# Patient Record
Sex: Female | Born: 1935
Health system: Southern US, Community
[De-identification: ages and names within clinical notes are randomized; demographics above are authoritative.]

## PROBLEM LIST (undated history)

## (undated) DIAGNOSIS — T148XXA Other injury of unspecified body region, initial encounter: Secondary | ICD-10-CM

## (undated) DIAGNOSIS — K219 Gastro-esophageal reflux disease without esophagitis: Secondary | ICD-10-CM

## (undated) DIAGNOSIS — S42002A Fracture of unspecified part of left clavicle, initial encounter for closed fracture: Secondary | ICD-10-CM

## (undated) DIAGNOSIS — J8410 Pulmonary fibrosis, unspecified: Secondary | ICD-10-CM

## (undated) DIAGNOSIS — I10 Essential (primary) hypertension: Secondary | ICD-10-CM

## (undated) HISTORY — PX: OTHER SURGICAL HISTORY: SHX169

## (undated) HISTORY — DX: Essential (primary) hypertension: I10

## (undated) HISTORY — PX: FEMUR FRACTURE SURGERY: SHX633

## (undated) HISTORY — PX: HIP FRACTURE SURGERY: SHX118

## (undated) HISTORY — PX: HIP SURGERY: SHX245

---

## 1998-10-09 ENCOUNTER — Other Ambulatory Visit: Admission: RE | Admit: 1998-10-09 | Discharge: 1998-10-09 | Payer: Self-pay | Admitting: Obstetrics and Gynecology

## 1998-11-01 ENCOUNTER — Encounter: Payer: Self-pay | Admitting: Obstetrics and Gynecology

## 1998-11-01 ENCOUNTER — Ambulatory Visit (HOSPITAL_COMMUNITY): Admission: RE | Admit: 1998-11-01 | Discharge: 1998-11-01 | Payer: Self-pay | Admitting: Obstetrics and Gynecology

## 1999-11-19 ENCOUNTER — Other Ambulatory Visit: Admission: RE | Admit: 1999-11-19 | Discharge: 1999-11-19 | Payer: Self-pay | Admitting: Obstetrics and Gynecology

## 2000-07-31 ENCOUNTER — Encounter: Payer: Self-pay | Admitting: Obstetrics and Gynecology

## 2000-07-31 ENCOUNTER — Ambulatory Visit (HOSPITAL_COMMUNITY): Admission: RE | Admit: 2000-07-31 | Discharge: 2000-07-31 | Payer: Self-pay | Admitting: Obstetrics and Gynecology

## 2000-08-14 ENCOUNTER — Encounter: Payer: Self-pay | Admitting: Orthopaedic Surgery

## 2000-08-14 ENCOUNTER — Encounter: Admission: RE | Admit: 2000-08-14 | Discharge: 2000-08-14 | Payer: Self-pay | Admitting: Orthopaedic Surgery

## 2001-02-05 ENCOUNTER — Other Ambulatory Visit: Admission: RE | Admit: 2001-02-05 | Discharge: 2001-02-05 | Payer: Self-pay | Admitting: Obstetrics and Gynecology

## 2002-10-20 ENCOUNTER — Other Ambulatory Visit: Admission: RE | Admit: 2002-10-20 | Discharge: 2002-10-20 | Payer: Self-pay | Admitting: *Deleted

## 2002-10-21 ENCOUNTER — Ambulatory Visit (HOSPITAL_COMMUNITY): Admission: RE | Admit: 2002-10-21 | Discharge: 2002-10-21 | Payer: Self-pay | Admitting: *Deleted

## 2003-07-11 ENCOUNTER — Encounter (INDEPENDENT_AMBULATORY_CARE_PROVIDER_SITE_OTHER): Payer: Self-pay | Admitting: Specialist

## 2003-07-11 ENCOUNTER — Ambulatory Visit (HOSPITAL_COMMUNITY): Admission: RE | Admit: 2003-07-11 | Discharge: 2003-07-11 | Payer: Self-pay | Admitting: Gastroenterology

## 2005-05-02 ENCOUNTER — Ambulatory Visit: Payer: Self-pay | Admitting: Hematology & Oncology

## 2005-07-10 ENCOUNTER — Ambulatory Visit: Payer: Self-pay | Admitting: Hematology & Oncology

## 2005-09-02 ENCOUNTER — Ambulatory Visit: Payer: Self-pay | Admitting: Hematology & Oncology

## 2005-11-19 ENCOUNTER — Ambulatory Visit (HOSPITAL_COMMUNITY): Admission: RE | Admit: 2005-11-19 | Discharge: 2005-11-19 | Payer: Self-pay | Admitting: Internal Medicine

## 2005-12-10 ENCOUNTER — Ambulatory Visit: Payer: Self-pay | Admitting: Hematology & Oncology

## 2005-12-10 ENCOUNTER — Ambulatory Visit (HOSPITAL_COMMUNITY): Admission: RE | Admit: 2005-12-10 | Discharge: 2005-12-10 | Payer: Self-pay | Admitting: Hematology & Oncology

## 2005-12-19 ENCOUNTER — Encounter: Admission: RE | Admit: 2005-12-19 | Discharge: 2005-12-19 | Payer: Self-pay | Admitting: Internal Medicine

## 2005-12-31 ENCOUNTER — Ambulatory Visit (HOSPITAL_COMMUNITY): Admission: RE | Admit: 2005-12-31 | Discharge: 2005-12-31 | Payer: Self-pay | Admitting: Internal Medicine

## 2006-01-06 ENCOUNTER — Encounter: Admission: RE | Admit: 2006-01-06 | Discharge: 2006-01-06 | Payer: Self-pay | Admitting: Internal Medicine

## 2006-07-17 ENCOUNTER — Encounter: Admission: RE | Admit: 2006-07-17 | Discharge: 2006-07-17 | Payer: Self-pay | Admitting: Internal Medicine

## 2006-09-08 ENCOUNTER — Ambulatory Visit: Payer: Self-pay | Admitting: Pulmonary Disease

## 2006-10-08 ENCOUNTER — Ambulatory Visit: Payer: Self-pay | Admitting: Pulmonary Disease

## 2006-11-19 ENCOUNTER — Ambulatory Visit: Payer: Self-pay | Admitting: Pulmonary Disease

## 2006-12-31 ENCOUNTER — Ambulatory Visit: Payer: Self-pay | Admitting: Pulmonary Disease

## 2006-12-31 ENCOUNTER — Ambulatory Visit: Payer: Self-pay | Admitting: Cardiology

## 2007-03-03 ENCOUNTER — Encounter: Admission: RE | Admit: 2007-03-03 | Discharge: 2007-03-03 | Payer: Self-pay | Admitting: Internal Medicine

## 2007-07-14 ENCOUNTER — Ambulatory Visit (HOSPITAL_COMMUNITY): Admission: RE | Admit: 2007-07-14 | Discharge: 2007-07-14 | Payer: Self-pay | Admitting: Internal Medicine

## 2007-11-12 ENCOUNTER — Encounter: Payer: Self-pay | Admitting: Pulmonary Disease

## 2007-11-12 DIAGNOSIS — E119 Type 2 diabetes mellitus without complications: Secondary | ICD-10-CM | POA: Insufficient documentation

## 2007-11-12 DIAGNOSIS — E1159 Type 2 diabetes mellitus with other circulatory complications: Secondary | ICD-10-CM | POA: Insufficient documentation

## 2007-11-12 DIAGNOSIS — I152 Hypertension secondary to endocrine disorders: Secondary | ICD-10-CM | POA: Insufficient documentation

## 2007-11-12 DIAGNOSIS — I10 Essential (primary) hypertension: Secondary | ICD-10-CM | POA: Insufficient documentation

## 2008-12-22 ENCOUNTER — Encounter: Admission: RE | Admit: 2008-12-22 | Discharge: 2008-12-22 | Payer: Self-pay | Admitting: Internal Medicine

## 2009-01-19 ENCOUNTER — Ambulatory Visit (HOSPITAL_COMMUNITY): Admission: RE | Admit: 2009-01-19 | Discharge: 2009-01-19 | Payer: Self-pay | Admitting: Internal Medicine

## 2010-02-05 ENCOUNTER — Ambulatory Visit (HOSPITAL_COMMUNITY): Admission: RE | Admit: 2010-02-05 | Discharge: 2010-02-05 | Payer: Self-pay | Admitting: Internal Medicine

## 2011-04-18 NOTE — Assessment & Plan Note (Signed)
[facility]                               PULMONARY OFFICE NOTE   SHEENAH, DIMITROFF                    MRN:          [id number]  DATE:10/08/2006                            DOB:          1927-11-25    SUBJECTIVE:  Ms. Gitto comes in today for followup of her cough and also  her mild interstitial lung disease.  The patient states that her cough is  greater than 50% improved since being on b.i.d. proton pump inhibitor.  She  does still have it at times.  I have also gone over the patient's recent  blood work where her ANA, rheumatoid factor and sed rate were all  unremarkable.   PHYSICAL EXAMINATION:  BP is 106/70.  Pulse 88.  Temperature 98.1.  Weight  is 150 pounds.  02 saturation on room air is 94%.  GENERAL:  She is a well-developed, white female, in no acute distress.   IMPRESSION:  1. Cough that is most likely due to occult laryngeal pharyngeal reflux and      some component of Cyclical mechanism.  Patient is clearly improved      greater than 50% since being on b.i.d. proton pump inhibitor.  I have      told her that I would like to continue this for another 1 to 2 months,      and if she is doing well, we will decrease her down to once a day on      the proton pump inhibitor.  2. Mild interstitial lung disease manifesting a subpleural fibrosis on CT      of the chest in January 2007.  This will need follow up in January of      2008 along with PFT's.   PLAN:  1. Stay on Protonix b.i.d. for now.  The patient will follow up with me in      6 weeks.  If she is doing well at that time with resolution of her      cough, I will decrease her q.d. dosing.  2. We will schedule at the next visit her CT scan and PFT's for January of      next year for followup of her interstitial lung disease.    ______________________________  Barbaraann Share, MD,FCCP    [facility]  DD: 10/08/2006  DT: 10/08/2006  Job #: [id number]   cc:   Gaspar Garbe, M.D.

## 2011-04-18 NOTE — Assessment & Plan Note (Signed)
[facility]                               PULMONARY OFFICE NOTE   Lauren, Avery                    MRN:          [id number]  DATE:09/08/2006                            DOB:          06-18-1927    PULMONARY CONSULTATION:   HISTORY OF PRESENT ILLNESS:  Patient is a 77 year old female whom I have  been asked to see for cough and also an abnormal chest CT.  Patient states  that she has had a dry cough over the last 9 to 12 months that occurs off  and on all day.  Patient states again that it is dry in nature with rare  scant white mucus.  She states that stimulants for her cough seems to be a  tickle in her throat area.  The patient states that it does not wake her up  during the night and it is no worse with lying down.  It may be worse with  eating but she is unsure of this.  It is clearly worse with singing, talking  and can often lead to cough paroxysms.  It is better if she keeps a cough  drop in her mouth.  She denies a lot of throat clearing.  Patient denies any  post nasal drips or difficulties with allergies.  She also denies classic  heartburn but does get epigastric burning at times.  She denies any  hoarseness.  She has had no shortness of breath with activities of daily  living and can walk 1 mile and bike 3 miles with no difficulties.  The  patient has no history of connective tissue diseases and only complains of  some mild arthritis changes in her hands.  She has had a CT scan of the  chest on December 18, 2005 that does show some very mild subpleural fibrotic  changes.  There is no ground glass densities or significant lymphadenopathy.   PAST MEDICAL HISTORY:  Is significant for hypertension.  She has been on  Verapamil in the past but now she has discontinued this on her own; also  diabetes for which she is on no medications.   CURRENT MEDICATIONS:  Consist of vitamins only at this time.   She has no known drug  allergies.   SOCIAL HISTORY:  She is married, she has never smoked.  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   FAMILY HISTORY:  Is remarkable for brother having had pancreatic cancer.   REVIEW OF SYSTEMS:  As per history of present illness.  Also patient intake  form in the back of the chart.   PHYSICAL EXAMINATION:  GENERAL:  She is a well-developed female in no acute  distress.  Blood pressure is 134/62, pulse 83.  Temperature is 97.5, weight is 151  pounds.  O2 saturation on room air is 94%.  HEENT:  Pupils equal, round and reactive to light and accommodation.  Extraocular muscles are intact.  Nares are patent without discharge.  Oropharynx is clear.  NECK:  Is supple without JVD or lymphadenopathy.  There is no palpable  thyromegaly.  CHEST:  Reveals bibasilar crackles.  CARDIAC:  Reveals regular rate and rhythm without murmurs, rubs or gallops.  ABDOMEN:  Soft, nontender with good bowel sounds.  RECTAL EXAM & BREAST EXAM:  Was not done and not indicated.  LOWER EXTREMITIES:  Are without edema.  Good pulses distally.  No calf  tenderness.  NEUROLOGICALLY:  Alert and oriented with no obvious motor deficits.   IMPRESSION:  1. Cough that sounds more upper airway in origin than lower airway.  She      definitely has an upper airway dysfunction-like description to her      symptoms.  It is unclear how much of this could be due to occult      laryngopharyngeal reflux, and it does not sound like it is related to      postnasal drip or allergic disease.  I think at this point in time I      would treat her empirically for LPR and also place her on a modified      cyclical cough regimen with voice rest, no throat clearing, and may      have to consider aggressive cough suppression with narcotics cough      syrup.  I do not think her cough is due to her fibrotic changes noted      on CT scan.  2. Subpleural fibrosis noted on CT that has the appearance of early usual       interstitial pneumonitis.  I will go ahead and check some screening      connective tissue labs.  If this is idiopathic pulmonary fibrosis,      unfortunately there is very little successful treatment.  I think this      needs to be closely monitored for worsening.   PLAN:  1. Modified cyclical cough protocol.  2. Protonix 40 mg p.o. b.i.d. as a trial.  3. Will check ANA, rheumatoid factor and sed rate.  4. Patient will need a follow up CT and also full pulmonary function tests      in January 2008.  Her last pulmonary function tests in January of last      year showed no obstruction or restriction and only a mild decrease in      DLCO.  5. The patient will follow up in approximately 4 to 6 weeks or sooner if      there is problems.    ______________________________  Barbaraann Share, MD,FCCP    [facility]  DD: 09/29/2006  DT: 09/29/2006  Job #: [id number]   cc:   Gaspar Garbe, M.D.

## 2011-04-18 NOTE — Op Note (Signed)
   NAME:  Lauren Avery, Lauren Avery                       ACCOUNT NO.:  [id number]   MEDICAL RECORD NO.:  [id number]                   PATIENT TYPE:  AMB   LOCATION:  ENDO                                 FACILITY:  [facility]   PHYSICIAN:  Graylin Shiver, M.D.                DATE OF BIRTH:  1927/09/15   DATE OF PROCEDURE:  07/11/2003  DATE OF DISCHARGE:                                 OPERATIVE REPORT   PROCEDURE:  Colonoscopy with biopsy.   INDICATIONS FOR PROCEDURE:  Rectal bleeding.   Informed consent was obtained after explanation of the risks of bleeding,  infection and perforation.   PREMEDICATION:  Fentanyl 80 mcg IV, Versed 8 mg IV   PROCEDURE:  With the patient in the left lateral decubitus position, a  rectal exam was performed and no masses were felt.  The Olympus pediatric  colonoscope was inserted into the rectum and advanced into the sigmoid  colon.  The sigmoid was quite tortuous.  The scope was able to be advanced  beyond the sigmoid and then around the colon to the cecum.  Cecal landmarks  were identified.  At the base of the cecum on a fold, there was a linear  ulcer and biopsies were obtained.  No other abnormalities were noted in the  cecum.  The ascending colon was normal.  The transverse colon was normal.  The descending colon and sigmoid revealed some scattered diverticula.  Rectum was normal.  She tolerated the procedure well without complications.   IMPRESSION:  1. Linear ulcer in the cecum.  This was biopsied.  2. Diverticulosis of the left colon.   COMMENTS:  The patient's recent rectal bleeding could have been from the  ulcer in the cecum or from diverticulosis.  There is no evidence of active  significant bleeding at this time.  The biopsies will be checked.  The  patient has not reported any further bleeding since her initial episode and  hopefully there will be no further bleeding.                                               Graylin Shiver, M.D.    Germain Osgood  D:  07/11/2003  T:  07/11/2003  Job:  [id number]   cc:   Gwen Pounds, M.D.  [address]  [place]  [place] [address]  Fax: [phone]

## 2012-01-08 ENCOUNTER — Other Ambulatory Visit (HOSPITAL_COMMUNITY): Payer: Self-pay | Admitting: *Deleted

## 2012-01-12 ENCOUNTER — Ambulatory Visit (HOSPITAL_COMMUNITY)
Admission: RE | Admit: 2012-01-12 | Discharge: 2012-01-12 | Disposition: A | Discharge status: Home / Self Care | Payer: Medicare Other | Source: Ambulatory Visit | Attending: Internal Medicine | Admitting: Internal Medicine

## 2012-01-12 ENCOUNTER — Encounter (HOSPITAL_COMMUNITY): Payer: Self-pay

## 2012-01-12 DIAGNOSIS — M810 Age-related osteoporosis without current pathological fracture: Secondary | ICD-10-CM | POA: Insufficient documentation

## 2012-01-12 HISTORY — DX: Fracture of unspecified part of left clavicle, initial encounter for closed fracture: S42.002A

## 2012-01-12 HISTORY — DX: Pulmonary fibrosis, unspecified: J84.10

## 2012-01-12 HISTORY — DX: Gastro-esophageal reflux disease without esophagitis: K21.9

## 2012-01-12 MED ORDER — SODIUM CHLORIDE 0.9 % IV SOLN
INTRAVENOUS | Status: AC
  Administered 2012-01-12: 13:00:00 via INTRAVENOUS

## 2012-01-12 MED ORDER — ZOLEDRONIC ACID 5 MG/100ML IV SOLN
5.0000 mg | Freq: Once | INTRAVENOUS | Status: AC
  Administered 2012-01-12: 5 mg via INTRAVENOUS
  Filled 2012-01-12: qty 100

## 2012-02-24 ENCOUNTER — Encounter: Payer: Self-pay | Admitting: Pulmonary Disease

## 2012-02-24 ENCOUNTER — Other Ambulatory Visit (INDEPENDENT_AMBULATORY_CARE_PROVIDER_SITE_OTHER): Payer: Medicare Other

## 2012-02-24 ENCOUNTER — Ambulatory Visit (INDEPENDENT_AMBULATORY_CARE_PROVIDER_SITE_OTHER): Payer: Medicare Other | Admitting: Pulmonary Disease

## 2012-02-24 DIAGNOSIS — R05 Cough: Secondary | ICD-10-CM

## 2012-02-24 DIAGNOSIS — J84112 Idiopathic pulmonary fibrosis: Secondary | ICD-10-CM | POA: Insufficient documentation

## 2012-02-24 DIAGNOSIS — R053 Chronic cough: Secondary | ICD-10-CM | POA: Insufficient documentation

## 2012-02-24 DIAGNOSIS — R059 Cough, unspecified: Secondary | ICD-10-CM

## 2012-02-24 LAB — SEDIMENTATION RATE: Sed Rate: 29 mm/hr — ABNORMAL HIGH (ref 0–22)

## 2012-02-24 LAB — CK: Total CK: 34 U/L (ref 7–177)

## 2012-02-24 MED ORDER — OMEPRAZOLE 40 MG PO CPDR: 40.0000 mg | DELAYED_RELEASE_CAPSULE | Freq: Every morning | ORAL | Status: DC

## 2012-02-24 MED ORDER — BENZONATATE 100 MG PO CAPS: 200.0000 mg | ORAL_CAPSULE | Freq: Four times a day (QID) | ORAL | Status: AC | PRN

## 2012-02-24 NOTE — Assessment & Plan Note (Signed)
The patient has a chronic cough of at least 5 years duration, and it is unclear from her history how much of that is due to her upper airway versus her known interstitial lung disease.  She tells me that she has definite reflux, and is unsure if she has postnasal drip.  Her history also suggest a component of cyclical coughing.  I would like to treat for laryngopharyngeal reflux, postnasal drip, and also tried behavioral therapy for cyclical coughing.  In the end, if this is primarily due to her interstitial lung disease, there is not a lot we can do other than cough suppression.

## 2012-02-24 NOTE — Assessment & Plan Note (Signed)
The patient's CAT scan appearance is most consistent with usual interstitial pneumonitis.  This can be related to an autoimmune process, or idiopathic pulmonary fibrosis.  The only history she has that may suggest an autoimmune disease is her arthritis, but more than likely her fibrosis will be idiopathic.  Will send an autoimmune panel, and also recheck pulmonary function studies to compare to 2007.  I have had a long discussion with her about interstitial lung disease, especially idiopathic pulmonary fibrosis.  I have explained to her there is no treatment currently for this disease except lung transplantation.  If there is a related autoimmune process, the disease carries a better prognosis and may be treatable.

## 2012-02-24 NOTE — Patient Instructions (Addendum)
Stop all inhalers. Start omeprazole 40mg  one each am before breakfast for possible acid reflux Take zyrtec 10mg  otc each night at bedtime Can use tessalon pearls 2 every 6hrs if needed for cough NO THROAT CLEARING, use hard candy to bathe back of throat if it tickles. Will schedule for breathing studies, and see you back same day for review (in 2-3 weeks) Will send bloodwork to look for autoimmune disease.

## 2012-02-24 NOTE — Progress Notes (Signed)
  Subjective:    Patient ID: Lauren Avery, female    DOB: [birth year], 79 y.o.   MRN: [id number]  HPI The patient is a very pleasant 78 year old female who I've been asked to see for an abnormal CT chest.  The patient has a history of cough for at least 5 years, and was also noted to have interstitial lung disease in the past.  Her cough has been getting worse, and she is been treated with various cough syrups and inhalers without improvement.  It is primarily dry in nature, and can occasionally result in cough paroxysms.  She does have postnasal drip as well as significant reflux symptoms.  She has noticed that her breathing has been getting worse, and will get winded bringing groceries in from the car or walking up one flight of stairs slowly.  She'll also get short of breath doing light housework.  The patient has had a chest x-ray which showed worsening interstitial disease from 2007, and this was followed by a CT chest which shows classic changes for usual interstitial pneumonitis.  She has subpleural fibrosis worse in the bases, honeycombing, and no groundglass opacities.  She did have very small nodules of unknown significance. The patient denies any occupational exposures, nor unusual pets or hobbies.  She has never been on Macrodantin for a prolonged period of time or amiodarone.  She has arthritic changes in her hands, but denies any known autoimmune disease.  She has no symptoms to suggest chronic aspiration.  The patient has never smoked.   Review of Systems  Constitutional: Negative for fever and unexpected weight change.  HENT: Positive for sneezing. Negative for ear pain, nosebleeds, congestion, sore throat, rhinorrhea, trouble swallowing, dental problem, postnasal drip and sinus pressure.   Eyes: Negative for redness and itching.  Respiratory: Positive for cough and shortness of breath. Negative for chest tightness and wheezing.   Cardiovascular: Negative for palpitations and leg  swelling.  Gastrointestinal: Negative for nausea and vomiting.  Genitourinary: Negative for dysuria.  Musculoskeletal: Positive for joint swelling.  Skin: Negative for rash.  Neurological: Negative for headaches.  Hematological: Does not bruise/bleed easily.  Psychiatric/Behavioral: Negative for dysphoric mood. The patient is not nervous/anxious.        Objective:   Physical Exam Constitutional:  Well developed, no acute distress  HENT:  Nares patent without discharge  Oropharynx without exudate, palate and uvula are normal  Eyes:  Perrla, eomi, no scleral icterus  Neck:  No JVD, no TMG  Cardiovascular:  Normal rate, regular rhythm, no rubs or gallops.  No murmurs        Intact distal pulses  Pulmonary :  Normal breath sounds, no stridor or respiratory distress   Crackles 1/3 up bilat, no wheezing  Abdominal:  Soft, nondistended, bowel sounds present.  No tenderness noted.   Musculoskeletal:  minimal lower extremity edema noted.  Lymph Nodes:  No cervical lymphadenopathy noted  Skin:  No cyanosis noted  Neurologic:  Alert, appropriate, moves all 4 extremities without obvious deficit.         Assessment & Plan:

## 2012-02-25 LAB — ANTI-NUCLEAR AB-TITER (ANA TITER): ANA Titer 1: NEGATIVE

## 2012-02-25 LAB — JO-1 ANTIBODY-IGG: Jo-1 Antibody, IgG: <1 AU/mL (ref <30)

## 2012-02-25 LAB — ANA: Anti Nuclear Antibody(ANA): POSITIVE — AB

## 2012-02-25 LAB — ANTI-RIBONUCLEIC ACID ANTIBODY: SM/RNP: <1 AU/mL (ref <30)

## 2012-02-25 LAB — C-REACTIVE PROTEIN: CRP: 0.37 mg/dL (ref <0.60)

## 2012-02-25 LAB — CYCLIC CITRUL PEPTIDE ANTIBODY, IGG: Cyclic Citrullin Peptide Ab: <2 U/mL (ref 0.0–5.0)

## 2012-02-25 LAB — RHEUMATOID FACTOR: Rhuematoid fact SerPl-aCnc: 19 IU/mL — ABNORMAL HIGH (ref <=14)

## 2012-02-25 LAB — ANTI-SCLERODERMA ANTIBODY: Scleroderma (Scl-70) (ENA) Antibody, IgG: 1 AU/mL (ref <30)

## 2012-03-16 ENCOUNTER — Ambulatory Visit (INDEPENDENT_AMBULATORY_CARE_PROVIDER_SITE_OTHER): Payer: Medicare Other | Admitting: Pulmonary Disease

## 2012-03-16 ENCOUNTER — Encounter: Payer: Self-pay | Admitting: Pulmonary Disease

## 2012-03-16 DIAGNOSIS — J84112 Idiopathic pulmonary fibrosis: Secondary | ICD-10-CM

## 2012-03-16 DIAGNOSIS — R059 Cough, unspecified: Secondary | ICD-10-CM

## 2012-03-16 DIAGNOSIS — R05 Cough: Secondary | ICD-10-CM

## 2012-03-16 DIAGNOSIS — R053 Chronic cough: Secondary | ICD-10-CM

## 2012-03-16 LAB — PULMONARY FUNCTION TEST

## 2012-03-16 MED ORDER — OMEPRAZOLE 20 MG PO CPDR: DELAYED_RELEASE_CAPSULE | ORAL | Status: DC

## 2012-03-16 NOTE — Assessment & Plan Note (Signed)
The patient's autoimmune workup was unrevealing, and therefore she is likely to have idiopathic pulmonary fibrosis.  Her PFTs today showed no obstruction, no restriction, and a moderate decrease in diffusion capacity.  Her lung volumes are basically unchanged from 2007, but her diffusion capacity has mild to moderately decreased since that time.  At this point, there is really nothing else to offer her treatment-wise, but I have encouraged her to work aggressively on some type of exercise program.  If she is not able to do it on her own at home, would refer to pulmonary rehabilitation at [facility].

## 2012-03-16 NOTE — Assessment & Plan Note (Signed)
The patient's cough is almost totally resolved with treatment of postnasal drip and acid reflux.  We'll try and cut back on some of her medication, but if the cough returns, she will need to go back to more aggressive treatment as outlined last visit.  I've also encouraged her not to clear her throat.

## 2012-03-16 NOTE — Patient Instructions (Signed)
Will decrease omeprazole to 20mg  each day.  Hopefully insurance will cover this. Take zyrtec for postnasal drip as needed rather than everyday. If your cough returns, would start by taking zyrtec everyday.  If it persists, may have to increase your reflux medication back to 40mg . Avoid throat clearing no matter what. Stay as active as possible, and consider exercise program.  Will refer to pulmonary rehab program [facility] if you wish to attend followup with me in 6mos, but call if your breathing is worsening.

## 2012-03-16 NOTE — Progress Notes (Signed)
  Subjective:    Patient ID: Lauren Avery, female    DOB: June 02, 1927, 78 y.o.   MRN: [id number]  HPI The patient comes in today for followup of her known interstitial lung disease.  She had an autoimmune workup at the last visit, and this was unrevealing.  She has just had pulmonary function studies, and when compared to 2007, shows stable lung volumes and mild to moderate worsening of her diffusion capacity.  I have reviewed the study with her in detail, and answered all of her questions.   Review of Systems  Constitutional: Negative.   HENT: Negative.   Eyes: Negative.   Respiratory: Positive for cough and shortness of breath.   Cardiovascular: Negative.   Gastrointestinal: Negative.   Genitourinary: Negative.   Musculoskeletal: Negative.   Skin: Negative.   Neurological: Negative.   Hematological: Negative.   Psychiatric/Behavioral: Negative.        Objective:   Physical Exam Well-developed female in no acute distress Nose without purulence or discharge noted Chest with basilar crackles, no wheezing Lower extremities with minimal edema, no cyanosis Alert and oriented, moves all 4 extremities.       Assessment & Plan:

## 2012-03-16 NOTE — Progress Notes (Signed)
PFT done today. 

## 2012-03-19 ENCOUNTER — Encounter: Payer: Self-pay | Admitting: Pulmonary Disease

## 2012-09-15 ENCOUNTER — Encounter: Payer: Self-pay | Admitting: Pulmonary Disease

## 2012-09-15 ENCOUNTER — Ambulatory Visit (INDEPENDENT_AMBULATORY_CARE_PROVIDER_SITE_OTHER): Payer: Medicare Other | Admitting: Pulmonary Disease

## 2012-09-15 DIAGNOSIS — R053 Chronic cough: Secondary | ICD-10-CM

## 2012-09-15 DIAGNOSIS — R05 Cough: Secondary | ICD-10-CM

## 2012-09-15 DIAGNOSIS — R059 Cough, unspecified: Secondary | ICD-10-CM

## 2012-09-15 DIAGNOSIS — Z23 Encounter for immunization: Secondary | ICD-10-CM

## 2012-09-15 DIAGNOSIS — J84112 Idiopathic pulmonary fibrosis: Secondary | ICD-10-CM

## 2012-09-15 NOTE — Assessment & Plan Note (Signed)
The patient appears to be stable from a pulmonary fibrosis standpoint.  She is seen no decline in her functional status since the last visit.  At this point, I have asked her to try and work on weight reduction as well as some type of exercise program.  She has not heard from the pulmonary rehabilitation program, and I will asked them to call her again.  I would like to see her back in 6 months, and do her yearly chest x-ray and PFTs.

## 2012-09-15 NOTE — Patient Instructions (Addendum)
Will give you the flu shot today Stay on medication for acid reflux, but if you continue to have symptoms, would discuss with your primary doctor. Try taking zyrtec (generic is cetirizine) one at bedtime each night to see if helps postnasal drip and am cough. Will see you back in 6mos, and do chest xray and breathing studies the same day.

## 2012-09-15 NOTE — Assessment & Plan Note (Signed)
The patient feels that her cough is better, but continues to be an issue after eating and also first thing in the morning upon arising.  She is staying on her proton pump inhibitor, but admits that she has breakthrough reflux symptoms.  I have asked her to discuss this with her primary care physician.  She also is not taking an antihistamine at bedtime, and postnasal drip is a very common cause of cough first thing in the mornings.

## 2012-09-15 NOTE — Progress Notes (Signed)
  Subjective:    Patient ID: Lauren Avery, female    DOB: 11-Jun-1927, 78 y.o.   MRN: [id number]  HPI The patient comes in today for followup of her known idiopathic pulmonary fibrosis.  She feels that her exertional tolerance is at baseline, and has not been having worsening shortness of breath.  Her cough which I believe is more upper airway than lower is better, but she is still having it primarily after eating and first thing in the morning upon arising.  She admits that she is having some breakthrough reflux symptoms despite taking low-dose proton pump inhibitor.  She also is not taking an antihistamine at bedtime as I previously recommended.   Review of Systems  Constitutional: Negative for fever and unexpected weight change.  HENT: Negative for ear pain, nosebleeds, congestion, sore throat, rhinorrhea, sneezing, trouble swallowing, dental problem, postnasal drip and sinus pressure.   Eyes: Positive for itching. Negative for redness.  Respiratory: Positive for cough and wheezing. Negative for chest tightness. Shortness of breath:  upon activity    Cardiovascular: Negative for palpitations and leg swelling.  Gastrointestinal: Negative for nausea and vomiting.  Genitourinary: Negative for dysuria.  Musculoskeletal: Negative for joint swelling.  Skin: Negative for rash.  Neurological: Negative for headaches.  Hematological: Does not bruise/bleed easily.  Psychiatric/Behavioral: Positive for dysphoric mood ( just loss husband ). The patient is nervous/anxious.        Objective:   Physical Exam Well-developed female in no acute distress Nose without purulence or discharge noted Chest with crackles one third the way up bilaterally, no wheezing Cardiac exam with regular rate and rhythm Lower extremities with no significant edema, no cyanosis Alert and oriented, moves all 4 extremities.       Assessment & Plan:

## 2012-10-31 DIAGNOSIS — T148XXA Other injury of unspecified body region, initial encounter: Secondary | ICD-10-CM

## 2012-10-31 HISTORY — DX: Other injury of unspecified body region, initial encounter: T14.8XXA

## 2012-11-18 ENCOUNTER — Other Ambulatory Visit: Payer: Self-pay | Admitting: Orthopaedic Surgery

## 2012-11-18 ENCOUNTER — Ambulatory Visit
Admission: RE | Admit: 2012-11-18 | Discharge: 2012-11-18 | Disposition: A | Discharge status: Home / Self Care | Payer: Medicare Other | Source: Ambulatory Visit | Attending: Orthopaedic Surgery | Admitting: Orthopaedic Surgery

## 2012-11-18 DIAGNOSIS — R52 Pain, unspecified: Secondary | ICD-10-CM

## 2012-11-18 DIAGNOSIS — T148XXA Other injury of unspecified body region, initial encounter: Secondary | ICD-10-CM

## 2012-12-14 ENCOUNTER — Ambulatory Visit (HOSPITAL_COMMUNITY): Payer: Medicare Other

## 2013-01-27 ENCOUNTER — Other Ambulatory Visit (HOSPITAL_COMMUNITY): Payer: Self-pay | Admitting: Internal Medicine

## 2013-01-28 ENCOUNTER — Telehealth: Payer: Self-pay | Admitting: Pulmonary Disease

## 2013-01-28 ENCOUNTER — Other Ambulatory Visit: Payer: Self-pay | Admitting: Pulmonary Disease

## 2013-01-28 MED ORDER — OMEPRAZOLE 20 MG PO CPDR: DELAYED_RELEASE_CAPSULE | ORAL | Status: DC

## 2013-01-28 NOTE — Telephone Encounter (Signed)
I spoke with pt daughter and request refill on omeprazole. rx has been sent. Nothing further was needed

## 2013-01-31 ENCOUNTER — Encounter (HOSPITAL_COMMUNITY): Payer: Self-pay

## 2013-01-31 ENCOUNTER — Ambulatory Visit (HOSPITAL_COMMUNITY)
Admission: RE | Admit: 2013-01-31 | Discharge: 2013-01-31 | Disposition: A | Discharge status: Home / Self Care | Payer: Medicare Other | Source: Ambulatory Visit | Attending: Internal Medicine | Admitting: Internal Medicine

## 2013-01-31 DIAGNOSIS — M810 Age-related osteoporosis without current pathological fracture: Secondary | ICD-10-CM | POA: Insufficient documentation

## 2013-01-31 HISTORY — DX: Other injury of unspecified body region, initial encounter: T14.8XXA

## 2013-01-31 MED ORDER — ZOLEDRONIC ACID 5 MG/100ML IV SOLN
5.0000 mg | Freq: Once | INTRAVENOUS | Status: AC
  Administered 2013-01-31: 5 mg via INTRAVENOUS

## 2013-01-31 MED ORDER — SODIUM CHLORIDE 0.9 % IV SOLN
INTRAVENOUS | Status: DC
  Administered 2013-01-31: 10:00:00 via INTRAVENOUS

## 2013-02-15 ENCOUNTER — Other Ambulatory Visit (HOSPITAL_COMMUNITY): Payer: Self-pay | Admitting: Orthopaedic Surgery

## 2013-02-15 DIAGNOSIS — M25551 Pain in right hip: Secondary | ICD-10-CM

## 2013-02-17 ENCOUNTER — Emergency Department (HOSPITAL_COMMUNITY): Payer: Medicare Other

## 2013-02-17 ENCOUNTER — Encounter (HOSPITAL_COMMUNITY): Payer: Self-pay | Admitting: *Deleted

## 2013-02-17 ENCOUNTER — Inpatient Hospital Stay (HOSPITAL_COMMUNITY)
Admission: EM | Admit: 2013-02-17 | Discharge: 2013-02-21 | DRG: 493 | Disposition: A | Discharge status: Skilled Nursing Facility | Payer: Medicare Other | Attending: Internal Medicine | Admitting: Internal Medicine

## 2013-02-17 DIAGNOSIS — N183 Chronic kidney disease, stage 3 unspecified: Secondary | ICD-10-CM | POA: Diagnosis present

## 2013-02-17 DIAGNOSIS — E1129 Type 2 diabetes mellitus with other diabetic kidney complication: Secondary | ICD-10-CM | POA: Diagnosis present

## 2013-02-17 DIAGNOSIS — S3210XA Unspecified fracture of sacrum, initial encounter for closed fracture: Secondary | ICD-10-CM

## 2013-02-17 DIAGNOSIS — I129 Hypertensive chronic kidney disease with stage 1 through stage 4 chronic kidney disease, or unspecified chronic kidney disease: Secondary | ICD-10-CM | POA: Diagnosis present

## 2013-02-17 DIAGNOSIS — K219 Gastro-esophageal reflux disease without esophagitis: Secondary | ICD-10-CM | POA: Diagnosis present

## 2013-02-17 DIAGNOSIS — S42401A Unspecified fracture of lower end of right humerus, initial encounter for closed fracture: Secondary | ICD-10-CM

## 2013-02-17 DIAGNOSIS — Z79899 Other long term (current) drug therapy: Secondary | ICD-10-CM

## 2013-02-17 DIAGNOSIS — M8448XA Pathological fracture, other site, initial encounter for fracture: Secondary | ICD-10-CM

## 2013-02-17 DIAGNOSIS — S32509A Unspecified fracture of unspecified pubis, initial encounter for closed fracture: Secondary | ICD-10-CM

## 2013-02-17 DIAGNOSIS — S42409A Unspecified fracture of lower end of unspecified humerus, initial encounter for closed fracture: Principal | ICD-10-CM | POA: Diagnosis present

## 2013-02-17 DIAGNOSIS — S32592A Other specified fracture of left pubis, initial encounter for closed fracture: Secondary | ICD-10-CM

## 2013-02-17 DIAGNOSIS — W010XXA Fall on same level from slipping, tripping and stumbling without subsequent striking against object, initial encounter: Secondary | ICD-10-CM | POA: Diagnosis present

## 2013-02-17 DIAGNOSIS — J8410 Pulmonary fibrosis, unspecified: Secondary | ICD-10-CM | POA: Diagnosis present

## 2013-02-17 DIAGNOSIS — Y998 Other external cause status: Secondary | ICD-10-CM

## 2013-02-17 DIAGNOSIS — Z96649 Presence of unspecified artificial hip joint: Secondary | ICD-10-CM

## 2013-02-17 DIAGNOSIS — M810 Age-related osteoporosis without current pathological fracture: Secondary | ICD-10-CM | POA: Diagnosis present

## 2013-02-17 DIAGNOSIS — S42461A Displaced fracture of medial condyle of right humerus, initial encounter for closed fracture: Secondary | ICD-10-CM

## 2013-02-17 LAB — CBC WITH DIFFERENTIAL/PLATELET
Basophils Absolute: 0 10*3/uL (ref 0.0–0.1)
Basophils Relative: 0 % (ref 0–1)
Eosinophils Absolute: 0 10*3/uL (ref 0.0–0.7)
Eosinophils Relative: 0 % (ref 0–5)
HCT: 35.5 % — ABNORMAL LOW (ref 36.0–46.0)
Hemoglobin: 12.7 g/dL (ref 12.0–15.0)
Lymphocytes Relative: 9 % — ABNORMAL LOW (ref 12–46)
Lymphs Abs: 1.1 10*3/uL (ref 0.7–4.0)
MCH: 30 pg (ref 26.0–34.0)
MCHC: 35.8 g/dL (ref 30.0–36.0)
MCV: 83.7 fL (ref 78.0–100.0)
Monocytes Absolute: 0.6 10*3/uL (ref 0.1–1.0)
Monocytes Relative: 5 % (ref 3–12)
Neutro Abs: 10.2 10*3/uL — ABNORMAL HIGH (ref 1.7–7.7)
Neutrophils Relative %: 86 % — ABNORMAL HIGH (ref 43–77)
Platelets: 100 10*3/uL — ABNORMAL LOW (ref 150–400)
RBC: 4.24 MIL/uL (ref 3.87–5.11)
RDW: 13.6 % (ref 11.5–15.5)
WBC: 11.9 10*3/uL — ABNORMAL HIGH (ref 4.0–10.5)

## 2013-02-17 LAB — URINALYSIS, ROUTINE W REFLEX MICROSCOPIC
Bilirubin Urine: NEGATIVE
Glucose, UA: NEGATIVE mg/dL
Hgb urine dipstick: NEGATIVE
Ketones, ur: 40 mg/dL — AB
Leukocytes, UA: NEGATIVE
Nitrite: NEGATIVE
Protein, ur: NEGATIVE mg/dL
Specific Gravity, Urine: 1.02 (ref 1.005–1.030)
Urobilinogen, UA: 0.2 mg/dL (ref 0.0–1.0)
pH: 8 (ref 5.0–8.0)

## 2013-02-17 LAB — BASIC METABOLIC PANEL
BUN: 12 mg/dL (ref 6–23)
CO2: 22 mEq/L (ref 19–32)
Calcium: 9.8 mg/dL (ref 8.4–10.5)
Chloride: 103 mEq/L (ref 96–112)
Creatinine, Ser: 0.77 mg/dL (ref 0.50–1.10)
GFR calc Af Amer: 87 mL/min — ABNORMAL LOW (ref >90)
GFR calc non Af Amer: 75 mL/min — ABNORMAL LOW (ref >90)
Glucose, Bld: 176 mg/dL — ABNORMAL HIGH (ref 70–99)
Potassium: 4.2 mEq/L (ref 3.5–5.1)
Sodium: 139 mEq/L (ref 135–145)

## 2013-02-17 LAB — GLUCOSE, CAPILLARY: Glucose-Capillary: 176 mg/dL — ABNORMAL HIGH (ref 70–99)

## 2013-02-17 MED ORDER — LORATADINE 10 MG PO TABS
10.0000 mg | ORAL_TABLET | Freq: Every day | ORAL | Status: DC
  Administered 2013-02-18 – 2013-02-21 (×4): 10 mg via ORAL
  Filled 2013-02-17 (×4): qty 1

## 2013-02-17 MED ORDER — BISACODYL 10 MG RE SUPP: 10.0000 mg | Freq: Every day | RECTAL | Status: DC | PRN

## 2013-02-17 MED ORDER — OXYCODONE HCL 5 MG PO TABS
5.0000 mg | ORAL_TABLET | ORAL | Status: DC | PRN
  Administered 2013-02-17 – 2013-02-18 (×2): 5 mg via ORAL
  Filled 2013-02-17 (×2): qty 1

## 2013-02-17 MED ORDER — ACETAMINOPHEN 325 MG PO TABS
650.0000 mg | ORAL_TABLET | Freq: Four times a day (QID) | ORAL | Status: DC | PRN
  Administered 2013-02-17 – 2013-02-20 (×5): 650 mg via ORAL
  Filled 2013-02-17 (×5): qty 2

## 2013-02-17 MED ORDER — CALCIUM CARBONATE-VITAMIN D 500-200 MG-UNIT PO TABS
1.0000 | ORAL_TABLET | Freq: Two times a day (BID) | ORAL | Status: DC
  Administered 2013-02-18 – 2013-02-21 (×7): 1 via ORAL
  Filled 2013-02-17 (×8): qty 1

## 2013-02-17 MED ORDER — FLEET ENEMA 7-19 GM/118ML RE ENEM: 1.0000 | ENEMA | Freq: Once | RECTAL | Status: AC | PRN

## 2013-02-17 MED ORDER — PANTOPRAZOLE SODIUM 40 MG PO TBEC
40.0000 mg | DELAYED_RELEASE_TABLET | Freq: Every day | ORAL | Status: DC
  Administered 2013-02-18 – 2013-02-21 (×4): 40 mg via ORAL
  Filled 2013-02-17 (×4): qty 1

## 2013-02-17 MED ORDER — MORPHINE SULFATE 2 MG/ML IJ SOLN
2.0000 mg | INTRAMUSCULAR | Status: DC | PRN
  Administered 2013-02-18 – 2013-02-19 (×5): 2 mg via INTRAVENOUS
  Filled 2013-02-17 (×6): qty 1

## 2013-02-17 MED ORDER — SENNA 8.6 MG PO TABS
1.0000 | ORAL_TABLET | Freq: Two times a day (BID) | ORAL | Status: DC
  Administered 2013-02-18 – 2013-02-21 (×6): 8.6 mg via ORAL
  Filled 2013-02-17 (×8): qty 1

## 2013-02-17 MED ORDER — CALCIUM CITRATE-VITAMIN D 315-200 MG-UNIT PO TABS: 1.0000 | ORAL_TABLET | Freq: Two times a day (BID) | ORAL | Status: DC

## 2013-02-17 MED ORDER — FENTANYL CITRATE 0.05 MG/ML IJ SOLN
12.5000 ug | INTRAMUSCULAR | Status: AC | PRN
  Administered 2013-02-17 (×2): 12.5 ug via NASAL
  Filled 2013-02-17: qty 2

## 2013-02-17 MED ORDER — ACETAMINOPHEN 650 MG RE SUPP: 650.0000 mg | Freq: Four times a day (QID) | RECTAL | Status: DC | PRN

## 2013-02-17 MED ORDER — LOSARTAN POTASSIUM 50 MG PO TABS
50.0000 mg | ORAL_TABLET | Freq: Every day | ORAL | Status: DC
  Administered 2013-02-18 – 2013-02-21 (×3): 50 mg via ORAL
  Filled 2013-02-17 (×4): qty 1

## 2013-02-17 MED ORDER — ENOXAPARIN SODIUM 40 MG/0.4ML ~~LOC~~ SOLN
40.0000 mg | SUBCUTANEOUS | Status: DC
  Administered 2013-02-19 – 2013-02-21 (×3): 40 mg via SUBCUTANEOUS
  Filled 2013-02-17 (×4): qty 0.4

## 2013-02-17 MED ORDER — INSULIN ASPART 100 UNIT/ML ~~LOC~~ SOLN
0.0000 [IU] | Freq: Every day | SUBCUTANEOUS | Status: DC
  Administered 2013-02-17: 3 [IU] via SUBCUTANEOUS
  Administered 2013-02-18: 2 [IU] via SUBCUTANEOUS

## 2013-02-17 MED ORDER — AMLODIPINE BESYLATE 5 MG PO TABS
5.0000 mg | ORAL_TABLET | Freq: Every day | ORAL | Status: DC
  Administered 2013-02-20 – 2013-02-21 (×2): 5 mg via ORAL
  Filled 2013-02-17 (×4): qty 1

## 2013-02-17 MED ORDER — FENTANYL CITRATE 0.05 MG/ML IJ SOLN
12.5000 ug | Freq: Once | INTRAMUSCULAR | Status: AC
  Administered 2013-02-17: 12.5 ug via INTRAMUSCULAR
  Filled 2013-02-17: qty 2

## 2013-02-17 MED ORDER — INSULIN ASPART 100 UNIT/ML ~~LOC~~ SOLN
0.0000 [IU] | Freq: Three times a day (TID) | SUBCUTANEOUS | Status: DC
  Administered 2013-02-18: 2 [IU] via SUBCUTANEOUS
  Administered 2013-02-19: 100 [IU] via SUBCUTANEOUS
  Administered 2013-02-19: 3 [IU] via SUBCUTANEOUS
  Administered 2013-02-19 – 2013-02-20 (×3): 2 [IU] via SUBCUTANEOUS
  Administered 2013-02-21: 3 [IU] via SUBCUTANEOUS
  Administered 2013-02-21: 2 [IU] via SUBCUTANEOUS

## 2013-02-17 MED ORDER — FENTANYL CITRATE 0.05 MG/ML IJ SOLN
12.5000 ug | INTRAMUSCULAR | Status: DC | PRN
  Administered 2013-02-17: 12.5 ug via INTRAVENOUS

## 2013-02-17 MED ORDER — FENTANYL CITRATE 0.05 MG/ML IJ SOLN: 25.0000 ug | INTRAMUSCULAR | Status: DC | PRN

## 2013-02-17 NOTE — ED Notes (Signed)
Admitting MD at bedside.

## 2013-02-17 NOTE — ED Notes (Signed)
Pt returned from X ray. MD Clarene Duke at bedside.

## 2013-02-17 NOTE — Progress Notes (Signed)
Orthopedic Tech Progress Note Patient Details:  Lauren Avery [birth year] [id number]  Ortho Devices Type of Ortho Device: Arm sling;Post (long arm) splint;Ace wrap Ortho Device/Splint Location: (R) UE Ortho Device/Splint Interventions: Application;Ordered   Jennye Moccasin 02/17/2013, 6:23 PM

## 2013-02-17 NOTE — ED Provider Notes (Signed)
History     CSN: [id number]  Arrival date & time 02/17/13  1401   First MD Initiated Contact with Patient 02/17/13 1506      Chief Complaint  Patient presents with  . Arm Pain    right elbow  . Shoulder Pain    left     HPI Pt was seen at 1515.   Per pt, c/o sudden onset and resolution of one episode of slip/trip and fall that occurred today at home PTA.  Pt c/o left shoulder, right elbow, bilat hips bilat.  Pt states she "hit my head on the shelf" as she fell.  States she could not get up because her hips and arms "hurt" and she could not support her weight on her arms to pull herself up to standing.  Denies neck or back pain, no LOC/syncope, no AMS since fall, no abd pain, no N/V/D, no CP/SOB, no focal motor weakness, no tingling/numbness in extremities.    Ortho:  Dr. Cleophas Dunker Past Medical History  Diagnosis Date  . Osteoporosis   . Pulmonary fibrosis   . GERD (gastroesophageal reflux disease)   . Fracture of left clavicle     12/12 wore a sling  . Hypertension   . Diabetes mellitus   . Fracture 10/2012    left wrist    Past Surgical History  Procedure Laterality Date  . Hip fracture surgery      1984 pin and plate placed right  . Hip surgery      pin and plate removed 05/8294  . Femur fracture surgery       6/08 partial hip replacement right  . Ganglion cyst removal      left wrist 1960    Family History  Problem Relation Age of Onset  . Pancreatic cancer Brother   . Stroke Mother   . Diabetes Father     History  Substance Use Topics  . Smoking status: Never Smoker   . Smokeless tobacco: Not on file  . Alcohol Use: No    Review of Systems ROS: Statement: All systems negative except as marked or noted in the HPI; Constitutional: Negative for fever and chills. ; ; Eyes: Negative for eye pain, redness and discharge. ; ; ENMT: Negative for ear pain, hoarseness, nasal congestion, sinus pressure and sore throat. ; ; Cardiovascular: Negative for chest pain,  palpitations, diaphoresis, dyspnea and peripheral edema. ; ; Respiratory: Negative for cough, wheezing and stridor. ; ; Gastrointestinal: Negative for nausea, vomiting, diarrhea, abdominal pain, blood in stool, hematemesis, jaundice and rectal bleeding. . ; ; Genitourinary: Negative for dysuria, flank pain and hematuria. ; ; Musculoskeletal: +head injury, left shoulder pain, right elbow pain, bilat hips pain. Negative for back pain and neck pain. Negative for swelling and deformity. ; Skin: Negative for pruritus, rash, abrasions, blisters, bruising and skin lesion.; ; Neuro: Negative for headache, lightheadedness and neck stiffness. Negative for weakness, altered level of consciousness , altered mental status, extremity weakness, paresthesias, involuntary movement, seizure and syncope.     Allergies  Hydrochlorothiazide  Home Medications   Current Outpatient Rx  Name  Route  Sig  Dispense  Refill  . amLODipine (NORVASC) 5 MG tablet   Oral   Take 5 mg by mouth daily.          . Calcium Citrate (CITRACAL PO)   Oral   Take by mouth daily.         . calcium citrate-vitamin D (CITRACAL+D) 315-200 MG-UNIT per tablet  Oral   Take 1 tablet by mouth 2 (two) times daily.         . cetirizine (ZYRTEC) 10 MG tablet   Oral   Take 10 mg by mouth daily.         Marland Kitchen glimepiride (AMARYL) 2 MG tablet   Oral   Take 2 mg by mouth daily before breakfast.         . losartan (COZAAR) 50 MG tablet   Oral   Take 50 mg by mouth daily at 12 noon. With lunch         . Multiple Vitamins-Minerals (MULTIVITAMIN WITH MINERALS) tablet   Oral   Take 1 tablet by mouth daily.         Marland Kitchen omeprazole (PRILOSEC) 20 MG capsule      Take 1 capsule every morning before breakfast   30 capsule   5   . Zoledronic Acid (RECLAST IV)   Intravenous   Inject into the vein. yearly           BP 156/58  Pulse 107  Temp(Src) 97.5 F (36.4 C) (Oral)  Resp 18  SpO2 94%  Physical Exam 1520: Physical  examination: Vital signs and O2 SAT: Reviewed; Constitutional: Thin, In no acute distress; Head and Face: Normocephalic, Atraumatic; Eyes: EOMI, PERRL, No scleral icterus; ENMT: Mouth and pharynx normal, Left TM normal, Right TM normal, Mucous membranes dry; Neck: Supple, trachea midline; Spine: No midline CS, TS, LS tenderness.; Cardiovascular: Regular rate and rhythm, No gallop; Respiratory: Breath sounds clear & equal bilaterally, No wheezes, Normal respiratory effort/excursion; Chest: Nontender, No deformity, Movement normal, No crepitus, No abrasions or ecchymosis.; Abdomen: Soft, Nontender, Nondistended, Normal bowel sounds, No abrasions or ecchymosis.; Genitourinary: No CVA tenderness;; Extremities: No deformity, +right distal humeral and epicondyle areas tender to palp without edema, erythema, open wounds or deformity; NMS intact right hand with strong radial pulse. NT right shoulder/wrist/hand.  NT bilat clavicles. +generalized TTP left shoulder without specific area of point tenderness, +ROM left shoulder without edema, erythema, ecchymosis or deformity. NMS intact left hand. NT left elbow/wrist/hand. Neurovascularly intact, Pulses normal, No edema, Pelvis stable, NT bilat hips/knees/ankles/feet without edema, erythema, ecchymosis, or deformity.; Neuro: AA&Ox3, Major CN grossly intact. No facial droop. Speech clear. Moves all extremities, with exception of right elbow, on stretcher and to command without apparent gross focal motor or sensory deficits.; Skin: Color normal, Warm, Dry.    ED Course  Procedures    MDM  MDM Reviewed: previous chart, nursing note and vitals Reviewed previous: labs and CT scan Interpretation: x-ray, CT scan and labs   Results for orders placed during the hospital encounter of 02/17/13  GLUCOSE, CAPILLARY      Result Value Range   Glucose-Capillary 176 (*) 70 - 99 mg/dL  URINALYSIS, ROUTINE W REFLEX MICROSCOPIC      Result Value Range   Color, Urine YELLOW   YELLOW   APPearance CLEAR  CLEAR   Specific Gravity, Urine 1.020  1.005 - 1.030   pH 8.0  5.0 - 8.0   Glucose, UA NEGATIVE  NEGATIVE mg/dL   Hgb urine dipstick NEGATIVE  NEGATIVE   Bilirubin Urine NEGATIVE  NEGATIVE   Ketones, ur 40 (*) NEGATIVE mg/dL   Protein, ur NEGATIVE  NEGATIVE mg/dL   Urobilinogen, UA 0.2  0.0 - 1.0 mg/dL   Nitrite NEGATIVE  NEGATIVE   Leukocytes, UA NEGATIVE  NEGATIVE  BASIC METABOLIC PANEL      Result Value Range   Sodium  139  135 - 145 mEq/L   Potassium 4.2  3.5 - 5.1 mEq/L   Chloride 103  96 - 112 mEq/L   CO2 22  19 - 32 mEq/L   Glucose, Bld 176 (*) 70 - 99 mg/dL   BUN 12  6 - 23 mg/dL   Creatinine, Ser 4.54  0.50 - 1.10 mg/dL   Calcium 9.8  8.4 - 09.8 mg/dL   GFR calc non Af Amer 75 (*) >90 mL/min   GFR calc Af Amer 87 (*) >90 mL/min  CBC WITH DIFFERENTIAL      Result Value Range   WBC 11.9 (*) 4.0 - 10.5 K/uL   RBC 4.24  3.87 - 5.11 MIL/uL   Hemoglobin 12.7  12.0 - 15.0 g/dL   HCT 11.9 (*) 14.7 - 82.9 %   MCV 83.7  78.0 - 100.0 fL   MCH 30.0  26.0 - 34.0 pg   MCHC 35.8  30.0 - 36.0 g/dL   RDW 56.2  13.0 - 86.5 %   Platelets 100 (*) 150 - 400 K/uL   Neutrophils Relative 86 (*) 43 - 77 %   Neutro Abs 10.2 (*) 1.7 - 7.7 K/uL   Lymphocytes Relative 9 (*) 12 - 46 %   Lymphs Abs 1.1  0.7 - 4.0 K/uL   Monocytes Relative 5  3 - 12 %   Monocytes Absolute 0.6  0.1 - 1.0 K/uL   Eosinophils Relative 0  0 - 5 %   Eosinophils Absolute 0.0  0.0 - 0.7 K/uL   Basophils Relative 0  0 - 1 %   Basophils Absolute 0.0  0.0 - 0.1 K/uL   Dg Lumbar Spine Complete 02/17/2013  *RADIOLOGY REPORT*  Clinical Data: Fall with lower back pain  LUMBAR SPINE - COMPLETE 4+ VIEW  Comparison: Lumbar spine radiographs 11/10/2012  Findings: The bones are diffusely osteopenic.  There are five lumbar-type vertebral bodies. Minimal retrolisthesis of L2 on L3 is stable.  There is stable height loss of the inferior endplates of the T12 and L1 vertebral bodies.  There is mild superior  endplate height loss of the L2 vertebral body, stable.  No acute lumbar spine compression deformity is identified.  Postoperative changes right hip arthroplasty.  IMPRESSION: 1.  Mild chronic compression deformities of T12, L1, and L2.  These appear unchanged compared to lumbar spine radiographs 11/10/2012. 2.  No definite acute bony abnormality is identified.   Original Report Authenticated By: Britta Mccreedy, M.D.    Dg Pelvis 1-2 Views 02/17/2013  *RADIOLOGY REPORT*  Clinical Data: Larey Seat.  Pelvic pain.  PELVIS - 1-2 VIEW  Comparison: CT scan 11/18/2012  Findings: There is a bipolar hip prosthesis on the right. Moderate lucency along the bone cement interface medially appears relatively stable.  No acute fracture is seen.  The left hip is normally located.  No acute left hip fracture.  The pubic symphysis and SI joints are grossly normal.  No definite pelvic fractures.  IMPRESSION:  1.  Moderate lucency at the bone cement interface of the right hip prosthesis.  No acute fracture. 2.  No left hip or pelvic fractures.   Original Report Authenticated By: Rudie Meyer, M.D.    Dg Shoulder Right 02/17/2013  *RADIOLOGY REPORT*  Clinical Data: Larey Seat.  Right shoulder pain.  RIGHT SHOULDER - 2+ VIEW  Comparison: None  Findings: The joint spaces are maintained.  No acute fracture. Chronic-appearing interstitial disease noted the right lung.  IMPRESSION: No acute fracture.  Original Report Authenticated By: Rudie Meyer, M.D.    Dg Elbow 2 Views Right 02/17/2013  *RADIOLOGY REPORT*  Clinical Data: Larey Seat.  Right elbow pain.  RIGHT ELBOW - 2 VIEW  Comparison: None  Findings: There is a complex comminuted mainly longitudinal fracture splitting the distal humerus at the elbow joint.  The radius and ulna are intact.  IMPRESSION: Displaced intra-articular fracture of the distal humerus.   Original Report Authenticated By: Rudie Meyer, M.D.    Ct Head Wo Contrast 02/17/2013  *RADIOLOGY REPORT*  Clinical Data: Larey Seat.  Hit head.   CT HEAD WITHOUT CONTRAST  Technique:  Contiguous axial images were obtained from the base of the skull through the vertex without contrast.  Comparison: None  Findings: The ventricles are normal.  No extra-axial fluid collections are seen.  The brainstem and cerebellum are unremarkable.  No acute intracranial findings such as infarction or hemorrhage.  No mass lesions.  The bony calvarium is intact.  The visualized paranasal sinuses and mastoid air cells are clear.  IMPRESSION: No acute intracranial findings or skull fracture.   Original Report Authenticated By: Rudie Meyer, M.D.    Ct Pelvis Wo Contrast 02/17/2013  *RADIOLOGY REPORT*  Clinical Data: Complains of left hip pain and recent fall.  CT PELVIS WITHOUT CONTRAST  Technique:  Multidetector CT imaging of the pelvis was performed following the standard protocol without intravenous contrast.  Comparison: CT 11/18/2012  Findings: There are subtle bilateral sacral ala fractures. Minimally displaced fractures of the left superior and inferior pubic rami.  The right hip replacement is located.  The left hip is located.  No evidence for an acetabular fracture.  No gross abnormality to the uterus or adnexal structures.  No evidence for free fluid. There is asymmetry of the pelvic musculature, left side greater than right, and difficult to exclude any soft tissue swelling on the left side.  IMPRESSION: Mildly displaced fractures of the left pubic rami.  Bilateral sacral fractures.   Original Report Authenticated By: Richarda Overlie, M.D.    Dg Shoulder Left 02/17/2013  *RADIOLOGY REPORT*  Clinical Data: Left shoulder pain.  LEFT SHOULDER - 2+ VIEW  Comparison: Chest x-ray 02/09/2012.  Findings: There is an abnormality involving the distal clavicle but I think this is unchanged when compared the prior chest x-ray in this likely a remote distal clavicle fracture.  The [facility] joint is intact and the glenohumeral joint is intact.  There are glenohumeral joint degenerative  changes with inferior spurring from the glenoid but no acute fracture. Remote healed rib fractures are noted on the left.  IMPRESSION: Evidence of remote trauma involving the distal clavicle and left base.  No acute fracture.   Original Report Authenticated By: Rudie Meyer, M.D.     1730:  T/C to Ortho Dr. Lajoyce Corners (on call for Dr. Cleophas Dunker), case discussed, including:  HPI, pertinent PM/SHx, VS/PE, dx testing, ED course and treatment:  requests to place RUE in posterior splint/sling, d/c pt to f/u in office tomorrow.  1745:  Dx and testing d/w pt and family.  Questions answered.  Verb understanding.  States she "just can't go home like this," refuses to stand or attempt to walk. States her bilat hips "hurt more" when she flexes her hips. Pt had CT pelvis in 10/2012 with occult sacral fx and loosening of her right hip prosthesis; is due for bone scan this month to f/u these findings.  Will check XR lumbar spine and CT pelvis.  Will re-medicate for pain.  1810:  T/C from Ortho Dr. Lajoyce Corners:  Family apparently has called him, demanding pt be admitted; he states there is no orthopedic reason to admit her, as she can walk with a walker with possible sacral or pelvic fx and loosening of prosthesis is an old finding, she will not go to the OR tonight for humeral fracture repair, she can be placed in splint/sling; he states he has made family aware of all this and that she will need to go to a nursing home, please call internal medicine to admit, he can consult tomorrow.    1815:  T/C to Dr. Felipa Eth (on call for Dr. Wylene Simmer), case discussed, including:  HPI, pertinent PM/SHx, VS/PE, dx testing, ED course and treatment, as well as d/w Ortho MD:  Agreeable to come to ED for eval.  Dx and testing, as well as D/W Ortho MD and IM MD, d/w pt and family.  Questions answered.  Verb understanding, agreeable to be eval by Dr. Felipa Eth in the ED.              Laray Anger, DO 02/20/13 [phone]

## 2013-02-17 NOTE — H&P (Signed)
PCP:   Gaspar Garbe, MD   Chief Complaint:  Fall with pelvic pain as well as arm pain  HPI: This is an 79 year old Caucasian female who now lives alone, status post the death of her husband last fall, long history of osteoporosis, type 2 diabetes, interstitial lung disease that is idiopathic without oxygen dependence, hypertension, osteoarthritis, and stage III chronic kidney disease. She had a fall without loss of consciousness against a chair/desk this morning, resulting in significant pain to the hip as well as arm area, unable to get up, transported to the emergency roomwhere she was initially found to have a humeral fracture, placed in a sling, Dr. Lajoyce Corners  consult it,but given continued pain in the pelvic area, underwent CT scanning which revealed sacral fracture as well as mildly displaced pubic rami fracture . Patient unable to stand without significant pain and unable to go home with just a sling on the arm, now being admitted for pain management and further evaluation by orthopedics as well as disposition.  Review of Systems:  Negative for fevers, chills, headaches, visual changes, swallowing difficulties, shortness of breath above her baseline with her idiopathic pulmonary fibrosis, negative for chest pain, palpitations, orthostasis, change in bowel habits, blood in stool or urine, negative for edema or focal neurologic deficits. Negative for recent episodes of hypoglycemia.  Past Medical History: Past Medical History  Diagnosis Date  . Osteoporosis   . Pulmonary fibrosis   . GERD (gastroesophageal reflux disease)   . Fracture of left clavicle     12/12 wore a sling  . Hypertension   . Diabetes mellitus   . Fracture 10/2012    left wrist   diabetes related proteinuria Chronic kidney disease stage III Oster arthritis Right hip fracture 6/09, T8 compression fracture 2010 Collarbone fracture 2012 followed by Dr. Cleophas Dunker Left wrist and sacrum fractures 12/13 History of  palpitations    Past Surgical History  Procedure Laterality Date  . Hip fracture surgery      1984 pin and plate placed right  . Hip surgery      pin and plate removed 12/6107  . Femur fracture surgery       6/08 partial hip replacement right  . Ganglion cyst removal      left wrist 1960   history of rectocele repair  Medications: Prior to Admission medications   Medication Sig Start Date End Date Taking? Authorizing Provider  amLODipine (NORVASC) 5 MG tablet Take 5 mg by mouth daily.    Yes Historical Provider, MD  Calcium Citrate (CITRACAL PO) Take by mouth daily.   Yes Historical Provider, MD  calcium citrate-vitamin D (CITRACAL+D) 315-200 MG-UNIT per tablet Take 1 tablet by mouth 2 (two) times daily.   Yes Historical Provider, MD  cetirizine (ZYRTEC) 10 MG tablet Take 10 mg by mouth daily.   Yes Historical Provider, MD  glimepiride (AMARYL) 2 MG tablet Take 2 mg by mouth daily before breakfast.   Yes Historical Provider, MD  losartan (COZAAR) 50 MG tablet Take 50 mg by mouth daily at 12 noon. With lunch   Yes Historical Provider, MD  Multiple Vitamins-Minerals (MULTIVITAMIN WITH MINERALS) tablet Take 1 tablet by mouth daily.   Yes Historical Provider, MD  omeprazole (PRILOSEC) 20 MG capsule Take 1 capsule every morning before breakfast 01/28/13 01/28/14 Yes Barbaraann Share, MD  Zoledronic Acid (RECLAST IV) Inject into the vein. yearly    Historical Provider, MD    Allergies:   Allergies  Allergen Reactions  .  Hydrochlorothiazide Other (See Comments)    Taken from faxed notes of dr Wylene Simmer (to short stay)  THROMBOCYTOPENIA    Social History:  reports that she has never smoked. She does not have any smokeless tobacco history on file. She reports that she does not drink alcohol or use illicit drugs. widowed 8/13, 3 children, retired [employer] pilot with [employer]  Family History: Family History  Problem Relation Age of Onset  . Pancreatic cancer Brother   . Stroke Mother   .  Diabetes Father    family history significant for diabetes colon cancer coronary artery disease, Alzheimer's dementia, prostate cancer  Physical Exam: Filed Vitals:   02/17/13 1400 02/17/13 1658 02/17/13 1800  BP: 146/68 152/47 156/58  Pulse: 98 98 107  Temp: 97.5 F (36.4 C)    TempSrc: Oral    Resp: 20 18   SpO2: 94% 92% 94%   General appearance-pleasant, no apparent distress lying flat in bed, answering all questions appropriately, status post administration of pain medications Head exam normocephalic atraumatic, next line no oropharyngeal lesions tongue midline Neck supple, no cervical lymphadenopathy, no thyromegaly Lungs clear to auscultation bilaterally when auscultated anteriorly Right upper extremity and sliding Cardiovascular reveals regular rate and rhythm Abdomen reveals a soft nontender nondistended abdomen bowel sounds are present Extremity exam reveals no edema pedal pulses are intact, patient can move all distal extremities without hindrance, no sensory deprivation to light touch No cyanosis Examination of the extremities does reveal the right upper gym to be in a sling, patient is able to move the left upper extremity no tenderness to palpation, range of motion intact Evaluation of the extremities reveals pain with manipulation of the hip, and especially with raising the thigh, but no discomfort with movement of the ankles or distal feet, calf soft, without cords or Homans sign.    Labs on Admission:   Recent Labs  02/17/13 1855  NA 139  K 4.2  CL 103  CO2 22  GLUCOSE 176*  BUN 12  CREATININE 0.77  CALCIUM 9.8   No results found for this basename: AST, ALT, ALKPHOS, BILITOT, PROT, ALBUMIN,  in the last 72 hours No results found for this basename: LIPASE, AMYLASE,  in the last 72 hours  Recent Labs  02/17/13 1855  WBC 11.9*  NEUTROABS 10.2*  HGB 12.7  HCT 35.5*  MCV 83.7  PLT 100*   No results found for this basename: CKTOTAL, CKMB, CKMBINDEX,  TROPONINI,  in the last 72 hours No results found for this basename: TSH, T4TOTAL, FREET3, T3FREE, THYROIDAB,  in the last 72 hours No results found for this basename: VITAMINB12, FOLATE, FERRITIN, TIBC, IRON, RETICCTPCT,  in the last 72 hours  Radiological Exams on Admission: Dg Lumbar Spine Complete  02/17/2013  *RADIOLOGY REPORT*  Clinical Data: Fall with lower back pain  LUMBAR SPINE - COMPLETE 4+ VIEW  Comparison: Lumbar spine radiographs 11/10/2012  Findings: The bones are diffusely osteopenic.  There are five lumbar-type vertebral bodies. Minimal retrolisthesis of L2 on L3 is stable.  There is stable height loss of the inferior endplates of the T12 and L1 vertebral bodies.  There is mild superior endplate height loss of the L2 vertebral body, stable.  No acute lumbar spine compression deformity is identified.  Postoperative changes right hip arthroplasty.  IMPRESSION: 1.  Mild chronic compression deformities of T12, L1, and L2.  These appear unchanged compared to lumbar spine radiographs 11/10/2012. 2.  No definite acute bony abnormality is identified.   Original  Report Authenticated By: Britta Mccreedy, M.D.    Dg Pelvis 1-2 Views  02/17/2013  *RADIOLOGY REPORT*  Clinical Data: Larey Seat.  Pelvic pain.  PELVIS - 1-2 VIEW  Comparison: CT scan 11/18/2012  Findings: There is a bipolar hip prosthesis on the right. Moderate lucency along the bone cement interface medially appears relatively stable.  No acute fracture is seen.  The left hip is normally located.  No acute left hip fracture.  The pubic symphysis and SI joints are grossly normal.  No definite pelvic fractures.  IMPRESSION:  1.  Moderate lucency at the bone cement interface of the right hip prosthesis.  No acute fracture. 2.  No left hip or pelvic fractures.   Original Report Authenticated By: Rudie Meyer, M.D.    Dg Shoulder Right  02/17/2013  *RADIOLOGY REPORT*  Clinical Data: Larey Seat.  Right shoulder pain.  RIGHT SHOULDER - 2+ VIEW  Comparison:  None  Findings: The joint spaces are maintained.  No acute fracture. Chronic-appearing interstitial disease noted the right lung.  IMPRESSION: No acute fracture.   Original Report Authenticated By: Rudie Meyer, M.D.    Dg Elbow 2 Views Right  02/17/2013  *RADIOLOGY REPORT*  Clinical Data: Larey Seat.  Right elbow pain.  RIGHT ELBOW - 2 VIEW  Comparison: None  Findings: There is a complex comminuted mainly longitudinal fracture splitting the distal humerus at the elbow joint.  The radius and ulna are intact.  IMPRESSION: Displaced intra-articular fracture of the distal humerus.   Original Report Authenticated By: Rudie Meyer, M.D.    Ct Head Wo Contrast  02/17/2013  *RADIOLOGY REPORT*  Clinical Data: Larey Seat.  Hit head.  CT HEAD WITHOUT CONTRAST  Technique:  Contiguous axial images were obtained from the base of the skull through the vertex without contrast.  Comparison: None  Findings: The ventricles are normal.  No extra-axial fluid collections are seen.  The brainstem and cerebellum are unremarkable.  No acute intracranial findings such as infarction or hemorrhage.  No mass lesions.  The bony calvarium is intact.  The visualized paranasal sinuses and mastoid air cells are clear.  IMPRESSION: No acute intracranial findings or skull fracture.   Original Report Authenticated By: Rudie Meyer, M.D.    Ct Pelvis Wo Contrast  02/17/2013  *RADIOLOGY REPORT*  Clinical Data: Complains of left hip pain and recent fall.  CT PELVIS WITHOUT CONTRAST  Technique:  Multidetector CT imaging of the pelvis was performed following the standard protocol without intravenous contrast.  Comparison: CT 11/18/2012  Findings: There are subtle bilateral sacral ala fractures. Minimally displaced fractures of the left superior and inferior pubic rami.  The right hip replacement is located.  The left hip is located.  No evidence for an acetabular fracture.  No gross abnormality to the uterus or adnexal structures.  No evidence for free fluid.  There is asymmetry of the pelvic musculature, left side greater than right, and difficult to exclude any soft tissue swelling on the left side.  IMPRESSION: Mildly displaced fractures of the left pubic rami.  Bilateral sacral fractures.   Original Report Authenticated By: Richarda Overlie, M.D.    Dg Shoulder Left  02/17/2013  *RADIOLOGY REPORT*  Clinical Data: Left shoulder pain.  LEFT SHOULDER - 2+ VIEW  Comparison: Chest x-ray 02/09/2012.  Findings: There is an abnormality involving the distal clavicle but I think this is unchanged when compared the prior chest x-ray in this likely a remote distal clavicle fracture.  The [facility] joint is intact and the glenohumeral joint  is intact.  There are glenohumeral joint degenerative changes with inferior spurring from the glenoid but no acute fracture. Remote healed rib fractures are noted on the left.  IMPRESSION: Evidence of remote trauma involving the distal clavicle and left base.  No acute fracture.   Original Report Authenticated By: Rudie Meyer, M.D.    No orders found for this or any previous visit.  Assessment/Plan Right upper extremity humeral fracture placed in a splint by orthopedic tech in emergency room-will await orthopedic consultation for further evaluation and management Left pubic rami fracture and bilateral sacral fracture compared with 12/13-discussed with family, this is new apparently compared with December, doubt that patient will qualify for any type of surgical manipulation or repair, bedrest until mobility improves, discussed at length with the patient and family member, discussed the possibility of needing placement given lack of support and the fact that the patient lives alone Osteoporosis continues Reclast infusions, most recent vitamin D level normal at 58.3. Type 2 diabetes with renal complications-last hemoglobin A1c 7.7% no changes made, on sulfonylurea, at home-will discontinue sulfa and urea and continue sliding scale insulin Chronic  kidney disease stage III-less GFR 47.4 with BUN 16, creatinine 1.1-appears relatively stable  Hypertension-on calcium channel blocker and ARB therapy-we'll continue home regimen watching for hypotension Oster arthritis GERD, on PPI at home-we'll continue Pulmonary fibrosis-followed by Dr. Shelle Iron, was in pulmonary rehabilitation until wrist fracture-no respiratory distress with normal oxygenation Disposition-will check PT OT and social work consult, along with orthopedic consult;Dr. Whitfield's office through Dr. Lajoyce Corners should have been alerted this evening -suspect patient will need some sort of temporary placement based on mobility issues and the fact that she lives alone. Nikala Walsworth R 02/17/2013, 9:01 PM

## 2013-02-17 NOTE — ED Notes (Signed)
Patient states she tripped over chair in home today, patient with c/o right elbow pain, left shoulder pain, and left hip pain, Patient in +PMS in all extremities, pain with movement in right arm

## 2013-02-18 ENCOUNTER — Inpatient Hospital Stay (HOSPITAL_COMMUNITY): Payer: Medicare Other | Admitting: Anesthesiology

## 2013-02-18 ENCOUNTER — Encounter (HOSPITAL_COMMUNITY): Payer: Self-pay | Admitting: Certified Registered"

## 2013-02-18 ENCOUNTER — Encounter (HOSPITAL_COMMUNITY): Payer: Self-pay | Admitting: Anesthesiology

## 2013-02-18 ENCOUNTER — Encounter (HOSPITAL_COMMUNITY)
Admission: EM | Disposition: A | Discharge status: Skilled Nursing Facility | Payer: Self-pay | Source: Home / Self Care | Attending: Internal Medicine

## 2013-02-18 HISTORY — PX: ORIF ELBOW FRACTURE: SHX5031

## 2013-02-18 LAB — COMPREHENSIVE METABOLIC PANEL
ALT: 14 U/L (ref 0–35)
AST: 22 U/L (ref 0–37)
Albumin: 3.4 g/dL — ABNORMAL LOW (ref 3.5–5.2)
Alkaline Phosphatase: 63 U/L (ref 39–117)
BUN: 13 mg/dL (ref 6–23)
CO2: 24 mEq/L (ref 19–32)
Calcium: 9.1 mg/dL (ref 8.4–10.5)
Chloride: 102 mEq/L (ref 96–112)
Creatinine, Ser: 0.8 mg/dL (ref 0.50–1.10)
GFR calc Af Amer: 76 mL/min — ABNORMAL LOW (ref >90)
GFR calc non Af Amer: 66 mL/min — ABNORMAL LOW (ref >90)
Glucose, Bld: 111 mg/dL — ABNORMAL HIGH (ref 70–99)
Potassium: 3.7 mEq/L (ref 3.5–5.1)
Sodium: 136 mEq/L (ref 135–145)
Total Bilirubin: 1.7 mg/dL — ABNORMAL HIGH (ref 0.3–1.2)
Total Protein: 6.6 g/dL (ref 6.0–8.3)

## 2013-02-18 LAB — GLUCOSE, CAPILLARY
Glucose-Capillary: 293 mg/dL — ABNORMAL HIGH (ref 70–99)
Glucose-Capillary: 110 mg/dL — ABNORMAL HIGH (ref 70–99)
Glucose-Capillary: 117 mg/dL — ABNORMAL HIGH (ref 70–99)
Glucose-Capillary: 162 mg/dL — ABNORMAL HIGH (ref 70–99)
Glucose-Capillary: 147 mg/dL — ABNORMAL HIGH (ref 70–99)
Glucose-Capillary: 144 mg/dL — ABNORMAL HIGH (ref 70–99)
Glucose-Capillary: 204 mg/dL — ABNORMAL HIGH (ref 70–99)

## 2013-02-18 LAB — CBC
HCT: 34.8 % — ABNORMAL LOW (ref 36.0–46.0)
Hemoglobin: 12.1 g/dL (ref 12.0–15.0)
MCH: 29.4 pg (ref 26.0–34.0)
MCHC: 34.8 g/dL (ref 30.0–36.0)
MCV: 84.5 fL (ref 78.0–100.0)
Platelets: 90 10*3/uL — ABNORMAL LOW (ref 150–400)
RBC: 4.12 MIL/uL (ref 3.87–5.11)
RDW: 13.8 % (ref 11.5–15.5)
WBC: 9.9 10*3/uL (ref 4.0–10.5)

## 2013-02-18 LAB — URINE CULTURE
Colony Count: NO GROWTH
Culture: NO GROWTH

## 2013-02-18 LAB — MRSA PCR SCREENING: MRSA by PCR: NEGATIVE

## 2013-02-18 SURGERY — OPEN REDUCTION INTERNAL FIXATION (ORIF) ELBOW/OLECRANON FRACTURE
Anesthesia: General | Site: Elbow | Laterality: Right | Wound class: Clean

## 2013-02-18 MED ORDER — ONDANSETRON HCL 4 MG/2ML IJ SOLN
INTRAMUSCULAR | Status: DC | PRN
  Administered 2013-02-18 (×2): 4 mg via INTRAVENOUS

## 2013-02-18 MED ORDER — CEFAZOLIN SODIUM-DEXTROSE 2-3 GM-% IV SOLR
2.0000 g | INTRAVENOUS | Status: AC
  Administered 2013-02-18: 2 g via INTRAVENOUS
  Filled 2013-02-18: qty 50

## 2013-02-18 MED ORDER — SODIUM CHLORIDE 0.9 % IV SOLN
INTRAVENOUS | Status: DC
  Administered 2013-02-18: 09:00:00 via INTRAVENOUS

## 2013-02-18 MED ORDER — MEPERIDINE HCL 25 MG/ML IJ SOLN: 6.2500 mg | INTRAMUSCULAR | Status: DC | PRN

## 2013-02-18 MED ORDER — FENTANYL CITRATE 0.05 MG/ML IJ SOLN
25.0000 ug | INTRAMUSCULAR | Status: DC | PRN
  Administered 2013-02-18 (×2): 25 ug via INTRAVENOUS
  Administered 2013-02-18: 50 ug via INTRAVENOUS

## 2013-02-18 MED ORDER — FENTANYL CITRATE 0.05 MG/ML IJ SOLN
INTRAMUSCULAR | Status: DC | PRN
  Administered 2013-02-18: 100 ug via INTRAVENOUS
  Administered 2013-02-18: 50 ug via INTRAVENOUS

## 2013-02-18 MED ORDER — SODIUM CHLORIDE 0.9 % IV SOLN
INTRAVENOUS | Status: DC
  Administered 2013-02-18: 20 mL/h via INTRAVENOUS

## 2013-02-18 MED ORDER — OXYCODONE HCL 5 MG/5ML PO SOLN: 5.0000 mg | Freq: Once | ORAL | Status: AC | PRN

## 2013-02-18 MED ORDER — LACTATED RINGERS IV SOLN: INTRAVENOUS | Status: DC

## 2013-02-18 MED ORDER — PROMETHAZINE HCL 25 MG/ML IJ SOLN
INTRAMUSCULAR | Status: AC
  Filled 2013-02-18: qty 1

## 2013-02-18 MED ORDER — WARFARIN VIDEO
Freq: Once | Status: AC
  Administered 2013-02-18: 20:00:00

## 2013-02-18 MED ORDER — METOCLOPRAMIDE HCL 5 MG/ML IJ SOLN: 5.0000 mg | Freq: Three times a day (TID) | INTRAMUSCULAR | Status: DC | PRN

## 2013-02-18 MED ORDER — DEXAMETHASONE SODIUM PHOSPHATE 10 MG/ML IJ SOLN
INTRAMUSCULAR | Status: DC | PRN
  Administered 2013-02-18: 4 mg via INTRAVENOUS

## 2013-02-18 MED ORDER — LIDOCAINE HCL (CARDIAC) 20 MG/ML IV SOLN
INTRAVENOUS | Status: DC | PRN
  Administered 2013-02-18: 20 mg via INTRAVENOUS

## 2013-02-18 MED ORDER — WARFARIN - PHARMACIST DOSING INPATIENT: Freq: Every day | Status: DC

## 2013-02-18 MED ORDER — PROPOFOL 10 MG/ML IV BOLUS
INTRAVENOUS | Status: DC | PRN
  Administered 2013-02-18: 70 mg via INTRAVENOUS

## 2013-02-18 MED ORDER — ONDANSETRON HCL 4 MG/2ML IJ SOLN: 4.0000 mg | Freq: Four times a day (QID) | INTRAMUSCULAR | Status: DC | PRN

## 2013-02-18 MED ORDER — MIDAZOLAM HCL 2 MG/2ML IJ SOLN: 0.5000 mg | Freq: Once | INTRAMUSCULAR | Status: AC | PRN

## 2013-02-18 MED ORDER — ONDANSETRON HCL 4 MG PO TABS: 4.0000 mg | ORAL_TABLET | Freq: Four times a day (QID) | ORAL | Status: DC | PRN

## 2013-02-18 MED ORDER — LACTATED RINGERS IV SOLN
INTRAVENOUS | Status: DC | PRN
  Administered 2013-02-18: 14:00:00 via INTRAVENOUS

## 2013-02-18 MED ORDER — COUMADIN BOOK
Freq: Once | Status: AC
  Administered 2013-02-18: 20:00:00
  Filled 2013-02-18: qty 1

## 2013-02-18 MED ORDER — ROCURONIUM BROMIDE 100 MG/10ML IV SOLN
INTRAVENOUS | Status: DC | PRN
  Administered 2013-02-18: 30 mg via INTRAVENOUS

## 2013-02-18 MED ORDER — PROMETHAZINE HCL 25 MG/ML IJ SOLN
6.2500 mg | INTRAMUSCULAR | Status: DC | PRN
  Administered 2013-02-18: 6.25 mg via INTRAVENOUS

## 2013-02-18 MED ORDER — CEFAZOLIN SODIUM 1-5 GM-% IV SOLN
1.0000 g | Freq: Four times a day (QID) | INTRAVENOUS | Status: AC
  Administered 2013-02-18 – 2013-02-19 (×3): 1 g via INTRAVENOUS
  Filled 2013-02-18 (×3): qty 50

## 2013-02-18 MED ORDER — WARFARIN SODIUM 2.5 MG PO TABS
2.5000 mg | ORAL_TABLET | Freq: Once | ORAL | Status: AC
  Administered 2013-02-18: 2.5 mg via ORAL
  Filled 2013-02-18: qty 1

## 2013-02-18 MED ORDER — OXYCODONE HCL 5 MG PO TABS: 5.0000 mg | ORAL_TABLET | Freq: Once | ORAL | Status: AC | PRN

## 2013-02-18 MED ORDER — METOCLOPRAMIDE HCL 10 MG PO TABS: 5.0000 mg | ORAL_TABLET | Freq: Three times a day (TID) | ORAL | Status: DC | PRN

## 2013-02-18 MED ORDER — HYDROCODONE-ACETAMINOPHEN 5-325 MG PO TABS
1.0000 | ORAL_TABLET | ORAL | Status: DC | PRN
  Administered 2013-02-19 – 2013-02-20 (×3): 1 via ORAL
  Filled 2013-02-18: qty 1
  Filled 2013-02-18: qty 2
  Filled 2013-02-18: qty 1
  Filled 2013-02-18: qty 2
  Filled 2013-02-18: qty 1

## 2013-02-18 SURGICAL SUPPLY — 62 items
BANDAGE ELASTIC 4 VELCRO ST LF (GAUZE/BANDAGES/DRESSINGS) IMPLANT
BANDAGE GAUZE ELAST BULKY 4 IN (GAUZE/BANDAGES/DRESSINGS) ×2 IMPLANT
BIT DRILL 2.5X2.75 QC CALB (BIT) ×2 IMPLANT
BNDG COHESIVE 6X5 TAN STRL LF (GAUZE/BANDAGES/DRESSINGS) ×2 IMPLANT
BNDG ESMARK 4X9 LF (GAUZE/BANDAGES/DRESSINGS) ×2 IMPLANT
CLOTH BEACON ORANGE TIMEOUT ST (SAFETY) ×2 IMPLANT
COVER SURGICAL LIGHT HANDLE (MISCELLANEOUS) ×2 IMPLANT
CUFF TOURNIQUET SINGLE 18IN (TOURNIQUET CUFF) IMPLANT
CUFF TOURNIQUET SINGLE 24IN (TOURNIQUET CUFF) IMPLANT
DRAPE INCISE IOBAN 66X45 STRL (DRAPES) IMPLANT
DRAPE OEC MINIVIEW 54X84 (DRAPES) ×2 IMPLANT
DRAPE U-SHAPE 47X51 STRL (DRAPES) ×2 IMPLANT
DRSG ADAPTIC 3X8 NADH LF (GAUZE/BANDAGES/DRESSINGS) ×2 IMPLANT
DRSG EMULSION OIL 3X3 NADH (GAUZE/BANDAGES/DRESSINGS) IMPLANT
DRSG PAD ABDOMINAL 8X10 ST (GAUZE/BANDAGES/DRESSINGS) IMPLANT
DURAPREP 26ML APPLICATOR (WOUND CARE) ×2 IMPLANT
ELECT REM PT RETURN 9FT ADLT (ELECTROSURGICAL) ×2
ELECTRODE REM PT RTRN 9FT ADLT (ELECTROSURGICAL) ×1 IMPLANT
GAUZE SPONGE 4X4 16PLY XRAY LF (GAUZE/BANDAGES/DRESSINGS) ×2 IMPLANT
GLOVE BIOGEL PI IND STRL 9 (GLOVE) ×1 IMPLANT
GLOVE BIOGEL PI INDICATOR 9 (GLOVE) ×1
GLOVE SURG ORTHO 9.0 STRL STRW (GLOVE) ×2 IMPLANT
GOWN SRG XL XLNG 56XLVL 4 (GOWN DISPOSABLE) ×2 IMPLANT
GOWN STRL NON-REIN XL XLG LVL4 (GOWN DISPOSABLE) ×2
K-WIRE ACE 1.6X6 (WIRE) ×2
KIT BASIN OR (CUSTOM PROCEDURE TRAY) ×2 IMPLANT
KIT ROOM TURNOVER OR (KITS) ×2 IMPLANT
KWIRE ACE 1.6X6 (WIRE) ×1 IMPLANT
MANIFOLD NEPTUNE II (INSTRUMENTS) ×2 IMPLANT
NS IRRIG 1000ML POUR BTL (IV SOLUTION) ×2 IMPLANT
PACK ORTHO EXTREMITY (CUSTOM PROCEDURE TRAY) ×2 IMPLANT
PAD ARMBOARD 7.5X6 YLW CONV (MISCELLANEOUS) ×4 IMPLANT
PAD CAST 4YDX4 CTTN HI CHSV (CAST SUPPLIES) IMPLANT
PADDING CAST COTTON 4X4 STRL (CAST SUPPLIES)
PLATE LOCK RT SM (Plate) ×1 IMPLANT
PLATE LOCK RT SM 88X10.9X2.5X9 (Plate) ×1 IMPLANT
SCREW CORT 3.5X26 (Screw) ×1 IMPLANT
SCREW CORT T15 24X3.5XST LCK (Screw) ×1 IMPLANT
SCREW CORT T15 26X3.5XST LCK (Screw) ×1 IMPLANT
SCREW CORTICAL 3.5X24MM (Screw) ×1 IMPLANT
SCREW LOCK CORT STAR 3.5X14 (Screw) ×2 IMPLANT
SCREW LOCK CORT STAR 3.5X20 (Screw) ×2 IMPLANT
SCREW LOCK CORT STAR 3.5X22 (Screw) ×4 IMPLANT
SCREW LOW PROFILE 22MMX3.5MM (Screw) ×2 IMPLANT
SCREW LP 3.5 (Screw) ×4 IMPLANT
SPONGE GAUZE 4X4 12PLY (GAUZE/BANDAGES/DRESSINGS) ×2 IMPLANT
SPONGE LAP 18X18 X RAY DECT (DISPOSABLE) ×4 IMPLANT
STAPLER VISISTAT 35W (STAPLE) ×2 IMPLANT
STRIP CLOSURE SKIN 1/2X4 (GAUZE/BANDAGES/DRESSINGS) ×2 IMPLANT
SUCTION FRAZIER TIP 10 FR DISP (SUCTIONS) ×2 IMPLANT
SUT PROLENE 3 0 PS 1 (SUTURE) ×2 IMPLANT
SUT STEEL 5 (SUTURE) IMPLANT
SUT VIC AB 2-0 CTB1 (SUTURE) IMPLANT
SUT VIC AB 3-0 X1 27 (SUTURE) ×2 IMPLANT
SUT VICRYL 4-0 PS2 18IN ABS (SUTURE) IMPLANT
SYR BULB 3OZ (MISCELLANEOUS) ×2 IMPLANT
TOWEL OR 17X24 6PK STRL BLUE (TOWEL DISPOSABLE) ×2 IMPLANT
TOWEL OR 17X26 10 PK STRL BLUE (TOWEL DISPOSABLE) ×2 IMPLANT
TUBE CONNECTING 12X1/4 (SUCTIONS) ×2 IMPLANT
WASHER 3.5MM (Orthopedic Implant) ×4 IMPLANT
WATER STERILE IRR 1000ML POUR (IV SOLUTION) ×2 IMPLANT
YANKAUER SUCT BULB TIP NO VENT (SUCTIONS) ×2 IMPLANT

## 2013-02-18 NOTE — Preoperative (Signed)
Beta Blockers   Reason not to administer Beta Blockers:Not Applicable 

## 2013-02-18 NOTE — Progress Notes (Signed)
Utilization review completed. Kimbella Heisler, RN, BSN. 

## 2013-02-18 NOTE — Clinical Social Work Placement (Addendum)
Clinical Social Work Department  CLINICAL SOCIAL WORK PLACEMENT NOTE  02/18/2013  Patient: Lauren Avery Account Number: [id number] Admit date: 02/17/2013  Clinical Social Worker: Sabino Niemann MSW Date/time: 02/18/2013 2:30 PM  Clinical Social Work is seeking post-discharge placement for this patient at the following level of care: SKILLED NURSING (*CSW will update this form in Epic as items are completed)  02/18/2013 Patient/family provided with [facility] Department of Clinical Social Work's list of facilities offering this level of care within the geographic area requested by the patient (or if unable, by the patient's family).  02/18/2013 Patient/family informed of their freedom to choose among providers that offer the needed level of care, that participate in Medicare, Medicaid or managed care program needed by the patient, have an available bed and are willing to accept the patient.  02/18/2013 Patient/family informed of [facility]' ownership interest in [facility], as well as of the fact that they are under no obligation to receive care at this facility.  PASARR submitted to EDS on 02/18/2013  PASARR number received from [employer] on 02/18/2013  FL2 transmitted to all facilities in geographic area requested by pt/family on 02/18/2013  FL2 transmitted to all facilities within larger geographic area on  Patient informed that his/her managed care company has contracts with or will negotiate with certain facilities, including the following:  Patient/family informed of bed offers received: 02/19/2013 Patient chooses bed at  [facility] Physician recommends and patient chooses bed at  Patient to be transferred to on 02/21/2013 Patient to be transferred to facility by  [facility] The following physician request were entered in Epic:  Additional Comments:  Sabino Niemann, MSW,  [phone]  Signing off

## 2013-02-18 NOTE — Clinical Social Work Psychosocial (Signed)
Clinical Social Work Department  BRIEF PSYCHOSOCIAL ASSESSMENT  Patient: Lauren Avery Account Number: [id number] Admit date: 02/17/13 Clinical Social Worker Sabino Niemann, MSW Date/Time: 02/18/2013 3:00 PM  Referred by: Physician Date Referred: 02/18/2013  Referred for   SNF Placement   Other Referral:  Interview type: Patient and patient's daughter present Other interview type:  PSYCHOSOCIAL DATA  Living Status: Alone  Admitted from facility:  Level of care:  Primary support name:  Beasley,Teresa Primary support relationship to patient:  daughter Degree of support available:  Strong and vested   CURRENT CONCERNS  Current Concerns   Post-Acute Placement   Other Concerns:  SOCIAL WORK ASSESSMENT / PLAN  CSW met with pt re: PT recommendation for SNF.   Pt lives alone  CSW explained placement process and answered questions.   Pt reports [facility] as her preference   CSW completed FL2 and initiated SNF search.     Assessment/plan status: Information/Referral to [facility]  Other assessment/ plan:  Information/referral to community resources:  SNF     PATIENT'S/FAMILY'S RESPONSE TO PLAN OF CARE:  Pt reports she is agreeable to ST SNF in order to increase strength and independence with mobility prior to returning home Pt verbalized understanding of placement process and appreciation for CSW assist.   Sabino Niemann, MSW  [phone]

## 2013-02-18 NOTE — Transfer of Care (Signed)
Immediate Anesthesia Transfer of Care Note  Patient: Lauren Avery  Procedure(s) Performed: Procedure(s): OPEN REDUCTION INTERNAL FIXATION (ORIF) ELBOW/OLECRANON FRACTURE (Right)  Patient Location: PACU  Anesthesia Type:General  Level of Consciousness: awake, alert  and oriented  Airway & Oxygen Therapy: Patient Spontanous Breathing and Patient connected to nasal cannula oxygen  Post-op Assessment: Report given to PACU RN, Post -op Vital signs reviewed and stable and Patient moving all extremities  Post vital signs: Reviewed and stable  Complications: No apparent anesthesia complications

## 2013-02-18 NOTE — Op Note (Signed)
OPERATIVE REPORT  DATE OF SURGERY: 02/18/2013  PATIENT:  Lauren Avery,  79 y.o. female  PRE-OPERATIVE DIAGNOSIS:  Elbow fracture  POST-OPERATIVE DIAGNOSIS:  Elbow fracture  PROCEDURE:  Procedure(s): OPEN REDUCTION INTERNAL FIXATION (ORIF) ELBOW/OLECRANON FRACTURE  SURGEON:  Surgeon(s): Nadara Mustard, MD  ANESTHESIA:   general  EBL:  min ML  SPECIMEN:  No Specimen  TOURNIQUET:  * No tourniquets in log *  PROCEDURE DETAILS: Patient is an 79 year old woman who fell sustaining a supracondylar intercondylar right humerus fracture at the elbow. Patient had an unstable elbow fracture and presents at this time for open reduction internal fixation. Risks and benefits were discussed including infection neurovascular injury arthritis failure of fixation need for additional surgery. Patient and her family state they understand and wish to proceed at this time. Description of procedure patient brought to the operating room and underwent a general anesthetic. After adequate levels of anesthesia were obtained patient's right upper extremity was prepped using DuraPrep draped into a sterile field. A longitudinal medial incision was made over the medial epicondyle. Care was taken to protect the ulnar nerve. This was carried down to the fracture site the fracture was freshened and the fracture was held stabilized with a K wire. Using a contoured medial epicondyle plate the plate was secured with locking screws distally and compression screws proximally. C-arm fluoroscopy verified congruence of the joint. Patient had full extension flexion supination and pronation. The wound was irrigated normal saline subcutaneous is closed using 2-0 Vicryl skin was closed using staples. Wound is covered with Adaptic orthopedic sponges AB dressing Kerlix and Coban. Patient was extubated placed in a sling taken to the PACU in stable condition.  PLAN OF CARE: Admit to inpatient   PATIENT DISPOSITION:  PACU -  hemodynamically stable.   Nadara Mustard, MD 02/18/2013 4:15 PM

## 2013-02-18 NOTE — Progress Notes (Signed)
Subjective: Seen by Ortho this AM.  Plan for surgery on elbow.  Will need SNF for pelvic fracture.  Feels ok this AM  Objective: Vital signs in last 24 hours: Temp:  [97.5 F (36.4 C)-97.8 F (36.6 C)] 97.8 F (36.6 C) (03/21 0631) Pulse Rate:  [89-112] 89 (03/21 0631) Resp:  [17-20] 17 (03/21 0631) BP: (132-163)/(47-68) 132/53 mmHg (03/21 0631) SpO2:  [92 %-95 %] 95 % (03/21 0631) Weight change:  Last BM Date: 02/17/13  Intake/Output from previous day:   Intake/Output this shift:   General appearance-pleasant, no apparent distress lying flat in bed, Neck supple, no cervical lymphadenopathy, no thyromegaly  Lungs clear to auscultation bilaterally when auscultated anteriorly  Right upper extremity and sliding  Cardiovascular reveals regular rate and rhythm  Abdomen reveals a soft nontender nondistended abdomen bowel sounds are present Extremities Right upper arm in a sling, pain with manipulation of the hip, and especially with raising the thigh,   Lab Results:  Recent Labs  02/17/13 1855 02/18/13 0505  WBC 11.9* 9.9  HGB 12.7 12.1  HCT 35.5* 34.8*  PLT 100* 90*   BMET  Recent Labs  02/17/13 1855 02/18/13 0505  NA 139 136  K 4.2 3.7  CL 103 102  CO2 22 24  GLUCOSE 176* 111*  BUN 12 13  CREATININE 0.77 0.80  CALCIUM 9.8 9.1    Studies/Results: Dg Lumbar Spine Complete  02/17/2013  *RADIOLOGY REPORT*  Clinical Data: Fall with lower back pain  LUMBAR SPINE - COMPLETE 4+ VIEW  Comparison: Lumbar spine radiographs 11/10/2012  Findings: The bones are diffusely osteopenic.  There are five lumbar-type vertebral bodies. Minimal retrolisthesis of L2 on L3 is stable.  There is stable height loss of the inferior endplates of the T12 and L1 vertebral bodies.  There is mild superior endplate height loss of the L2 vertebral body, stable.  No acute lumbar spine compression deformity is identified.  Postoperative changes right hip arthroplasty.  IMPRESSION: 1.  Mild chronic  compression deformities of T12, L1, and L2.  These appear unchanged compared to lumbar spine radiographs 11/10/2012. 2.  No definite acute bony abnormality is identified.   Original Report Authenticated By: Britta Mccreedy, M.D.    Dg Pelvis 1-2 Views  02/17/2013  *RADIOLOGY REPORT*  Clinical Data: Larey Seat.  Pelvic pain.  PELVIS - 1-2 VIEW  Comparison: CT scan 11/18/2012  Findings: There is a bipolar hip prosthesis on the right. Moderate lucency along the bone cement interface medially appears relatively stable.  No acute fracture is seen.  The left hip is normally located.  No acute left hip fracture.  The pubic symphysis and SI joints are grossly normal.  No definite pelvic fractures.  IMPRESSION:  1.  Moderate lucency at the bone cement interface of the right hip prosthesis.  No acute fracture. 2.  No left hip or pelvic fractures.   Original Report Authenticated By: Rudie Meyer, M.D.    Dg Shoulder Right  02/17/2013  *RADIOLOGY REPORT*  Clinical Data: Larey Seat.  Right shoulder pain.  RIGHT SHOULDER - 2+ VIEW  Comparison: None  Findings: The joint spaces are maintained.  No acute fracture. Chronic-appearing interstitial disease noted the right lung.  IMPRESSION: No acute fracture.   Original Report Authenticated By: Rudie Meyer, M.D.    Dg Elbow 2 Views Right  02/17/2013  *RADIOLOGY REPORT*  Clinical Data: Larey Seat.  Right elbow pain.  RIGHT ELBOW - 2 VIEW  Comparison: None  Findings: There is a complex comminuted mainly longitudinal  fracture splitting the distal humerus at the elbow joint.  The radius and ulna are intact.  IMPRESSION: Displaced intra-articular fracture of the distal humerus.   Original Report Authenticated By: Rudie Meyer, M.D.    Ct Head Wo Contrast  02/17/2013  *RADIOLOGY REPORT*  Clinical Data: Larey Seat.  Hit head.  CT HEAD WITHOUT CONTRAST  Technique:  Contiguous axial images were obtained from the base of the skull through the vertex without contrast.  Comparison: None  Findings: The ventricles  are normal.  No extra-axial fluid collections are seen.  The brainstem and cerebellum are unremarkable.  No acute intracranial findings such as infarction or hemorrhage.  No mass lesions.  The bony calvarium is intact.  The visualized paranasal sinuses and mastoid air cells are clear.  IMPRESSION: No acute intracranial findings or skull fracture.   Original Report Authenticated By: Rudie Meyer, M.D.    Ct Pelvis Wo Contrast  02/17/2013  *RADIOLOGY REPORT*  Clinical Data: Complains of left hip pain and recent fall.  CT PELVIS WITHOUT CONTRAST  Technique:  Multidetector CT imaging of the pelvis was performed following the standard protocol without intravenous contrast.  Comparison: CT 11/18/2012  Findings: There are subtle bilateral sacral ala fractures. Minimally displaced fractures of the left superior and inferior pubic rami.  The right hip replacement is located.  The left hip is located.  No evidence for an acetabular fracture.  No gross abnormality to the uterus or adnexal structures.  No evidence for free fluid. There is asymmetry of the pelvic musculature, left side greater than right, and difficult to exclude any soft tissue swelling on the left side.  IMPRESSION: Mildly displaced fractures of the left pubic rami.  Bilateral sacral fractures.   Original Report Authenticated By: Richarda Overlie, M.D.    Dg Shoulder Left  02/17/2013  *RADIOLOGY REPORT*  Clinical Data: Left shoulder pain.  LEFT SHOULDER - 2+ VIEW  Comparison: Chest x-ray 02/09/2012.  Findings: There is an abnormality involving the distal clavicle but I think this is unchanged when compared the prior chest x-ray in this likely a remote distal clavicle fracture.  The [facility] joint is intact and the glenohumeral joint is intact.  There are glenohumeral joint degenerative changes with inferior spurring from the glenoid but no acute fracture. Remote healed rib fractures are noted on the left.  IMPRESSION: Evidence of remote trauma involving the distal  clavicle and left base.  No acute fracture.   Original Report Authenticated By: Rudie Meyer, M.D.     Medications:  I have reviewed the patient's current medications. Scheduled: . amLODipine  5 mg Oral Daily  . calcium-vitamin D  1 tablet Oral BID  . enoxaparin (LOVENOX) injection  40 mg Subcutaneous Q24H  . insulin aspart  0-5 Units Subcutaneous QHS  . insulin aspart  0-9 Units Subcutaneous TID WC  . loratadine  10 mg Oral Daily  . losartan  50 mg Oral Q1200  . pantoprazole  40 mg Oral Daily  . senna  1 tablet Oral BID   Continuous: . sodium chloride     ZOX:[id number], acetaminophen, bisacodyl, morphine injection, oxyCODONE  Assessment/Plan: Right upper extremity humeral fracture placed in a splint by orthopedic tech in emergency room.  Surgery planned per [facility] for later today.  NPO. Left pubic rami fracture and bilateral sacral fracture compared with 12/13- this is new  bedrest until mobility improves, needing placement given lack of support and the fact that the patient lives alone   Has friends at [place], so  will make plans for transfer there early next week as disposition. Osteoporosis continues Reclast infusions, most recent vitamin D level normal at 58.3.  Type 2 diabetes with renal complications-last hemoglobin A1c 7.7% no changes made, on sulfonylurea, at home-will discontinue and continue sliding scale insulin, since NPO pre surgery today. Chronic kidney disease stage III-less GFR 47.4 with BUN 16, creatinine 1.1-appears relatively stable  Hypertension-on calcium channel blocker and ARB therapy Osteoarthritis, pain management as above GERD, on PPI at home-we'll continue  Pulmonary fibrosis-followed by Dr. Shelle Iron, was in pulmonary rehabilitation until wrist fracture-no respiratory distress with normal oxygenation.  Will likely miss her appointment next month. Disposition-Plan for [facility] next week, PT/OT, weight bearing decisions per Ortho.   LOS: 1 day    Quilla Freeze W 02/18/2013, 7:44 AM

## 2013-02-18 NOTE — Progress Notes (Signed)
ANTICOAGULATION CONSULT NOTE - Initial Consult  Pharmacy Consult for Coumadin Indication: VTE prophylaxis  Allergies  Allergen Reactions  . Hydrochlorothiazide Other (See Comments)    Taken from faxed notes of dr Wylene Simmer (to short stay)  THROMBOCYTOPENIA    Patient Measurements: Height: 5' 1.81" (157 cm) Weight: 125 lb (56.7 kg) IBW/kg (Calculated) : 49.67  Vital Signs: Temp: 98.1 F (36.7 C) (03/21 1830) Temp src: Oral (03/21 1300) BP: 141/73 mmHg (03/21 1830) Pulse Rate: 77 (03/21 1830)  Labs:  Recent Labs  02/17/13 1855 02/18/13 0505  HGB 12.7 12.1  HCT 35.5* 34.8*  PLT 100* 90*  CREATININE 0.77 0.80    Estimated Creatinine Clearance: 41.1 ml/min (by C-G formula based on Cr of 0.8).   Medical History: Past Medical History  Diagnosis Date  . Osteoporosis   . Pulmonary fibrosis   . GERD (gastroesophageal reflux disease)   . Fracture of left clavicle     12/12 wore a sling  . Hypertension   . Diabetes mellitus   . Fracture 10/2012    left wrist    Medications:  Prescriptions prior to admission  Medication Sig Dispense Refill  . amLODipine (NORVASC) 5 MG tablet Take 5 mg by mouth daily.       . Calcium Citrate (CITRACAL PO) Take by mouth daily.      . calcium citrate-vitamin D (CITRACAL+D) 315-200 MG-UNIT per tablet Take 1 tablet by mouth 2 (two) times daily.      . cetirizine (ZYRTEC) 10 MG tablet Take 10 mg by mouth daily.      Marland Kitchen glimepiride (AMARYL) 2 MG tablet Take 2 mg by mouth daily before breakfast.      . losartan (COZAAR) 50 MG tablet Take 50 mg by mouth daily at 12 noon. With lunch      . Multiple Vitamins-Minerals (MULTIVITAMIN WITH MINERALS) tablet Take 1 tablet by mouth daily.      Marland Kitchen omeprazole (PRILOSEC) 20 MG capsule Take 1 capsule every morning before breakfast  30 capsule  5  . Zoledronic Acid (RECLAST IV) Inject into the vein. yearly        Assessment: 79 yo F who fell the morning of 02/17/13 resulting in humerus, sacral, and pubic  rami fractures.  Pt is s/p surgical repair of R elbow.  No further plans for orthopedic repair of other fractures.   Goal of Therapy:  INR 2-3 Monitor platelets by anticoagulation protocol: Yes   Plan:  Coumadin 2.5 mg PO x 1 tonight. Daily INR. Coumadin book/video.  Lauren Avery, Judie Bonus 02/18/2013,7:51 PM

## 2013-02-18 NOTE — Anesthesia Preprocedure Evaluation (Addendum)
Anesthesia Evaluation  Patient identified by MRN, date of birth, ID band Patient awake    Reviewed: Allergy & Precautions, H&P , NPO status , Patient's Chart, lab work & pertinent test results  Airway Mallampati: II TM Distance: >3 FB Neck ROM: Full    Dental  (+) Teeth Intact and Dental Advisory Given   Pulmonary pneumonia -, resolved,  Pulmonary fibrosis breath sounds clear to auscultation  Pulmonary exam normal       Cardiovascular hypertension, Pt. on medications Rhythm:Regular Rate:Normal     Neuro/Psych negative neurological ROS  negative psych ROS   GI/Hepatic Neg liver ROS, GERD-  Medicated and Controlled,  Endo/Other  diabetes (glu 147), Well Controlled, Type 2, Oral Hypoglycemic Agents  Renal/GU negative Renal ROS     Musculoskeletal   Abdominal   Peds  Hematology   Anesthesia Other Findings   Reproductive/Obstetrics                          Anesthesia Physical Anesthesia Plan  ASA: III  Anesthesia Plan: General   Post-op Pain Management:    Induction: Intravenous  Airway Management Planned: Oral ETT  Additional Equipment:   Intra-op Plan:   Post-operative Plan: Extubation in OR  Informed Consent: I have reviewed the patients History and Physical, chart, labs and discussed the procedure including the risks, benefits and alternatives for the proposed anesthesia with the patient or authorized representative who has indicated his/her understanding and acceptance.   Dental advisory given  Plan Discussed with: CRNA and Surgeon  Anesthesia Plan Comments: (Plan routine monitors, GETA )        Anesthesia Quick Evaluation

## 2013-02-18 NOTE — Consult Note (Signed)
Reason for Consult: Right elbow fracture Referring Physician: Dr. Joya Gaskins is an 79 y.o. female.  HPI: Patient is a 79 year old woman with severe osteoporosis she is status post a right hip hemiarthroplasty she has had recent falls complains of pelvic pain with weightbearing and has an acute fall with acute elbow injury.  Past Medical History  Diagnosis Date  . Osteoporosis   . Pulmonary fibrosis   . GERD (gastroesophageal reflux disease)   . Fracture of left clavicle     12/12 wore a sling  . Hypertension   . Diabetes mellitus   . Fracture 10/2012    left wrist    Past Surgical History  Procedure Laterality Date  . Hip fracture surgery      1984 pin and plate placed right  . Hip surgery      pin and plate removed 03/4009  . Femur fracture surgery       6/08 partial hip replacement right  . Ganglion cyst removal      left wrist 1960    Family History  Problem Relation Age of Onset  . Pancreatic cancer Brother   . Stroke Mother   . Diabetes Father     Social History:  reports that she has never smoked. She does not have any smokeless tobacco history on file. She reports that she does not drink alcohol or use illicit drugs.  Allergies:  Allergies  Allergen Reactions  . Hydrochlorothiazide Other (See Comments)    Taken from faxed notes of dr Wylene Simmer (to short stay)  THROMBOCYTOPENIA    Medications: I have reviewed the patient's current medications.  Results for orders placed during the hospital encounter of 02/17/13 (from the past 48 hour(s))  GLUCOSE, CAPILLARY     Status: Abnormal   Collection Time    02/17/13  3:09 PM      Result Value Range   Glucose-Capillary 176 (*) 70 - 99 mg/dL  BASIC METABOLIC PANEL     Status: Abnormal   Collection Time    02/17/13  6:55 PM      Result Value Range   Sodium 139  135 - 145 mEq/L   Potassium 4.2  3.5 - 5.1 mEq/L   Chloride 103  96 - 112 mEq/L   CO2 22  19 - 32 mEq/L   Glucose, Bld 176 (*) 70 - 99  mg/dL   BUN 12  6 - 23 mg/dL   Creatinine, Ser 2.72  0.50 - 1.10 mg/dL   Calcium 9.8  8.4 - 53.6 mg/dL   GFR calc non Af Amer 75 (*) >90 mL/min   GFR calc Af Amer 87 (*) >90 mL/min   Comment:            The eGFR has been calculated     using the CKD EPI equation.     This calculation has not been     validated in all clinical     situations.     eGFR's persistently     <90 mL/min signify     possible Chronic Kidney Disease.  CBC WITH DIFFERENTIAL     Status: Abnormal   Collection Time    02/17/13  6:55 PM      Result Value Range   WBC 11.9 (*) 4.0 - 10.5 K/uL   RBC 4.24  3.87 - 5.11 MIL/uL   Hemoglobin 12.7  12.0 - 15.0 g/dL   HCT 64.4 (*) 03.4 - 74.2 %   MCV 83.7  78.0 - 100.0 fL   MCH 30.0  26.0 - 34.0 pg   MCHC 35.8  30.0 - 36.0 g/dL   RDW 18.8  41.6 - 60.6 %   Platelets 100 (*) 150 - 400 K/uL   Comment: REPEATED TO VERIFY     SPECIMEN CHECKED FOR CLOTS     PLATELET COUNT CONFIRMED BY SMEAR   Neutrophils Relative 86 (*) 43 - 77 %   Neutro Abs 10.2 (*) 1.7 - 7.7 K/uL   Lymphocytes Relative 9 (*) 12 - 46 %   Lymphs Abs 1.1  0.7 - 4.0 K/uL   Monocytes Relative 5  3 - 12 %   Monocytes Absolute 0.6  0.1 - 1.0 K/uL   Eosinophils Relative 0  0 - 5 %   Eosinophils Absolute 0.0  0.0 - 0.7 K/uL   Basophils Relative 0  0 - 1 %   Basophils Absolute 0.0  0.0 - 0.1 K/uL  URINALYSIS, ROUTINE W REFLEX MICROSCOPIC     Status: Abnormal   Collection Time    02/17/13  7:33 PM      Result Value Range   Color, Urine YELLOW  YELLOW   APPearance CLEAR  CLEAR   Specific Gravity, Urine 1.020  1.005 - 1.030   pH 8.0  5.0 - 8.0   Glucose, UA NEGATIVE  NEGATIVE mg/dL   Hgb urine dipstick NEGATIVE  NEGATIVE   Bilirubin Urine NEGATIVE  NEGATIVE   Ketones, ur 40 (*) NEGATIVE mg/dL   Protein, ur NEGATIVE  NEGATIVE mg/dL   Urobilinogen, UA 0.2  0.0 - 1.0 mg/dL   Nitrite NEGATIVE  NEGATIVE   Leukocytes, UA NEGATIVE  NEGATIVE   Comment: MICROSCOPIC NOT DONE ON URINES WITH NEGATIVE PROTEIN,  BLOOD, LEUKOCYTES, NITRITE, OR GLUCOSE <1000 mg/dL.  GLUCOSE, CAPILLARY     Status: Abnormal   Collection Time    02/18/13  3:30 AM      Result Value Range   Glucose-Capillary 110 (*) 70 - 99 mg/dL    Dg Lumbar Spine Complete  02/17/2013  *RADIOLOGY REPORT*  Clinical Data: Fall with lower back pain  LUMBAR SPINE - COMPLETE 4+ VIEW  Comparison: Lumbar spine radiographs 11/10/2012  Findings: The bones are diffusely osteopenic.  There are five lumbar-type vertebral bodies. Minimal retrolisthesis of L2 on L3 is stable.  There is stable height loss of the inferior endplates of the T12 and L1 vertebral bodies.  There is mild superior endplate height loss of the L2 vertebral body, stable.  No acute lumbar spine compression deformity is identified.  Postoperative changes right hip arthroplasty.  IMPRESSION: 1.  Mild chronic compression deformities of T12, L1, and L2.  These appear unchanged compared to lumbar spine radiographs 11/10/2012. 2.  No definite acute bony abnormality is identified.   Original Report Authenticated By: Britta Mccreedy, M.D.    Dg Pelvis 1-2 Views  02/17/2013  *RADIOLOGY REPORT*  Clinical Data: Larey Seat.  Pelvic pain.  PELVIS - 1-2 VIEW  Comparison: CT scan 11/18/2012  Findings: There is a bipolar hip prosthesis on the right. Moderate lucency along the bone cement interface medially appears relatively stable.  No acute fracture is seen.  The left hip is normally located.  No acute left hip fracture.  The pubic symphysis and SI joints are grossly normal.  No definite pelvic fractures.  IMPRESSION:  1.  Moderate lucency at the bone cement interface of the right hip prosthesis.  No acute fracture. 2.  No left hip or pelvic fractures.  Original Report Authenticated By: Rudie Meyer, M.D.    Dg Shoulder Right  02/17/2013  *RADIOLOGY REPORT*  Clinical Data: Larey Seat.  Right shoulder pain.  RIGHT SHOULDER - 2+ VIEW  Comparison: None  Findings: The joint spaces are maintained.  No acute fracture.  Chronic-appearing interstitial disease noted the right lung.  IMPRESSION: No acute fracture.   Original Report Authenticated By: Rudie Meyer, M.D.    Dg Elbow 2 Views Right  02/17/2013  *RADIOLOGY REPORT*  Clinical Data: Larey Seat.  Right elbow pain.  RIGHT ELBOW - 2 VIEW  Comparison: None  Findings: There is a complex comminuted mainly longitudinal fracture splitting the distal humerus at the elbow joint.  The radius and ulna are intact.  IMPRESSION: Displaced intra-articular fracture of the distal humerus.   Original Report Authenticated By: Rudie Meyer, M.D.    Ct Head Wo Contrast  02/17/2013  *RADIOLOGY REPORT*  Clinical Data: Larey Seat.  Hit head.  CT HEAD WITHOUT CONTRAST  Technique:  Contiguous axial images were obtained from the base of the skull through the vertex without contrast.  Comparison: None  Findings: The ventricles are normal.  No extra-axial fluid collections are seen.  The brainstem and cerebellum are unremarkable.  No acute intracranial findings such as infarction or hemorrhage.  No mass lesions.  The bony calvarium is intact.  The visualized paranasal sinuses and mastoid air cells are clear.  IMPRESSION: No acute intracranial findings or skull fracture.   Original Report Authenticated By: Rudie Meyer, M.D.    Ct Pelvis Wo Contrast  02/17/2013  *RADIOLOGY REPORT*  Clinical Data: Complains of left hip pain and recent fall.  CT PELVIS WITHOUT CONTRAST  Technique:  Multidetector CT imaging of the pelvis was performed following the standard protocol without intravenous contrast.  Comparison: CT 11/18/2012  Findings: There are subtle bilateral sacral ala fractures. Minimally displaced fractures of the left superior and inferior pubic rami.  The right hip replacement is located.  The left hip is located.  No evidence for an acetabular fracture.  No gross abnormality to the uterus or adnexal structures.  No evidence for free fluid. There is asymmetry of the pelvic musculature, left side greater than  right, and difficult to exclude any soft tissue swelling on the left side.  IMPRESSION: Mildly displaced fractures of the left pubic rami.  Bilateral sacral fractures.   Original Report Authenticated By: Richarda Overlie, M.D.    Dg Shoulder Left  02/17/2013  *RADIOLOGY REPORT*  Clinical Data: Left shoulder pain.  LEFT SHOULDER - 2+ VIEW  Comparison: Chest x-ray 02/09/2012.  Findings: There is an abnormality involving the distal clavicle but I think this is unchanged when compared the prior chest x-ray in this likely a remote distal clavicle fracture.  The [facility] joint is intact and the glenohumeral joint is intact.  There are glenohumeral joint degenerative changes with inferior spurring from the glenoid but no acute fracture. Remote healed rib fractures are noted on the left.  IMPRESSION: Evidence of remote trauma involving the distal clavicle and left base.  No acute fracture.   Original Report Authenticated By: Rudie Meyer, M.D.     Review of Systems  All other systems reviewed and are negative.   Blood pressure 136/65, pulse 112, temperature 97.8 F (36.6 C), temperature source Oral, resp. rate 18, SpO2 95.00%. Physical Exam On examination patient's right upper extremity is neurovascularly intact. Radiographs shows a medial condyle fracture intra-articular of the right elbow. Radiographs also show bilateral sacral fractures left pubic rami fracture. Her  right hip hemiarthroplasty is stable. Assessment/Plan: Assessment: Right elbow medial condyle fracture intra-articular.  Plan: Will plan for open reduction internal fixation of the medial condyle fracture. Due to patient's multiple osteoporotic fractures she will require discharge to skilled nursing. Risks and benefits of surgery were discussed including infection neurovascular injury persistent pain need for additional surgery. Patient states she understands and wished to proceed at this time.  DUDA,MARCUS V 02/18/2013, 6:08 AM

## 2013-02-18 NOTE — Progress Notes (Signed)
PT Cancellation Note  Patient Details Name: HARMONY SANDELL MRN: [id number] DOB: 23-Jun-1927   Cancelled Treatment:    Reason Eval/Treat Not Completed: Patient not medically ready;Other (comment) (Pt to have surgery today. ) Will f/u with PT when pt is medically stable.    Donnamarie Poag [place], [place] [phone] 02/18/2013, 2:52 PM

## 2013-02-18 NOTE — Anesthesia Postprocedure Evaluation (Signed)
  Anesthesia Post-op Note  Patient: Lauren Avery  Procedure(s) Performed: Procedure(s): OPEN REDUCTION INTERNAL FIXATION (ORIF) ELBOW/OLECRANON FRACTURE (Right)  Patient Location: PACU  Anesthesia Type:General  Level of Consciousness: awake, alert  and oriented  Airway and Oxygen Therapy: Patient Spontanous Breathing and Patient connected to nasal cannula oxygen  Post-op Pain: mild  Post-op Assessment: Post-op Vital signs reviewed, Patient's Cardiovascular Status Stable, Respiratory Function Stable and Patent Airway  Post-op Vital Signs: stable  Complications: No apparent anesthesia complications

## 2013-02-18 NOTE — Progress Notes (Signed)
Pt complaint of Iv burning given 10cc NS with 6.25 pheneragan very slowly over 10 minutes. Iv flushed and checked had good blood return pt stated nausea relieved

## 2013-02-18 NOTE — Progress Notes (Signed)
Nonweightbearing through the right upper extremity. Patient is okay at this time for discharge to skilled nursing.

## 2013-02-19 ENCOUNTER — Inpatient Hospital Stay (HOSPITAL_COMMUNITY): Payer: Medicare Other

## 2013-02-19 DIAGNOSIS — M8448XA Pathological fracture, other site, initial encounter for fracture: Secondary | ICD-10-CM

## 2013-02-19 DIAGNOSIS — S32509A Unspecified fracture of unspecified pubis, initial encounter for closed fracture: Secondary | ICD-10-CM

## 2013-02-19 DIAGNOSIS — S42461A Displaced fracture of medial condyle of right humerus, initial encounter for closed fracture: Secondary | ICD-10-CM

## 2013-02-19 LAB — GLUCOSE, CAPILLARY
Glucose-Capillary: 190 mg/dL — ABNORMAL HIGH (ref 70–99)
Glucose-Capillary: 180 mg/dL — ABNORMAL HIGH (ref 70–99)
Glucose-Capillary: 217 mg/dL — ABNORMAL HIGH (ref 70–99)

## 2013-02-19 LAB — PROTIME-INR
INR: 1.23 (ref 0.00–1.49)
Prothrombin Time: 15.3 seconds — ABNORMAL HIGH (ref 11.6–15.2)

## 2013-02-19 MED ORDER — WARFARIN SODIUM 2.5 MG PO TABS
2.5000 mg | ORAL_TABLET | Freq: Once | ORAL | Status: AC
  Administered 2013-02-19: 2.5 mg via ORAL
  Filled 2013-02-19: qty 1

## 2013-02-19 NOTE — Progress Notes (Signed)
Lauren Avery, [place] [phone] 02/19/2013

## 2013-02-19 NOTE — Progress Notes (Signed)
Subjective: Doing well overall.. Left mid humeral area is bruised and sore with a "popping" with movement. Pain on right is controlled. Sore overall. No CP or SOB. No nausea   Objective: Vital signs in last 24 hours: Temp:  [97.7 F (36.5 C)-98.6 F (37 C)] 98.6 F (37 C) (03/22 0543) Pulse Rate:  [77-106] 83 (03/22 0543) Resp:  [18-27] 18 (03/22 0543) BP: (105-152)/(47-80) 107/49 mmHg (03/22 0543) SpO2:  [95 %-100 %] 96 % (03/22 0543) Weight:  [56.7 kg (125 lb)] 56.7 kg (125 lb) (03/21 1937)  Intake/Output from previous day: 03/21 0701 - 03/22 0700 In: 860 [P.O.:360; I.V.:500] Out: 100 [Urine:75; Blood:25] Intake/Output this shift:    General: alertm sitting up in no distress. Face symmetric, neck supple. Lungs clear. Ht regular with murmur. abd soft NT, extrems: right arm bandaged. Swelling left post mid humeral area. Distal pulses intact. Awake alert mentating well   Lab Results   Recent Labs  02/17/13 1855 02/18/13 0505  WBC 11.9* 9.9  RBC 4.24 4.12  HGB 12.7 12.1  HCT 35.5* 34.8*  MCV 83.7 84.5  MCH 30.0 29.4  RDW 13.6 13.8  PLT 100* 90*    Recent Labs  02/17/13 1855 02/18/13 0505  NA 139 136  K 4.2 3.7  CL 103 102  CO2 22 24  GLUCOSE 176* 111*  BUN 12 13  CREATININE 0.77 0.80  CALCIUM 9.8 9.1    Studies/Results: Dg Lumbar Spine Complete  02/17/2013  *RADIOLOGY REPORT*  Clinical Data: Fall with lower back pain  LUMBAR SPINE - COMPLETE 4+ VIEW  Comparison: Lumbar spine radiographs 11/10/2012  Findings: The bones are diffusely osteopenic.  There are five lumbar-type vertebral bodies. Minimal retrolisthesis of L2 on L3 is stable.  There is stable height loss of the inferior endplates of the T12 and L1 vertebral bodies.  There is mild superior endplate height loss of the L2 vertebral body, stable.  No acute lumbar spine compression deformity is identified.  Postoperative changes right hip arthroplasty.  IMPRESSION: 1.  Mild chronic compression deformities of  T12, L1, and L2.  These appear unchanged compared to lumbar spine radiographs 11/10/2012. 2.  No definite acute bony abnormality is identified.   Original Report Authenticated By: Britta Mccreedy, M.D.    Dg Pelvis 1-2 Views  02/17/2013  *RADIOLOGY REPORT*  Clinical Data: Larey Seat.  Pelvic pain.  PELVIS - 1-2 VIEW  Comparison: CT scan 11/18/2012  Findings: There is a bipolar hip prosthesis on the right. Moderate lucency along the bone cement interface medially appears relatively stable.  No acute fracture is seen.  The left hip is normally located.  No acute left hip fracture.  The pubic symphysis and SI joints are grossly normal.  No definite pelvic fractures.  IMPRESSION:  1.  Moderate lucency at the bone cement interface of the right hip prosthesis.  No acute fracture. 2.  No left hip or pelvic fractures.   Original Report Authenticated By: Rudie Meyer, M.D.    Dg Shoulder Right  02/17/2013  *RADIOLOGY REPORT*  Clinical Data: Larey Seat.  Right shoulder pain.  RIGHT SHOULDER - 2+ VIEW  Comparison: None  Findings: The joint spaces are maintained.  No acute fracture. Chronic-appearing interstitial disease noted the right lung.  IMPRESSION: No acute fracture.   Original Report Authenticated By: Rudie Meyer, M.D.    Dg Elbow 2 Views Right  02/17/2013  *RADIOLOGY REPORT*  Clinical Data: Larey Seat.  Right elbow pain.  RIGHT ELBOW - 2 VIEW  Comparison: None  Findings: There is a complex comminuted mainly longitudinal fracture splitting the distal humerus at the elbow joint.  The radius and ulna are intact.  IMPRESSION: Displaced intra-articular fracture of the distal humerus.   Original Report Authenticated By: Rudie Meyer, M.D.    Ct Head Wo Contrast  02/17/2013  *RADIOLOGY REPORT*  Clinical Data: Larey Seat.  Hit head.  CT HEAD WITHOUT CONTRAST  Technique:  Contiguous axial images were obtained from the base of the skull through the vertex without contrast.  Comparison: None  Findings: The ventricles are normal.  No  extra-axial fluid collections are seen.  The brainstem and cerebellum are unremarkable.  No acute intracranial findings such as infarction or hemorrhage.  No mass lesions.  The bony calvarium is intact.  The visualized paranasal sinuses and mastoid air cells are clear.  IMPRESSION: No acute intracranial findings or skull fracture.   Original Report Authenticated By: Rudie Meyer, M.D.    Ct Pelvis Wo Contrast  02/17/2013  *RADIOLOGY REPORT*  Clinical Data: Complains of left hip pain and recent fall.  CT PELVIS WITHOUT CONTRAST  Technique:  Multidetector CT imaging of the pelvis was performed following the standard protocol without intravenous contrast.  Comparison: CT 11/18/2012  Findings: There are subtle bilateral sacral ala fractures. Minimally displaced fractures of the left superior and inferior pubic rami.  The right hip replacement is located.  The left hip is located.  No evidence for an acetabular fracture.  No gross abnormality to the uterus or adnexal structures.  No evidence for free fluid. There is asymmetry of the pelvic musculature, left side greater than right, and difficult to exclude any soft tissue swelling on the left side.  IMPRESSION: Mildly displaced fractures of the left pubic rami.  Bilateral sacral fractures.   Original Report Authenticated By: Richarda Overlie, M.D.    Dg Shoulder Left  02/17/2013  *RADIOLOGY REPORT*  Clinical Data: Left shoulder pain.  LEFT SHOULDER - 2+ VIEW  Comparison: Chest x-ray 02/09/2012.  Findings: There is an abnormality involving the distal clavicle but I think this is unchanged when compared the prior chest x-ray in this likely a remote distal clavicle fracture.  The [facility] joint is intact and the glenohumeral joint is intact.  There are glenohumeral joint degenerative changes with inferior spurring from the glenoid but no acute fracture. Remote healed rib fractures are noted on the left.  IMPRESSION: Evidence of remote trauma involving the distal clavicle and left  base.  No acute fracture.   Original Report Authenticated By: Rudie Meyer, M.D.     Scheduled Meds: . amLODipine  5 mg Oral Daily  . calcium-vitamin D  1 tablet Oral BID  . enoxaparin (LOVENOX) injection  40 mg Subcutaneous Q24H  . insulin aspart  0-5 Units Subcutaneous QHS  . insulin aspart  0-9 Units Subcutaneous TID WC  . loratadine  10 mg Oral Daily  . losartan  50 mg Oral Q1200  . pantoprazole  40 mg Oral Daily  . senna  1 tablet Oral BID  . Warfarin - Pharmacist Dosing Inpatient   Does not apply q1800   Continuous Infusions: . sodium chloride 50 mL/hr at 02/18/13 0841  . sodium chloride 20 mL/hr (02/18/13 2202)  . lactated ringers     PRN Meds:acetaminophen, acetaminophen, bisacodyl, fentaNYL, HYDROcodone-acetaminophen, meperidine (DEMEROL) injection, metoCLOPramide (REGLAN) injection, metoCLOPramide, morphine injection, ondansetron (ZOFRAN) IV, ondansetron, oxyCODONE, promethazine  Assessment/Plan: Patient Active Problem List  Diagnosis  . DM type 2: on ss only, doing OK  . HYPERTENSION:  BP is good  . Idiopathic pulmonary fibrosis: O2 sats OK  . Chronic cough: stable  . Fracture of elbow, medial condyle, right, closed: now repaired  . Closed fracture of single pubic ramus of pelvis: pain is controlled  . Bilateral sacral insufficiency fracture: dong OK Osteoporosis: has gotten reclast      LOS: 2 days   Latondra Gebhart ALAN 02/19/2013, 10:15 AM

## 2013-02-19 NOTE — Progress Notes (Signed)
ANTICOAGULATION CONSULT NOTE - Follow Up Consult  Pharmacy Consult:  Coumadin Indication:  VTE prophylaxis  Allergies  Allergen Reactions  . Hydrochlorothiazide Other (See Comments)    Taken from faxed notes of dr Wylene Simmer (to short stay)  THROMBOCYTOPENIA    Patient Measurements: Height: 5' 1.81" (157 cm) Weight: 125 lb (56.7 kg) IBW/kg (Calculated) : 49.67  Vital Signs: Temp: 98.6 F (37 C) (03/22 0543) BP: 104/48 mmHg (03/22 1028) Pulse Rate: 83 (03/22 0543)  Labs:  Recent Labs  02/17/13 1855 02/18/13 0505 02/19/13 0547  HGB 12.7 12.1  --   HCT 35.5* 34.8*  --   PLT 100* 90*  --   LABPROT  --   --  15.3*  INR  --   --  1.23  CREATININE 0.77 0.80  --     Estimated Creatinine Clearance: 41.1 ml/min (by C-G formula based on Cr of 0.8).       Assessment: 7 YOF who fell the morning of 02/17/13 resulting in humerus, sacral, and pubic rami fractures.  Pt is s/p surgical repair of right elbow.  No further plans for orthopedic repair of other fractures.  She continues on Lovenox and Coumadin for VTE prophylaxis.  INR sub-therapeutic; no bleeding reported.  Noted patient with thrombocytopenia.  Her renal function is appropriate for current Lovenox dosing.     Goal of Therapy:  INR 2-3 Monitor platelets by anticoagulation protocol: Yes    Plan:  - Repeat Coumadin 2.5mg  PO today - Continue Lovenox until INR >/= 2 - Daily PT / INR - Consider resuming home med glimepiride for better glycemic control     Valina Maes D. Laney Potash, PharmD, BCPS Pager:  [phone] 02/19/2013, 11:19 AM

## 2013-02-19 NOTE — Progress Notes (Signed)
Patient ID: Lauren Avery, female   DOB: [birth year], 79 y.o.   MRN: [id number] Subjective: 1 Day Post-Op Procedure(s) (LRB): OPEN REDUCTION INTERNAL FIXATION (ORIF) ELBOW/OLECRANON FRACTURE (Right) Awake alert and Ox4 Patient reports pain as mild.    Objective:   VITALS:  Temp:  [97.7 F (36.5 C)-98.6 F (37 C)] 98.6 F (37 C) (03/22 0543) Pulse Rate:  [77-106] 83 (03/22 0543) Resp:  [18-27] 18 (03/22 0543) BP: (105-152)/(46-80) 107/49 mmHg (03/22 0543) SpO2:  [95 %-100 %] 96 % (03/22 0543) Weight:  [56.7 kg (125 lb)] 56.7 kg (125 lb) (03/21 1937)  Neurologically intact ABD soft Neurovascular intact Sensation intact distally Intact pulses distally Dorsiflexion/Plantar flexion intact Incision: dressing C/D/I   LABS  Recent Labs  02/17/13 1855 02/18/13 0505  HGB 12.7 12.1  WBC 11.9* 9.9  PLT 100* 90*    Recent Labs  02/17/13 1855 02/18/13 0505  NA 139 136  K 4.2 3.7  CL 103 102  CO2 22 24  BUN 12 13  CREATININE 0.77 0.80  GLUCOSE 176* 111*    Recent Labs  02/19/13 0547  INR 1.23     Assessment/Plan: 1 Day Post-Op Procedure(s) (LRB): OPEN REDUCTION INTERNAL FIXATION (ORIF) ELBOW/OLECRANON FRACTURE (Right)  Advance diet Up with therapy Discharge to SNF  Bud Kaeser E 02/19/2013, 8:53 AM

## 2013-02-19 NOTE — Progress Notes (Signed)
PT/OT Cancellation Note  Patient Details Name: Lauren Avery MRN: [id number] DOB: 1927/09/24   Cancelled Treatment:    Reason Eval/Treat Not Completed: Patient not medically ready (Awaiting new x-rays of L UE to r/o additional injuries.  )  Will f/u another time.     Sunny Schlein, [place] [phone] 02/19/2013, 11:47 AM

## 2013-02-20 LAB — CBC
HCT: 29.9 % — ABNORMAL LOW (ref 36.0–46.0)
Hemoglobin: 10.1 g/dL — ABNORMAL LOW (ref 12.0–15.0)
MCH: 29.4 pg (ref 26.0–34.0)
MCHC: 33.8 g/dL (ref 30.0–36.0)
MCV: 87.2 fL (ref 78.0–100.0)
Platelets: 79 10*3/uL — ABNORMAL LOW (ref 150–400)
RBC: 3.43 MIL/uL — ABNORMAL LOW (ref 3.87–5.11)
RDW: 13.6 % (ref 11.5–15.5)
WBC: 12.2 10*3/uL — ABNORMAL HIGH (ref 4.0–10.5)

## 2013-02-20 LAB — GLUCOSE, CAPILLARY
Glucose-Capillary: 180 mg/dL — ABNORMAL HIGH (ref 70–99)
Glucose-Capillary: 123 mg/dL — ABNORMAL HIGH (ref 70–99)
Glucose-Capillary: 198 mg/dL — ABNORMAL HIGH (ref 70–99)
Glucose-Capillary: 181 mg/dL — ABNORMAL HIGH (ref 70–99)
Glucose-Capillary: 205 mg/dL — ABNORMAL HIGH (ref 70–99)

## 2013-02-20 LAB — PROTIME-INR
INR: 1.2 (ref 0.00–1.49)
Prothrombin Time: 15 seconds (ref 11.6–15.2)

## 2013-02-20 LAB — BASIC METABOLIC PANEL
BUN: 21 mg/dL (ref 6–23)
CO2: 28 mEq/L (ref 19–32)
Calcium: 9 mg/dL (ref 8.4–10.5)
Chloride: 104 mEq/L (ref 96–112)
Creatinine, Ser: 0.85 mg/dL (ref 0.50–1.10)
GFR calc Af Amer: 71 mL/min — ABNORMAL LOW (ref >90)
GFR calc non Af Amer: 61 mL/min — ABNORMAL LOW (ref >90)
Glucose, Bld: 147 mg/dL — ABNORMAL HIGH (ref 70–99)
Potassium: 4.1 mEq/L (ref 3.5–5.1)
Sodium: 138 mEq/L (ref 135–145)

## 2013-02-20 MED ORDER — WARFARIN SODIUM 2.5 MG PO TABS
2.5000 mg | ORAL_TABLET | Freq: Once | ORAL | Status: AC
  Administered 2013-02-20: 2.5 mg via ORAL
  Filled 2013-02-20: qty 1

## 2013-02-20 MED ORDER — WHITE PETROLATUM GEL
Status: AC
  Filled 2013-02-20: qty 5

## 2013-02-20 NOTE — Progress Notes (Signed)
Subjective: Doing well. No fx on left. No BM. Eating OK. Slept fair. No new pains   Objective: Vital signs in last 24 hours: Temp:  [98 F (36.7 C)-98.5 F (36.9 C)] 98 F (36.7 C) (03/23 0634) Pulse Rate:  [77-91] 77 (03/23 0634) Resp:  [18] 18 (03/23 0634) BP: (104-142)/(44-48) 130/46 mmHg (03/23 0634) SpO2:  [93 %-94 %] 93 % (03/23 0634)  Intake/Output from previous day: 03/22 0701 - 03/23 0700 In: 600 [P.O.:600] Out: -  Intake/Output this shift:    General: alert sitting up in no distress. Face symmetric, neck supple lungs clear. Ht regular. No edema alert awake mentating well. Right arm in sling  Lab Results   Recent Labs  02/18/13 0505 02/20/13 0555  WBC 9.9 12.2*  RBC 4.12 3.43*  HGB 12.1 10.1*  HCT 34.8* 29.9*  MCV 84.5 87.2  MCH 29.4 29.4  RDW 13.8 13.6  PLT 90* 79*    Recent Labs  02/18/13 0505 02/20/13 0555  NA 136 138  K 3.7 4.1  CL 102 104  CO2 24 28  GLUCOSE 111* 147*  BUN 13 21  CREATININE 0.80 0.85  CALCIUM 9.1 9.0    Studies/Results: Dg Humerus Left  02/19/2013  *RADIOLOGY REPORT*  Clinical Data: Fall.  Pain  LEFT HUMERUS - 2+ VIEW  Comparison: Left shoulder 02/17/2013  Findings: Negative for fracture of the humerus.  Mild degenerative change of the proximal humerus due to rotator cuff disease.  Chronic left rib fracture.  IMPRESSION: Negative for acute fracture.   Original Report Authenticated By: Janeece Riggers, M.D.     Scheduled Meds: . amLODipine  5 mg Oral Daily  . calcium-vitamin D  1 tablet Oral BID  . enoxaparin (LOVENOX) injection  40 mg Subcutaneous Q24H  . insulin aspart  0-5 Units Subcutaneous QHS  . insulin aspart  0-9 Units Subcutaneous TID WC  . loratadine  10 mg Oral Daily  . losartan  50 mg Oral Q1200  . pantoprazole  40 mg Oral Daily  . senna  1 tablet Oral BID  . Warfarin - Pharmacist Dosing Inpatient   Does not apply q1800   Continuous Infusions: . sodium chloride 50 mL/hr at 02/18/13 0841  . sodium chloride 20  mL/hr (02/18/13 2202)  . lactated ringers     PRN Meds:acetaminophen, acetaminophen, bisacodyl, fentaNYL, HYDROcodone-acetaminophen, meperidine (DEMEROL) injection, metoCLOPramide (REGLAN) injection, metoCLOPramide, morphine injection, ondansetron (ZOFRAN) IV, ondansetron, oxyCODONE, promethazine  Assessment/Plan:  DM type 2: on ss only, doing OK   HYPERTENSION: BP is good , no elevations. Wide pulse pressure noted  Idiopathic pulmonary fibrosis: O2 sats OK   Chronic cough: stable   Fracture of elbow, medial condyle, right, closed: now repaired   Closed fracture of single pubic ramus of pelvis: pain is controlled  . Bilateral sacral insufficiency fracture: doing OK  Osteoporosis: has gotten reclast  GERD: on Rx Anemia: mild at 10.1    LOS: 3 days   Lauren Avery ALAN 02/20/2013, 9:53 AM

## 2013-02-20 NOTE — Progress Notes (Signed)
ANTICOAGULATION CONSULT NOTE - Follow Up Consult  Pharmacy Consult for coumadin Indication: VTE prophylaxis  Allergies  Allergen Reactions  . Hydrochlorothiazide Other (See Comments)    Taken from faxed notes of dr Wylene Simmer (to short stay)  THROMBOCYTOPENIA    Patient Measurements: Height: 5' 1.81" (157 cm) Weight: 125 lb (56.7 kg) IBW/kg (Calculated) : 49.67   Vital Signs: Temp: 98 F (36.7 C) (03/23 0634) BP: 130/46 mmHg (03/23 0634) Pulse Rate: 77 (03/23 0634)  Labs:  Recent Labs  02/17/13 1855 02/18/13 0505 02/19/13 0547 02/20/13 0555  HGB 12.7 12.1  --  10.1*  HCT 35.5* 34.8*  --  29.9*  PLT 100* 90*  --  79*  LABPROT  --   --  15.3* 15.0  INR  --   --  1.23 1.20  CREATININE 0.77 0.80  --  0.85    Estimated Creatinine Clearance: 38.7 ml/min (by C-G formula based on Cr of 0.85).   Assessment: Patient is an 79 y.o F on anticoagulation for VTE prophylaxis.  INR is subtherapeutic at 1.20.  Hgb and plt continue to drop to 10.1 and 79, respectively.  This may be secondary to ABLA, but will watch since patient is also on lovenox.  Goal of Therapy:  INR 2-3    Plan:  1) repeat coumadin 2.5mg  PO x1 today 2) monitor cbc and s/s of bleeding  Vaughn Beaumier P 02/20/2013,11:46 AM

## 2013-02-20 NOTE — Evaluation (Signed)
Physical Therapy Evaluation Patient Details Name: Lauren Avery MRN: [id number] DOB: 30-Dec-1926 Today's Date: 02/20/2013 Time: 1191-4782 PT Time Calculation (min): 33 min  PT Assessment / Plan / Recommendation Clinical Impression  pt rpesents with R elbow fx and multiple pelvic fxs and per radiology report has L mildly displaced pubic rami fxs.  Pelvic fxs are not mentioned in the Ortho notes and no WBing status ordered for LEs.  Please clarify activity allowed and any WBing restrictions for LEs.      PT Assessment  Patient needs continued PT services    Follow Up Recommendations  SNF    Does the patient have the potential to tolerate intense rehabilitation      Barriers to Discharge None      Equipment Recommendations  None recommended by PT    Recommendations for Other Services     Frequency Min 3X/week    Precautions / Restrictions Precautions Precautions: Fall Restrictions Weight Bearing Restrictions: Yes RUE Weight Bearing: Non weight bearing Other Position/Activity Restrictions: No orders written or notes written addressing mobility and WBing related to Pelvic fxs.     Pertinent Vitals/Pain Indicates pain with mobility.  RN medicated while PT in room.        Mobility  Bed Mobility Bed Mobility: Supine to Sit;Sitting - Scoot to Edge of Bed;Sit to Supine Supine to Sit: 1: +2 Total assist;With rails Supine to Sit: Patient Percentage: 30% Sitting - Scoot to Edge of Bed: 1: +2 Total assist Sitting - Scoot to Edge of Bed: Patient Percentage: 0% Sit to Supine: 1: +2 Total assist Sit to Supine: Patient Percentage: 10% Details for Bed Mobility Assistance: cues for sequencing, use of L UE and sequencing.   Transfers Transfers: Not assessed Ambulation/Gait Ambulation/Gait Assistance: Not tested (comment) Stairs: No Wheelchair Mobility Wheelchair Mobility: No    Exercises     PT Diagnosis: Generalized weakness;Acute pain  PT Problem List: Decreased  strength;Decreased range of motion;Decreased activity tolerance;Decreased balance;Decreased mobility;Decreased knowledge of use of DME;Decreased knowledge of precautions;Pain PT Treatment Interventions: DME instruction;Gait training;Stair training;Functional mobility training;Therapeutic activities;Therapeutic exercise;Balance training;Patient/family education   PT Goals Acute Rehab PT Goals PT Goal Formulation: With patient Time For Goal Achievement: 03/06/13 Potential to Achieve Goals: Fair Pt will go Supine/Side to Sit: with min assist PT Goal: Supine/Side to Sit - Progress: Goal set today Pt will go Sit to Supine/Side: with min assist PT Goal: Sit to Supine/Side - Progress: Goal set today Pt will go Sit to Stand: with min assist PT Goal: Sit to Stand - Progress: Goal set today Pt will go Stand to Sit: with min assist PT Goal: Stand to Sit - Progress: Goal set today Pt will Transfer Bed to Chair/Chair to Bed: with min assist PT Transfer Goal: Bed to Chair/Chair to Bed - Progress: Goal set today  Visit Information  Last PT Received On: 02/20/13 Assistance Needed: +2    Subjective Data  Subjective: My left wrist isn't good either.   Patient Stated Goal: Heal.     Prior Functioning  Home Living Lives With: Family Available Help at Discharge: Skilled Nursing Facility Home Adaptive Equipment: Walker - rolling Prior Function Level of Independence: Needs assistance Needs Assistance: Meal Prep;Light Housekeeping Meal Prep: Maximal Light Housekeeping: Maximal Communication Communication: No difficulties    Cognition  Cognition Overall Cognitive Status: Appears within functional limits for tasks assessed/performed Arousal/Alertness: Awake/alert Orientation Level: Appears intact for tasks assessed Behavior During Session: [facility] for tasks performed    Extremity/Trunk Assessment Right Lower Extremity  Assessment RLE ROM/Strength/Tone: Unable to fully assess;Due to pain RLE  Sensation: WFL - Light Touch Left Lower Extremity Assessment LLE ROM/Strength/Tone: Unable to fully assess;Due to pain LLE Sensation: WFL - Light Touch Trunk Assessment Trunk Assessment: Normal   Balance Balance Balance Assessed: Yes Static Sitting Balance Static Sitting - Balance Support: Left upper extremity supported;Feet supported Static Sitting - Level of Assistance: 5: Stand by assistance  End of Session PT - End of Session Activity Tolerance: Patient tolerated treatment well Patient left: in bed;with call bell/phone within reach Nurse Communication: Mobility status  GP     Sunny Schlein, [place] [phone] 02/20/2013, 12:29 PM

## 2013-02-21 ENCOUNTER — Encounter (HOSPITAL_COMMUNITY): Payer: Self-pay | Admitting: Orthopedic Surgery

## 2013-02-21 LAB — GLUCOSE, CAPILLARY
Glucose-Capillary: 159 mg/dL — ABNORMAL HIGH (ref 70–99)
Glucose-Capillary: 203 mg/dL — ABNORMAL HIGH (ref 70–99)

## 2013-02-21 LAB — PROTIME-INR
INR: 1.18 (ref 0.00–1.49)
Prothrombin Time: 14.8 seconds (ref 11.6–15.2)

## 2013-02-21 MED ORDER — INSULIN ASPART 100 UNIT/ML ~~LOC~~ SOLN: 0.0000 [IU] | Freq: Every day | SUBCUTANEOUS | Status: DC

## 2013-02-21 MED ORDER — ACETAMINOPHEN 650 MG RE SUPP: 650.0000 mg | Freq: Four times a day (QID) | RECTAL | Status: DC | PRN

## 2013-02-21 MED ORDER — ACETAMINOPHEN 325 MG PO TABS: 650.0000 mg | ORAL_TABLET | Freq: Four times a day (QID) | ORAL | Status: DC | PRN

## 2013-02-21 MED ORDER — BISACODYL 10 MG RE SUPP: 10.0000 mg | Freq: Every day | RECTAL | Status: DC | PRN

## 2013-02-21 MED ORDER — SENNA 8.6 MG PO TABS: 1.0000 | ORAL_TABLET | Freq: Two times a day (BID) | ORAL | Status: DC

## 2013-02-21 MED ORDER — WARFARIN SODIUM 5 MG PO TABS
5.0000 mg | ORAL_TABLET | Freq: Once | ORAL | Status: DC
  Filled 2013-02-21: qty 1

## 2013-02-21 MED ORDER — ENOXAPARIN SODIUM 40 MG/0.4ML ~~LOC~~ SOLN: 40.0000 mg | SUBCUTANEOUS | Status: DC

## 2013-02-21 MED ORDER — INSULIN ASPART 100 UNIT/ML ~~LOC~~ SOLN: 0.0000 [IU] | Freq: Three times a day (TID) | SUBCUTANEOUS | Status: DC

## 2013-02-21 MED ORDER — POLYETHYLENE GLYCOL 3350 17 G PO PACK: 17.0000 g | PACK | Freq: Every day | ORAL | Status: DC

## 2013-02-21 MED ORDER — HYDROCODONE-ACETAMINOPHEN 5-325 MG PO TABS: 1.0000 | ORAL_TABLET | ORAL | Status: DC | PRN

## 2013-02-21 MED ORDER — WARFARIN - PHARMACIST DOSING INPATIENT: 5.0000 | Freq: Every day | Status: DC

## 2013-02-21 MED ORDER — WARFARIN SODIUM 5 MG PO TABS: 5.0000 mg | ORAL_TABLET | Freq: Once | ORAL | Status: DC

## 2013-02-21 MED ORDER — ONDANSETRON HCL 4 MG PO TABS: 4.0000 mg | ORAL_TABLET | Freq: Four times a day (QID) | ORAL | Status: DC | PRN

## 2013-02-21 MED ORDER — POLYETHYLENE GLYCOL 3350 17 G PO PACK
17.0000 g | PACK | Freq: Every day | ORAL | Status: DC
Start: 2013-02-21 — End: 2013-02-21
  Administered 2013-02-21: 17 g via ORAL
  Filled 2013-02-21 (×2): qty 1

## 2013-02-21 NOTE — Discharge Summary (Signed)
DISCHARGE SUMMARY  Lauren Avery  MR#: [id number]  DOB:1927-01-25  Date of Admission: 02/17/2013 Date of Discharge: 02/21/2013  Attending Physician:Annelyse Rey W  Patient's [name],[name], MD  Consults:  [facility] Discharge Diagnoses: Principal Problem:   Fracture of elbow, medial condyle, right, closed Active Problems:   Closed fracture of single pubic ramus of pelvis   Bilateral sacral insufficiency fracture Pulmonary fibrosis, idiopathic Osteoporosis, with recent Reclast 2014,  CKD Stage 3 HTN Osteoarthritis GERD  Discharge Medications:   Medication List    TAKE these medications       acetaminophen 325 MG tablet  Commonly known as:  TYLENOL  Take 2 tablets (650 mg total) by mouth every 6 (six) hours as needed.     acetaminophen 650 MG suppository  Commonly known as:  TYLENOL  Place 1 suppository (650 mg total) rectally every 6 (six) hours as needed.     amLODipine 5 MG tablet  Commonly known as:  NORVASC  Take 5 mg by mouth daily.     bisacodyl 10 MG suppository  Commonly known as:  DULCOLAX  Place 1 suppository (10 mg total) rectally daily as needed.     calcium citrate-vitamin D 315-200 MG-UNIT per tablet  Commonly known as:  CITRACAL+D  Take 1 tablet by mouth 2 (two) times daily.     cetirizine 10 MG tablet  Commonly known as:  ZYRTEC  Take 10 mg by mouth daily.     CITRACAL PO  Take by mouth daily.     enoxaparin 40 MG/0.4ML injection  Commonly known as:  LOVENOX  Inject 0.4 mLs (40 mg total) into the skin daily.     glimepiride 2 MG tablet  Commonly known as:  AMARYL  Take 2 mg by mouth daily before breakfast.     HYDROcodone-acetaminophen 5-325 MG per tablet  Commonly known as:  NORCO/VICODIN  Take 1-2 tablets by mouth every 4 (four) hours as needed.     insulin aspart 100 UNIT/ML injection  Commonly known as:  novoLOG  Inject 0-5 Units into the skin at bedtime.     insulin aspart 100 UNIT/ML injection  Commonly  known as:  novoLOG  Inject 0-9 Units into the skin 3 (three) times daily with meals.     losartan 50 MG tablet  Commonly known as:  COZAAR  Take 50 mg by mouth daily at 12 noon. With lunch     multivitamin with minerals tablet  Take 1 tablet by mouth daily.     omeprazole 20 MG capsule  Commonly known as:  PRILOSEC  Take 1 capsule every morning before breakfast     ondansetron 4 MG tablet  Commonly known as:  ZOFRAN  Take 1 tablet (4 mg total) by mouth every 6 (six) hours as needed for nausea.     polyethylene glycol packet  Commonly known as:  MIRALAX / GLYCOLAX  Take 17 g by mouth daily.     RECLAST IV  Inject into the vein. yearly     senna 8.6 MG Tabs  Commonly known as:  SENOKOT  Take 1 tablet (8.6 mg total) by mouth 2 (two) times daily.     Warfarin - Pharmacist Dosing Inpatient Misc  5 each by Does not apply route daily at 6 PM.       ANTICOAGULATION CONSULT NOTE - Follow Up Consult  Pharmacy Consult for coumadin  Indication: VTE prophylaxis  Allergies   Allergen  Reactions   .  Hydrochlorothiazide  Other (See Comments)  Taken from faxed notes of dr Wylene Simmer (to short stay) THROMBOCYTOPENIA    Patient Measurements:  Height: 5' 1.81" (157 cm)  Weight: 125 lb (56.7 kg)  IBW/kg (Calculated) : 49.67  Vital Signs:  Temp: 98 F (36.7 C) (03/23 0634)  BP: 130/46 mmHg (03/23 0634)  Pulse Rate: 77 (03/23 0634)  Labs:   Recent Labs   02/17/13 1855  02/18/13 0505  02/19/13 0547  02/20/13 0555   HGB  12.7  12.1  --  10.1*   HCT  35.5*  34.8*  --  29.9*   PLT  100*  90*  --  79*   LABPROT  --  --  15.3*  15.0   INR  --  --  1.23  1.20   CREATININE  0.77  0.80  --  0.85    Estimated Creatinine Clearance: 38.7 ml/min (by C-G formula based on Cr of 0.85).  Assessment:  Patient is an 79 y.o F on anticoagulation for VTE prophylaxis. INR is subtherapeutic at 1.20. Hgb and plt continue to drop to 10.1 and 79, respectively. This may be secondary to ABLA, but  will watch since patient is also on lovenox.  Goal of Therapy:  INR 2-3  Plan:  1) repeat coumadin 2.5mg  PO x1 today  2) monitor cbc and s/s of bleeding  Lauren Avery  02/20/2013,11:46 AM   OPERATIVE REPORT  DATE OF SURGERY: 02/18/2013  PATIENT: Lauren Avery, 79 y.o. female  PRE-OPERATIVE DIAGNOSIS: Elbow fracture  POST-OPERATIVE DIAGNOSIS: Elbow fracture  PROCEDURE: Procedure(s):  OPEN REDUCTION INTERNAL FIXATION (ORIF) ELBOW/OLECRANON FRACTURE  SURGEON: Surgeon(s):  Nadara Mustard, MD  ANESTHESIA: general  EBL: min ML  SPECIMEN: No Specimen  TOURNIQUET: * No tourniquets in log *  PROCEDURE DETAILS:  Patient is an 79 year old woman who fell sustaining a supracondylar intercondylar right humerus fracture at the elbow. Patient had an unstable elbow fracture and presents at this time for open reduction internal fixation. Risks and benefits were discussed including infection neurovascular injury arthritis failure of fixation need for additional surgery. Patient and her family state they understand and wish to proceed at this time. Description of procedure patient brought to the operating room and underwent a general anesthetic. After adequate levels of anesthesia were obtained patient's right upper extremity was prepped using DuraPrep draped into a sterile field. A longitudinal medial incision was made over the medial epicondyle. Care was taken to protect the ulnar nerve. This was carried down to the fracture site the fracture was freshened and the fracture was held stabilized with a K wire. Using a contoured medial epicondyle plate the plate was secured with locking screws distally and compression screws proximally. C-arm fluoroscopy verified congruence of the joint. Patient had full extension flexion supination and pronation. The wound was irrigated normal saline subcutaneous is closed using 2-0 Vicryl skin was closed using staples. Wound is covered with Adaptic orthopedic sponges AB dressing  Kerlix and Coban. Patient was extubated placed in a sling taken to the PACU in stable condition.  PLAN OF CARE: Admit to inpatient  PATIENT DISPOSITION: PACU - hemodynamically stable.  Nadara Mustard, MD  02/18/2013  4:15 PM  Hospital Procedures: Dg Lumbar Spine Complete  02/17/2013  *RADIOLOGY REPORT*  Clinical Data: Fall with lower back pain  LUMBAR SPINE - COMPLETE 4+ VIEW  Comparison: Lumbar spine radiographs 11/10/2012  Findings: The bones are diffusely osteopenic.  There are five lumbar-type vertebral bodies. Minimal retrolisthesis of L2 on L3 is stable.  There is  stable height loss of the inferior endplates of the T12 and L1 vertebral bodies.  There is mild superior endplate height loss of the L2 vertebral body, stable.  No acute lumbar spine compression deformity is identified.  Postoperative changes right hip arthroplasty.  IMPRESSION: 1.  Mild chronic compression deformities of T12, L1, and L2.  These appear unchanged compared to lumbar spine radiographs 11/10/2012. 2.  No definite acute bony abnormality is identified.   Original Report Authenticated By: Britta Mccreedy, M.D.    Dg Pelvis 1-2 Views  02/17/2013  *RADIOLOGY REPORT*  Clinical Data: Larey Seat.  Pelvic pain.  PELVIS - 1-2 VIEW  Comparison: CT scan 11/18/2012  Findings: There is a bipolar hip prosthesis on the right. Moderate lucency along the bone cement interface medially appears relatively stable.  No acute fracture is seen.  The left hip is normally located.  No acute left hip fracture.  The pubic symphysis and SI joints are grossly normal.  No definite pelvic fractures.  IMPRESSION:  1.  Moderate lucency at the bone cement interface of the right hip prosthesis.  No acute fracture. 2.  No left hip or pelvic fractures.   Original Report Authenticated By: Rudie Meyer, M.D.    Dg Shoulder Right  02/17/2013  *RADIOLOGY REPORT*  Clinical Data: Larey Seat.  Right shoulder pain.  RIGHT SHOULDER - 2+ VIEW  Comparison: None  Findings: The joint  spaces are maintained.  No acute fracture. Chronic-appearing interstitial disease noted the right lung.  IMPRESSION: No acute fracture.   Original Report Authenticated By: Rudie Meyer, M.D.    Dg Elbow 2 Views Right  02/17/2013  *RADIOLOGY REPORT*  Clinical Data: Larey Seat.  Right elbow pain.  RIGHT ELBOW - 2 VIEW  Comparison: None  Findings: There is a complex comminuted mainly longitudinal fracture splitting the distal humerus at the elbow joint.  The radius and ulna are intact.  IMPRESSION: Displaced intra-articular fracture of the distal humerus.   Original Report Authenticated By: Rudie Meyer, M.D.    Ct Head Wo Contrast  02/17/2013  *RADIOLOGY REPORT*  Clinical Data: Larey Seat.  Hit head.  CT HEAD WITHOUT CONTRAST  Technique:  Contiguous axial images were obtained from the base of the skull through the vertex without contrast.  Comparison: None  Findings: The ventricles are normal.  No extra-axial fluid collections are seen.  The brainstem and cerebellum are unremarkable.  No acute intracranial findings such as infarction or hemorrhage.  No mass lesions.  The bony calvarium is intact.  The visualized paranasal sinuses and mastoid air cells are clear.  IMPRESSION: No acute intracranial findings or skull fracture.   Original Report Authenticated By: Rudie Meyer, M.D.    Ct Pelvis Wo Contrast  02/17/2013  *RADIOLOGY REPORT*  Clinical Data: Complains of left hip pain and recent fall.  CT PELVIS WITHOUT CONTRAST  Technique:  Multidetector CT imaging of the pelvis was performed following the standard protocol without intravenous contrast.  Comparison: CT 11/18/2012  Findings: There are subtle bilateral sacral ala fractures. Minimally displaced fractures of the left superior and inferior pubic rami.  The right hip replacement is located.  The left hip is located.  No evidence for an acetabular fracture.  No gross abnormality to the uterus or adnexal structures.  No evidence for free fluid. There is asymmetry of the  pelvic musculature, left side greater than right, and difficult to exclude any soft tissue swelling on the left side.  IMPRESSION: Mildly displaced fractures of the left pubic rami.  Bilateral sacral  fractures.   Original Report Authenticated By: Richarda Overlie, M.D.    Dg Shoulder Left  02/17/2013  *RADIOLOGY REPORT*  Clinical Data: Left shoulder pain.  LEFT SHOULDER - 2+ VIEW  Comparison: Chest x-ray 02/09/2012.  Findings: There is an abnormality involving the distal clavicle but I think this is unchanged when compared the prior chest x-ray in this likely a remote distal clavicle fracture.  The [facility] joint is intact and the glenohumeral joint is intact.  There are glenohumeral joint degenerative changes with inferior spurring from the glenoid but no acute fracture. Remote healed rib fractures are noted on the left.  IMPRESSION: Evidence of remote trauma involving the distal clavicle and left base.  No acute fracture.   Original Report Authenticated By: Rudie Meyer, M.D.    Dg Humerus Left  02/19/2013  *RADIOLOGY REPORT*  Clinical Data: Fall.  Pain  LEFT HUMERUS - 2+ VIEW  Comparison: Left shoulder 02/17/2013  Findings: Negative for fracture of the humerus.  Mild degenerative change of the proximal humerus due to rotator cuff disease.  Chronic left rib fracture.  IMPRESSION: Negative for acute fracture.   Original Report Authenticated By: Janeece Riggers, M.D.     History of Present Illness: Patient is an 79 year old female with known osteoporosis and falls at home who fell late last week with resulting R elbow and sacral fractures  Hospital Course: Lauren Avery was admitted by my medicine service for continued management.  She is widowed but has a daughter who comes to visit her.  She was found to have a R elbow fracture, seen by Dr. Lajoyce Corners and underwent surgery on Friday.  She was also to be non weight bearing due to the pelvic fractures and Coumadin was initiated postop, with DVT prophylaxis per Lovenox.  Her R arm  remains in a sling with some hand swelling.  She also had an IV infiltrate in her L arm.  She decided late on Friday that she preferred to go to [facility] and social work is currently working on getting her to that facility today, with her pain meds and FL-2 signed.  She is otherwise medically stable and was switched to a sliding scale insulin for dosing convenience.  She only complains of some constipation today.  Day of Discharge Exam BP 129/53  Pulse 88  Temp(Src) 98.4 F (36.9 C) (Oral)  Resp 15  Ht 5' 1.81" (1.57 m)  Wt 56.7 kg (125 lb)  BMI 23 kg/m2  SpO2 93%  Physical Exam: General appearance: alert, cooperative and appears stated age Eyes: no scleral icterus Throat: oropharynx moist without erythema Resp: clear to auscultation bilaterally Cardio: regular rate and rhythm, S1, S2 normal, no murmur, click, rub or gallop GI: soft, non-tender; bowel sounds normal; no masses,  no organomegaly Extremities: no clubbing, cyanosis or edema  Discharge Labs:  Recent Labs  02/20/13 0555  NA 138  K 4.1  CL 104  CO2 28  GLUCOSE 147*  BUN 21  CREATININE 0.85  CALCIUM 9.0    Recent Labs  02/20/13 0555  WBC 12.2*  HGB 10.1*  HCT 29.9*  MCV 87.2  PLT 79*   Lab Results  Component Value Date   INR 1.20 02/20/2013   INR 1.23 02/19/2013   Discharge instructions:      Future Appointments Provider Department Dept Phone   02/25/2013 8:00 AM [facility] [phone]   02/25/2013 11:00 AM [facility] [phone]   03/16/2013  10:00 AM [facility] [phone]   03/16/2013 11:00 AM Barbaraann Share, MD [facility] [phone]      Disposition: To [facility] when Bed Available  Follow-up Appts: Follow-up with Dr. Wylene Simmer at [facility] 1 week post discharge from [facility] to arrange postoperative followup for  patient and handle decision making regarding weight bearing and PT/OT needs. Condition on Discharge: Stable  Tests Needing Follow-up: Coumadin per pharmacy consult at facility with INR goal 2-3, Should be rechecked and adjusted on Wed 02/23/2013.  Bridge with Lovenox until therapeutic.  Signed: Gaspar Garbe 02/21/2013, 8:01 AM

## 2013-02-21 NOTE — Progress Notes (Signed)
OT Cancellation Note  Patient Details Name: [name] MRN: [id number] DOB: 05/25/1927   Cancelled Treatment:    Reason Eval/Treat Not Completed: Other (comment) Pt to be D/C ed to SNF . Defer OT to SNF. Please reorder if needed. [facility] Zabella Wease, OTR/L  [phone] 02/21/2013 02/21/2013, 2:13 PM

## 2013-02-21 NOTE — Progress Notes (Signed)
Subjective: 3 Days Post-Op Procedure(s) (LRB): OPEN REDUCTION INTERNAL FIXATION (ORIF) ELBOW/OLECRANON FRACTURE (Right) Patient reports pain as mild.   Asked by nursing staff to see pt for questions in regard to weight bearing status and elbow dressing.   Spoke at length with pt and daughter today.  They anticipate discharge to NH today  Objective: Vital signs in last 24 hours: Temp:  [98.2 F (36.8 C)-98.5 F (36.9 C)] 98.4 F (36.9 C) (03/24 0623) Pulse Rate:  [86-96] 88 (03/24 0623) Resp:  [15-18] 15 (03/24 0623) BP: (117-149)/(52-72) 129/53 mmHg (03/24 0623) SpO2:  [93 %-95 %] 93 % (03/24 0623)  Intake/Output from previous day: 03/23 0701 - 03/24 0700 In: 480 [P.O.:480] Out: 975 [Urine:975] Intake/Output this shift: Total I/O In: 240 [P.O.:240] Out: -    Recent Labs  02/20/13 0555  HGB 10.1*    Recent Labs  02/20/13 0555  WBC 12.2*  RBC 3.43*  HCT 29.9*  PLT 79*    Recent Labs  02/20/13 0555  NA 138  K 4.1  CL 104  CO2 28  BUN 21  CREATININE 0.85  GLUCOSE 147*  CALCIUM 9.0    Recent Labs  02/20/13 0555 02/21/13 0725  INR 1.20 1.18    Neurovascular intact swelling of right hand.  dependent edema from lack of elevation.  discussed appropriiate elevation with pt.  Cap refill and sensation of fingers intact.  Moving fingers well.  Assessment/Plan: 3 Days Post-Op Procedure(s) (LRB): OPEN REDUCTION INTERNAL FIXATION (ORIF) ELBOW/OLECRANON FRACTURE (Right) Discharge to SNF Pt may be WBAT for transfers bed to chair only.  No ambulation as she will not be able to weight bear through elbow.  Also has hemiarthroplasty of right hip which possibly has a stress fracture and Bone Scan is pending. Dressing to right elbow should remain intact and she will elevate hand above heart and move fingers frequently for edema control. OV 10-14 days with Dr Lajoyce Corners.  F/U with Dr Cleophas Dunker for right hip as scheduled and daughter will call his office to let him know of  the situation and NHP. Coumadin for VTE prophylaxis Nuh Lipton M 02/21/2013, 12:31 PM

## 2013-02-21 NOTE — Progress Notes (Signed)
ANTICOAGULATION CONSULT NOTE - Follow Up Consult  Pharmacy Consult for Coumadin Indication: VTE prophylaxis  Allergies  Allergen Reactions  . Hydrochlorothiazide Other (See Comments)    Taken from faxed notes of dr Wylene Simmer (to short stay)  THROMBOCYTOPENIA    Patient Measurements: Height: 5' 1.81" (157 cm) Weight: 125 lb (56.7 kg) IBW/kg (Calculated) : 49.67 Heparin Dosing Weight:   Vital Signs: Temp: 98.4 F (36.9 C) (03/24 0623) Temp src: Oral (03/24 0623) BP: 129/53 mmHg (03/24 0623) Pulse Rate: 88 (03/24 0623)  Labs:  Recent Labs  02/19/13 0547 02/20/13 0555 02/21/13 0725  HGB  --  10.1*  --   HCT  --  29.9*  --   PLT  --  79*  --   LABPROT 15.3* 15.0 14.8  INR 1.23 1.20 1.18  CREATININE  --  0.85  --     Estimated Creatinine Clearance: 38.7 ml/min (by C-G formula based on Cr of 0.85).  Assessment: 86yof on Coumadin for VTE prophylaxis s/p ortho procedure. INR (1.18) is subtherapeutic and is not responding to Coumadin 2.5mg  doses. Noted plans for possible discharge with anticoagulation management per facility. Will order Coumadin 5mg  tonight in case discharge is delayed. Patient continues on Lovenox 40mg  daily until INR is therapeutic.  - No CBC this AM  - No significant bleeding reported  Goal of Therapy:  INR 2-3    Plan:  1. Coumadin 5mg  po x 1 tonight if patient is still here 2. Follow-up AM INR  Cleon Dew [phone] 02/21/2013,12:12 PM

## 2013-02-22 ENCOUNTER — Non-Acute Institutional Stay (SKILLED_NURSING_FACILITY): Payer: Medicare Other | Admitting: Nurse Practitioner

## 2013-02-22 ENCOUNTER — Other Ambulatory Visit: Payer: Self-pay | Admitting: Geriatric Medicine

## 2013-02-22 DIAGNOSIS — I999 Unspecified disorder of circulatory system: Secondary | ICD-10-CM

## 2013-02-22 DIAGNOSIS — Z7901 Long term (current) use of anticoagulants: Secondary | ICD-10-CM

## 2013-02-22 MED ORDER — HYDROCODONE-ACETAMINOPHEN 5-325 MG PO TABS: ORAL_TABLET | ORAL | Status: DC

## 2013-02-23 ENCOUNTER — Encounter: Payer: Self-pay | Admitting: Nurse Practitioner

## 2013-02-23 MED ORDER — WARFARIN SODIUM 3 MG PO TABS: 3.0000 mg | ORAL_TABLET | Freq: Once | ORAL | Status: DC

## 2013-02-23 NOTE — Progress Notes (Signed)
Patient ID: Lauren Avery, female   DOB: August 17, 1927, 79 y.o.   MRN: [id number] Subjective:    On coumadin and Lovenox. Indication: DVT prophylaxis, s/p multiple fractures (elbow, sacral, pelvis) Bleeding signs/symptoms: None Thromboembolic signs/symptoms: None  Missed Coumadin doses: None Medication changes: no Dietary changes: no Bacterial/viral infection: no Other concerns: no  The following portions of the patient's history were reviewed and updated as appropriate: allergies, current medications, past family history, past medical history, past social history, past surgical history and problem list.  Review of Systems A comprehensive review of systems was negative.   Objective:    INR Today: 1.2 Current dose: 2.5mg   Assessment:    Subtherapeutic INR for goal of 2-3  On Lovenox for sub-therapeutic INR.   Plan:    1. New dose: increase to 3mg  and continue Lovenox 40mg  sq qd.   2. Next INR: in am

## 2013-02-23 NOTE — Progress Notes (Signed)
This encounter was created in error - please disregard.  This encounter was created in error - please disregard.

## 2013-02-24 ENCOUNTER — Non-Acute Institutional Stay (SKILLED_NURSING_FACILITY): Payer: Medicare Other | Admitting: Internal Medicine

## 2013-02-24 ENCOUNTER — Encounter: Payer: Self-pay | Admitting: Internal Medicine

## 2013-02-24 ENCOUNTER — Encounter: Payer: Self-pay | Admitting: Nurse Practitioner

## 2013-02-24 DIAGNOSIS — K5900 Constipation, unspecified: Secondary | ICD-10-CM

## 2013-02-24 DIAGNOSIS — K219 Gastro-esophageal reflux disease without esophagitis: Secondary | ICD-10-CM

## 2013-02-24 DIAGNOSIS — S42461A Displaced fracture of medial condyle of right humerus, initial encounter for closed fracture: Secondary | ICD-10-CM

## 2013-02-24 DIAGNOSIS — I10 Essential (primary) hypertension: Secondary | ICD-10-CM

## 2013-02-24 DIAGNOSIS — S32509A Unspecified fracture of unspecified pubis, initial encounter for closed fracture: Secondary | ICD-10-CM

## 2013-02-24 DIAGNOSIS — S42463A Displaced fracture of medial condyle of unspecified humerus, initial encounter for closed fracture: Secondary | ICD-10-CM

## 2013-02-24 DIAGNOSIS — K5909 Other constipation: Secondary | ICD-10-CM | POA: Insufficient documentation

## 2013-02-24 DIAGNOSIS — E119 Type 2 diabetes mellitus without complications: Secondary | ICD-10-CM

## 2013-02-24 NOTE — Assessment & Plan Note (Addendum)
S/p medical management and is WBAT. On lovenox and coumadin for dvt prophylaxis. Coumadin 3 mg daily at present and pending inr check for today. Goal inr 2-3.On hydrocodone-apap 5-325 1-2 tab q4h prn to help with pain and undergoing therapy sessions with PT/OT. Fall precautions

## 2013-02-24 NOTE — Assessment & Plan Note (Signed)
Continue prilosec and monitor symptoms

## 2013-02-24 NOTE — Assessment & Plan Note (Signed)
S/p orif and has a sling. To follow with orthopedic. On cetracel with MVI

## 2013-02-24 NOTE — Assessment & Plan Note (Signed)
Continue amaryl and monitor cbg

## 2013-02-24 NOTE — Progress Notes (Signed)
Patient ID: Lauren Avery, female   DOB: 03/08/1927, 79 y.o.   MRN: [id number]    PCP: Gaspar Garbe, MD  Code Status: full code  Allergies  Allergen Reactions  . Hydrochlorothiazide Other (See Comments)    Taken from faxed notes of dr Wylene Simmer (to short stay)  THROMBOCYTOPENIA    Chief Complaint: new admit post hospitalization 02/17/13- 02/21/13  HPI:  79 y/o female patient was admitted to hospital post mechanical fall on above date with right humerus fracture at the elbow. She underwent ORIF for this and had a sling placed. She also had sacral fracture. She was followed by orthopedic inpatient and was sent to SNF for STR. Goal is for pt to return home She was seen in her room today with her daughter present. She is in no distress. Her pain is under control. She has nuclear scan of her old hip fracture tomorrow and has ortho follow up coming up. She denies any complaints. See ros  Review of Systems: Review of Systems  Constitutional: Negative for fever, chills and diaphoresis.  Eyes: Negative for blurred vision.  Respiratory: Negative for cough and shortness of breath.   Cardiovascular: Negative for chest pain, palpitations and leg swelling.  Gastrointestinal: Negative for heartburn and abdominal pain.  Genitourinary: Negative for dysuria.  Musculoskeletal: Positive for falls.       Weakness and mobility limitation present  Skin: Negative for itching and rash.  Neurological: Negative for dizziness, seizures, weakness and headaches.  Psychiatric/Behavioral: Negative for depression.     Past Medical History  Diagnosis Date  . Osteoporosis   . Pulmonary fibrosis   . GERD (gastroesophageal reflux disease)   . Fracture of left clavicle     12/12 wore a sling  . Hypertension   . Diabetes mellitus   . Fracture 10/2012    left wrist   Past Surgical History  Procedure Laterality Date  . Hip fracture surgery      1984 pin and plate placed right  . Hip surgery      pin  and plate removed 03/980  . Femur fracture surgery       6/08 partial hip replacement right  . Ganglion cyst removal      left wrist 1960  . Orif elbow fracture Right 02/18/2013    Procedure: OPEN REDUCTION INTERNAL FIXATION (ORIF) ELBOW/OLECRANON FRACTURE;  Surgeon: Nadara Mustard, MD;  Location: [facility] OR;  Service: Orthopedics;  Laterality: Right;   Social History:   reports that she has never smoked. She does not have any smokeless tobacco history on file. She reports that she does not drink alcohol or use illicit drugs.  Family History  Problem Relation Age of Onset  . Pancreatic cancer Brother   . Stroke Mother   . Diabetes Father     Medications: Patient's Medications  New Prescriptions   No medications on file  Previous Medications   ACETAMINOPHEN (TYLENOL) 325 MG TABLET    Take 2 tablets (650 mg total) by mouth every 6 (six) hours as needed.   ACETAMINOPHEN (TYLENOL) 650 MG SUPPOSITORY    Place 1 suppository (650 mg total) rectally every 6 (six) hours as needed.   AMLODIPINE (NORVASC) 5 MG TABLET    Take 5 mg by mouth daily.    BISACODYL (DULCOLAX) 10 MG SUPPOSITORY    Place 1 suppository (10 mg total) rectally daily as needed.   CALCIUM CITRATE (CITRACAL PO)    Take by mouth daily.   CALCIUM CITRATE-VITAMIN D (CITRACAL+D)  315-200 MG-UNIT PER TABLET    Take 1 tablet by mouth 2 (two) times daily.   CETIRIZINE (ZYRTEC) 10 MG TABLET    Take 10 mg by mouth daily.   ENOXAPARIN (LOVENOX) 40 MG/0.4ML INJECTION    Inject 0.4 mLs (40 mg total) into the skin daily.   GLIMEPIRIDE (AMARYL) 2 MG TABLET    Take 2 mg by mouth daily before breakfast.   HYDROCODONE-ACETAMINOPHEN (NORCO/VICODIN) 5-325 MG PER TABLET    Take one tablet by mouth every 4 hours as needed for mild pain. Take 2 tablets by mouth every 4 hours as needed for moderate to severe pain.   INSULIN ASPART (NOVOLOG) 100 UNIT/ML INJECTION    Inject 0-5 Units into the skin at bedtime.   INSULIN ASPART (NOVOLOG) 100 UNIT/ML INJECTION     Inject 0-9 Units into the skin 3 (three) times daily with meals.   LOSARTAN (COZAAR) 50 MG TABLET    Take 50 mg by mouth daily at 12 noon. With lunch   MULTIPLE VITAMINS-MINERALS (MULTIVITAMIN WITH MINERALS) TABLET    Take 1 tablet by mouth daily.   OMEPRAZOLE (PRILOSEC) 20 MG CAPSULE    Take 1 capsule every morning before breakfast   ONDANSETRON (ZOFRAN) 4 MG TABLET    Take 1 tablet (4 mg total) by mouth every 6 (six) hours as needed for nausea.   POLYETHYLENE GLYCOL (MIRALAX / GLYCOLAX) PACKET    Take 17 g by mouth daily.   SENNA (SENOKOT) 8.6 MG TABS    Take 1 tablet (8.6 mg total) by mouth 2 (two) times daily.   WARFARIN (COUMADIN) 3 MG TABLET    Take 1 tablet (3 mg total) by mouth one time only at 6 PM.   ZOLEDRONIC ACID (RECLAST IV)    Inject into the vein. yearly  Modified Medications   No medications on file  Discontinued Medications   No medications on file     Physical Exam: Filed Vitals:   02/24/13 1643  BP: 131/52  Pulse: 82  Temp: 98.1 F (36.7 C)  Resp: 16  Height: 5\' 2"  (1.575 m)  Weight: 125 lb (56.7 kg)  SpO2: 98%   Physical Exam  Constitutional: She is oriented to person, place, and time. She appears well-developed. No distress.  HENT:  Head: Normocephalic.  Mouth/Throat: Oropharynx is clear and moist.  Eyes: Conjunctivae are normal. Pupils are equal, round, and reactive to light.  Neck: Normal range of motion. Neck supple.  Cardiovascular: Normal rate and regular rhythm.   Pulmonary/Chest: Effort normal and breath sounds normal.  Abdominal: Soft. Bowel sounds are normal.  Musculoskeletal: She exhibits tenderness. She exhibits no edema.  Right arm in sling, able to move her fingers fine, tenderness in left hip joint and pelvis area, on air mattress  Neurological: She is alert and oriented to person, place, and time.  Skin: Skin is warm and dry. She is not diaphoretic. No pallor.  Psychiatric: She has a normal mood and affect.     Labs  reviewed: Basic Metabolic Panel:  Recent Labs  47/82/95 1855 02/18/13 0505 02/20/13 0555  NA 139 136 138  K 4.2 3.7 4.1  CL 103 102 104  CO2 22 24 28   GLUCOSE 176* 111* 147*  BUN 12 13 21   CREATININE 0.77 0.80 0.85  CALCIUM 9.8 9.1 9.0   Liver Function Tests:  Recent Labs  02/18/13 0505  AST 22  ALT 14  ALKPHOS 63  BILITOT 1.7*  PROT 6.6  ALBUMIN 3.4*  No results found for this basename: LIPASE, AMYLASE,  in the last 8760 hours No results found for this basename: AMMONIA,  in the last 8760 hours CBC:  Recent Labs  02/17/13 1855 02/18/13 0505 02/20/13 0555  WBC 11.9* 9.9 12.2*  NEUTROABS 10.2*  --   --   HGB 12.7 12.1 10.1*  HCT 35.5* 34.8* 29.9*  MCV 83.7 84.5 87.2  PLT 100* 90* 79*   CBG:  Recent Labs  02/20/13 2249 02/21/13 0620 02/21/13 1121  GLUCAP 205* 159* 203*    Radiological Exams: reviewed  Assessment/Plan HYPERTENSION Continue norvasc 5 mg daily. Cozaar 50 mg daily and monitor bp  Fracture of elbow, medial condyle, right, closed S/p orif and has a sling. To follow with orthopedic. On cetracel with MVI  Closed fracture of single pubic ramus of pelvis S/p medical management and is WBAT. On lovenox and coumadin for dvt prophylaxis. Coumadin 3 mg daily at present and pending inr check for today. Goal inr 2-3.On hydrocodone-apap 5-325 1-2 tab q4h prn to help with pain and undergoing therapy sessions with PT/OT. Fall precautions  DM Continue amaryl and monitor cbg  GERD (gastroesophageal reflux disease) Continue prilosec and monitor symptoms  Unspecified constipation Has regular bowel regimen at present with senokot and miralax- monitor    Goals of care: to be able to return to her home environment   Labs/tests ordered Cbc, bmp, ortho follow up, pt/ot

## 2013-02-24 NOTE — Progress Notes (Signed)
This encounter was created in error - please disregard. This encounter was created in error - please disregard. This encounter was created in error - please disregard. 

## 2013-02-24 NOTE — Assessment & Plan Note (Signed)
Has regular bowel regimen at present with senokot and miralax- monitor

## 2013-02-24 NOTE — Assessment & Plan Note (Addendum)
Continue norvasc 5 mg daily. Cozaar 50 mg daily and monitor bp

## 2013-02-24 NOTE — Progress Notes (Signed)
Patient ID: Lauren Avery, female   DOB: 04/09/1927, 79 y.o.   MRN: [id number]  inr today 1.3 will increase coumadin to 4 mg daily for now with lovenox and check inr in 3 days and adjust coumadin dose accordingly

## 2013-02-25 ENCOUNTER — Encounter (HOSPITAL_COMMUNITY)
Admission: RE | Admit: 2013-02-25 | Discharge: 2013-02-25 | Disposition: A | Discharge status: Home / Self Care | Payer: Medicare Other | Source: Ambulatory Visit | Attending: Orthopaedic Surgery | Admitting: Orthopaedic Surgery

## 2013-02-25 DIAGNOSIS — Z96649 Presence of unspecified artificial hip joint: Secondary | ICD-10-CM | POA: Insufficient documentation

## 2013-02-25 DIAGNOSIS — M25551 Pain in right hip: Secondary | ICD-10-CM

## 2013-02-25 DIAGNOSIS — M25559 Pain in unspecified hip: Secondary | ICD-10-CM | POA: Insufficient documentation

## 2013-02-25 MED ORDER — TECHNETIUM TC 99M MEDRONATE IV KIT
25.0000 | PACK | Freq: Once | INTRAVENOUS | Status: AC | PRN
  Administered 2013-02-25: 25 via INTRAVENOUS

## 2013-03-01 ENCOUNTER — Non-Acute Institutional Stay (SKILLED_NURSING_FACILITY): Payer: Medicare Other | Admitting: Nurse Practitioner

## 2013-03-01 DIAGNOSIS — Z7901 Long term (current) use of anticoagulants: Secondary | ICD-10-CM

## 2013-03-01 DIAGNOSIS — I999 Unspecified disorder of circulatory system: Secondary | ICD-10-CM

## 2013-03-02 ENCOUNTER — Non-Acute Institutional Stay (SKILLED_NURSING_FACILITY): Payer: Medicare Other | Admitting: Nurse Practitioner

## 2013-03-02 ENCOUNTER — Encounter: Payer: Self-pay | Admitting: Nurse Practitioner

## 2013-03-02 DIAGNOSIS — Z7901 Long term (current) use of anticoagulants: Secondary | ICD-10-CM

## 2013-03-02 DIAGNOSIS — I999 Unspecified disorder of circulatory system: Secondary | ICD-10-CM

## 2013-03-02 MED ORDER — WARFARIN SODIUM 4 MG PO TABS: 4.0000 mg | ORAL_TABLET | Freq: Once | ORAL | Status: DC

## 2013-03-02 NOTE — Progress Notes (Signed)
Patient ID: Lauren Avery, female   DOB: [birth year], 79 y.o.   MRN: [id number] Subjective:     Indication: dvt prophylaxis Bleeding signs/symptoms: None Thromboembolic signs/symptoms: None  Missed Coumadin doses: None Medication changes: no Dietary changes: no Bacterial/viral infection: no Other concerns: no  The following portions of the patient's history were reviewed and updated as appropriate: allergies, current medications, past family history, past medical history, past social history, past surgical history and problem list.  Review of Systems A comprehensive review of systems was negative.   Objective:    INR Today: 2.3 Current dose: 4mg   Assessment:    Therapeutic INR for goal of 2-3   Plan:    1. New dose: no change, continue 4 mg po qd. 2. Next INR: 03/07/13

## 2013-03-02 NOTE — Progress Notes (Signed)
Patient ID: Lauren Avery, female   DOB: 1927-09-23, 79 y.o.   MRN: [id number] Subjective:     Indication: DVT prophylaxis, on Lovenox 40 mg sq qd and coumadin.  Bleeding signs/symptoms: None Thromboembolic signs/symptoms: None  Missed Coumadin doses: None Medication changes: no Dietary changes: no Bacterial/viral infection: no Other concerns: no  The following portions of the patient's history were reviewed and updated as appropriate: allergies, current medications, past family history, past medical history, past social history, past surgical history and problem list.  Review of Systems A comprehensive review of systems was negative.   Objective:    INR Today: 2.0 Current dose: 4mg   Assessment:    Therapeutic INR for goal of 2-3   Plan:    1. New dose: continue 4mg  po qd. D/C Lovenox for inr of 2.0.  2. Next INR: 03/02/13

## 2013-03-07 ENCOUNTER — Non-Acute Institutional Stay (SKILLED_NURSING_FACILITY): Payer: Medicare Other | Admitting: Nurse Practitioner

## 2013-03-07 DIAGNOSIS — Z7901 Long term (current) use of anticoagulants: Secondary | ICD-10-CM

## 2013-03-07 DIAGNOSIS — I999 Unspecified disorder of circulatory system: Secondary | ICD-10-CM

## 2013-03-07 NOTE — Progress Notes (Signed)
Patient ID: Lauren Avery, female   DOB: 04-Mar-1927, 79 y.o.   MRN: [id number] Subjective:      Indication: dvt prophylaxis, s/p fracture Bleeding signs/symptoms: None Thromboembolic signs/symptoms: None  Missed Coumadin doses: None Medication changes: no Dietary changes: no Bacterial/viral infection: no Other concerns: no  The following portions of the patient's history were reviewed and updated as appropriate: allergies, current medications, past family history, past medical history, past social history, past surgical history and problem list.  Review of Systems A comprehensive review of systems was negative.   Objective:    INR Today: 2.3 Current dose: 4mg   Assessment:    Therapeutic INR for goal of 2-3   Plan:    1. New dose: no change, continue 4 mg po qd. 2. Next INR: 03/14/13

## 2013-03-14 ENCOUNTER — Telehealth: Payer: Self-pay | Admitting: Pulmonary Disease

## 2013-03-14 ENCOUNTER — Non-Acute Institutional Stay (SKILLED_NURSING_FACILITY): Payer: Medicare Other | Admitting: Nurse Practitioner

## 2013-03-14 DIAGNOSIS — I999 Unspecified disorder of circulatory system: Secondary | ICD-10-CM

## 2013-03-14 DIAGNOSIS — Z7901 Long term (current) use of anticoagulants: Secondary | ICD-10-CM

## 2013-03-14 NOTE — Telephone Encounter (Signed)
I spoke with daughter. FYI for KC. Pt fell  On 02/17/13. Pt broke right elbow and pelvis. Not sure how long she will be in [place]. Daughter cancelled appt with [facility] on wed. She stated once pt gets out of [place] they will r/s appt. Pt is also not able to complete pulm rehab at this time d/t this as well. NO call back needed. Will forward to kc.

## 2013-03-16 ENCOUNTER — Ambulatory Visit: Payer: Medicare Other | Admitting: Pulmonary Disease

## 2013-03-21 ENCOUNTER — Non-Acute Institutional Stay (SKILLED_NURSING_FACILITY): Payer: Medicare Other | Admitting: Nurse Practitioner

## 2013-03-21 ENCOUNTER — Encounter: Payer: Self-pay | Admitting: Nurse Practitioner

## 2013-03-21 DIAGNOSIS — R05 Cough: Secondary | ICD-10-CM

## 2013-03-21 DIAGNOSIS — R059 Cough, unspecified: Secondary | ICD-10-CM

## 2013-03-21 DIAGNOSIS — Z7901 Long term (current) use of anticoagulants: Secondary | ICD-10-CM

## 2013-03-21 NOTE — Progress Notes (Signed)
  Subjective:    Patient ID: Lauren Avery, female    DOB: 03-21-1927, 79 y.o.   MRN: [id number]  HPI Comments: I was asked to see pt today for cough. Pt says that she was coughing and not feeling well since last Wednesday. Pt says she had a temp of 101.4 on Saturday. She was started on Mucinex for congestion, I am unsure if the on-call provider was made aware of pt's temp.  Cough The current episode started in the past 7 days. The problem has been gradually worsening. The problem occurs every few minutes. The cough is productive of sputum. Associated symptoms include a fever and wheezing. Treatments tried: Started on Mucinex and Guaifenesin. The treatment provided no relief. pmhx of pulmonary fibrosis      Review of Systems  Constitutional: Positive for fever.  HENT: Negative.   Eyes: Negative.   Respiratory: Positive for cough and wheezing.   Cardiovascular: Negative.   Gastrointestinal: Negative.   Endocrine: Negative.   Genitourinary: Negative.   Musculoskeletal: Negative.   Skin: Negative.   Allergic/Immunologic: Negative.   Neurological: Negative.   Hematological: Negative.   Psychiatric/Behavioral: Negative.        Objective:   Physical Exam  Constitutional: She is oriented to person, place, and time. She appears well-developed and well-nourished. No distress.  Eyes: Conjunctivae are normal. Pupils are equal, round, and reactive to light.  Cardiovascular: Normal rate and regular rhythm.   Pulmonary/Chest: Effort normal. She has wheezes. She has rales.  Neurological: She is alert and oriented to person, place, and time.  Skin: Skin is warm and dry. She is not diaphoretic.  Psychiatric: She has a normal mood and affect.          Assessment & Plan:  Pt with hx of pulmonary fibrosis non-productive cough x 1 week. Had fever this past Saturday, was started on Mucinex.   1) cxray secondary to cough and wheeze.  2) Duoneb one neb tx tid x 5 days - wheezing.  3)  Avelox 400mg  po qd x 7 days - URI  4) Anticoagulation - Surgery Date was 02/18/13. Have nurses call Dr. Lajoyce Corners to find out length of coumadin therapy, Has been on coumadin x one month today. Long term (current) use of anticoagulants Pt is taking coumadin for dvt prophylaxis.  Subjective:     Indication: dvt prophylaxis, s/p fracture. Bleeding signs/symptoms: None Thromboembolic signs/symptoms: None  Missed Coumadin doses: None Medication changes: no Dietary changes: no Bacterial/viral infection: URI, will start Avelox today. Other concerns: no  The following portions of the patient's history were reviewed and updated as appropriate: allergies, current medications, past family history, past medical history, past social history, past surgical history and problem list.  Review of Systems Pertinent items are noted in HPI.   Objective:    INR Today: 2.8 Current dose: 4mg   Assessment:    Therapeutic INR for goal of 2-3   Plan:    1. New dose: no change   2. Next INR: 1 week

## 2013-03-21 NOTE — Assessment & Plan Note (Signed)
Pt is taking coumadin for dvt prophylaxis.  Subjective:     Indication: dvt prophylaxis, s/p fracture. Bleeding signs/symptoms: None Thromboembolic signs/symptoms: None  Missed Coumadin doses: None Medication changes: no Dietary changes: no Bacterial/viral infection: URI, will start Avelox today. Other concerns: no  The following portions of the patient's history were reviewed and updated as appropriate: allergies, current medications, past family history, past medical history, past social history, past surgical history and problem list.  Review of Systems Pertinent items are noted in HPI.   Objective:    INR Today: 2.8 Current dose: 4mg   Assessment:    Therapeutic INR for goal of 2-3   Plan:    1. New dose: no change   2. Next INR: 1 week

## 2013-03-23 NOTE — Progress Notes (Signed)
Patient ID: Lauren Avery, female   DOB: Dec 04, 1926, 79 y.o.   MRN: [id number] Subjective:     Indication: dvt prophylaxis Bleeding signs/symptoms: None Thromboembolic signs/symptoms: None  Missed Coumadin doses: None Medication changes: no Dietary changes: no Bacterial/viral infection: no Other concerns: no  The following portions of the patient's history were reviewed and updated as appropriate: allergies, current medications, past family history, past medical history, past social history, past surgical history and problem list.  Review of Systems A comprehensive review of systems was negative.   Objective:    INR Today: 2.7 Current dose: 4mg   Assessment:    Therapeutic INR for goal of 2-3   Plan:    1. New dose: no change   2. Next INR: 1 week

## 2013-04-20 ENCOUNTER — Non-Acute Institutional Stay (SKILLED_NURSING_FACILITY): Payer: Medicare Other | Admitting: Nurse Practitioner

## 2013-04-20 DIAGNOSIS — M810 Age-related osteoporosis without current pathological fracture: Secondary | ICD-10-CM

## 2013-04-20 DIAGNOSIS — R5381 Other malaise: Secondary | ICD-10-CM

## 2013-04-20 NOTE — Progress Notes (Signed)
  Subjective:    Patient ID: Lauren Avery, female    DOB: 29-Aug-1927, 79 y.o.   MRN: [id number]  HPI Comments: Pt was seen today for discharge from STR for sacral insufficiency fracture. She has been stable. Today she is w/o complaint.     Review of Systems  All other systems reviewed and are negative.  The following portions of the patient's history were reviewed and updated as appropriate: allergies, current medications, past family history, past medical history, past social history, past surgical history and problem list.     Objective:   Physical Exam  Constitutional: She is oriented to person, place, and time. She appears well-developed and well-nourished. No distress.  Cardiovascular: Normal rate and regular rhythm.   Pulmonary/Chest: Effort normal and breath sounds normal.  Neurological: She is alert and oriented to person, place, and time.  Skin: Skin is warm and dry. She is not diaphoretic.  Psychiatric: She has a normal mood and affect.          Assessment & Plan:  1) rx's [facility] to [facility] on [address]. 2) Narcotic rx's in front of chart. 3) follow up with pcp in 1-2 weeks. 4) home health pt/ot

## 2013-07-25 MED ORDER — MULTI-VITAMIN/MINERALS PO TABS: 1.0000 | ORAL_TABLET | Freq: Every day | ORAL | Status: DC

## 2013-07-25 MED ORDER — POLYETHYLENE GLYCOL 3350 17 G PO PACK: 17.0000 g | PACK | Freq: Every day | ORAL | Status: DC | PRN

## 2013-07-25 MED ORDER — GLIMEPIRIDE 2 MG PO TABS: 2.0000 mg | ORAL_TABLET | Freq: Every day | ORAL | Status: DC

## 2013-07-25 MED ORDER — LOSARTAN POTASSIUM 50 MG PO TABS: 50.0000 mg | ORAL_TABLET | Freq: Every day | ORAL | Status: DC

## 2013-07-25 MED ORDER — SENNA 8.6 MG PO TABS: 1.0000 | ORAL_TABLET | Freq: Two times a day (BID) | ORAL | Status: DC | PRN

## 2013-07-25 MED ORDER — HYDROCODONE-ACETAMINOPHEN 5-325 MG PO TABS: 1.0000 | ORAL_TABLET | Freq: Three times a day (TID) | ORAL | Status: DC | PRN

## 2013-07-25 MED ORDER — ACETAMINOPHEN 325 MG PO TABS: 650.0000 mg | ORAL_TABLET | Freq: Four times a day (QID) | ORAL | Status: DC | PRN

## 2013-07-25 MED ORDER — CETIRIZINE HCL 10 MG PO TABS: 10.0000 mg | ORAL_TABLET | Freq: Every day | ORAL | Status: DC

## 2013-07-25 MED ORDER — AMLODIPINE BESYLATE 5 MG PO TABS: 5.0000 mg | ORAL_TABLET | Freq: Every day | ORAL | Status: DC

## 2013-07-25 MED ORDER — CALCIUM CITRATE-VITAMIN D 315-200 MG-UNIT PO TABS: 1.0000 | ORAL_TABLET | Freq: Two times a day (BID) | ORAL | Status: DC

## 2013-09-08 ENCOUNTER — Other Ambulatory Visit: Payer: Self-pay | Admitting: Nurse Practitioner

## 2014-01-27 ENCOUNTER — Encounter (HOSPITAL_COMMUNITY): Payer: Medicare Other

## 2014-02-01 ENCOUNTER — Encounter (HOSPITAL_COMMUNITY): Payer: Self-pay

## 2014-02-01 ENCOUNTER — Other Ambulatory Visit (HOSPITAL_COMMUNITY): Payer: Self-pay | Admitting: Internal Medicine

## 2014-02-01 ENCOUNTER — Ambulatory Visit (HOSPITAL_COMMUNITY)
Admission: RE | Admit: 2014-02-01 | Discharge: 2014-02-01 | Disposition: A | Discharge status: Home / Self Care | Payer: Medicare Other | Source: Ambulatory Visit | Attending: Internal Medicine | Admitting: Internal Medicine

## 2014-02-01 ENCOUNTER — Ambulatory Visit (HOSPITAL_COMMUNITY): Admission: RE | Admit: 2014-02-01 | Payer: Medicare Other | Source: Ambulatory Visit

## 2014-02-01 DIAGNOSIS — M810 Age-related osteoporosis without current pathological fracture: Secondary | ICD-10-CM | POA: Insufficient documentation

## 2014-02-01 MED ORDER — SODIUM CHLORIDE 0.9 % IV SOLN
Freq: Once | INTRAVENOUS | Status: AC
  Administered 2014-02-01: 12:00:00 via INTRAVENOUS

## 2014-02-01 MED ORDER — ZOLEDRONIC ACID 5 MG/100ML IV SOLN
5.0000 mg | Freq: Once | INTRAVENOUS | Status: AC
  Administered 2014-02-01: 5 mg via INTRAVENOUS
  Filled 2014-02-01: qty 100

## 2014-02-01 NOTE — Discharge Instructions (Signed)

## 2014-03-16 ENCOUNTER — Encounter: Payer: Self-pay | Admitting: Pulmonary Disease

## 2014-03-16 ENCOUNTER — Ambulatory Visit (INDEPENDENT_AMBULATORY_CARE_PROVIDER_SITE_OTHER): Payer: Medicare Other | Admitting: Pulmonary Disease

## 2014-03-16 ENCOUNTER — Other Ambulatory Visit (HOSPITAL_COMMUNITY): Payer: Self-pay | Admitting: Respiratory Therapy

## 2014-03-16 ENCOUNTER — Ambulatory Visit (INDEPENDENT_AMBULATORY_CARE_PROVIDER_SITE_OTHER)
Admission: RE | Admit: 2014-03-16 | Discharge: 2014-03-16 | Disposition: A | Discharge status: Home / Self Care | Payer: Medicare Other | Source: Ambulatory Visit | Attending: Pulmonary Disease | Admitting: Pulmonary Disease

## 2014-03-16 DIAGNOSIS — J84112 Idiopathic pulmonary fibrosis: Secondary | ICD-10-CM

## 2014-03-16 DIAGNOSIS — R05 Cough: Secondary | ICD-10-CM

## 2014-03-16 DIAGNOSIS — R059 Cough, unspecified: Secondary | ICD-10-CM

## 2014-03-16 DIAGNOSIS — R0602 Shortness of breath: Secondary | ICD-10-CM

## 2014-03-16 DIAGNOSIS — R053 Chronic cough: Secondary | ICD-10-CM

## 2014-03-16 MED ORDER — HYDROCODONE-HOMATROPINE 5-1.5 MG/5ML PO SYRP: 5.0000 mL | ORAL_SOLUTION | Freq: Every evening | ORAL | Status: DC | PRN

## 2014-03-16 MED ORDER — TRAMADOL HCL 50 MG PO TABS: 50.0000 mg | ORAL_TABLET | Freq: Four times a day (QID) | ORAL | Status: DC | PRN

## 2014-03-16 MED ORDER — OMEPRAZOLE 40 MG PO CPDR: 40.0000 mg | DELAYED_RELEASE_CAPSULE | Freq: Every day | ORAL | Status: DC

## 2014-03-16 NOTE — Assessment & Plan Note (Signed)
The pt continues to have dry cough that I suspect is both upper and lower airway in origin.  She is having ongoing reflux and in is no longer taking her PPI, and also is having rhinorrhea.  Chronic dry cough is also a hallmark of IPF.  I have also stressed to her the importance of reflux treatment in patients with interstitial lung disease, and how it can actually cause progressive disease .  It has become standard of care to treat all patients with IPF with a proton pump inhibitor empirically.  Will also try cough suppression both during the day and night.

## 2014-03-16 NOTE — Addendum Note (Signed)
Addended by: Maisie FusGREEN, Anetta Olvera M on: 03/16/2014 09:41 AM   Modules accepted: Orders

## 2014-03-16 NOTE — Assessment & Plan Note (Signed)
The patient has idiopathic pulmonary fibrosis, and she has not had recent chest x-ray or PFTs because of other medical issues that needed to be taken care of. She does feel that her breathing is a little worse now than in the past, and I will schedule her for a followup chest x-ray and pulmonary function studies. I do not think she is a great candidate for anti-fibrotic therapy.

## 2014-03-16 NOTE — Progress Notes (Signed)
   Subjective:    Patient ID: Lauren Avery, female    DOB: [birth year], 80 y.o.   MRN: [id number]  HPI The patient comes in today for followup of her known idiopathic pulmonary fibrosis and chronic cough that is felt secondary to upper and lower airway issues. The patient was last seen 2 years ago, and she never followed up with her chest x-ray and pulmonary function studies because of other medical issues that required hospitalizations. She comes in today where her cough continues to be a significant problem, and she feels that her exertional tolerance has declined as well. She is no longer taking her proton pump inhibitor, and is having ongoing reflux symptoms. She is also having a lot of rhinorrhea, and is not taking her antihistamine on a regular basis.   Review of Systems  Constitutional: Negative for fever and unexpected weight change.  HENT: Negative for congestion, dental problem, ear pain, nosebleeds, postnasal drip, rhinorrhea, sinus pressure, sneezing, sore throat and trouble swallowing.   Eyes: Negative for redness and itching.  Respiratory: Positive for cough and shortness of breath. Negative for chest tightness and wheezing.   Cardiovascular: Negative for palpitations and leg swelling.  Gastrointestinal: Negative for nausea and vomiting.  Genitourinary: Negative for dysuria.  Musculoskeletal: Negative for joint swelling.  Skin: Negative for rash.  Neurological: Negative for headaches.  Hematological: Does not bruise/bleed easily.  Psychiatric/Behavioral: Negative for dysphoric mood. The patient is not nervous/anxious.        Objective:   Physical Exam Frail-appearing female in no acute distress Nose without purulence or discharge noted Neck without lymphadenopathy or thyromegaly Chest with crackles one half the way up bilaterally, no wheezing Cardiac exam with regular rate and rhythm Lower extremities with mild ankle edema, no cyanosis Alert and oriented, moves all  4 extremities.       Assessment & Plan:

## 2014-03-16 NOTE — Patient Instructions (Signed)
Try taking your zyrtec 10mg  everynight at bedtime until allergy season is over Will restart omeprazole 40mg , one each am before breakfast Try tramadol 50mg  one every 6hrs as needed for cough during the day. Hycodan cough syrup one teaspoon at bedtime if having a lot of coughing.  Try to avoid during the day and take tramadol instead unless having a very hard time. Will check chest xray today and call you with results. Will schedule for breathing tests, and will call you with results and see how cough is doing. Can schedule followup with me after that.

## 2014-03-20 ENCOUNTER — Ambulatory Visit (HOSPITAL_COMMUNITY)
Admission: RE | Admit: 2014-03-20 | Discharge: 2014-03-20 | Disposition: A | Discharge status: Home / Self Care | Payer: Medicare Other | Source: Ambulatory Visit | Attending: Pulmonary Disease | Admitting: Pulmonary Disease

## 2014-03-20 DIAGNOSIS — R062 Wheezing: Secondary | ICD-10-CM | POA: Insufficient documentation

## 2014-03-20 DIAGNOSIS — R05 Cough: Secondary | ICD-10-CM | POA: Insufficient documentation

## 2014-03-20 DIAGNOSIS — R0609 Other forms of dyspnea: Secondary | ICD-10-CM | POA: Insufficient documentation

## 2014-03-20 DIAGNOSIS — R0602 Shortness of breath: Secondary | ICD-10-CM | POA: Insufficient documentation

## 2014-03-20 DIAGNOSIS — R059 Cough, unspecified: Secondary | ICD-10-CM | POA: Insufficient documentation

## 2014-03-20 DIAGNOSIS — R0989 Other specified symptoms and signs involving the circulatory and respiratory systems: Secondary | ICD-10-CM | POA: Insufficient documentation

## 2014-03-20 MED ORDER — ALBUTEROL SULFATE (2.5 MG/3ML) 0.083% IN NEBU
2.5000 mg | INHALATION_SOLUTION | Freq: Once | RESPIRATORY_TRACT | Status: AC
  Administered 2014-03-20: 2.5 mg via RESPIRATORY_TRACT

## 2014-03-23 LAB — PULMONARY FUNCTION TEST
DL/VA % pred: 71 %
DL/VA: 3.25 ml/min/mmHg/L
DLCO unc % pred: 43 %
DLCO unc: 9.36 ml/min/mmHg
FEF 25-75 Post: 1.98 L/sec
FEF 25-75 Pre: 1.89 L/sec
FEF2575-%Change-Post: 4 %
FEF2575-%Pred-Post: 192 %
FEF2575-%Pred-Pre: 183 %
FEV1-%Change-Post: 4 %
FEV1-%Pred-Post: 106 %
FEV1-%Pred-Pre: 101 %
FEV1-Post: 1.67 L
FEV1-Pre: 1.59 L
FEV1FVC-%Change-Post: 2 %
FEV1FVC-%Pred-Pre: 115 %
FEV6-%Change-Post: 2 %
FEV6-%Pred-Post: 96 %
FEV6-%Pred-Pre: 94 %
FEV6-Post: 1.92 L
FEV6-Pre: 1.88 L
FEV6FVC-%Pred-Post: 107 %
FEV6FVC-%Pred-Pre: 107 %
FVC-%Change-Post: 2 %
FVC-%Pred-Post: 89 %
FVC-%Pred-Pre: 88 %
FVC-Post: 1.92 L
FVC-Pre: 1.88 L
Post FEV1/FVC ratio: 87 %
Post FEV6/FVC ratio: 100 %
Pre FEV1/FVC ratio: 85 %
Pre FEV6/FVC Ratio: 100 %
RV % pred: 61 %
RV: 1.47 L
TLC % pred: 69 %
TLC: 3.31 L

## 2014-03-27 ENCOUNTER — Ambulatory Visit (INDEPENDENT_AMBULATORY_CARE_PROVIDER_SITE_OTHER): Payer: Medicare Other | Admitting: Pulmonary Disease

## 2014-03-27 ENCOUNTER — Telehealth: Payer: Self-pay | Admitting: Pulmonary Disease

## 2014-03-27 ENCOUNTER — Encounter: Payer: Self-pay | Admitting: Pulmonary Disease

## 2014-03-27 DIAGNOSIS — J84112 Idiopathic pulmonary fibrosis: Secondary | ICD-10-CM

## 2014-03-27 MED ORDER — PANTOPRAZOLE SODIUM 40 MG PO TBEC: 40.0000 mg | DELAYED_RELEASE_TABLET | Freq: Two times a day (BID) | ORAL | Status: DC

## 2014-03-27 NOTE — Telephone Encounter (Signed)
Pt aware of recs.  Nothing further needed. 

## 2014-03-27 NOTE — Assessment & Plan Note (Addendum)
The patient's most recent chest x-ray showed some disease progression, and although her PFTs do show a drop in her .FVC and TLC, her DLCO is actually slightly improved. Again, I do not think she is a very good candidate for the anti-fibrotic agents, especially since they are associated with severe diarrhea. I worry about her frailty going forward. At this point, I think we will continue to watch her, and work on symptom control. I think it is very important that she stays on a proton pump inhibitor, since reflux is extremely common in pulmonary fibrosis patients.  Not only does it worsen cough, but it also can cause progressive disease. She did not do well on Prilosec, so we'll try her on Protonix. I have also asked her to continue on something for postnasal drip As well. She tells me that tramadol did help her cough, and I have asked her to continue on this as needed.  Of note, her ambulatory oximetry today on room air did not show desaturation of significance.

## 2014-03-27 NOTE — Telephone Encounter (Signed)
Spoke with the pt  She states that her insurance will not cover the protonix bid  They will cover # 30 and she went ahead and got this filled  I advised that Lauren Avery wanted her on this med twice daily  She wants to know if once per day will be okay since this is what ins allows  Please advise thanks!

## 2014-03-27 NOTE — Progress Notes (Signed)
   Subjective:    Patient ID: Lauren Avery, female    DOB: May 11, 1927, 80 y.o.   MRN: [id number]  HPI The patient comes in today for followup after her recent chest x-ray and PFTs. She has known idiopathic pulmonary fibrosis, and her chest x-ray showed progressive disease. Her PFTs compared to 2013 showed a decline in her FVC and T. LC, but her diffusion capacity was actually a little better. The patient continues to have her dry cough, but has recently started to have more of a wet cough that is productive of only white mucus. She does not feel chest congestion. She was having loose stools from her proton pump inhibitor, and this was discontinued. She now has breakthrough reflux symptoms for which she is taking times. She does feel that tramadol helps her dry cough.   Review of Systems  Constitutional: Negative for fever and unexpected weight change.  HENT: Positive for congestion. Negative for dental problem, ear pain, nosebleeds, postnasal drip, rhinorrhea, sinus pressure, sneezing, sore throat and trouble swallowing.   Eyes: Negative for redness and itching.  Respiratory: Positive for cough and shortness of breath. Negative for chest tightness and wheezing.   Cardiovascular: Negative for palpitations and leg swelling.  Gastrointestinal: Negative for nausea and vomiting.  Genitourinary: Negative for dysuria.  Musculoskeletal: Negative for joint swelling.  Skin: Negative for rash.  Neurological: Negative for headaches.  Hematological: Does not bruise/bleed easily.  Psychiatric/Behavioral: Negative for dysphoric mood. The patient is not nervous/anxious.        Objective:   Physical Exam Frail-appearing female in no acute distress Nose without purulence or discharge noted Neck without lymphadenopathy or thyromegaly Chest with crackles two thirds of the way up bilaterally, no wheezing Cardiac exam with regular rate and rhythm Lower extremities with minimal edema, no  cyanosis Alert and oriented, moves all 4 extremities.       Assessment & Plan:

## 2014-03-27 NOTE — Patient Instructions (Addendum)
Will need to get you back on medication for your reflux symptoms.  This can also significantly affect your lung disease.  Would take protonix 40mg  one in am and pm. Try mucinex dm one in am and pm with large glass of water for next few weeks.  If cough no better, let me know. Try and stay as active as possible.   Can take tramadol as needed for dry cough.  followup with me again in 4mos.

## 2014-03-27 NOTE — Telephone Encounter (Signed)
Have her get pepcid 40mg  otc and take one at bedtime as well.

## 2014-03-30 ENCOUNTER — Telehealth: Payer: Self-pay | Admitting: Pulmonary Disease

## 2014-03-30 NOTE — Telephone Encounter (Signed)
If all they will pay for is once a day, then do not have a lot of options.  See if there is something else they would consider bid (?dexilant, zegerid?)

## 2014-03-30 NOTE — Telephone Encounter (Signed)
LMTCB

## 2014-03-30 NOTE — Telephone Encounter (Signed)
Per OV 03/26/14: Will need to get you back on medication for your reflux symptoms.  This can also significantly affect your lung disease.  Would take protonix 40mg  one in am and pm. Try mucinex dm one in am and pm with large glass of water for next few weeks.  If cough no better, let me know. Try and stay as active as possible.    Can take tramadol as needed for dry cough.   followup with me again in 4mos.  ----  Spoke with pt. She reports her insurance will only pay for the protonix to take QD and not BID. [facility]. Please advise KC thanks

## 2014-03-31 NOTE — Telephone Encounter (Signed)
Called, spoke with pt.  Pt was unsure of the below. Called [facility] was advised the protonix will need a limitation override and could call her insurance at [phone].  ID # is [id number]. [employer]ed insurance co, spoke with Dashia.  Was advised pt's plan only covers 30/30 for all PPIs this includes dexilant. Zegerid isn't on formulary.  Was advised quantity limitation would need to be completed if pt needs more than 30/30.  This has been initiated with Dx of GERD and IPF.  Pt has tried omeprazole 40 mg qd.  Per Dashia, we will receive notification of approval/denial within 72 hrs.  Pt aware.  Will route msg to Ashtyn's box to await approval/denial form.

## 2014-04-05 MED ORDER — PANTOPRAZOLE SODIUM 40 MG PO TBEC: 40.0000 mg | DELAYED_RELEASE_TABLET | Freq: Two times a day (BID) | ORAL | Status: DC

## 2014-04-05 NOTE — Telephone Encounter (Signed)
Per insurance company, Protonix has been approved 03/31/14-04/01/15 for the qty limitation override. Protonix 40mg  1 po BID #[id number] sent to [facility]. Pt aware this approved and will be able to pick up today.

## 2014-07-26 ENCOUNTER — Ambulatory Visit: Payer: Medicare Other | Admitting: Pulmonary Disease

## 2015-02-15 ENCOUNTER — Ambulatory Visit (HOSPITAL_COMMUNITY): Payer: Medicare Other

## 2015-04-12 ENCOUNTER — Other Ambulatory Visit: Payer: Self-pay | Admitting: Orthopaedic Surgery

## 2015-04-12 DIAGNOSIS — M25522 Pain in left elbow: Secondary | ICD-10-CM

## 2015-04-25 ENCOUNTER — Ambulatory Visit
Admission: RE | Admit: 2015-04-25 | Discharge: 2015-04-25 | Disposition: A | Discharge status: Home / Self Care | Payer: Medicare Other | Source: Ambulatory Visit | Attending: Orthopaedic Surgery | Admitting: Orthopaedic Surgery

## 2015-04-25 DIAGNOSIS — M25522 Pain in left elbow: Secondary | ICD-10-CM

## 2015-05-04 ENCOUNTER — Other Ambulatory Visit (HOSPITAL_COMMUNITY): Payer: Self-pay | Admitting: Orthopaedic Surgery

## 2015-05-08 MED ORDER — CEFAZOLIN SODIUM-DEXTROSE 2-3 GM-% IV SOLR
2.0000 g | INTRAVENOUS | Status: AC
  Administered 2015-05-09: 2 g via INTRAVENOUS

## 2015-05-08 NOTE — H&P (Signed)
PREOPERATIVE H&P  Chief Complaint: left medial humeral condyle fracture  HPI: Lauren Avery is a 81 y.o. female who presents for surgical treatment of left medial humeral condyle fracture.  She denies any changes in medical history.  Past Medical History  Diagnosis Date  . Osteoporosis   . Pulmonary fibrosis   . GERD (gastroesophageal reflux disease)   . Fracture of left clavicle     12/12 wore a sling  . Hypertension   . Diabetes mellitus   . Fracture 10/2012    left wrist   Past Surgical History  Procedure Laterality Date  . Hip fracture surgery      1984 pin and plate placed right  . Hip surgery      pin and plate removed 12/6107  . Femur fracture surgery       6/08 partial hip replacement right  . Ganglion cyst removal      left wrist 1960  . Orif elbow fracture Right 02/18/2013    Procedure: OPEN REDUCTION INTERNAL FIXATION (ORIF) ELBOW/OLECRANON FRACTURE;  Surgeon: Nadara Mustard, MD;  Location: [facility] OR;  Service: Orthopedics;  Laterality: Right;   History   Social History  . Marital Status: Widowed    Spouse Name: N/A  . Number of Children: Y  . Years of Education: N/A   Occupational History  . retired Airline pilot    Social History Main Topics  . Smoking status: Never Smoker   . Smokeless tobacco: Not on file  . Alcohol Use: No  . Drug Use: No  . Sexual Activity: Not on file   Other Topics Concern  . Not on file   Social History Narrative  . No narrative on file   Family History  Problem Relation Age of Onset  . Pancreatic cancer Brother   . Stroke Mother   . Diabetes Father    Allergies  Allergen Reactions  . Hydrochlorothiazide Other (See Comments)    Taken from faxed notes of dr Wylene Simmer (to short stay)  THROMBOCYTOPENIA  . Prilosec [Omeprazole] Diarrhea   Prior to Admission medications   Medication Sig Start Date End Date Taking? Authorizing Provider  acetaminophen (TYLENOL) 325 MG tablet Take 2 tablets (650 mg total) by mouth  every 6 (six) hours as needed. Patient taking differently: Take 650 mg by mouth every 6 (six) hours as needed for mild pain, fever or headache.  04/20/13  Yes Lucrezia Starch, NP  amLODipine (NORVASC) 5 MG tablet Take 5 mg by mouth every other day.  04/20/13  Yes Lucrezia Starch, NP  calcium carbonate (TUMS - DOSED IN MG ELEMENTAL CALCIUM) 500 MG chewable tablet Chew 1 tablet by mouth 2 (two) times daily with a meal.   Yes Historical Provider, MD  calcium citrate-vitamin D (CITRACAL+D) 315-200 MG-UNIT per tablet Take 1 tablet by mouth 2 (two) times daily. Patient taking differently: Take 1 tablet by mouth daily.  04/20/13  Yes Lucrezia Starch, NP  glimepiride (AMARYL) 2 MG tablet Take 1 tablet (2 mg total) by mouth daily before breakfast. 04/20/13  Yes Lucrezia Starch, NP  HYDROcodone-homatropine (HYCODAN) 5-1.5 MG/5ML syrup Take 5 mLs by mouth at bedtime as needed for cough. 03/16/14  Yes Barbaraann Share, MD  ibuprofen (ADVIL,MOTRIN) 200 MG tablet Take 200 mg by mouth every 6 (six) hours as needed for fever, headache or mild pain.   Yes Historical Provider, MD  losartan (COZAAR) 50 MG tablet Take 50 mg by mouth daily.   Yes Historical Provider, MD  Zoledronic Acid (RECLAST IV) Inject into the vein. yearly   Yes Historical Provider, MD  cetirizine (ZYRTEC) 10 MG tablet Take 1 tablet (10 mg total) by mouth daily. Patient not taking: Reported on 05/08/2015 04/20/13   Lucrezia StarchEdwina Berry, NP  Multiple Vitamins-Minerals (MULTIVITAMIN WITH MINERALS) tablet Take 1 tablet by mouth daily. Patient not taking: Reported on 05/08/2015 04/20/13   Lucrezia StarchEdwina Berry, NP  pantoprazole (PROTONIX) 40 MG tablet Take 1 tablet (40 mg total) by mouth 2 (two) times daily. Patient not taking: Reported on 05/08/2015 04/05/14   Barbaraann ShareKeith M Clance, MD  traMADol (ULTRAM) 50 MG tablet Take 1 tablet (50 mg total) by mouth every 6 (six) hours as needed (cough). Patient not taking: Reported on 05/08/2015 03/16/14   Barbaraann ShareKeith M Clance, MD     Positive ROS: All other systems  have been reviewed and were otherwise negative with the exception of those mentioned in the HPI and as above.  Physical Exam: General: Alert, no acute distress Cardiovascular: No pedal edema Respiratory: No cyanosis, no use of accessory musculature GI: abdomen soft Skin: No lesions in the area of chief complaint Neurologic: Sensation intact distally Psychiatric: Patient is competent for consent with normal mood and affect Lymphatic: no lymphedema  MUSCULOSKELETAL: exam stable  Assessment: left medial humeral condyle fracture  Plan: Plan for Procedure(s): OPEN REDUCTION INTERNAL FIXATION (ORIF) LEFT MEDIAL HUMERAL CONDYLE FRACTURE  The risks benefits and alternatives were discussed with the patient including but not limited to the risks of nonoperative treatment, versus surgical intervention including infection, bleeding, nerve injury,  blood clots, cardiopulmonary complications, morbidity, mortality, among others, and they were willing to proceed.   Cheral AlmasXu, Ciria Bernardini Michael, MD   05/08/2015 8:24 PM

## 2015-05-09 ENCOUNTER — Encounter (HOSPITAL_COMMUNITY)
Admission: AD | Disposition: A | Discharge status: Skilled Nursing Facility | Payer: Self-pay | Source: Ambulatory Visit | Attending: Orthopaedic Surgery

## 2015-05-09 ENCOUNTER — Encounter (HOSPITAL_COMMUNITY): Payer: Self-pay | Admitting: *Deleted

## 2015-05-09 ENCOUNTER — Ambulatory Visit (HOSPITAL_COMMUNITY): Payer: Medicare Other | Admitting: Certified Registered Nurse Anesthetist

## 2015-05-09 ENCOUNTER — Other Ambulatory Visit: Payer: Self-pay

## 2015-05-09 ENCOUNTER — Ambulatory Visit (HOSPITAL_COMMUNITY): Payer: Medicare Other

## 2015-05-09 ENCOUNTER — Inpatient Hospital Stay (HOSPITAL_COMMUNITY)
Admission: AD | Admit: 2015-05-09 | Discharge: 2015-05-11 | DRG: 493 | Disposition: A | Discharge status: Skilled Nursing Facility | Payer: Medicare Other | Source: Ambulatory Visit | Attending: Orthopaedic Surgery | Admitting: Orthopaedic Surgery

## 2015-05-09 DIAGNOSIS — M80022A Age-related osteoporosis with current pathological fracture, left humerus, initial encounter for fracture: Principal | ICD-10-CM | POA: Diagnosis present

## 2015-05-09 DIAGNOSIS — Z419 Encounter for procedure for purposes other than remedying health state, unspecified: Secondary | ICD-10-CM

## 2015-05-09 DIAGNOSIS — S42462A Displaced fracture of medial condyle of left humerus, initial encounter for closed fracture: Secondary | ICD-10-CM

## 2015-05-09 DIAGNOSIS — J8410 Pulmonary fibrosis, unspecified: Secondary | ICD-10-CM | POA: Diagnosis present

## 2015-05-09 DIAGNOSIS — E119 Type 2 diabetes mellitus without complications: Secondary | ICD-10-CM | POA: Diagnosis present

## 2015-05-09 DIAGNOSIS — Z823 Family history of stroke: Secondary | ICD-10-CM

## 2015-05-09 DIAGNOSIS — Z833 Family history of diabetes mellitus: Secondary | ICD-10-CM | POA: Diagnosis not present

## 2015-05-09 DIAGNOSIS — K219 Gastro-esophageal reflux disease without esophagitis: Secondary | ICD-10-CM | POA: Diagnosis present

## 2015-05-09 DIAGNOSIS — Z7982 Long term (current) use of aspirin: Secondary | ICD-10-CM

## 2015-05-09 DIAGNOSIS — S42463A Displaced fracture of medial condyle of unspecified humerus, initial encounter for closed fracture: Secondary | ICD-10-CM | POA: Diagnosis present

## 2015-05-09 DIAGNOSIS — T8489XA Other specified complication of internal orthopedic prosthetic devices, implants and grafts, initial encounter: Secondary | ICD-10-CM | POA: Diagnosis present

## 2015-05-09 DIAGNOSIS — Z79899 Other long term (current) drug therapy: Secondary | ICD-10-CM

## 2015-05-09 DIAGNOSIS — D62 Acute posthemorrhagic anemia: Secondary | ICD-10-CM | POA: Diagnosis not present

## 2015-05-09 DIAGNOSIS — Z96641 Presence of right artificial hip joint: Secondary | ICD-10-CM | POA: Diagnosis present

## 2015-05-09 DIAGNOSIS — I10 Essential (primary) hypertension: Secondary | ICD-10-CM | POA: Diagnosis present

## 2015-05-09 DIAGNOSIS — M25522 Pain in left elbow: Secondary | ICD-10-CM | POA: Diagnosis present

## 2015-05-09 HISTORY — PX: ORIF ELBOW FRACTURE: SHX5031

## 2015-05-09 LAB — BASIC METABOLIC PANEL
Anion gap: 13 (ref 5–15)
BUN: 21 mg/dL — ABNORMAL HIGH (ref 6–20)
CO2: 19 mmol/L — ABNORMAL LOW (ref 22–32)
Calcium: 9.8 mg/dL (ref 8.9–10.3)
Chloride: 106 mmol/L (ref 101–111)
Creatinine, Ser: 1.12 mg/dL — ABNORMAL HIGH (ref 0.44–1.00)
GFR calc Af Amer: 50 mL/min — ABNORMAL LOW (ref >60)
GFR calc non Af Amer: 43 mL/min — ABNORMAL LOW (ref >60)
Glucose, Bld: 143 mg/dL — ABNORMAL HIGH (ref 65–99)
Potassium: 4.9 mmol/L (ref 3.5–5.1)
Sodium: 138 mmol/L (ref 135–145)

## 2015-05-09 LAB — CBC
HCT: 36.7 % (ref 36.0–46.0)
Hemoglobin: 12.4 g/dL (ref 12.0–15.0)
MCH: 29.6 pg (ref 26.0–34.0)
MCHC: 33.8 g/dL (ref 30.0–36.0)
MCV: 87.6 fL (ref 78.0–100.0)
Platelets: 114 10*3/uL — ABNORMAL LOW (ref 150–400)
RBC: 4.19 MIL/uL (ref 3.87–5.11)
RDW: 13.8 % (ref 11.5–15.5)
WBC: 8.3 10*3/uL (ref 4.0–10.5)

## 2015-05-09 LAB — GLUCOSE, CAPILLARY
Glucose-Capillary: 150 mg/dL — ABNORMAL HIGH (ref 65–99)
Glucose-Capillary: 121 mg/dL — ABNORMAL HIGH (ref 65–99)
Glucose-Capillary: 194 mg/dL — ABNORMAL HIGH (ref 65–99)

## 2015-05-09 SURGERY — OPEN REDUCTION INTERNAL FIXATION (ORIF) ELBOW/OLECRANON FRACTURE
Anesthesia: Regional | Site: Elbow | Laterality: Left

## 2015-05-09 MED ORDER — TRAMADOL HCL 50 MG PO TABS: 50.0000 mg | ORAL_TABLET | Freq: Four times a day (QID) | ORAL | Status: DC | PRN

## 2015-05-09 MED ORDER — PANTOPRAZOLE SODIUM 40 MG PO TBEC
40.0000 mg | DELAYED_RELEASE_TABLET | Freq: Two times a day (BID) | ORAL | Status: DC
  Administered 2015-05-09 – 2015-05-11 (×4): 40 mg via ORAL
  Filled 2015-05-09 (×4): qty 1

## 2015-05-09 MED ORDER — ONDANSETRON HCL 4 MG/2ML IJ SOLN: 4.0000 mg | Freq: Four times a day (QID) | INTRAMUSCULAR | Status: DC | PRN

## 2015-05-09 MED ORDER — PROPOFOL 10 MG/ML IV BOLUS
INTRAVENOUS | Status: AC
  Filled 2015-05-09: qty 20

## 2015-05-09 MED ORDER — PHENYLEPHRINE HCL 10 MG/ML IJ SOLN
INTRAMUSCULAR | Status: AC
  Filled 2015-05-09: qty 1

## 2015-05-09 MED ORDER — PHENYLEPHRINE HCL 10 MG/ML IJ SOLN
INTRAMUSCULAR | Status: DC | PRN
  Administered 2015-05-09 (×5): 80 ug via INTRAVENOUS

## 2015-05-09 MED ORDER — DIPHENHYDRAMINE HCL 12.5 MG/5ML PO ELIX: 25.0000 mg | ORAL_SOLUTION | ORAL | Status: DC | PRN

## 2015-05-09 MED ORDER — FENTANYL CITRATE (PF) 100 MCG/2ML IJ SOLN
100.0000 ug | Freq: Once | INTRAMUSCULAR | Status: AC
  Administered 2015-05-09: 50 ug via INTRAVENOUS
  Filled 2015-05-09: qty 2

## 2015-05-09 MED ORDER — LOSARTAN POTASSIUM 50 MG PO TABS
50.0000 mg | ORAL_TABLET | Freq: Every day | ORAL | Status: DC
  Administered 2015-05-09 – 2015-05-11 (×3): 50 mg via ORAL
  Filled 2015-05-09 (×3): qty 1

## 2015-05-09 MED ORDER — MORPHINE SULFATE 2 MG/ML IJ SOLN: 1.0000 mg | INTRAMUSCULAR | Status: DC | PRN

## 2015-05-09 MED ORDER — ONDANSETRON HCL 4 MG/2ML IJ SOLN
INTRAMUSCULAR | Status: DC | PRN
  Administered 2015-05-09: 4 mg via INTRAVENOUS

## 2015-05-09 MED ORDER — SENNOSIDES-DOCUSATE SODIUM 8.6-50 MG PO TABS: 1.0000 | ORAL_TABLET | Freq: Every evening | ORAL | Status: DC | PRN

## 2015-05-09 MED ORDER — HYDROCODONE-ACETAMINOPHEN 7.5-325 MG PO TABS: 1.0000 | ORAL_TABLET | Freq: Four times a day (QID) | ORAL | Status: DC | PRN

## 2015-05-09 MED ORDER — AMLODIPINE BESYLATE 5 MG PO TABS
5.0000 mg | ORAL_TABLET | ORAL | Status: DC
  Administered 2015-05-11: 5 mg via ORAL
  Filled 2015-05-09 (×2): qty 1

## 2015-05-09 MED ORDER — ASPIRIN EC 325 MG PO TBEC: 325.0000 mg | DELAYED_RELEASE_TABLET | Freq: Every day | ORAL | Status: DC

## 2015-05-09 MED ORDER — LORATADINE 10 MG PO TABS
10.0000 mg | ORAL_TABLET | Freq: Every day | ORAL | Status: DC
  Administered 2015-05-09 – 2015-05-11 (×2): 10 mg via ORAL
  Filled 2015-05-09 (×3): qty 1

## 2015-05-09 MED ORDER — GLIMEPIRIDE 4 MG PO TABS
2.0000 mg | ORAL_TABLET | Freq: Every day | ORAL | Status: DC
  Administered 2015-05-10 – 2015-05-11 (×2): 2 mg via ORAL
  Filled 2015-05-09 (×2): qty 1

## 2015-05-09 MED ORDER — SODIUM CHLORIDE 0.9 % IV SOLN
10000.0000 ug | INTRAVENOUS | Status: DC | PRN
  Administered 2015-05-09 (×2): 80 ug/min via INTRAVENOUS

## 2015-05-09 MED ORDER — PHENYLEPHRINE 40 MCG/ML (10ML) SYRINGE FOR IV PUSH (FOR BLOOD PRESSURE SUPPORT)
PREFILLED_SYRINGE | INTRAVENOUS | Status: AC
  Filled 2015-05-09: qty 10

## 2015-05-09 MED ORDER — FENTANYL CITRATE (PF) 250 MCG/5ML IJ SOLN
INTRAMUSCULAR | Status: AC
  Filled 2015-05-09: qty 5

## 2015-05-09 MED ORDER — PROPOFOL 10 MG/ML IV BOLUS
INTRAVENOUS | Status: DC | PRN
  Administered 2015-05-09: 120 mg via INTRAVENOUS

## 2015-05-09 MED ORDER — METOCLOPRAMIDE HCL 5 MG/ML IJ SOLN: 5.0000 mg | Freq: Three times a day (TID) | INTRAMUSCULAR | Status: DC | PRN

## 2015-05-09 MED ORDER — ASPIRIN EC 325 MG PO TBEC
325.0000 mg | DELAYED_RELEASE_TABLET | Freq: Every day | ORAL | Status: DC
  Administered 2015-05-09 – 2015-05-11 (×3): 325 mg via ORAL
  Filled 2015-05-09 (×3): qty 1

## 2015-05-09 MED ORDER — MIDAZOLAM HCL 2 MG/2ML IJ SOLN
INTRAMUSCULAR | Status: AC
  Filled 2015-05-09: qty 2

## 2015-05-09 MED ORDER — HYDROCODONE-HOMATROPINE 5-1.5 MG/5ML PO SYRP
5.0000 mL | ORAL_SOLUTION | Freq: Every evening | ORAL | Status: DC | PRN
  Administered 2015-05-10: 5 mL via ORAL
  Filled 2015-05-09: qty 5

## 2015-05-09 MED ORDER — CALCIUM CARBONATE ANTACID 500 MG PO CHEW
1.0000 | CHEWABLE_TABLET | Freq: Two times a day (BID) | ORAL | Status: DC
  Administered 2015-05-10 – 2015-05-11 (×3): 200 mg via ORAL
  Filled 2015-05-09 (×3): qty 1

## 2015-05-09 MED ORDER — ACETAMINOPHEN 650 MG RE SUPP: 650.0000 mg | Freq: Four times a day (QID) | RECTAL | Status: DC | PRN

## 2015-05-09 MED ORDER — ACETAMINOPHEN 325 MG PO TABS: 650.0000 mg | ORAL_TABLET | Freq: Four times a day (QID) | ORAL | Status: DC | PRN

## 2015-05-09 MED ORDER — ONDANSETRON HCL 4 MG PO TABS
4.0000 mg | ORAL_TABLET | Freq: Four times a day (QID) | ORAL | Status: DC | PRN
  Administered 2015-05-10: 4 mg via ORAL
  Filled 2015-05-09: qty 1

## 2015-05-09 MED ORDER — OXYCODONE HCL 5 MG/5ML PO SOLN: 5.0000 mg | Freq: Once | ORAL | Status: DC | PRN

## 2015-05-09 MED ORDER — CEFAZOLIN SODIUM-DEXTROSE 2-3 GM-% IV SOLR
2.0000 g | Freq: Four times a day (QID) | INTRAVENOUS | Status: AC
  Administered 2015-05-09 – 2015-05-10 (×3): 2 g via INTRAVENOUS
  Filled 2015-05-09 (×3): qty 50

## 2015-05-09 MED ORDER — FENTANYL CITRATE (PF) 100 MCG/2ML IJ SOLN: 25.0000 ug | INTRAMUSCULAR | Status: DC | PRN

## 2015-05-09 MED ORDER — SODIUM CHLORIDE 0.9 % IV SOLN
INTRAVENOUS | Status: DC
  Administered 2015-05-09: 20:00:00 via INTRAVENOUS

## 2015-05-09 MED ORDER — LACTATED RINGERS IV SOLN
INTRAVENOUS | Status: DC
  Administered 2015-05-09 (×2): via INTRAVENOUS

## 2015-05-09 MED ORDER — OXYCODONE HCL 5 MG PO TABS: 5.0000 mg | ORAL_TABLET | Freq: Once | ORAL | Status: DC | PRN

## 2015-05-09 MED ORDER — HYDROCODONE-ACETAMINOPHEN 7.5-325 MG PO TABS
1.0000 | ORAL_TABLET | ORAL | Status: DC | PRN
  Administered 2015-05-10 (×4): 1 via ORAL
  Filled 2015-05-09 (×4): qty 1

## 2015-05-09 MED ORDER — LIDOCAINE HCL (CARDIAC) 20 MG/ML IV SOLN
INTRAVENOUS | Status: DC | PRN
  Administered 2015-05-09: 60 mg via INTRAVENOUS

## 2015-05-09 MED ORDER — ONDANSETRON HCL 4 MG/2ML IJ SOLN
INTRAMUSCULAR | Status: AC
  Filled 2015-05-09: qty 2

## 2015-05-09 MED ORDER — 0.9 % SODIUM CHLORIDE (POUR BTL) OPTIME
TOPICAL | Status: DC | PRN
  Administered 2015-05-09: 1000 mL

## 2015-05-09 MED ORDER — CEFAZOLIN SODIUM-DEXTROSE 2-3 GM-% IV SOLR
INTRAVENOUS | Status: AC
  Filled 2015-05-09: qty 50

## 2015-05-09 MED ORDER — IBUPROFEN 200 MG PO TABS
200.0000 mg | ORAL_TABLET | Freq: Four times a day (QID) | ORAL | Status: DC | PRN
  Administered 2015-05-11: 200 mg via ORAL
  Filled 2015-05-09: qty 1

## 2015-05-09 MED ORDER — METOCLOPRAMIDE HCL 5 MG PO TABS: 5.0000 mg | ORAL_TABLET | Freq: Three times a day (TID) | ORAL | Status: DC | PRN

## 2015-05-09 SURGICAL SUPPLY — 64 items
BANDAGE ELASTIC 6 VELCRO ST LF (GAUZE/BANDAGES/DRESSINGS) ×2 IMPLANT
BIT DRILL 2.0 (BIT) ×2 IMPLANT
BIT DRILL 2.6 (BIT) ×2 IMPLANT
BNDG ESMARK 4X9 LF (GAUZE/BANDAGES/DRESSINGS) ×2 IMPLANT
CANISTER SUCTION 1500CC (MISCELLANEOUS) IMPLANT
CLSR STERI-STRIP ANTIMIC 1/2X4 (GAUZE/BANDAGES/DRESSINGS) ×2 IMPLANT
CORDS BIPOLAR (ELECTRODE) ×2 IMPLANT
COVER SURGICAL LIGHT HANDLE (MISCELLANEOUS) ×2 IMPLANT
CUFF TOURNIQUET SINGLE 18IN (TOURNIQUET CUFF) ×2 IMPLANT
CUFF TOURNIQUET SINGLE 24IN (TOURNIQUET CUFF) IMPLANT
DERMABOND ADVANCED (GAUZE/BANDAGES/DRESSINGS) ×1
DERMABOND ADVANCED .7 DNX12 (GAUZE/BANDAGES/DRESSINGS) ×1 IMPLANT
DRAPE C-ARM 42X72 X-RAY (DRAPES) ×2 IMPLANT
DRAPE IMP U-DRAPE 54X76 (DRAPES) ×2 IMPLANT
DRAPE INCISE IOBAN 66X45 STRL (DRAPES) ×2 IMPLANT
DRAPE U-SHAPE 47X51 STRL (DRAPES) ×2 IMPLANT
DRSG MEPILEX BORDER 4X8 (GAUZE/BANDAGES/DRESSINGS) ×2 IMPLANT
DRSG TEGADERM 4X4.75 (GAUZE/BANDAGES/DRESSINGS) IMPLANT
ELECT CAUTERY BLADE 6.4 (BLADE) ×2 IMPLANT
ELECT REM PT RETURN 9FT ADLT (ELECTROSURGICAL) ×2
ELECTRODE REM PT RTRN 9FT ADLT (ELECTROSURGICAL) ×1 IMPLANT
FACESHIELD WRAPAROUND (MASK) ×2 IMPLANT
GAUZE SPONGE 4X4 12PLY STRL (GAUZE/BANDAGES/DRESSINGS) IMPLANT
GAUZE XEROFORM 1X8 LF (GAUZE/BANDAGES/DRESSINGS) IMPLANT
GAUZE XEROFORM 5X9 LF (GAUZE/BANDAGES/DRESSINGS) IMPLANT
GLOVE NEODERM STRL 7.5 LF PF (GLOVE) ×3 IMPLANT
GLOVE SURG NEODERM 7.5  LF PF (GLOVE) ×3
GOWN STRL REIN XL XLG (GOWN DISPOSABLE) ×4 IMPLANT
KIT BASIN OR (CUSTOM PROCEDURE TRAY) ×2 IMPLANT
KIT ROOM TURNOVER OR (KITS) ×2 IMPLANT
LOOP VESSEL MAXI BLUE (MISCELLANEOUS) ×2 IMPLANT
MANIFOLD NEPTUNE II (INSTRUMENTS) ×2 IMPLANT
NS IRRIG 1000ML POUR BTL (IV SOLUTION) ×2 IMPLANT
PACK SHOULDER (CUSTOM PROCEDURE TRAY) ×2 IMPLANT
PACK UNIVERSAL I (CUSTOM PROCEDURE TRAY) ×2 IMPLANT
PAD ARMBOARD 7.5X6 YLW CONV (MISCELLANEOUS) ×2 IMPLANT
PAD CAST 4YDX4 CTTN HI CHSV (CAST SUPPLIES) ×4 IMPLANT
PADDING CAST COTTON 4X4 STRL (CAST SUPPLIES) ×4
PLATE MEDIAL 3 HOLE (Plate) ×2 IMPLANT
SCREW BONE 3.5X30MM (Screw) IMPLANT
SCREW BONE 3.5X32MM (Screw) ×2 IMPLANT
SCREW LOCK T10 FT 18X2.7 (Screw) ×1 IMPLANT
SCREW LOCKING 2.7MMX18MM (Screw) ×1 IMPLANT
SCREW LOCKING 2.7X28MM (Screw) ×2 IMPLANT
SCREW LOCKING 2.7X30MM (Screw) ×2 IMPLANT
SCREW NON LOCKING 22MM (Screw) ×4 IMPLANT
SLING ARM FOAM STRAP LRG (SOFTGOODS) ×2 IMPLANT
SLING ARM IMMOBILIZER LRG (SOFTGOODS) ×2 IMPLANT
SPLINT FIBERGLASS 4X30 (CAST SUPPLIES) ×2 IMPLANT
SPONGE LAP 18X18 X RAY DECT (DISPOSABLE) ×4 IMPLANT
STAPLER VISISTAT 35W (STAPLE) IMPLANT
STRIP CLOSURE SKIN 1/2X4 (GAUZE/BANDAGES/DRESSINGS) IMPLANT
SUCTION FRAZIER TIP 10 FR DISP (SUCTIONS) IMPLANT
SUT ETHILON 3 0 PS 1 (SUTURE) ×4 IMPLANT
SUT MNCRL AB 4-0 PS2 18 (SUTURE) ×2 IMPLANT
SUT VIC AB 0 CT1 27 (SUTURE) ×2
SUT VIC AB 0 CT1 27XBRD ANBCTR (SUTURE) ×2 IMPLANT
SUT VIC AB 2-0 CT1 27 (SUTURE) ×2
SUT VIC AB 2-0 CT1 TAPERPNT 27 (SUTURE) ×2 IMPLANT
SYR CONTROL 10ML LL (SYRINGE) IMPLANT
TOWEL OR 17X24 6PK STRL BLUE (TOWEL DISPOSABLE) IMPLANT
TOWEL OR 17X26 10 PK STRL BLUE (TOWEL DISPOSABLE) ×2 IMPLANT
UNDERPAD 30X30 INCONTINENT (UNDERPADS AND DIAPERS) IMPLANT
WATER STERILE IRR 1000ML POUR (IV SOLUTION) IMPLANT

## 2015-05-09 NOTE — Discharge Instructions (Signed)
Postoperative instructions: ° °Weightbearing: Non weight bearing ° °Dressing instructions: Keep your dressing and/or splint clean and dry at all times.  It will be removed at your first post-operative appointment.  Your stitches and/or staples will be removed at this visit. ° °Incision instructions:  Do not soak your incision for 3 weeks after surgery.  If the incision gets wet, pat dry and do not scrub the incision. ° °Pain control:  You have been given a prescription to be taken as directed for post-operative pain control.  In addition, elevate the operative extremity above the heart at all times to prevent swelling and throbbing pain. ° °Take over-the-counter Colace, 100mg by mouth twice a day while taking narcotic pain medications to help prevent constipation. ° °Follow up appointments: °1) 10-14 days for suture removal and wound check. °2) Dr. Demontae Antunes as scheduled. ° ° ------------------------------------------------------------------------------------------------------------- ° °After Surgery Pain Control: ° °After your surgery, post-surgical discomfort or pain is likely. This discomfort can last several days to a few weeks. At certain times of the day your discomfort may be more intense.  °Did you receive a nerve block?  °A nerve block can provide pain relief for one hour to two days after your surgery. As long as the nerve block is working, you will experience little or no sensation in the area the surgeon operated on.  °As the nerve block wears off, you will begin to experience pain or discomfort. It is very important that you begin taking your prescribed pain medication before the nerve block fully wears off. Treating your pain at the first sign of the block wearing off will ensure your pain is better controlled and more tolerable when full-sensation returns. Do not wait until the pain is intolerable, as the medicine will be less effective. It is better to treat pain in advance than to try and catch up.    °General Anesthesia:  °If you did not receive a nerve block during your surgery, you will need to start taking your pain medication shortly after your surgery and should continue to do so as prescribed by your surgeon.  °Pain Medication:  °Most commonly we prescribe Vicodin and Percocet for post-operative pain. Both of these medications contain a combination of acetaminophen (Tylenol®) and a narcotic to help control pain.  °· It takes between 30 and 45 minutes before pain medication starts to work. It is important to take your medication before your pain level gets too intense.  °· Nausea is a common side effect of many pain medications. You will want to eat something before taking your pain medicine to help prevent nausea.  °· If you are taking a prescription pain medication that contains acetaminophen, we recommend that you do not take additional over the counter acetaminophen (Tylenol®).  °Other pain relieving options:  °· Using a cold pack to ice the affected area a few times a day (15 to 20 minutes at a time) can help to relieve pain, reduce swelling and bruising.  °· Elevation of the affected area can also help to reduce pain and swelling. ° ° ° °

## 2015-05-09 NOTE — Transfer of Care (Signed)
Immediate Anesthesia Transfer of Care Note  Patient: Lauren CaroliMargaret L Dack  Procedure(s) Performed: Procedure(s): OPEN REDUCTION INTERNAL FIXATION (ORIF) LEFT MEDIAL HUMERAL CONDYLE FRACTURE (Left)  Patient Location: PACU  Anesthesia Type:General  Level of Consciousness: awake, alert , oriented and patient cooperative  Airway & Oxygen Therapy: Patient Spontanous Breathing and Patient connected to nasal cannula oxygen  Post-op Assessment: Report given to RN, Post -op Vital signs reviewed and stable and Patient moving all extremities X 4  Post vital signs: Reviewed and stable  Last Vitals:  Filed Vitals:   05/09/15 1345  BP: 134/62  Pulse: 87  Temp: 36.4 C  Resp: 24    Complications: No apparent anesthesia complications

## 2015-05-09 NOTE — Progress Notes (Signed)
Pt to OR.

## 2015-05-09 NOTE — Anesthesia Postprocedure Evaluation (Signed)
Anesthesia Post Note  Patient: Lauren Avery  Procedure(s) Performed: Procedure(s) (LRB): OPEN REDUCTION INTERNAL FIXATION (ORIF) LEFT MEDIAL HUMERAL CONDYLE FRACTURE (Left)  Anesthesia type: General  Patient location: PACU  Post pain: Pain level controlled and Adequate analgesia  Post assessment: Post-op Vital signs reviewed, Patient's Cardiovascular Status Stable, Respiratory Function Stable, Patent Airway and Pain level controlled  Last Vitals:  Filed Vitals:   05/09/15 1500  BP: 102/48  Pulse: 74  Temp:   Resp: 19    Post vital signs: Reviewed and stable  Level of consciousness: awake, alert  and oriented  Complications: No apparent anesthesia complications

## 2015-05-09 NOTE — Anesthesia Preprocedure Evaluation (Signed)
Anesthesia Evaluation  Patient identified by MRN, date of birth, ID band Patient awake    Reviewed: Allergy & Precautions, NPO status , Patient's Chart, lab work & pertinent test results  Airway Mallampati: II   Neck ROM: full    Dental   Pulmonary neg pulmonary ROS,  breath sounds clear to auscultation        Cardiovascular hypertension, Rhythm:regular Rate:Normal     Neuro/Psych    GI/Hepatic GERD-  ,  Endo/Other  diabetes, Type 2  Renal/GU      Musculoskeletal   Abdominal   Peds  Hematology   Anesthesia Other Findings   Reproductive/Obstetrics                             Anesthesia Physical Anesthesia Plan  ASA: II  Anesthesia Plan: General and Regional   Post-op Pain Management: MAC Combined w/ Regional for Post-op pain   Induction: Intravenous  Airway Management Planned: LMA  Additional Equipment:   Intra-op Plan:   Post-operative Plan:   Informed Consent: I have reviewed the patients History and Physical, chart, labs and discussed the procedure including the risks, benefits and alternatives for the proposed anesthesia with the patient or authorized representative who has indicated his/her understanding and acceptance.     Plan Discussed with: CRNA, Anesthesiologist and Surgeon  Anesthesia Plan Comments:         Anesthesia Quick Evaluation

## 2015-05-09 NOTE — Anesthesia Procedure Notes (Signed)
Procedure Name: Intubation Date/Time: 05/09/2015 11:53 AM Performed by: Patrcia DollyMOSES, Lauren Pickford Pre-anesthesia Checklist: Patient identified, Patient being monitored, Timeout performed, Emergency Drugs available and Suction available Patient Re-evaluated:Patient Re-evaluated prior to inductionOxygen Delivery Method: Circle System Utilized Preoxygenation: Pre-oxygenation with 100% oxygen Intubation Type: IV induction Ventilation: Mask ventilation without difficulty LMA Size: 4.0 Number of attempts: 1 Placement Confirmation: positive ETCO2 and breath sounds checked- equal and bilateral Secured at: 21 cm Tube secured with: Tape Dental Injury: Teeth and Oropharynx as per pre-operative assessment

## 2015-05-09 NOTE — Op Note (Signed)
   Date of surgery: 05/09/2015  Preoperative diagnosis: Left distal humeral medial condyle fracture  Postoperative diagnosis: Same  Procedure:  1. Open treatment with internal fixation of left humeral medial condyle fracture 2. Subcutaneous transposition of the ulnar nerve  Implants: [employer] Variax 3 hole medial distal humeral plate  Surgeon: Glee ArvinMichael Durwood Dittus, M.D.  Anesthesia: General and regional  Estimated blood loss: Minimal  Tourniquet time: 59 minutes  Complications: None  Condition to PACU: Stable  Indications for procedure: The patient is a 81 year old female who presents today for operative fixation of her displaced medial condyle fracture. She was aware of the risks benefits alternatives to surgery and she wished to proceed. Consent was signed. Next  Description of procedure: The patient was identified in the preoperative holding area. The operative site was marked by the surgeon confirmed with the patient. He is brought back to the operating room. She was placed supine on the table. General anesthesia was induced. She was then placed in the right lateral decubitus position with the left arm suspended with a sure foot.  The lower extremities were well-padded and SCDs were placed.  Left upper extremity was prepped and draped in standard sterile fashion. Timeout was performed. Preoperative antibiotics were given.  A posterior incision over the distal humerus and elbow was used. Full-thickness flaps were raised both medially and laterally. The ulnar nerve was identified approximately 8 cm proximal to the medial epicondyle between the medial head of the triceps and the intermuscular septum. The ulnar nerve was then handled with care and mobilized both proximally and distally. A vessel loop was placed around the nerve and protected during the entire procedure. A subcutaneous pocket was created for the transposition of the ulnar nerve. We then exposed the fracture by elevating the triceps  off of the posterior aspect of the distal humerus. The fracture was identified. There was gross motion and minimal callus formation. We removed any immature callus an organized hematoma using a rongeur. A reduction was obtained and this was held in place with a 1.6 mm K wire. The reduction was confirmed under fluoroscopy. We then placed the 3 hole medial distal humeral plate on the medial aspect of the distal humerus. With the fracture reduced we placed an apex screw in the shaft. The fracture remained reduced. We then used a large tenaculum clamp to compress the fracture. We removed the K wire. We then placed three 2.7 mm locking screws in the medial condyle through the plate.  We then placed 2 more 3.5 mm nonlocking screws in the shaft. Final x-rays were taken to confirm fracture reduction and adequate placement of the plate. The wound was then thoroughly irrigated and closed in layer fashion.  A subcutaneous transposition of the ulnar nerve was performed. The subcutaneous tissue was closed with 2.0 vicryl and the skin was closed with a running 4-0 Monocryl.  Dermabond was placed and then Steri-Strips were placed on top of that. Sterile dressings were applied. The arm was immobilized in a posterior splint at 90. The patient tolerated the procedure well was extubated and transferred to the PACU in stable condition. All sponge counts were correct.  Postoperative plan: Patient will be nonweightbearing to the left upper extremity. She will be evaluated by therapy in the morning. She will likely need placement in a skilled nursing facility given the fact that she is minimally ambulatory.  Mayra ReelN. Michael Jaylee Freeze, MD [facility] [phone] 1:59 PM

## 2015-05-10 ENCOUNTER — Encounter (HOSPITAL_COMMUNITY): Payer: Self-pay | Admitting: Orthopaedic Surgery

## 2015-05-10 LAB — CBC WITH DIFFERENTIAL/PLATELET
Basophils Absolute: 0 10*3/uL (ref 0.0–0.1)
Basophils Relative: 0 % (ref 0–1)
Eosinophils Absolute: 0.1 10*3/uL (ref 0.0–0.7)
Eosinophils Relative: 1 % (ref 0–5)
HCT: 33 % — ABNORMAL LOW (ref 36.0–46.0)
Hemoglobin: 10.7 g/dL — ABNORMAL LOW (ref 12.0–15.0)
Lymphocytes Relative: 29 % (ref 12–46)
Lymphs Abs: 2.3 10*3/uL (ref 0.7–4.0)
MCH: 28.9 pg (ref 26.0–34.0)
MCHC: 32.4 g/dL (ref 30.0–36.0)
MCV: 89.2 fL (ref 78.0–100.0)
Monocytes Absolute: 0.7 10*3/uL (ref 0.1–1.0)
Monocytes Relative: 8 % (ref 3–12)
Neutro Abs: 4.9 10*3/uL (ref 1.7–7.7)
Neutrophils Relative %: 62 % (ref 43–77)
Platelets: 92 10*3/uL — ABNORMAL LOW (ref 150–400)
RBC: 3.7 MIL/uL — ABNORMAL LOW (ref 3.87–5.11)
RDW: 13.9 % (ref 11.5–15.5)
WBC: 7.9 10*3/uL (ref 4.0–10.5)

## 2015-05-10 LAB — GLUCOSE, CAPILLARY
Glucose-Capillary: 132 mg/dL — ABNORMAL HIGH (ref 65–99)
Glucose-Capillary: 146 mg/dL — ABNORMAL HIGH (ref 65–99)
Glucose-Capillary: 220 mg/dL — ABNORMAL HIGH (ref 65–99)
Glucose-Capillary: 157 mg/dL — ABNORMAL HIGH (ref 65–99)

## 2015-05-10 LAB — BASIC METABOLIC PANEL
Anion gap: 8 (ref 5–15)
BUN: 15 mg/dL (ref 6–20)
CO2: 26 mmol/L (ref 22–32)
Calcium: 8.5 mg/dL — ABNORMAL LOW (ref 8.9–10.3)
Chloride: 103 mmol/L (ref 101–111)
Creatinine, Ser: 1.02 mg/dL — ABNORMAL HIGH (ref 0.44–1.00)
GFR calc Af Amer: 56 mL/min — ABNORMAL LOW (ref >60)
GFR calc non Af Amer: 48 mL/min — ABNORMAL LOW (ref >60)
Glucose, Bld: 143 mg/dL — ABNORMAL HIGH (ref 65–99)
Potassium: 4.4 mmol/L (ref 3.5–5.1)
Sodium: 137 mmol/L (ref 135–145)

## 2015-05-10 LAB — HEMOGLOBIN A1C
Hgb A1c MFr Bld: 7.6 % — ABNORMAL HIGH (ref 4.8–5.6)
Mean Plasma Glucose: 171 mg/dL

## 2015-05-10 NOTE — Progress Notes (Signed)
Occupational Therapy Evaluation Patient Details Name: Lauren Avery MRN: [id number] DOB: [birth year] Today's Date: 05/10/2015    History of Present Illness Patient is a 81 y/o female s/p ORIF left humeral medial condyle fracture. NWB LUE. PMH includes HTN, osteoporosis, pulmonary fibrosis and DM.    Clinical Impression   Patient presenting with decreased independence with ADLs and functional mobility/transfers secondary to immobilization > LUE and h/o failed RLE hip replacement. Patient mod I using rollator PTA. Patient currently requires up to mod assist for functional transfers and total assist for ADLs. Patient will benefit from acute OT to increase overall independence in the areas of ADLs, functional mobility, and overall safety in order to safely discharge to venue listed below.     Follow Up Recommendations  SNF;Supervision/Assistance - 24 hour    Equipment Recommendations  Other (comment) (TBD next venue of care)    Recommendations for Other Services  None at this time  Precautions / Restrictions Precautions Precautions: Fall Required Braces or Orthoses: Sling Restrictions Weight Bearing Restrictions: Yes LLE Weight Bearing: Non weight bearing     Mobility Bed Mobility Overal bed mobility: Needs Assistance Bed Mobility: Supine to Sit     Supine to sit: Mod assist;HOB elevated     General bed mobility comments: Mod A to elevate trunk. Cues for technique and use of rail for support.   Transfers Overall transfer level: Needs assistance Equipment used: Rolling walker (2 wheeled);None Transfers: Sit to/from [employer] to Stand: Min assist Stand pivot transfers: Min assist       General transfer comment: Min A SPT bed to [facility] with cues for safety/technique. Unsteady on feet. + dizziness upon standing. Resolved.     Balance Overall balance assessment: Needs assistance;History of Falls Sitting-balance support: Feet supported;No upper  extremity supported Sitting balance-Leahy Scale: Fair     Standing balance support: During functional activity Standing balance-Leahy Scale: Poor Standing balance comment: Relient on UE support for balance/safety.     ADL Overall ADL's : Needs assistance/impaired General ADL Comments: Pt overall total assist due to immobilization of LUE and right failed hip replacement. Patient mod assist for sit<>stands and functional transfers (assistance needed for lift/lower).      Vision Additional Comments: no change from baseline          Pertinent Vitals/Pain Pain Assessment: Faces Faces Pain Scale: Hurts little more Pain Location: LUE Pain Descriptors / Indicators: Grimacing Pain Intervention(s): Monitored during session;Repositioned;Limited activity within patient's tolerance     Hand Dominance Left   Extremity/Trunk Assessment Upper Extremity Assessment Upper Extremity Assessment: LUE deficits/detail LUE Deficits / Details: Limited testing secondary to LUE in sling and recent surgery. Encouraged digit AROM. Squeeze ball administered.  LUE: Unable to fully assess due to pain;Unable to fully assess due to immobilization   Lower Extremity Assessment Lower Extremity Assessment: Defer to PT evaluation   Cervical / Trunk Assessment Cervical / Trunk Assessment: Normal   Communication Communication Communication: No difficulties   Cognition Arousal/Alertness: Awake/alert Behavior During Therapy: WFL for tasks assessed/performed Overall Cognitive Status: Within Functional Limits for tasks assessed              Home Living Family/patient expects to be discharged to:: Skilled nursing facility Additional Comments: Pt lives alone, children can check on pt once d/c'd home. No life alert.       Prior Functioning/Environment Level of Independence: Needs assistance  Gait / Transfers Assistance Needed: Pt minimally ambulatory at home due to failed hip replacement. Household  ambulation with rollator and uses w/c for longer distances. Assist to negotiate steps.  ADL's / Homemaking Assistance Needed: Family assists with IADLs and driving.         OT Diagnosis: Generalized weakness;Acute pain   OT Problem List: Decreased strength;Decreased activity tolerance;Impaired balance (sitting and/or standing);Decreased safety awareness;Decreased knowledge of use of DME or AE;Decreased knowledge of precautions;Pain   OT Treatment/Interventions: Self-care/ADL training;Therapeutic exercise;Energy conservation;DME and/or AE instruction;Therapeutic activities;Patient/family education;Balance training    OT Goals(Current goals can be found in the care plan section) Acute Rehab OT Goals Patient Stated Goal: to go rehab OT Goal Formulation: With patient/family Time For Goal Achievement: 05/17/15 Potential to Achieve Goals: Good ADL Goals Pt Will Perform Grooming: sitting;with set-up (unsupported) Pt Will Perform Upper Body Bathing: with set-up;sitting (unsupported) Pt Will Perform Lower Body Bathing: sit to/from stand;with mod assist Pt Will Perform Upper Body Dressing: with set-up;sitting (unsupported) Pt Will Perform Lower Body Dressing: with mod assist;sit to/from stand Pt Will Transfer to Toilet: with supervision;bedside commode;stand pivot transfer Additional ADL Goal #1: Pt will be independent with directing care for elevation and HEP using squeeze ball  OT Frequency: Min 2X/week   Barriers to D/C: Decreased caregiver support   End of Session Equipment Utilized During Treatment: Other (comment) (sling > LUE)  Activity Tolerance: Patient tolerated treatment well Patient left: in chair;with call bell/phone within reach;with family/visitor present   Time: 5409-8119 OT Time Calculation (min): 32 min Charges:  OT General Charges $OT Visit: 1 Procedure OT Evaluation $Initial OT Evaluation Tier I: 1 Procedure OT Treatments $Self Care/Home Management : 8-22  mins  Keeya Dyckman , MS, OTR/L, CLT Pager: [phone]  05/10/2015, 2:10 PM

## 2015-05-10 NOTE — Progress Notes (Signed)
Orthopedic Tech Progress Note Patient Details:  Lauren Avery [birth year] [id number]  Patient ID: Lauren Avery, female   DOB: [birth year], 81 y.o.   MRN: [id number] Pt unable to use trapeze bar patient helper  Nikki Dom 05/10/2015, 11:29 PM

## 2015-05-10 NOTE — Progress Notes (Signed)
Orthopedic Tech Progress Note Patient Details:  Lauren Avery [birth year] [id number] Pt. unable to use OHF with trapeze due to upper extremity fracture. Patient ID: Lauren Avery, female   DOB: February 06, 1927, 81 y.o.   MRN: [id number]   Lesle Chris 05/10/2015, 1:44 PM

## 2015-05-10 NOTE — Care Management Utilization Note (Signed)
Utilization review completed by Estalee Mccandlish N. Jolaine Fryberger, RN BSN 

## 2015-05-10 NOTE — Evaluation (Signed)
Physical Therapy Evaluation Patient Details Name: Lauren Avery MRN: [id number] DOB: [birth year] Today's Date: 05/10/2015   History of Present Illness  Patient is a 81 y/o female s/p ORIF left humeral medial condyle fracture. NWB LUE. PMH includes HTN, osteoporosis, pulmonary fibrosis and DM.   Clinical Impression  Patient presents with pain, post surgical deficits LUE s/p above surgery and weakness impacting safe mobility. Pt household ambulator at home due to hx of hip issues. Pt not safe to return home alone due to balance deficits and new WB status LUE making mobility and participating in ADLs difficult. Pt high falls risk. Would benefit from skilled PT and ST SNF to maximize independence and safe mobility prior to return home.    Follow Up Recommendations SNF;Supervision/Assistance - 24 hour    Equipment Recommendations  None recommended by PT    Recommendations for Other Services       Precautions / Restrictions Precautions Precautions: Fall Required Braces or Orthoses: Sling Restrictions Weight Bearing Restrictions: Yes LLE Weight Bearing: Non weight bearing      Mobility  Bed Mobility Overal bed mobility: Needs Assistance Bed Mobility: Supine to Sit     Supine to sit: Mod assist;HOB elevated     General bed mobility comments: Mod A to elevate trunk. Cues for technique and use of rail for support.   Transfers Overall transfer level: Needs assistance Equipment used: Rolling walker (2 wheeled);None Transfers: Sit to/from [employer] to Stand: Min assist Stand pivot transfers: Min assist       General transfer comment: Min A SPT bed to [facility] with cues for safety/technique. Unsteady on feet. + dizziness upon standing. Resolved.   Ambulation/Gait Ambulation/Gait assistance: Min assist Ambulation Distance (Feet): 5 Feet Assistive device: 1 person hand held assist Gait Pattern/deviations: Step-to pattern;Decreased stride length;Decreased  stance time - right;Decreased step length - left;Antalgic   Gait velocity interpretation: Below normal speed for age/gender General Gait Details: Slow, unsteady with decreased stance time RLE secondary to pain from failed hip replacement. Min A for balance.   Stairs            Wheelchair Mobility    Modified Rankin (Stroke Patients Only)       Balance Overall balance assessment: Needs assistance;History of Falls Sitting-balance support: Feet supported;No upper extremity supported Sitting balance-Leahy Scale: Fair     Standing balance support: During functional activity Standing balance-Leahy Scale: Poor Standing balance comment: Relient on UE support for balance/safety.                              Pertinent Vitals/Pain Pain Assessment: Faces Faces Pain Scale: Hurts little more Pain Location: LUE Pain Descriptors / Indicators: Sore;Aching Pain Intervention(s): Monitored during session;Premedicated before session;Repositioned;Ice applied    Home Living Family/patient expects to be discharged to:: Skilled nursing facility                      Prior Function Level of Independence: Needs assistance   Gait / Transfers Assistance Needed: Pt minimally ambulatory at home due to failed hip replacement. Household ambulation with rollator and uses w/c for longer distances. Assist to negotiate steps.   ADL's / Homemaking Assistance Needed: Family assists with IADLs and driving.         Hand Dominance   Dominant Hand: Left    Extremity/Trunk Assessment   Upper Extremity Assessment: Defer to OT evaluation;LUE deficits/detail  LUE Deficits / Details: Limited testing secondary to LUE in sling and recent surgery. Encouraged digit AROM.   Lower Extremity Assessment: Generalized weakness (Grossly ~3+/5  throughout hip, knee.)         Communication   Communication: No difficulties  Cognition Arousal/Alertness: Awake/alert Behavior During  Therapy: WFL for tasks assessed/performed Overall Cognitive Status: Within Functional Limits for tasks assessed                      General Comments General comments (skin integrity, edema, etc.): Pt's daughter present inr oom during session.     Exercises        Assessment/Plan    PT Assessment Patient needs continued PT services  PT Diagnosis Acute pain;Difficulty walking   PT Problem List Decreased strength;Pain;Decreased range of motion;Decreased activity tolerance;Decreased balance;Decreased mobility  PT Treatment Interventions Balance training;Gait training;Functional mobility training;Therapeutic activities;Therapeutic exercise;Patient/family education;DME instruction   PT Goals (Current goals can be found in the Care Plan section) Acute Rehab PT Goals Patient Stated Goal: to go rehab PT Goal Formulation: With patient/family Time For Goal Achievement: 05/24/15 Potential to Achieve Goals: Good    Frequency Min 3X/week   Barriers to discharge Decreased caregiver support;Inaccessible home environment Pt lives alone and has steps to enter- having difficulty at baseline.    Co-evaluation               End of Session Equipment Utilized During Treatment: Gait belt;Other (comment) (sling LUE) Activity Tolerance: Patient tolerated treatment well Patient left: in chair;with call bell/phone within reach;with family/visitor present Nurse Communication: Mobility status         Time: 0923-3007 PT Time Calculation (min) (ACUTE ONLY): 28 min   Charges:   PT Evaluation $Initial PT Evaluation Tier I: 1 Procedure PT Treatments $Therapeutic Activity: 8-22 mins   PT G Codes:        Lauren Avery A Lauren Avery 05/10/2015, 11:35 AM Lauren Avery, PT, DPT [phone]

## 2015-05-10 NOTE — Progress Notes (Signed)
   Subjective:  Patient reports block is starting to wear off.  Objective:   VITALS:   Filed Vitals:   05/09/15 1959 05/10/15 0022 05/10/15 0501 05/10/15 0710  BP: 114/58 126/59 103/45 116/47  Pulse: 91 100 78   Temp: 97.8 F (36.6 C) 98.7 F (37.1 C) 98.6 F (37 C) 98.3 F (36.8 C)  TempSrc: Oral  Oral   Resp: 19 18 18    Height:      Weight:      SpO2: 98% 98% 98%     Neurologically intact Neurovascular intact Sensation intact distally Intact pulses distally Incision: dressing C/D/I and no drainage No cellulitis present Compartment soft  Ulnar nerve function intact   Lab Results  Component Value Date   WBC 7.9 05/10/2015   HGB 10.7* 05/10/2015   HCT 33.0* 05/10/2015   MCV 89.2 05/10/2015   PLT 92* 05/10/2015     Assessment/Plan:  1 Day Post-Op   - Expected postop acute blood loss anemia - will monitor for symptoms - Up with PT/OT - DVT ppx - SCDs, ambulation, daily asa - NWB left upper extremity - patient is minimally ambulatory at baseline due to failed total hip replacement, she uses her UEs to help transfer.  Mainly wheelchair bound.  Patient would benefit from SNF placement  Cheral Almas 05/10/2015, 7:47 AM [phone]

## 2015-05-11 ENCOUNTER — Other Ambulatory Visit: Payer: Self-pay

## 2015-05-11 LAB — GLUCOSE, CAPILLARY
Glucose-Capillary: 166 mg/dL — ABNORMAL HIGH (ref 65–99)
Glucose-Capillary: 149 mg/dL — ABNORMAL HIGH (ref 65–99)

## 2015-05-11 MED ORDER — TRAMADOL HCL 50 MG PO TABS: 50.0000 mg | ORAL_TABLET | Freq: Four times a day (QID) | ORAL | Status: DC | PRN

## 2015-05-11 NOTE — Clinical Social Work Placement (Signed)
   CLINICAL SOCIAL WORK PLACEMENT  NOTE  Date:  05/11/2015  Patient Details  Name: Lauren Avery MRN: [id number] Date of Birth: 12-Nov-1927  Clinical Social Work is seeking post-discharge placement for this patient at the Skilled  Nursing Facility level of care (*CSW will initial, date and re-position this form in  chart as items are completed):  Yes   Patient/family provided with [place] Clinical Social Work Department's list of facilities offering this level of care within the geographic area requested by the patient (or if unable, by the patient's family).  Yes   Patient/family informed of their freedom to choose among providers that offer the needed level of care, that participate in Medicare, Medicaid or managed care program needed by the patient, have an available bed and are willing to accept the patient.  Yes   Patient/family informed of [facility]'s ownership interest in [facility] and [facility], as well as of the fact that they are under no obligation to receive care at these facilities.  PASRR submitted to EDS on  (n/a)     PASRR number received on  (n/a)     Existing PASRR number confirmed on 05/11/15     FL2 transmitted to all facilities in geographic area requested by pt/family on 05/11/15     FL2 transmitted to all facilities within larger geographic area on  (n/a)     Patient informed that his/her managed care company has contracts with or will negotiate with certain facilities, including the following:   (yes, [facility])     Yes   Patient/family informed of bed offers received.  Patient chooses bed at [facility]     Physician recommends and patient chooses bed at  (n/a)    Patient to be transferred to [facility] on 05/11/15.  Patient to be transferred to facility by [facility]     Patient family notified on 05/11/15 of transfer.  Name of family member notified:  Patient and patient's daughter updated at bedside.     PHYSICIAN      Additional Comment:    _______________________________________________ Rod Mae, LCSW 05/11/2015, 11:48 AM [phone]

## 2015-05-11 NOTE — Clinical Social Work Note (Signed)
[facility] Medicare authorization: [id number] Next review date: 05/14/15 RUG level: RVB  Marcelline Deist, [place] - [phone] Clinical Social Work Department Orthopedics [phone]) and Surgical [phone])

## 2015-05-11 NOTE — Progress Notes (Signed)
Report called to [facility] 

## 2015-05-11 NOTE — Progress Notes (Signed)
Inpatient Diabetes Program Recommendations  AACE/ADA: New Consensus Statement on Inpatient Glycemic Control (2013)  Target Ranges:  Prepandial:   less than 140 mg/dL      Peak postprandial:   less than 180 mg/dL (1-2 hours)      Critically ill patients:  140 - 180 mg/dL   Inpatient Diabetes Program Recommendations Correction (SSI): consider adding Novolog sensitive correction if CBGs remain elevated Thank you  Piedad Climes BSN, RN,CDE Inpatient Diabetes Coordinator [phone] (team pager)

## 2015-05-11 NOTE — Progress Notes (Signed)
Physical Therapy Treatment Patient Details Name: Lauren Avery MRN: [id number] DOB: June 22, 1927 Today's Date: 05/11/2015    History of Present Illness Patient is a 81 y/o female s/p ORIF left humeral medial condyle fracture. NWB LUE. PMH includes HTN, osteoporosis, pulmonary fibrosis and DM.     PT Comments    Patient very sweet and motivated to progress with ambulation. Moving well with increase pain in R hip. Continue to recommend SNF for ongoing Physical Therapy.     Follow Up Recommendations  SNF;Supervision/Assistance - 24 hour     Equipment Recommendations  None recommended by PT    Recommendations for Other Services       Precautions / Restrictions Precautions Precautions: Fall Required Braces or Orthoses: Sling Restrictions LLE Weight Bearing: Non weight bearing    Mobility  Bed Mobility Overal bed mobility: Needs Assistance Bed Mobility: Sit to Supine       Sit to supine: Min assist   General bed mobility comments: Cues for technique and use of rail for support. Min A to support trunk  Transfers Overall transfer level: Needs assistance Equipment used: 1 person hand held assist   Sit to Stand: Min assist         General transfer comment: Min A to power up with cues for safety/technique.   Ambulation/Gait Ambulation/Gait assistance: Min assist Ambulation Distance (Feet): 30 Feet Assistive device: 1 person hand held assist Gait Pattern/deviations: Step-to pattern;Decreased stance time - right;Decreased step length - left   Gait velocity interpretation: Below normal speed for age/gender General Gait Details: Slow, unsteady with decreased stance time RLE secondary to pain from failed hip replacement. Min A for balance.    Stairs            Wheelchair Mobility    Modified Rankin (Stroke Patients Only)       Balance                                    Cognition Arousal/Alertness: Awake/alert Behavior During Therapy:  WFL for tasks assessed/performed Overall Cognitive Status: Within Functional Limits for tasks assessed                      Exercises      General Comments        Pertinent Vitals/Pain Faces Pain Scale: Hurts a little bit Pain Location: LUE Pain Descriptors / Indicators: Grimacing Pain Intervention(s): Repositioned    Home Living                      Prior Function            PT Goals (current goals can now be found in the care plan section) Progress towards PT goals: Progressing toward goals    Frequency  Min 3X/week    PT Plan Current plan remains appropriate    Co-evaluation             End of Session Equipment Utilized During Treatment: Gait belt Activity Tolerance: Patient tolerated treatment well Patient left: in bed;with call bell/phone within reach     Time: 0947-1007 PT Time Calculation (min) (ACUTE ONLY): 20 min  Charges:  $Gait Training: 8-22 mins                    G Codes:      Fredrich Birks 05/11/2015, 1:42 PM 05/11/2015 Robinette, Adline Potter PTA  [phone] pager [phone] office

## 2015-05-11 NOTE — Discharge Summary (Signed)
Physician Discharge Summary      Patient ID: Lauren Avery MRN: [id number] DOB/AGE: [birth year] 81 y.o.  Admit date: 05/09/2015 Discharge date: 05/11/2015  Admission Diagnoses:  <principal problem not specified>  Discharge Diagnoses:  Active Problems:   Fracture of elbow, medial condyle, left, closed   Fracture of medial condyle of elbow   Past Medical History  Diagnosis Date  . Osteoporosis   . Pulmonary fibrosis   . GERD (gastroesophageal reflux disease)   . Fracture of left clavicle     12/12 wore a sling  . Hypertension   . Diabetes mellitus   . Fracture 10/2012    left wrist    Surgeries: Procedure(s): OPEN REDUCTION INTERNAL FIXATION (ORIF) LEFT MEDIAL HUMERAL CONDYLE FRACTURE on 05/09/2015   Consultants (if any):    Discharged Condition: Improved  Hospital Course: ANNALYNNE IBANEZ is an 81 y.o. female who was admitted 05/09/2015 with a diagnosis of <principal problem not specified> and went to the operating room on 05/09/2015 and underwent the above named procedures.    She was given perioperative antibiotics:  Anti-infectives    Start     Dose/Rate Route Frequency Ordered Stop   05/09/15 1930  ceFAZolin (ANCEF) IVPB 2 g/50 mL premix     2 g 100 mL/hr over 30 Minutes Intravenous Every 6 hours 05/09/15 1814 05/10/15 1112   05/09/15 1130  ceFAZolin (ANCEF) IVPB 2 g/50 mL premix     2 g 100 mL/hr over 30 Minutes Intravenous To Surgery 05/08/15 1322 05/09/15 1155   05/09/15 1038  ceFAZolin (ANCEF) 2-3 GM-% IVPB SOLR    Comments:  Alferd Apa   : cabinet override      05/09/15 1038 05/09/15 2244    .  She was given sequential compression devices, early ambulation, and aspirin for DVT prophylaxis.  She benefited maximally from the hospital stay and there were no complications.    Recent vital signs:  Filed Vitals:   05/11/15 0459  BP: 125/49  Pulse: 93  Temp: 98.4 F (36.9 C)  Resp: 18    Recent laboratory studies:  Lab Results  Component  Value Date   HGB 10.7* 05/10/2015   HGB 12.4 05/09/2015   HGB 10.1* 02/20/2013   Lab Results  Component Value Date   WBC 7.9 05/10/2015   PLT 92* 05/10/2015   Lab Results  Component Value Date   INR 1.18 02/21/2013   Lab Results  Component Value Date   NA 137 05/10/2015   K 4.4 05/10/2015   CL 103 05/10/2015   CO2 26 05/10/2015   BUN 15 05/10/2015   CREATININE 1.02* 05/10/2015   GLUCOSE 143* 05/10/2015    Discharge Medications:     Medication List    TAKE these medications        acetaminophen 325 MG tablet  Commonly known as:  TYLENOL  Take 2 tablets (650 mg total) by mouth every 6 (six) hours as needed.     amLODipine 5 MG tablet  Commonly known as:  NORVASC  Take 5 mg by mouth every other day.     aspirin EC 325 MG tablet  Take 1 tablet (325 mg total) by mouth daily.     calcium carbonate 500 MG chewable tablet  Commonly known as:  TUMS - dosed in mg elemental calcium  Chew 1 tablet by mouth 2 (two) times daily with a meal.     calcium citrate-vitamin D 315-200 MG-UNIT per tablet  Commonly known as:  CITRACAL+D  Take 1 tablet by mouth 2 (two) times daily.     cetirizine 10 MG tablet  Commonly known as:  ZYRTEC  Take 1 tablet (10 mg total) by mouth daily.     glimepiride 2 MG tablet  Commonly known as:  AMARYL  Take 1 tablet (2 mg total) by mouth daily before breakfast.     HYDROcodone-acetaminophen 7.5-325 MG per tablet  Commonly known as:  NORCO  Take 1-2 tablets by mouth every 6 (six) hours as needed for moderate pain.     HYDROcodone-homatropine 5-1.5 MG/5ML syrup  Commonly known as:  HYCODAN  Take 5 mLs by mouth at bedtime as needed for cough.     ibuprofen 200 MG tablet  Commonly known as:  ADVIL,MOTRIN  Take 200 mg by mouth every 6 (six) hours as needed for fever, headache or mild pain.     losartan 50 MG tablet  Commonly known as:  COZAAR  Take 50 mg by mouth daily.     multivitamin with minerals tablet  Take 1 tablet by mouth  daily.     pantoprazole 40 MG tablet  Commonly known as:  PROTONIX  Take 1 tablet (40 mg total) by mouth 2 (two) times daily.     RECLAST IV  Inject into the vein. yearly     senna-docusate 8.6-50 MG per tablet  Commonly known as:  SENOKOT S  Take 1 tablet by mouth at bedtime as needed.     traMADol 50 MG tablet  Commonly known as:  ULTRAM  Take 1 tablet (50 mg total) by mouth every 6 (six) hours as needed (cough).        Diagnostic Studies: Dg Elbow 2 Views Left  05/09/2015   CLINICAL DATA:  ORIF left elbow  EXAM: DG C-ARM 61-120 MIN; LEFT ELBOW - 2 VIEW  TECHNIQUE: Fluoroscopic spot views  FLUOROSCOPY TIME:  If the device does not provide the exposure index:  Fluoroscopy Time (in minutes and seconds):  0 minutes 23 seconds  Number of Acquired Images:  2  COMPARISON:  04/25/2015  FINDINGS: There is a compression plate over the medial cortex of distal humeral shaft and condyle.Anatomic position and alignment of fracture fragments.  IMPRESSION: Acceptable postoperative appearance.   Electronically Signed   By: Esperanza Heir M.D.   On: 05/09/2015 14:00   Mr Elbow Left Wo Contrast  04/26/2015   CLINICAL DATA:  Left elbow pain and swelling. Difficulty extending the joint for 2-3 months. Hemarthrosis. Prednisone administration.  EXAM: MRI OF THE LEFT ELBOW WITHOUT CONTRAST  TECHNIQUE: Multiplanar, multisequence MR imaging of the elbow was performed. No intravenous contrast was administered.  COMPARISON:  02/19/2013  FINDINGS: TENDONS  Common forearm flexor origin: Mild tendinopathy with adjacent edema tracking in the adjacent palmaris longus, flexor digitorum superficialis, flexor carpi radialis, and pronator teres muscles.  Common forearm extensor origin: Tendon intact but with low-level edema in the adjacent extensor carpi radialis longus and extensor digitorum muscles.  Biceps: Low-level tenosynovitis for example image 14 series 6. The distal biceps tendon is slightly lax with a serpentine  appearance on image 19 series 6, but this is thought to be due to the low positioning (slightly flexed) rather than a tear. Adjacent edema in the distal brachialis muscle.  Triceps: Unremarkable  LIGAMENTS  Medial stabilizers: Unremarkable  Lateral stabilizers: Indistinct and probably torn lateral ulnar collateral ligament.  Cartilage: There is some small chondral irregularities along the capitellum for example on image 7 of series 8.  Joint:  Large joint effusion.  Cubital tunnel: Low-level edema in the cubital tunnel meters the generalized edema in the region. No impingement or expansion of the ulnar nerve. No visible entrapment of the ulnar nerve in the fracture.  Bones: There is an oblique fracture extending from the medial humeral epicondyle to the trochlear groove, as shown on image 9 of series 7, with extensive surrounding marrow edema in the distal humerus. The age of the fracture is uncertain; there some elements which raise the possibility of cortication along the fracture margins although on the axial T1 weighted images the fracture plane margins are indistinct.  IMPRESSION: 1. Distal humeral fracture extends from the medial epicondyle/metaphysis obliquely to the trochlear groove distal articular surface, with abnormal osseous edema, surrounding soft tissue edema, and a large elbow joint effusion. The regional edema tracks in various muscle groups as detailed above. Age of the fractures difficult to ascertain, as some images suggest some mild cortication along the fracture margins but other images such as the T1 axial images do not confirm. Conventional radiographic correlation and correlation with patient history may be helpful in this regard. 2. Indistinct and potentially torn lateral ulnar collateral ligament along its proximal attachment. 3. Small chondral defects along the capitellum.   Electronically Signed   By: Gaylyn Rong M.D.   On: 04/26/2015 08:20   Dg C-arm 1-60 Min  05/09/2015    CLINICAL DATA:  ORIF left elbow  EXAM: DG C-ARM 61-120 MIN; LEFT ELBOW - 2 VIEW  TECHNIQUE: Fluoroscopic spot views  FLUOROSCOPY TIME:  If the device does not provide the exposure index:  Fluoroscopy Time (in minutes and seconds):  0 minutes 23 seconds  Number of Acquired Images:  2  COMPARISON:  04/25/2015  FINDINGS: There is a compression plate over the medial cortex of distal humeral shaft and condyle.Anatomic position and alignment of fracture fragments.  IMPRESSION: Acceptable postoperative appearance.   Electronically Signed   By: Esperanza Heir M.D.   On: 05/09/2015 14:00    Disposition: 03-Skilled Nursing Facility      Discharge Instructions    Call MD / Call 911    Complete by:  As directed   If you experience chest pain or shortness of breath, CALL 911 and be transported to the hospital emergency room.  If you develope a fever above 101.5 F, pus (white drainage) or increased drainage or redness at the wound, or calf pain, call your surgeon's office.     Constipation Prevention    Complete by:  As directed   Drink plenty of fluids.  Prune juice may be helpful.  You may use a stool softener, such as Colace (over the counter) 100 mg twice a day.  Use MiraLax (over the counter) for constipation as needed.     Diet - low sodium heart healthy    Complete by:  As directed      Diet general    Complete by:  As directed      Driving restrictions    Complete by:  As directed   No driving while taking narcotic pain meds.     Increase activity slowly as tolerated    Complete by:  As directed            Follow-up Information    Follow up with Cheral Almas, MD In 2 weeks.   Specialty:  Orthopedic Surgery   Why:  For suture removal, For wound re-check   Contact information:   [address] [place]  [place] [address] [phone]        Signed: Cheral Almas 05/11/2015, 7:06 AM

## 2015-05-11 NOTE — Clinical Social Work Note (Signed)
Clinical Social Work Assessment  Patient Details  Name: Lauren Avery MRN: [id number] Date of Birth: 05-30-1927  Date of referral:  05/11/15               Reason for consult:  Discharge Planning, Facility Placement                Permission sought to share information with:  Family Supports, Chartered certified accountant granted to share information::  Yes, Verbal Permission Granted  Name::     [name]  Agency::  [facility]  Relationship::  Daughters  Contact Information:  n/a  Housing/Transportation Living arrangements for the past 2 months:  [place] of Information:  Patient, Adult Children Patient Interpreter Needed:  None Criminal Activity/Legal Involvement Pertinent to Current Situation/Hospitalization:  No - Comment as needed Significant Relationships:  Adult Children Lives with:  Self Do you feel safe going back to the place where you live?  No (High fall risk) Need for family participation in patient care:  Yes (Comment) (Patient's two daughters active in care.)  Care giving concerns:  Patient nor patient's daughter expressed any concerns at this time.   Social Worker assessment / plan:  CSW received referral for possible SNF placement. CSW met with patient and patient's daughter at bedside to discuss discharge disposition. Patient expressed preference for [facility], but CSW informed patient that [facility] currently [facility] no bed availability. Patient and patient's daughter understanding and agreeable to [facility]. CSW to continue to follow and coordinate discharge once medically stable.  Employment status:  Retired Nurse, adult PT Recommendations:  [place] / Referral to community resources:  [place]  Patient/Family's Response to care:  Patient understanding and agreeable to CSW plan of care.  Patient/Family's Understanding of and  Emotional Response to Diagnosis, Current Treatment, and Prognosis:  Patient understanding and agreeable to CSW plan of care.  Emotional Assessment Appearance:  Appears stated age Attitude/Demeanor/Rapport:  Other (Pleasant) Affect (typically observed):  Accepting, Appropriate, Pleasant, Happy Orientation:  Oriented to Self, Oriented to Place, Oriented to  Time, Oriented to Situation Alcohol / Substance use:  Not Applicable Psych involvement (Current and /or in the community):  No (Comment) (Not appropriate on this admission.)  Discharge Needs  Concerns to be addressed:  No discharge needs identified Readmission within the last 30 days:  No Current discharge risk:  None Barriers to Discharge:  No Barriers Identified   [name], LCSW 05/11/2015, 11:45 AM [phone]

## 2015-05-11 NOTE — Progress Notes (Signed)
   Subjective:  Patient reports pain is mild.  Objective:   VITALS:   Filed Vitals:   05/10/15 0710 05/10/15 1518 05/10/15 1955 05/11/15 0459  BP: 116/47 122/67 113/52 125/49  Pulse:  73 98 93  Temp: 98.3 F (36.8 C) 97.7 F (36.5 C) 98.3 F (36.8 C) 98.4 F (36.9 C)  TempSrc:   Oral Oral  Resp:  18 17 18   Height:      Weight:      SpO2:  95% 92% 90%    Neurologically intact Neurovascular intact Sensation intact distally Intact pulses distally Incision: dressing C/D/I and no drainage No cellulitis present Compartment soft  Ulnar nerve function intact   Lab Results  Component Value Date   WBC 7.9 05/10/2015   HGB 10.7* 05/10/2015   HCT 33.0* 05/10/2015   MCV 89.2 05/10/2015   PLT 92* 05/10/2015     Assessment/Plan:  2 Days Post-Op   - patient prefers [facility] - stable from ortho for dc today - up with PT/OT again today - Rx in chart  Cheral Almas 05/11/2015, 7:04 AM [phone]

## 2015-05-11 NOTE — Telephone Encounter (Signed)
[facility] 

## 2015-05-11 NOTE — Discharge Planning (Signed)
Patient to be discharged to [facility]. Patient and patient's daughter updated at bedside.  Facility: [facility] RN report number: [phone] Transportation: EMS ([address]  Marcelline Deist, [place] - [phone] Clinical Social Work Department Orthopedics [phone]) and Surgical [phone])

## 2015-05-14 ENCOUNTER — Non-Acute Institutional Stay (SKILLED_NURSING_FACILITY): Payer: Medicare Other | Admitting: Internal Medicine

## 2015-05-14 ENCOUNTER — Encounter: Payer: Self-pay | Admitting: Internal Medicine

## 2015-05-14 DIAGNOSIS — D62 Acute posthemorrhagic anemia: Secondary | ICD-10-CM

## 2015-05-14 DIAGNOSIS — K5900 Constipation, unspecified: Secondary | ICD-10-CM | POA: Diagnosis not present

## 2015-05-14 DIAGNOSIS — N289 Disorder of kidney and ureter, unspecified: Secondary | ICD-10-CM

## 2015-05-14 DIAGNOSIS — S42462S Displaced fracture of medial condyle of left humerus, sequela: Secondary | ICD-10-CM | POA: Diagnosis not present

## 2015-05-14 DIAGNOSIS — I10 Essential (primary) hypertension: Secondary | ICD-10-CM

## 2015-05-14 DIAGNOSIS — E119 Type 2 diabetes mellitus without complications: Secondary | ICD-10-CM | POA: Diagnosis not present

## 2015-05-14 DIAGNOSIS — K219 Gastro-esophageal reflux disease without esophagitis: Secondary | ICD-10-CM

## 2015-05-14 DIAGNOSIS — M810 Age-related osteoporosis without current pathological fracture: Secondary | ICD-10-CM

## 2015-05-14 NOTE — Progress Notes (Signed)
Patient ID: Lauren Avery, female   DOB: [birth year], 81 y.o.   MRN: [id number]     [facility]  PCP: Gaspar Garbe, MD  Code Status: Full Code   Allergies  Allergen Reactions  . Hydrochlorothiazide Other (See Comments)    Taken from faxed notes of dr Wylene Simmer (to short stay)  THROMBOCYTOPENIA  . Prilosec [Omeprazole] Diarrhea    Chief Complaint  Patient presents with  . New Admit To SNF    New Admisssion      HPI:  81 year old patient is here for short term rehabilitation post hospital admission from 05/09/15-05/11/15 with fracture of left humerus medial condyle. She underwent open reduction and internal fixation of medial condyle fracture of the humerus on 05/09/15. She is seen in her room today. She denies any concerns. Her pain is currently under control. She has PMH of osteoporosis, GERD, HTN, DM.  Review of Systems:  Constitutional: Negative for fever, chills, malaise/fatigue and diaphoresis.  HENT: Negative for headache, congestion, nasal discharge Eyes: Negative for eye pain, blurred vision, double vision and discharge.  Respiratory: Negative for cough, shortness of breath and wheezing.   Cardiovascular: Negative for chest pain, palpitations, leg swelling.  Gastrointestinal: Negative for heartburn, nausea, vomiting, abdominal pain. Bowel movement this am Genitourinary: Negative for dysuria, urgency, frequency Musculoskeletal: Negative for back pain, falls Skin: Negative for itching, rash.  Neurological: Negative for dizziness, tingling, focal weakness Psychiatric/Behavioral: Negative for depression.    Past Medical History  Diagnosis Date  . Osteoporosis   . Pulmonary fibrosis   . GERD (gastroesophageal reflux disease)   . Fracture of left clavicle     12/12 wore a sling  . Hypertension   . Diabetes mellitus   . Fracture 10/2012    left wrist   Past Surgical History  Procedure Laterality Date  . Hip fracture surgery      1984 pin and  plate placed right  . Hip surgery      pin and plate removed 03/980  . Femur fracture surgery       6/08 partial hip replacement right  . Ganglion cyst removal      left wrist 1960  . Orif elbow fracture Right 02/18/2013    Procedure: OPEN REDUCTION INTERNAL FIXATION (ORIF) ELBOW/OLECRANON FRACTURE;  Surgeon: Nadara Mustard, MD;  Location: [facility] OR;  Service: Orthopedics;  Laterality: Right;  . Orif elbow fracture Left 05/09/2015    Procedure: OPEN REDUCTION INTERNAL FIXATION (ORIF) LEFT MEDIAL HUMERAL CONDYLE FRACTURE;  Surgeon: Tarry Kos, MD;  Location: [facility] OR;  Service: Orthopedics;  Laterality: Left;   Social History:   reports that she has never smoked. She has never used smokeless tobacco. She reports that she does not drink alcohol or use illicit drugs.  Family History  Problem Relation Age of Onset  . Pancreatic cancer Brother   . Stroke Mother   . Diabetes Father     Medications: Patient's Medications  New Prescriptions   No medications on file  Previous Medications   ACETAMINOPHEN (TYLENOL) 325 MG TABLET    Take 2 tablets (650 mg total) by mouth every 6 (six) hours as needed.   AMLODIPINE (NORVASC) 5 MG TABLET    Take 5 mg by mouth every other day.    ASPIRIN EC 325 MG TABLET    Take 1 tablet (325 mg total) by mouth daily.   CALCIUM CARBONATE (TUMS - DOSED IN MG ELEMENTAL CALCIUM) 500 MG CHEWABLE TABLET  Chew 1 tablet by mouth 2 (two) times daily with a meal.   CETIRIZINE (ZYRTEC) 10 MG TABLET    Take 1 tablet (10 mg total) by mouth daily.   HYDROCODONE-ACETAMINOPHEN (NORCO) 7.5-325 MG PER TABLET    Take 1-2 tablets by mouth every 6 (six) hours as needed for moderate pain.   HYDROCODONE-HOMATROPINE (HYCODAN) 5-1.5 MG/5ML SYRUP    Take 5 mLs by mouth at bedtime as needed for cough.   LOSARTAN (COZAAR) 50 MG TABLET    Take 50 mg by mouth daily.   MULTIPLE VITAMINS-MINERALS (MULTIVITAMIN WITH MINERALS) TABLET    Take 1 tablet by mouth daily.   PANTOPRAZOLE (PROTONIX) 40 MG  TABLET    Take 1 tablet (40 mg total) by mouth 2 (two) times daily.   SENNA-DOCUSATE (SENOKOT S) 8.6-50 MG PER TABLET    Take 1 tablet by mouth at bedtime as needed.   TRAMADOL (ULTRAM) 50 MG TABLET    Take 1 tablet (50 mg total) by mouth every 6 (six) hours as needed (cough).   ZOLEDRONIC ACID (RECLAST IV)    Inject into the vein. yearly  Modified Medications   Modified Medication Previous Medication   CALCIUM CITRATE-VITAMIN D (CITRACAL+D) 315-200 MG-UNIT PER TABLET calcium citrate-vitamin D (CITRACAL+D) 315-200 MG-UNIT per tablet      Take 1 tablet by mouth 2 (two) times daily.    Take 1 tablet by mouth 2 (two) times daily.   GLIMEPIRIDE (AMARYL) 2 MG TABLET glimepiride (AMARYL) 2 MG tablet      Take 1 tablet (2 mg total) by mouth daily before breakfast.    Take 1 tablet (2 mg total) by mouth daily before breakfast.  Discontinued Medications   IBUPROFEN (ADVIL,MOTRIN) 200 MG TABLET    Take 200 mg by mouth every 6 (six) hours as needed for fever, headache or mild pain.     Physical Exam: Filed Vitals:   05/14/15 1030  BP: 102/57  Pulse: 87  Temp: 97.4 F (36.3 C)  TempSrc: Oral  Height:  (1.575 m)  Weight: 118 lb (53.524 kg)  SpO2: 93%    General- elderly female, in no acute distress Head- normocephalic, atraumatic Throat- moist mucus membrane Eyes- PERRLA, EOMI, no pallor, no icterus, no discharge, normal conjunctiva, normal sclera Neck- no cervical lymphadenopathy Cardiovascular- normal s1,s2, no murmurs, palpable dorsalis pedis and radial pulses, no leg edema Respiratory- bilateral clear to auscultation, no wheeze, no rhonchi, no crackles, no use of accessory muscles Abdomen- bowel sounds present, soft, non tender Musculoskeletal- able to move all 4 extremities, left arm in sling and a cast present in left arm, can move her digits, good capillary refill  Neurological- no focal deficit, normal reflexes, normal muscle strength, normal sensation to fine touch and  vibration Skin- warm and dry Psychiatry- alert and oriented to person, place and time, normal mood and affect    Labs reviewed: Basic Metabolic Panel:  Recent Labs  16/10/96 1105 05/10/15 0435  NA 138 137  K 4.9 4.4  CL 106 103  CO2 19* 26  GLUCOSE 143* 143*  BUN 21* 15  CREATININE 1.12* 1.02*  CALCIUM 9.8 8.5*   Liver Function Tests: No results for input(s): AST, ALT, ALKPHOS, BILITOT, PROT, ALBUMIN in the last 8760 hours. No results for input(s): LIPASE, AMYLASE in the last 8760 hours. No results for input(s): AMMONIA in the last 8760 hours. CBC:  Recent Labs  05/09/15 1105 05/10/15 0435  WBC 8.3 7.9  NEUTROABS  --  4.9  HGB 12.4 10.7*  HCT 36.7 33.0*  MCV 87.6 89.2  PLT 114* 92*   Cardiac Enzymes: No results for input(s): CKTOTAL, CKMB, CKMBINDEX, TROPONINI in the last 8760 hours. BNP: Invalid input(s): POCBNP CBG:  Recent Labs  05/10/15 2255 05/11/15 0638 05/11/15 1301  GLUCAP 157* 166* 149*    Radiological Exams:  04/26/15 Mri elbow IMPRESSION: 1. Distal humeral fracture extends from the medial epicondyle/metaphysis obliquely to the trochlear groove distal articular surface, with abnormal osseous edema, surrounding soft tissue edema, and a large elbow joint effusion. The regional edema tracks in various muscle groups as detailed above. Age of the fractures difficult to ascertain, as some images suggest some mild cortication along the fracture margins but other images such as the T1 axial images do not confirm. Conventional radiographic correlation and correlation with patient history may be helpful in this regard. 2. Indistinct and potentially torn lateral ulnar collateral ligament along its proximal attachment. 3. Small chondral defects along the capitellum.     05/09/15 Elbow xray 2 view FINDINGS: There is a compression plate over the medial cortex of distal humeral shaft and condyle.Anatomic position and alignment of fracture  fragments.   IMPRESSION: Acceptable postoperative appearance.      Assessment/Plan  Left medial condyle fracture S/p ORIF. Has follow up with orthopedics. Left arm to be in sling with cast in place. Will have patient work with PT/OT as tolerated to regain strength and restore function.  Fall precautions are in place. Continue ca-vit d supplement. Continue tramadol 50 mg q6h prn for mild to moderate pain and norco 7.5-325 mg 1-2 tab q6h for severe pain.   Osteoporosis On reclast once a year, continue calcium-vitamin d supplement. Fall precautions  Impaired renal function Last cr 1.02. Monitor renal function  Acute blood loss anemia likely post op. Also on aspirin. Last hb 10.7. Check h&h  Dm type 2 Monitor cbg, continue amaryl 2 mg daily and aspirin and cozaar  HTN Reviewed bp readings, on lower side of normal. Currently on cozaar 50 mg daily and norvasc 5 mg daily. Hold off on norvasc, check vitals q shift and reintroduce it if needed  gerd Stable, continue protonix 40 mg bid  Constipation Stable, continue prune juice and senokot s qhs prn   Goals of care: short term rehabilitation    Labs/tests ordered: cbc, bmp  Family/ staff Communication: reviewed care plan with patient and nursing supervisor    Oneal Grout, MD  [facility] [phone] (Monday-Friday 8 am - 5 pm) [phone] (afterhours)

## 2015-05-16 LAB — BASIC METABOLIC PANEL
BUN: 28 mg/dL — AB (ref 4–21)
Creatinine: 1.2 mg/dL — AB (ref 0.5–1.1)
Glucose: 136 mg/dL
Potassium: 4.3 mmol/L (ref 3.4–5.3)
Sodium: 138 mmol/L (ref 137–147)

## 2015-05-16 LAB — CBC AND DIFFERENTIAL
HCT: 36 % (ref 36–46)
Hemoglobin: 12.3 g/dL (ref 12.0–16.0)
Platelets: 180 10*3/uL (ref 150–399)
WBC: 8.7 10^3/mL

## 2015-05-18 ENCOUNTER — Other Ambulatory Visit: Payer: Self-pay | Admitting: *Deleted

## 2015-05-18 MED ORDER — HYDROCODONE-HOMATROPINE 5-1.5 MG/5ML PO SYRP: 5.0000 mL | ORAL_SOLUTION | Freq: Every evening | ORAL | Status: DC | PRN

## 2015-05-18 NOTE — Telephone Encounter (Signed)
[facility] 

## 2015-05-21 ENCOUNTER — Encounter (HOSPITAL_COMMUNITY): Payer: Self-pay | Admitting: Orthopaedic Surgery

## 2015-06-06 ENCOUNTER — Encounter: Payer: Self-pay | Admitting: Nurse Practitioner

## 2015-06-06 ENCOUNTER — Non-Acute Institutional Stay (SKILLED_NURSING_FACILITY): Payer: Medicare Other | Admitting: Nurse Practitioner

## 2015-06-06 DIAGNOSIS — S42462S Displaced fracture of medial condyle of left humerus, sequela: Secondary | ICD-10-CM | POA: Diagnosis not present

## 2015-06-06 DIAGNOSIS — D62 Acute posthemorrhagic anemia: Secondary | ICD-10-CM

## 2015-06-06 DIAGNOSIS — E119 Type 2 diabetes mellitus without complications: Secondary | ICD-10-CM | POA: Diagnosis not present

## 2015-06-06 DIAGNOSIS — M810 Age-related osteoporosis without current pathological fracture: Secondary | ICD-10-CM | POA: Diagnosis not present

## 2015-06-06 DIAGNOSIS — N289 Disorder of kidney and ureter, unspecified: Secondary | ICD-10-CM

## 2015-06-06 DIAGNOSIS — K5900 Constipation, unspecified: Secondary | ICD-10-CM

## 2015-06-06 DIAGNOSIS — R5381 Other malaise: Secondary | ICD-10-CM | POA: Diagnosis not present

## 2015-06-06 DIAGNOSIS — I10 Essential (primary) hypertension: Secondary | ICD-10-CM | POA: Diagnosis not present

## 2015-06-06 NOTE — Progress Notes (Signed)
Patient ID: Lauren Avery, female   DOB: Apr 06, 1927, 81 y.o.   MRN: [id number]    Nursing Home Location:  [facility]   Place of Service: SNF (31)  PCP: Gaspar Garbe, MD  Allergies  Allergen Reactions  . Hydrochlorothiazide Other (See Comments)    Taken from faxed notes of dr Wylene Simmer (to short stay)  THROMBOCYTOPENIA  . Prilosec [Omeprazole] Diarrhea    Chief Complaint  Patient presents with  . Discharge Note    Discharge from SNF    HPI:  Patient is a 81 y.o. female seen today at [facility] for discharge to [facility]. Pt with a PMH of osteoporosis, GERD, HTN, DM. here for short term rehabilitation post hospital admission from 05/09/15-05/11/15 with fracture of left humerus medial condyle. She underwent open reduction and internal fixation of medial condyle fracture of the humerus on 05/09/15. Pain is well managed and currently doing well with therapy, now stable to discharge with home health.  Review of Systems:  Review of Systems  Constitutional: Negative for activity change, appetite change, fatigue and unexpected weight change.  HENT: Negative for congestion and hearing loss.   Eyes: Negative.   Respiratory: Negative for cough and shortness of breath.   Cardiovascular: Negative for chest pain, palpitations and leg swelling.  Gastrointestinal: Negative for abdominal pain, diarrhea and constipation.  Genitourinary: Negative for dysuria and difficulty urinating.  Musculoskeletal: Negative for myalgias and arthralgias.       Decreased ROM to left elbow, pain managed with current medication  Skin: Negative for color change and wound.  Neurological: Negative for dizziness and weakness.  Psychiatric/Behavioral: Negative for behavioral problems, confusion and agitation.    Past Medical History  Diagnosis Date  . Osteoporosis   . Pulmonary fibrosis   . GERD (gastroesophageal reflux disease)   . Fracture of left clavicle     12/12 wore a  sling  . Hypertension   . Diabetes mellitus   . Fracture 10/2012    left wrist   Past Surgical History  Procedure Laterality Date  . Hip fracture surgery      1984 pin and plate placed right  . Hip surgery      pin and plate removed 03/980  . Femur fracture surgery       6/08 partial hip replacement right  . Ganglion cyst removal      left wrist 1960  . Orif elbow fracture Right 02/18/2013    Procedure: OPEN REDUCTION INTERNAL FIXATION (ORIF) ELBOW/OLECRANON FRACTURE;  Surgeon: Nadara Mustard, MD;  Location: [facility] OR;  Service: Orthopedics;  Laterality: Right;  . Orif elbow fracture Left 05/09/2015    Procedure: OPEN REDUCTION INTERNAL FIXATION (ORIF) LEFT MEDIAL HUMERAL CONDYLE FRACTURE;  Surgeon: Tarry Kos, MD;  Location: [facility] OR;  Service: Orthopedics;  Laterality: Left;   Social History:   reports that she has never smoked. She has never used smokeless tobacco. She reports that she does not drink alcohol or use illicit drugs.  Family History  Problem Relation Age of Onset  . Pancreatic cancer Brother   . Stroke Mother   . Diabetes Father     Medications: Patient's Medications  New Prescriptions   No medications on file  Previous Medications   ACETAMINOPHEN (TYLENOL) 325 MG TABLET    Take 2 tablets (650 mg total) by mouth every 6 (six) hours as needed.   ASPIRIN EC 325 MG TABLET    Take 1 tablet (325 mg total)  by mouth daily.   CALCIUM CARBONATE (TUMS - DOSED IN MG ELEMENTAL CALCIUM) 500 MG CHEWABLE TABLET    Chew 1 tablet by mouth 2 (two) times daily with a meal.   CETIRIZINE (ZYRTEC) 10 MG TABLET    Take 1 tablet (10 mg total) by mouth daily.   GLIMEPIRIDE (AMARYL) 2 MG TABLET    Take 1 tablet (2 mg total) by mouth daily before breakfast.   HYDROCODONE-ACETAMINOPHEN (NORCO) 7.5-325 MG PER TABLET    Take 1-2 tablets by mouth every 6 (six) hours as needed for moderate pain.   HYDROCODONE-HOMATROPINE (HYCODAN) 5-1.5 MG/5ML SYRUP    Take 5 mLs by mouth at bedtime as needed for  cough.   LOSARTAN (COZAAR) 50 MG TABLET    Take 50 mg by mouth daily.   MULTIPLE VITAMINS-MINERALS (MULTIVITAMIN WITH MINERALS) TABLET    Take 1 tablet by mouth daily.   PANTOPRAZOLE (PROTONIX) 40 MG TABLET    Take 1 tablet (40 mg total) by mouth 2 (two) times daily.   SENNA-DOCUSATE (SENOKOT S) 8.6-50 MG PER TABLET    Take 1 tablet by mouth at bedtime as needed.   TRAMADOL (ULTRAM) 50 MG TABLET    Take 1 tablet (50 mg total) by mouth every 6 (six) hours as needed (cough).   ZOLEDRONIC ACID (RECLAST IV)    Inject into the vein. yearly  Modified Medications   No medications on file  Discontinued Medications   AMLODIPINE (NORVASC) 5 MG TABLET    Take 5 mg by mouth every other day.    CALCIUM CITRATE-VITAMIN D (CITRACAL+D) 315-200 MG-UNIT PER TABLET    Take 1 tablet by mouth 2 (two) times daily.     Physical Exam: Filed Vitals:   06/06/15 0932  BP: 118/61  Pulse: 83  Temp: 97.4 F (36.3 C)  TempSrc: Oral  Resp: 16  Height: 5\' 2"  (1.575 m)  Weight: 123 lb (55.792 kg)    Physical Exam  Constitutional: She is oriented to person, place, and time. She appears well-developed and well-nourished. No distress.  HENT:  Head: Normocephalic and atraumatic.  Mouth/Throat: Oropharynx is clear and moist. No oropharyngeal exudate.  Eyes: Conjunctivae are normal. Pupils are equal, round, and reactive to light.  Neck: Normal range of motion. Neck supple.  Cardiovascular: Normal rate, regular rhythm and normal heart sounds.   Pulmonary/Chest: Effort normal and breath sounds normal.  Abdominal: Soft. Bowel sounds are normal.  Musculoskeletal: She exhibits no edema or tenderness.  Limited ROM to left elbow, incision healing well  Neurological: She is alert and oriented to person, place, and time.  Skin: Skin is warm and dry. She is not diaphoretic.  Psychiatric: She has a normal mood and affect.    Labs reviewed: Basic Metabolic Panel:  Recent Labs  16/08/9605/08/16 1105 05/10/15 0435 05/16/15    NA 138 137 138  K 4.9 4.4 4.3  CL 106 103  --   CO2 19* 26  --   GLUCOSE 143* 143*  --   BUN 21* 15 28*  CREATININE 1.12* 1.02* 1.2*  CALCIUM 9.8 8.5*  --    Liver Function Tests: No results for input(s): AST, ALT, ALKPHOS, BILITOT, PROT, ALBUMIN in the last 8760 hours. No results for input(s): LIPASE, AMYLASE in the last 8760 hours. No results for input(s): AMMONIA in the last 8760 hours. CBC:  Recent Labs  05/09/15 1105 05/10/15 0435 05/16/15  WBC 8.3 7.9 8.7  NEUTROABS  --  4.9  --   HGB 12.4  10.7* 12.3  HCT 36.7 33.0* 36  MCV 87.6 89.2  --   PLT 114* 92* 180   TSH: No results for input(s): TSH in the last 8760 hours. A1C: Lab Results  Component Value Date   HGBA1C 7.6* 05/09/2015   Lipid Panel: No results for input(s): CHOL, HDL, LDLCALC, TRIG, CHOLHDL, LDLDIRECT in the last 8760 hours.   Assessment/Plan 1. Fracture of elbow, medial condyle, left, closed, sequela S/p ORIF.  Ongoing follow up with orthopedics.  conts with Left arm to be in sling.  Continue ca-vit d supplement.  Continue tramadol 50 mg q6h prn for mild to moderate pain and norco 7.5-325 mg 1-2 tab q6h for severe pain.   2. Essential hypertension -was taken off of norvasc due to blood pressure being on the low side, blood pressure remains stable at this time, conts on cozaar daily  3. Constipation, unspecified constipation type Well controlled at this time  4. Type 2 diabetes mellitus without complication -conts on Amaryl 2 mg daily   5. Osteoporosis conts on reclast once a year, continue calcium-vitamin d supplement.  6. Anemia -hgb stable, will need ongoing followup as outpatient   7. Impaired renal function Last Cr slightly elevated at 1.2, encouraged hydration, will need to be monitored as outpatient   8. Physical deconditioning Has improved. pt is stable for discharge to AL-will need PT/OT per home health. DME needed includes standard wheelchair to maintain function. Rx written.   will need to follow up with PCP within 2 weeks.    Janene Harvey. Biagio Borg  [facility] [phone] 8 am - 5 pm) [phone] (after hours)

## 2015-07-06 ENCOUNTER — Ambulatory Visit (INDEPENDENT_AMBULATORY_CARE_PROVIDER_SITE_OTHER): Payer: Medicare Other

## 2015-07-06 ENCOUNTER — Ambulatory Visit (INDEPENDENT_AMBULATORY_CARE_PROVIDER_SITE_OTHER): Payer: Medicare Other | Admitting: Podiatry

## 2015-07-06 ENCOUNTER — Encounter: Payer: Self-pay | Admitting: Podiatry

## 2015-07-06 VITALS — BP 121/65 | HR 92 | Resp 16

## 2015-07-06 DIAGNOSIS — E119 Type 2 diabetes mellitus without complications: Secondary | ICD-10-CM

## 2015-07-06 DIAGNOSIS — E1142 Type 2 diabetes mellitus with diabetic polyneuropathy: Secondary | ICD-10-CM

## 2015-07-06 DIAGNOSIS — Z0189 Encounter for other specified special examinations: Secondary | ICD-10-CM

## 2015-07-06 DIAGNOSIS — M79673 Pain in unspecified foot: Secondary | ICD-10-CM | POA: Diagnosis not present

## 2015-07-06 DIAGNOSIS — B351 Tinea unguium: Secondary | ICD-10-CM | POA: Diagnosis not present

## 2015-07-06 NOTE — Patient Instructions (Signed)
Diabetes and Foot Care Diabetes may cause you to have problems because of poor blood supply (circulation) to your feet and legs. This may cause the skin on your feet to become thinner, break easier, and heal more slowly. Your skin may become dry, and the skin may peel and crack. You may also have nerve damage in your legs and feet causing decreased feeling in them. You may not notice minor injuries to your feet that could lead to infections or more serious problems. Taking care of your feet is one of the most important things you can do for yourself.  HOME CARE INSTRUCTIONS  Wear shoes at all times, even in the house. Do not go barefoot. Bare feet are easily injured.  Check your feet daily for blisters, cuts, and redness. If you cannot see the bottom of your feet, use a mirror or ask someone for help.  Wash your feet with warm water (do not use hot water) and mild soap. Then pat your feet and the areas between your toes until they are completely dry. Do not soak your feet as this can dry your skin.  Apply a moisturizing lotion or petroleum jelly (that does not contain alcohol and is unscented) to the skin on your feet and to dry, brittle toenails. Do not apply lotion between your toes.  Trim your toenails straight across. Do not dig under them or around the cuticle. File the edges of your nails with an emery board or nail file.  Do not cut corns or calluses or try to remove them with medicine.  Wear clean socks or stockings every day. Make sure they are not too tight. Do not wear knee-high stockings since they may decrease blood flow to your legs.  Wear shoes that fit properly and have enough cushioning. To break in new shoes, wear them for just a few hours a day. This prevents you from injuring your feet. Always look in your shoes before you put them on to be sure there are no objects inside.  Do not cross your legs. This may decrease the blood flow to your feet.  If you find a minor scrape,  cut, or break in the skin on your feet, keep it and the skin around it clean and dry. These areas may be cleansed with mild soap and water. Do not cleanse the area with peroxide, alcohol, or iodine.  When you remove an adhesive bandage, be sure not to damage the skin around it.  If you have a wound, look at it several times a day to make sure it is healing.  Do not use heating pads or hot water bottles. They may burn your skin. If you have lost feeling in your feet or legs, you may not know it is happening until it is too late.  Make sure your health care provider performs a complete foot exam at least annually or more often if you have foot problems. Report any cuts, sores, or bruises to your health care provider immediately. SEEK MEDICAL CARE IF:   You have an injury that is not healing.  You have cuts or breaks in the skin.  You have an ingrown nail.  You notice redness on your legs or feet.  You feel burning or tingling in your legs or feet.  You have pain or cramps in your legs and feet.  Your legs or feet are numb.  Your feet always feel cold. SEEK IMMEDIATE MEDICAL CARE IF:   There is increasing redness,   swelling, or pain in or around a wound.  There is a red line that goes up your leg.  Pus is coming from a wound.  You develop a fever or as directed by your health care provider.  You notice a bad smell coming from an ulcer or wound. Document Released: 11/14/2000 Document Revised: 07/20/2013 Document Reviewed: 04/26/2013 Exit[employer] Patient Information 2015 [employer]. This information is not intended to replace advice given to you by your health care provider. Make sure you discuss any questions you have with your health care provider.  

## 2015-07-06 NOTE — Progress Notes (Signed)
   Subjective:    Patient ID: Lauren Avery, female    DOB: [birth year], 81 y.o.   MRN: [id number]  HPI Comments: "I have a fungus"  Patient c/o thick, discolored toenails bilateral feet, especially the 1st toenail left, for several years. She injured this nail in the past and now it keeps falling off. She has tried antifungal creams/liquids and soaking-no help.  Diabetic "many years" and last Alc was 7.0     Review of Systems  Musculoskeletal: Positive for arthralgias.  All other systems reviewed and are negative.      Objective:   Physical Exam : I have reviewed her past medical history medications allergies surgery social history review of systems and chief complaint. Pulses are palpable bilateral. Slightly decreased sensorium per Semmes-Weinstein monofilament toes to mid foot bilateral. Tendon reflexes brisk and equal bilateral. Muscle strength normal bilateral. Presents in a wheelchair but can ambulate. Orthopedic evaluation demonstrates all joints distal to the ankle, full range of motion mild flexible hammertoe deformities are noted bilateral. Toenails are thick yellow dystrophic with mycotic painful palpation as well as debridement.        Assessment & Plan:  Assessment: Diabetic peripheral neuropathy with pain in limb secondary to onychomycosis.  Plan: Debridement of nails 1 through 5 bilateral.

## 2015-07-30 ENCOUNTER — Ambulatory Visit: Payer: Medicare Other | Admitting: Podiatry

## 2015-09-20 ENCOUNTER — Ambulatory Visit (INDEPENDENT_AMBULATORY_CARE_PROVIDER_SITE_OTHER): Payer: Medicare Other | Admitting: Pulmonary Disease

## 2015-09-20 ENCOUNTER — Encounter: Payer: Self-pay | Admitting: Pulmonary Disease

## 2015-09-20 ENCOUNTER — Ambulatory Visit (INDEPENDENT_AMBULATORY_CARE_PROVIDER_SITE_OTHER)
Admission: RE | Admit: 2015-09-20 | Discharge: 2015-09-20 | Disposition: A | Discharge status: Home / Self Care | Payer: Medicare Other | Source: Ambulatory Visit | Attending: Pulmonary Disease | Admitting: Pulmonary Disease

## 2015-09-20 VITALS — BP 116/74 | HR 95 | Temp 97.9°F | Wt 124.2 lb

## 2015-09-20 DIAGNOSIS — K2100 Gastro-esophageal reflux disease with esophagitis, without bleeding: Secondary | ICD-10-CM

## 2015-09-20 DIAGNOSIS — R05 Cough: Secondary | ICD-10-CM | POA: Diagnosis not present

## 2015-09-20 DIAGNOSIS — J84112 Idiopathic pulmonary fibrosis: Secondary | ICD-10-CM

## 2015-09-20 DIAGNOSIS — R053 Chronic cough: Secondary | ICD-10-CM

## 2015-09-20 DIAGNOSIS — J387 Other diseases of larynx: Secondary | ICD-10-CM

## 2015-09-20 DIAGNOSIS — K219 Gastro-esophageal reflux disease without esophagitis: Secondary | ICD-10-CM | POA: Insufficient documentation

## 2015-09-20 MED ORDER — PANTOPRAZOLE SODIUM 40 MG PO TBEC: 40.0000 mg | DELAYED_RELEASE_TABLET | Freq: Two times a day (BID) | ORAL | Status: DC

## 2015-09-20 MED ORDER — HYDROCODONE-HOMATROPINE 5-1.5 MG/5ML PO SYRP: 5.0000 mL | ORAL_SOLUTION | ORAL | Status: DC | PRN

## 2015-09-20 MED ORDER — TRAMADOL HCL 50 MG PO TABS: ORAL_TABLET | ORAL | Status: DC

## 2015-09-20 NOTE — Progress Notes (Signed)
Subjective:     Patient ID: Lauren Avery, female   DOB: [birth year], 81 y.o.   MRN: [id number]  HPI ~  March 16, 2014:  31yr ROV w/ KC>        The patient comes in today for followup of her known idiopathic pulmonary fibrosis and chronic cough that is felt secondary to upper and lower airway issues. The patient was last seen 2 years ago, and she never followed up with her chest x-ray and pulmonary function studies because of other medical issues that required hospitalizations. She comes in today where her cough continues to be a significant problem, and she feels that her exertional tolerance has declined as well. She is no longer taking her proton pump inhibitor, and is having ongoing reflux symptoms. She is also having a lot of rhinorrhea, and is not taking her antihistamine on a regular basis.      REC>  1) Chronic cough:  The pt continues to have dry cough that I suspect is both upper and lower airway in origin. She is having ongoing reflux and in is no longer taking her PPI, and also is having rhinorrhea. Chronic dry cough is also a hallmark of IPF. I have also stressed to her the importance of reflux treatment in patients with interstitial lung disease, and how it can actually cause progressive disease . It has become standard of care to treat all patients with IPF with a proton pump inhibitor empirically. Will also try cough suppression both during the day and night.  2) IPF:  The patient has idiopathic pulmonary fibrosis, and she has not had recent chest x-ray or PFTs because of other medical issues that needed to be taken care of. She does feel that her breathing is a little worse now than in the past, and I will schedule her for a followup chest x-ray and pulmonary function studies. I do not think she is a great candidate for anti-fibrotic therapy.  ~  March 27, 2014:  2wk ROV w/ KC>        The patient comes in today for followup after her recent chest x-ray and PFTs. She has known  idiopathic pulmonary fibrosis, and her chest x-ray showed progressive disease. Her PFTs compared to 2013 showed a decline in her FVC and T. LC, but her diffusion capacity was actually a little better. The patient continues to have her dry cough, but has recently started to have more of a wet cough that is productive of only white mucus. She does not feel chest congestion. She was having loose stools from her proton pump inhibitor, and this was discontinued. She now has breakthrough reflux symptoms for which she is taking times. She does feel that tramadol helps her dry cough.      REC>  The patient's most recent chest x-ray showed some disease progression, and although her PFTs do show a drop in her .FVC and TLC, her DLCO is actually slightly improved. Again, I do not think she is a very good candidate for the anti-fibrotic agents, especially since they are associated with severe diarrhea. I worry about her frailty going forward. At this point, I think we will continue to watch her, and work on symptom control. I think it is very important that she stays on a proton pump inhibitor, since reflux is extremely common in pulmonary fibrosis patients. Not only does it worsen cough, but it also can cause progressive disease. She did not do well on Prilosec, so we'll try  her on Protonix. I have also asked her to continue on something for postnasal drip As well. She tells me that tramadol did help her cough, and I have asked her to continue on this as needed. Of note, her ambulatory oximetry today on room air did not show desaturation of significance.  ~  September 20, 2015:  65mo ROV w/ SN>  Her PCP is DrTisovec...      81 y/o WF w/ pulm fibrosis, not on meds and treated only w/ Hycodan prn;  She recently noted incr cough, mostly dry but noted more after eating & strenuous activity; cough syrup helps and she notes that Tramadol helped in past; she denies f/c/s, no CP/ palpit/ edema; she does wake some at night coughing;  she denies SOB w/ ADLs etc; DrTisovec recently added ProairHFA prn use...       EXAM shows Afeb, VSS, O2sat=93% on RA;  HEENT- neg, mallampati1;  Chest- velcro rales ~1/2 way up posteriorly & ant as well;  Heart- RR gr1/6 SEM w/o r/g;  Abd- soft, neg;  Ext- neg w/o c/c/e...   CT Chest 02/16/12 at [facility] showed diffuse bilat interstitial fibrosis w/ subpleural honeycombing- progressively worse compared to 2007; 5mm right apical nodule, 4mm medial RLL nodule, small mediastinal nodes, norm heart size & mild calcif atherosclerotic changes, mult healed left rib fxs, mod mid-thoracic compression fx, mult gallstones noted...  PFT 03/16/12 showed FVC=2.02 (88%), FEV1=1.72 (114%), %1sec=85, mid-flows wnl at 108% predicted;  TLC=3.50 (81%), RV=1.48 (79%), RV/TLC=42;  DLCO=58% predicted => c/w mild restriction and mod decr in diffusion...  PFT 03/20/14 showed FVC=1.88 (88%), FEV1=1.59 (101%), %1sec=85, mid-flows wnl at 183% predicted; TLC=3.31 (69%), RV=1.47 (61%), RV/TLC=44;  DLCO=44% predicted => c/w mild to mod restrictive dis w/ severe decr in diffusion capacity...   CXR 03/2014 showed norm heart size, progressive ILD, old compression T8...  CXR 09/20/15 showed norm heart size, diffuse bilat fibrotic changes, thoracic compression T8 & T12...  Ambulatory oxygen saturation test 09/20/15 showed O2sat=99% on RA at rest w/ pulse=86/min; she ambulatedonly 1 lap & stopped w/ fatigue & SOB; lowest O2sat=94% w/ pulse=119/min...   ?she had recent ONO by DrTisovec => we will try to get copy sent to us to review... PLAN>>  Lauren Avery has severe pulm fibrosis which creates quite a dilemma in a fragile 81 y/o lady; I agree that she is not a good cand for antifibrotic therapy & also not a good cand for Pred rx given her osteoporosis & mult fxs; Rec to try ADVAIR500- 1 inhalation Bid + her ProairHFA as needed; she has Hycodan and Tramadol to use for the cough; deconditioning is a serious issue for her as she has been way  too sedentary over the yrs- rec to consider pulm rehb, silver sneakers, yoga, etc but she must be careful (no strenuous activ & no falling allowed!)...     Past Medical History  Diagnosis  Date  . Osteoporosis >> on RECLAST per DrTisovec   . Pulmonary fibrosis (HCC) >> on Hycodan, Tramadol, ProairHFA prn...   . GERD (gastroesophageal reflux disease) >> on Protonix40 + antireflux regimen   . Hypertension >> on Amlod5 & Cozaar50   . Diabetes mellitus >> on Glimep2mg    . Fracture >> on Norco7,5 & Tramadol50     Left clavicle 12/12 rx w/ sling     left wrist 12/13    Left elbow w/ surg 05/2015 by Domingo DimesrXu    Past Surgical History  Procedure Laterality Date  . Hip fracture  surgery      1984 pin and plate placed right  . Hip surgery      pin and plate removed 12/1912  . Femur fracture surgery       6/08 partial hip replacement right  . Ganglion cyst removal      left wrist 1960  . Orif elbow fracture Right 02/18/2013    Procedure: OPEN REDUCTION INTERNAL FIXATION (ORIF) ELBOW/OLECRANON FRACTURE;  Surgeon: Nadara Mustard, MD;  Location: [facility] OR;  Service: Orthopedics;  Laterality: Right;  . Orif elbow fracture Left 05/09/2015    Procedure: OPEN REDUCTION INTERNAL FIXATION (ORIF) LEFT MEDIAL HUMERAL CONDYLE FRACTURE;  Surgeon: Tarry Kos, MD;  Location: [facility] OR;  Service: Orthopedics;  Laterality: Left;    Outpatient Encounter Prescriptions as of 09/20/2015  Medication Sig  . acetaminophen (TYLENOL) 325 MG tablet Take 2 tablets (650 mg total) by mouth every 6 (six) hours as needed. (Patient taking differently: Take 650 mg by mouth every 6 (six) hours as needed for mild pain, fever or headache. )  . amLODipine (NORVASC) 5 MG tablet Take 5 mg by mouth daily.  . calcium carbonate (TUMS - DOSED IN MG ELEMENTAL CALCIUM) 500 MG chewable tablet Chew 1 tablet by mouth 2 (two) times daily with a meal.  . glimepiride (AMARYL) 2 MG tablet Take 1 tablet (2 mg total) by mouth daily before breakfast.  .  HYDROcodone-acetaminophen (NORCO) 7.5-325 MG per tablet Take 1-2 tablets by mouth every 6 (six) hours as needed for moderate pain.  Marland Kitchen HYDROcodone-homatropine (HYCODAN) 5-1.5 MG/5ML syrup Take 5 mLs by mouth every 4 (four) hours as needed for cough.  . losartan (COZAAR) 50 MG tablet Take 50 mg by mouth daily.  . Multiple Vitamins-Minerals (MULTIVITAMIN WITH MINERALS) tablet Take 1 tablet by mouth daily.  . pantoprazole (PROTONIX) 40 MG tablet Take 1 tablet (40 mg total) by mouth 2 (two) times daily.  Marland Kitchen senna-docusate (SENOKOT S) 8.6-50 MG per tablet Take 1 tablet by mouth at bedtime as needed.  . traMADol (ULTRAM) 50 MG tablet Take 1/2-1 tablet three times daily as directed for cough  . Zoledronic Acid (RECLAST IV) Inject into the vein. yearly  . aspirin EC 325 MG tablet Take 1 tablet (325 mg total) by mouth daily. (Patient not taking: Reported on 09/20/2015)  . cetirizine (ZYRTEC) 10 MG tablet Take 1 tablet (10 mg total) by mouth daily. (Patient not taking: Reported on 09/20/2015)   No facility-administered encounter medications on file as of 09/20/2015.    Allergies  Allergen Reactions  . Hydrochlorothiazide Other (See Comments)    Taken from faxed notes of dr Wylene Simmer (to short stay)  THROMBOCYTOPENIA  . Prilosec [Omeprazole] Diarrhea    Immunization History  Administered Date(s) Administered  . Influenza Split 09/01/2011, 09/15/2012  . Influenza,inj,Quad PF,36+ Mos 08/31/2013  . Influenza-Unspecified 09/06/2015  . PPD Test 05/11/2015, 05/14/2015    Current Medications, Allergies, Past Medical History, Past Surgical History, Family History, and Social History were reviewed in [employer] record.   Review of Systems             All symptoms NEG except where BOLDED >>  Constitutional:  F/C/S, fatigue, anorexia, unexpected weight change. HEENT:  HA, visual changes, hearing loss, earache, nasal symptoms, sore throat, mouth sores, hoarseness. Resp:  cough,  sputum, hemoptysis; SOB, tightness, wheezing. Cardio:  CP, palpit, DOE, orthopnea, edema. GI:  N/V/D/C, blood in stool; reflux, abd pain, distention, gas. GU:  dysuria, freq, urgency, hematuria, flank pain,  voiding difficulty. MS:  joint pain, swelling, tenderness, decr ROM; neck pain, back pain, etc. Neuro:  HA, tremors, seizures, dizziness, syncope, weakness, numbness, gait abn. Skin:  suspicious lesions or skin rash. Heme:  adenopathy, bruising, bleeding. Psyche:  confusion, agitation, sleep disturbance, hallucinations, anxiety, depression suicidal.   Objective:   Physical Exam       Vital Signs:  Reviewed...  General:  WD, WN,    y/o WM in NAD; alert & oriented; pleasant & cooperative... HEENT:  [place]/AT; Conjunctiva- pink, Sclera- nonicteric, EOM-wnl, PERRLA, Fundi-benign; EACs-clear, TMs-wnl; NOSE-clear; THROAT-clear & wnl. Neck:  Supple w/ full ROM; no JVD; normal carotid impulses w/o bruits; no thyromegaly or nodules palpated; no lymphadenopathy. Chest:  Clear to P & A; without wheezes, rales, or rhonchi heard. Heart:  Regular Rhythm; norm S1 & S2 without murmurs, rubs, or gallops detected. Abdomen:  Soft & nontender- no guarding or rebound; normal bowel sounds; no organomegaly or masses palpated.  Rectal:  Neg - prostate 2+ & nontender w/o nodules; stool hematest neg. Ext:  Normal ROM; without deformities or arthritic changes; no varicose veins, venous insuffic, or edema;  Pulses intact w/o bruits. Neuro:  CNs II-XII intact; motor testing normal; sensory testing normal; gait normal & balance OK. Derm:  No lesions noted; no rash etc. Lymph:  No cervical, supraclavicular, axillary, or inguinal adenopathy palpated.   Assessment:      IMP >>     Diffuse pulmonary fibrosis in an 81 y/o lady> Given her age & co-morbidities I agree that she is not a cand for antifibrotic therapy; neither is she a good cand for Pred trial given her severe osteoporosis & mult fxs; therefore we will  start ADVAIR500- 1 inhalation Bid and continue the ProairHFA rescue inhaler prn along w/ the Hycodan and Tramadol prn cough...    Deconditioned and poor exercise tolerance> We discussed this issue & the need for a gradual progressive exercise program- eg PulmRehab vs silver sneakers, [employer], Yoga, etc...    GERD and prob LPR> She needs a vigorous antireflux regimen w/ Protonix40 taken before dinner, NPO after dinner, elev HOB 6" blocks, etc...    HBP> on Amlod & Losar per DrTisovec...    DM> on Glimep per DrTisovec..    Osteoporosis & thoracic compression fxs, mult other fxs (hip, femur, clavicle, wrist, elbow)> on RECLAST + calcium/ MVI/ VitD, Norco, Tramadol...  PLAN >>     Lauren Avery has severe pulm fibrosis which creates quite a dilemma in a fragile 81 y/o lady; I agree that she is not a good cand for antifibrotic therapy & also not a good cand for Pred rx given her osteoporosis & mult fxs; Rec to try ADVAIR500- 1 inhalation Bid + her ProairHFA as needed; she has Hycodan and Tramadol to use for the cough; deconditioning is a serious issue for her as she has been way too sedentary over the yrs- rec to consider pulm rehb, silver sneakers, yoga, etc but she must be careful (no strenuous activ & no falling allowed!)     Plan:     Patient's Medications  New Prescriptions                                        ProairHFA - one inhalation every 4H as needed.Marland KitchenMarland Kitchen  ADVAIR 500/50 - one inhalation twice daily...  Previous Medications   ACETAMINOPHEN (TYLENOL) 325 MG TABLET    Take 2 tablets (650 mg total) by mouth every 6 (six) hours as needed.   AMLODIPINE (NORVASC) 5 MG TABLET    Take 5 mg by mouth daily.   ASPIRIN EC 325 MG TABLET    Take 1 tablet (325 mg total) by mouth daily.   CALCIUM CARBONATE (TUMS - DOSED IN MG ELEMENTAL CALCIUM) 500 MG CHEWABLE TABLET    Chew 1 tablet by mouth 2 (two) times daily with a meal.   CETIRIZINE (ZYRTEC) 10 MG TABLET    Take 1  tablet (10 mg total) by mouth daily.   GLIMEPIRIDE (AMARYL) 2 MG TABLET    Take 1 tablet (2 mg total) by mouth daily before breakfast.   HYDROCODONE-ACETAMINOPHEN (NORCO) 7.5-325 MG PER TABLET    Take 1-2 tablets by mouth every 6 (six) hours as needed for moderate pain.   LOSARTAN (COZAAR) 50 MG TABLET    Take 50 mg by mouth daily.   MULTIPLE VITAMINS-MINERALS (MULTIVITAMIN WITH MINERALS) TABLET    Take 1 tablet by mouth daily.   SENNA-DOCUSATE (SENOKOT S) 8.6-50 MG PER TABLET    Take 1 tablet by mouth at bedtime as needed.   ZOLEDRONIC ACID (RECLAST IV)    Inject into the vein. yearly  Modified Medications   Modified Medication Previous Medication   HYDROCODONE-HOMATROPINE (HYCODAN) 5-1.5 MG/5ML SYRUP HYDROcodone-homatropine (HYCODAN) 5-1.5 MG/5ML syrup      Take 5 mLs by mouth every 4 (four) hours as needed for cough.    Take 5 mLs by mouth at bedtime as needed for cough.   PANTOPRAZOLE (PROTONIX) 40 MG TABLET pantoprazole (PROTONIX) 40 MG tablet      Take 1 tablet (40 mg total) by mouth 2 (two) times daily.    Take 1 tablet (40 mg total) by mouth 2 (two) times daily.   TRAMADOL (ULTRAM) 50 MG TABLET traMADol (ULTRAM) 50 MG tablet      Take 1/2-1 tablet three times daily as directed for cough    Take 1 tablet (50 mg total) by mouth every 6 (six) hours as needed (cough).  Discontinued Medications   No medications on file

## 2015-09-20 NOTE — Patient Instructions (Signed)
Lauren Avery-- it was nice meeting you today...  We reviewed your pulmonary fibrosis condition and assessed your oxygenation and repeated your CXR today...    We will contact you w/ the results when available...   Continue your cough suppression w/     HYCODAN cough syrup-- one tsp every 4H as needed...    TRAMADOL 50mg  tabs-- one tab up to 3 times daily as we discussed...  Maintain the ANTIREFLUX regimen as follows>    Take the PROTONIX (Pantoprazole) 40mg  one tab taken about 30 min before the evening meal...    Do not eat or drink much after dinner in the eve (this allows your stomach to totally empty beofre bedtime)...    Elevate the head of your bed on 6" blocks as we discussed...  Call for any questions...  Let's plan a follow up visit in 29mo, sooner if needed for problems.Marland Kitchen..Marland Kitchen

## 2015-09-28 ENCOUNTER — Other Ambulatory Visit: Payer: Self-pay | Admitting: Emergency Medicine

## 2015-09-28 MED ORDER — FLUTICASONE-SALMETEROL 500-50 MCG/DOSE IN AEPB: 1.0000 | INHALATION_SPRAY | Freq: Two times a day (BID) | RESPIRATORY_TRACT | Status: DC

## 2015-09-28 MED ORDER — ALBUTEROL SULFATE HFA 108 (90 BASE) MCG/ACT IN AERS: 2.0000 | INHALATION_SPRAY | Freq: Four times a day (QID) | RESPIRATORY_TRACT | Status: DC | PRN

## 2015-10-09 ENCOUNTER — Telehealth: Payer: Self-pay | Admitting: *Deleted

## 2015-10-09 NOTE — Telephone Encounter (Signed)
Received PA for Pantoprazole for pt to take 40mg  BID. In his OV note says:   Maintain the ANTIREFLUX regimen as follows>  Take the PROTONIX (Pantoprazole) 40mg  one tab taken about 30 min before the evening meal...  Do not eat or drink much after dinner in the eve (this allows your stomach to totally empty beofre bedtime)...  Elevate the head of your bed on 6" blocks as we discussed...  Please advise if pt needs to take daily or BID. PA is being required because it was sent in as BID. Thanks

## 2015-10-10 NOTE — Telephone Encounter (Signed)
Lauren Avery, please advise. Thanks.

## 2015-10-11 ENCOUNTER — Ambulatory Visit: Payer: Medicare Other | Admitting: Podiatry

## 2015-10-11 ENCOUNTER — Ambulatory Visit (INDEPENDENT_AMBULATORY_CARE_PROVIDER_SITE_OTHER): Payer: Medicare Other | Admitting: Podiatry

## 2015-10-11 ENCOUNTER — Encounter: Payer: Self-pay | Admitting: Podiatry

## 2015-10-11 DIAGNOSIS — M79673 Pain in unspecified foot: Secondary | ICD-10-CM | POA: Diagnosis not present

## 2015-10-11 DIAGNOSIS — B351 Tinea unguium: Secondary | ICD-10-CM

## 2015-10-11 DIAGNOSIS — E1142 Type 2 diabetes mellitus with diabetic polyneuropathy: Secondary | ICD-10-CM | POA: Diagnosis not present

## 2015-10-11 NOTE — Progress Notes (Signed)
She presents today to follow-up for diabetic footcare. She states her toenails are long and thick and painful.  Objective: Vital signs are stable she is alert and oriented 3. She denies numbness and tingling in the toes. Pulses are palpable bilateral. She is thick yellow dystrophic with mycotic nails.  Assessment: Limb secondary to onychomycosis 1 through 5 bilateral. With diabetes mellitus.  Plan: Debridement of nails 1 through 5 bilateral covered service secondary to pain.

## 2015-10-16 NOTE — Telephone Encounter (Signed)
Please advise Fleet ContrasRachel thanks

## 2015-10-16 NOTE — Telephone Encounter (Signed)
Per SN>>Medication is suppose to be taken BID.

## 2015-10-17 NOTE — Telephone Encounter (Signed)
[facility],[id number], called and states this has been approved and a letter will be sent.

## 2015-10-17 NOTE — Telephone Encounter (Signed)
Initiated PA for Pantoprazole 40mg  BID. [facility] by [facility] and faxed back. Will await response.

## 2015-10-17 NOTE — Telephone Encounter (Signed)
Called and spoke with Coralee [place] with [facility] at [phone]. She verified that the PA was approved from 10-17-15 thru 10-16-16. She stated the patient had been notified. She stated that our office and the patient will receive a approval letter in the mail.  I called and spoke with the patient and verified that she did receive a call from [facility] stating that the PA was approved. She stated she did receive the call and that she picked it up from the pharmacy for $6.00. The patient voiced understanding and had no further questions. Nothing further needed. Will sign off on message.

## 2015-10-17 NOTE — Telephone Encounter (Signed)
Will initiate PA for medication.

## 2016-01-08 DIAGNOSIS — S42465D Nondisplaced fracture of medial condyle of left humerus, subsequent encounter for fracture with routine healing: Secondary | ICD-10-CM | POA: Diagnosis not present

## 2016-01-08 DIAGNOSIS — I10 Essential (primary) hypertension: Secondary | ICD-10-CM | POA: Diagnosis not present

## 2016-01-08 DIAGNOSIS — E119 Type 2 diabetes mellitus without complications: Secondary | ICD-10-CM | POA: Diagnosis not present

## 2016-01-08 DIAGNOSIS — Z4789 Encounter for other orthopedic aftercare: Secondary | ICD-10-CM | POA: Diagnosis not present

## 2016-01-15 ENCOUNTER — Ambulatory Visit (INDEPENDENT_AMBULATORY_CARE_PROVIDER_SITE_OTHER): Payer: Medicare Other | Admitting: Podiatry

## 2016-01-15 ENCOUNTER — Encounter: Payer: Self-pay | Admitting: Podiatry

## 2016-01-15 DIAGNOSIS — B351 Tinea unguium: Secondary | ICD-10-CM

## 2016-01-15 DIAGNOSIS — E1142 Type 2 diabetes mellitus with diabetic polyneuropathy: Secondary | ICD-10-CM

## 2016-01-15 DIAGNOSIS — M79673 Pain in unspecified foot: Secondary | ICD-10-CM | POA: Diagnosis not present

## 2016-01-15 NOTE — Progress Notes (Signed)
Patient ID: Lauren Avery, female   DOB: 28-Mar-1927, 82 y.o.   MRN: [id number] Complaint:  Visit Type: Patient returns to my office for continued preventative foot care services. Complaint: Patient states" my nails have grown long and thick and become painful to walk and wear shoes" Patient has been diagnosed with DM with no foot complications. The patient presents for preventative foot care services. No changes to ROS  Podiatric Exam: Vascular: dorsalis pedis and posterior tibial pulses are palpable bilateral. Capillary return is immediate. Temperature gradient is WNL. Skin turgor WNL  Sensorium: Diminished  Semmes Weinstein monofilament test. Normal tactile sensation bilaterally. Nail Exam: Pt has thick disfigured discolored nails with subungual debris noted bilateral entire nail hallux through fifth toenails Ulcer Exam: There is no evidence of ulcer or pre-ulcerative changes or infection. Orthopedic Exam: Muscle tone and strength are WNL. No limitations in general ROM. No crepitus or effusions noted. Foot type and digits show no abnormalities. Bony prominences are unremarkable. Skin: No Porokeratosis. No infection or ulcers  Diagnosis:  Onychomycosis, , Pain in right toe, pain in left toes  Treatment & Plan Procedures and Treatment: Consent by patient was obtained for treatment procedures. The patient understood the discussion of treatment and procedures well. All questions were answered thoroughly reviewed. Debridement of mycotic and hypertrophic toenails, 1 through 5 bilateral and clearing of subungual debris. No ulceration, no infection noted.  Return Visit-Office Procedure: Patient instructed to return to the office for a follow up visit 3 months for continued evaluation and treatment.    Helane Gunther DPM

## 2016-01-21 DIAGNOSIS — D692 Other nonthrombocytopenic purpura: Secondary | ICD-10-CM | POA: Diagnosis not present

## 2016-01-21 DIAGNOSIS — E1129 Type 2 diabetes mellitus with other diabetic kidney complication: Secondary | ICD-10-CM | POA: Diagnosis not present

## 2016-01-21 DIAGNOSIS — R808 Other proteinuria: Secondary | ICD-10-CM | POA: Diagnosis not present

## 2016-01-21 DIAGNOSIS — S329XXD Fracture of unspecified parts of lumbosacral spine and pelvis, subsequent encounter for fracture with routine healing: Secondary | ICD-10-CM | POA: Diagnosis not present

## 2016-01-21 DIAGNOSIS — R946 Abnormal results of thyroid function studies: Secondary | ICD-10-CM | POA: Diagnosis not present

## 2016-01-21 DIAGNOSIS — I10 Essential (primary) hypertension: Secondary | ICD-10-CM | POA: Diagnosis not present

## 2016-01-21 DIAGNOSIS — J8410 Pulmonary fibrosis, unspecified: Secondary | ICD-10-CM | POA: Diagnosis not present

## 2016-01-21 DIAGNOSIS — E1142 Type 2 diabetes mellitus with diabetic polyneuropathy: Secondary | ICD-10-CM | POA: Diagnosis not present

## 2016-01-21 DIAGNOSIS — R05 Cough: Secondary | ICD-10-CM | POA: Diagnosis not present

## 2016-02-05 DIAGNOSIS — E119 Type 2 diabetes mellitus without complications: Secondary | ICD-10-CM | POA: Diagnosis not present

## 2016-02-05 DIAGNOSIS — Z4789 Encounter for other orthopedic aftercare: Secondary | ICD-10-CM | POA: Diagnosis not present

## 2016-02-05 DIAGNOSIS — S42465D Nondisplaced fracture of medial condyle of left humerus, subsequent encounter for fracture with routine healing: Secondary | ICD-10-CM | POA: Diagnosis not present

## 2016-02-05 DIAGNOSIS — I10 Essential (primary) hypertension: Secondary | ICD-10-CM | POA: Diagnosis not present

## 2016-02-18 ENCOUNTER — Encounter (HOSPITAL_COMMUNITY): Payer: Self-pay

## 2016-02-18 ENCOUNTER — Ambulatory Visit (HOSPITAL_COMMUNITY)
Admission: RE | Admit: 2016-02-18 | Discharge: 2016-02-18 | Disposition: A | Discharge status: Home / Self Care | Payer: Medicare Other | Source: Ambulatory Visit | Attending: Internal Medicine | Admitting: Internal Medicine

## 2016-02-18 DIAGNOSIS — M810 Age-related osteoporosis without current pathological fracture: Secondary | ICD-10-CM | POA: Insufficient documentation

## 2016-02-18 MED ORDER — SODIUM CHLORIDE 0.9 % IV SOLN
Freq: Once | INTRAVENOUS | Status: AC
  Administered 2016-02-18: 12:00:00 via INTRAVENOUS

## 2016-02-18 MED ORDER — ZOLEDRONIC ACID 5 MG/100ML IV SOLN
5.0000 mg | Freq: Once | INTRAVENOUS | Status: AC
  Administered 2016-02-18: 5 mg via INTRAVENOUS
  Filled 2016-02-18: qty 100

## 2016-02-18 NOTE — Discharge Instructions (Signed)

## 2016-03-07 DIAGNOSIS — E119 Type 2 diabetes mellitus without complications: Secondary | ICD-10-CM | POA: Diagnosis not present

## 2016-03-07 DIAGNOSIS — S42465D Nondisplaced fracture of medial condyle of left humerus, subsequent encounter for fracture with routine healing: Secondary | ICD-10-CM | POA: Diagnosis not present

## 2016-03-07 DIAGNOSIS — I10 Essential (primary) hypertension: Secondary | ICD-10-CM | POA: Diagnosis not present

## 2016-03-07 DIAGNOSIS — Z4789 Encounter for other orthopedic aftercare: Secondary | ICD-10-CM | POA: Diagnosis not present

## 2016-03-20 ENCOUNTER — Encounter: Payer: Self-pay | Admitting: Pulmonary Disease

## 2016-03-20 ENCOUNTER — Ambulatory Visit (INDEPENDENT_AMBULATORY_CARE_PROVIDER_SITE_OTHER): Payer: Medicare Other | Admitting: Pulmonary Disease

## 2016-03-20 VITALS — BP 136/60 | HR 79 | Temp 97.0°F | Ht 62.0 in | Wt 123.8 lb

## 2016-03-20 DIAGNOSIS — K219 Gastro-esophageal reflux disease without esophagitis: Secondary | ICD-10-CM

## 2016-03-20 DIAGNOSIS — J84112 Idiopathic pulmonary fibrosis: Secondary | ICD-10-CM | POA: Diagnosis not present

## 2016-03-20 DIAGNOSIS — J387 Other diseases of larynx: Secondary | ICD-10-CM | POA: Diagnosis not present

## 2016-03-20 DIAGNOSIS — E1129 Type 2 diabetes mellitus with other diabetic kidney complication: Secondary | ICD-10-CM | POA: Diagnosis not present

## 2016-03-20 DIAGNOSIS — R05 Cough: Secondary | ICD-10-CM

## 2016-03-20 DIAGNOSIS — R053 Chronic cough: Secondary | ICD-10-CM

## 2016-03-20 NOTE — Progress Notes (Signed)
Subjective:     Patient ID: Lauren Avery, female   DOB: 19-Sep-1927, 82 y.o.   MRN: [id number]  HPI  ~  March 16, 2014:  [age]yr ROV w/ KC>        The patient comes in today for followup of her known idiopathic pulmonary fibrosis and chronic cough that is felt secondary to upper and lower airway issues. The patient was last seen 2 years ago, and she never followed up with her chest x-ray and pulmonary function studies because of other medical issues that required hospitalizations. She comes in today where her cough continues to be a significant problem, and she feels that her exertional tolerance has declined as well. She is no longer taking her proton pump inhibitor, and is having ongoing reflux symptoms. She is also having a lot of rhinorrhea, and is not taking her antihistamine on a regular basis.      REC>  1) Chronic cough:  The pt continues to have dry cough that I suspect is both upper and lower airway in origin. She is having ongoing reflux and in is no longer taking her PPI, and also is having rhinorrhea. Chronic dry cough is also a hallmark of IPF. I have also stressed to her the importance of reflux treatment in patients with interstitial lung disease, and how it can actually cause progressive disease . It has become standard of care to treat all patients with IPF with a proton pump inhibitor empirically. Will also try cough suppression both during the day and night.  2) IPF:  The patient has idiopathic pulmonary fibrosis, and she has not had recent chest x-ray or PFTs because of other medical issues that needed to be taken care of. She does feel that her breathing is a little worse now than in the past, and I will schedule her for a followup chest x-ray and pulmonary function studies. I do not think she is a great candidate for anti-fibrotic therapy.  ~  March 27, 2014:  2wk ROV w/ KC>        The patient comes in today for followup after her recent chest x-ray and PFTs. She has known  idiopathic pulmonary fibrosis, and her chest x-ray showed progressive disease. Her PFTs compared to 2013 showed a decline in her FVC and T. LC, but her diffusion capacity was actually a little better. The patient continues to have her dry cough, but has recently started to have more of a wet cough that is productive of only white mucus. She does not feel chest congestion. She was having loose stools from her proton pump inhibitor, and this was discontinued. She now has breakthrough reflux symptoms for which she is taking times. She does feel that tramadol helps her dry cough.      REC>  The patient's most recent chest x-ray showed some disease progression, and although her PFTs do show a drop in her .FVC and TLC, her DLCO is actually slightly improved. Again, I do not think she is a very good candidate for the anti-fibrotic agents, especially since they are associated with severe diarrhea. I worry about her frailty going forward. At this point, I think we will continue to watch her, and work on symptom control. I think it is very important that she stays on a proton pump inhibitor, since reflux is extremely common in pulmonary fibrosis patients. Not only does it worsen cough, but it also can cause progressive disease. She did not do well on Prilosec, so we'll  try her on Protonix. I have also asked her to continue on something for postnasal drip As well. She tells me that tramadol did help her cough, and I have asked her to continue on this as needed. Of note, her ambulatory oximetry today on room air did not show desaturation of significance.  ~  September 20, 2015:  72mo ROV w/ SN>  Her PCP is DrTisovec...      82 y/o WF w/ pulm fibrosis, not on meds and treated only w/ Hycodan prn;  She recently noted incr cough, mostly dry but noted more after eating & strenuous activity; cough syrup helps and she notes that Tramadol helped in past; she denies f/c/s, no CP/ palpit/ edema; she does wake some at night coughing;  she denies SOB w/ ADLs etc; DrTisovec recently added ProairHFA prn use...       EXAM shows Afeb, VSS, O2sat=93% on RA;  HEENT- neg, mallampati1;  Chest- velcro rales ~1/2 way up posteriorly & ant as well;  Heart- RR gr1/6 SEM w/o r/g;  Abd- soft, neg;  Ext- neg w/o c/c/e...   CT Chest 02/16/12 at [facility] showed diffuse bilat interstitial fibrosis w/ subpleural honeycombing- progressively worse compared to 2007; 5mm right apical nodule, 4mm medial RLL nodule, small mediastinal nodes, norm heart size & mild calcif atherosclerotic changes, mult healed left rib fxs, mod mid-thoracic compression fx, mult gallstones noted...  PFT 03/16/12 showed FVC=2.02 (88%), FEV1=1.72 (114%), %1sec=85, mid-flows wnl at 108% predicted;  TLC=3.50 (81%), RV=1.48 (79%), RV/TLC=42;  DLCO=58% predicted => c/w mild restriction and mod decr in diffusion...  PFT 03/20/14 showed FVC=1.88 (88%), FEV1=1.59 (101%), %1sec=85, mid-flows wnl at 183% predicted; TLC=3.31 (69%), RV=1.47 (61%), RV/TLC=44;  DLCO=44% predicted => c/w mild to mod restrictive dis w/ severe decr in diffusion capacity...   CXR 03/2014 showed norm heart size, progressive ILD, old compression T8...  CXR 09/20/15 showed norm heart size, diffuse bilat fibrotic changes, thoracic compression T8 & T12...  Ambulatory oxygen saturation test 09/20/15 showed O2sat=99% on RA at rest w/ pulse=86/min; she ambulatedonly 1 lap & stopped w/ fatigue & SOB; lowest O2sat=94% w/ pulse=119/min...   ?she had recent ONO by DrTisovec => we will try to get copy sent to us to review... PLAN>>  Lauren Avery has severe pulm fibrosis which creates quite a dilemma in a fragile 82 y/o lady; I agree that she is not a good cand for antifibrotic therapy & also not a good cand for Pred rx given her osteoporosis & mult fxs; Rec to try ADVAIR500- 1 inhalation Bid + her ProairHFA as needed; she has Hycodan and Tramadol to use for the cough; deconditioning is a serious issue for her as she has been way  too sedentary over the yrs- rec to consider pulm rehb, silver sneakers, yoga, etc but she must be careful (no strenuous activ & no falling allowed!).Marland Kitchen..   ~  March 20, 2016:  48mo ROV w/ SN>  Lauren Avery has her own ideas about things and is pretty set in her ways at 82 y/o and she tells me on the one hand that her breathing is the same, then on the other hand she notes that she's not taking our meds!  Recall last visit we reviewed her IPF, GERD & LPR, plus her cough & deconditioning> we recommended trial of ADVAIR500- one inhalation Bid, PROAIR-HFA as needed, Hycodan cough syrup and Tramadol for the cough, and a vigorous antireflux regimen w/ Protonix/ NPO after dinner/ elev HOB... She pretty much hasn't done any  of this (much to her daughter's chagrin) so we spent some time reviewing the diagnoses and recommended treatments...    IPF>  Progressive ILD on serial CXR/ scans; he's only been using the Advair500 one puff daily; she is 82 y/o & frail w/ osteoporosis & mult fxs => we discussed trial ADVAIR500Bid...    GERD/ LPR>  She stopped the PPI & never followed the antireflux regimen;  We reviewed the need for Protonix40 taken before dinner, NPO after dinner, elev HOB 6" and she will comply she says...    Cough>  She has Hycodan and Tramadol; explained how antireflux regimen will help as well...    Severe deconditioning>  She has remained sedentary & we discussed the implications for NHP if she doesn't keep moving! Discussed exercise program to improve her stamina...    MEDICAL issues>  HBP, DM, osteoporosis, compression fxs, etc => per DrTisovec... EXAM shows Afeb, VSS, O2sat=97% on RA;  HEENT- neg, mallampati1;  Chest- velcro rales ~1/2 way up posteriorly, no wheezing or rhonchi;  Heart- RR gr1/6 SEM w/o r/g;  Abd- soft, neg;  Ext- neg w/o c/c/e;  Neuro- intact...  IMP/PLAN>>  Lauren Avery is asked to get with the program & see if she responds- use the AdvairBid, ProventilHFA prn, follow the vigorous antireflux  regimen, use the cough suppressants as needed & start improving her exercise program...     Past Medical History  Diagnosis  Date  . Osteoporosis >> on RECLAST per DrTisovec   . Pulmonary fibrosis (HCC) >> on Hycodan, Tramadol, ProairHFA prn...   . GERD (gastroesophageal reflux disease) >> on Protonix40 + antireflux regimen   . Hypertension >> on Amlod5 & Cozaar50   . Diabetes mellitus >> on Glimep2mg    . Fracture >> on Norco7,5 & Tramadol50     Left clavicle 12/12 rx w/ sling     left wrist 12/13    Left elbow w/ surg 05/2015 by Domingo Dimes    Past Surgical History  Procedure Laterality Date  . Hip fracture surgery      1984 pin and plate placed right  . Hip surgery      pin and plate removed 07/6766  . Femur fracture surgery       6/08 partial hip replacement right  . Ganglion cyst removal      left wrist 1960  . Orif elbow fracture Right 02/18/2013    Procedure: OPEN REDUCTION INTERNAL FIXATION (ORIF) ELBOW/OLECRANON FRACTURE;  Surgeon: Nadara Mustard, MD;  Location: [facility] OR;  Service: Orthopedics;  Laterality: Right;  . Orif elbow fracture Left 05/09/2015    Procedure: OPEN REDUCTION INTERNAL FIXATION (ORIF) LEFT MEDIAL HUMERAL CONDYLE FRACTURE;  Surgeon: Tarry Kos, MD;  Location: [facility] OR;  Service: Orthopedics;  Laterality: Left;    Outpatient Encounter Prescriptions as of 03/20/2016  Medication Sig  . acetaminophen (TYLENOL) 325 MG tablet Take 2 tablets (650 mg total) by mouth every 6 (six) hours as needed. (Patient taking differently: Take 650 mg by mouth every 6 (six) hours as needed for mild pain, fever or headache. )  . amLODipine (NORVASC) 5 MG tablet Take 5 mg by mouth daily.  . calcium carbonate (TUMS - DOSED IN MG ELEMENTAL CALCIUM) 500 MG chewable tablet Chew 1 tablet by mouth 2 (two) times daily with a meal.  . Fluticasone-Salmeterol (ADVAIR) 500-50 MCG/DOSE AEPB Inhale 1 puff into the lungs 2 (two) times daily.  Marland Kitchen glimepiride (AMARYL) 2 MG tablet Take 4 mg by mouth daily  before breakfast.   .  HYDROcodone-homatropine (HYCODAN) 5-1.5 MG/5ML syrup Take 5 mLs by mouth every 4 (four) hours as needed for cough.  . losartan (COZAAR) 50 MG tablet Take 50 mg by mouth daily.  . Multiple Vitamins-Minerals (MULTIVITAMIN WITH MINERALS) tablet Take 1 tablet by mouth daily.  . pantoprazole (PROTONIX) 40 MG tablet Take 1 tablet (40 mg total) by mouth 2 (two) times daily.  . traMADol (ULTRAM) 50 MG tablet Take 1/2-1 tablet three times daily as directed for cough  . Zoledronic Acid (RECLAST IV) Inject into the vein. yearly  . [DISCONTINUED] albuterol (PROVENTIL HFA;VENTOLIN HFA) 108 (90 BASE) MCG/ACT inhaler Inhale 2 puffs into the lungs every 6 (six) hours as needed for wheezing or shortness of breath.  . [DISCONTINUED] aspirin EC 325 MG tablet Take 1 tablet (325 mg total) by mouth daily.  . [DISCONTINUED] cetirizine (ZYRTEC) 10 MG tablet Take 1 tablet (10 mg total) by mouth daily.  . [DISCONTINUED] HYDROcodone-acetaminophen (NORCO) 7.5-325 MG per tablet Take 1-2 tablets by mouth every 6 (six) hours as needed for moderate pain.  . [DISCONTINUED] senna-docusate (SENOKOT S) 8.6-50 MG per tablet Take 1 tablet by mouth at bedtime as needed.   No facility-administered encounter medications on file as of 03/20/2016.    Allergies  Allergen Reactions  . Hydrochlorothiazide Other (See Comments)    Taken from faxed notes of dr Wylene Simmer (to short stay)  THROMBOCYTOPENIA  . Prilosec [Omeprazole] Diarrhea    Immunization History  Administered Date(s) Administered  . Influenza Split 09/01/2011, 09/15/2012  . Influenza,inj,Quad PF,36+ Mos 08/31/2013  . Influenza-Unspecified 09/06/2015  . PPD Test 05/11/2015, 05/14/2015    Current Medications, Allergies, Past Medical History, Past Surgical History, Family History, and Social History were reviewed in [employer] record.   Review of Systems             All symptoms NEG except where BOLDED >>  Constitutional:   F/C/S, fatigue, anorexia, unexpected weight change. HEENT:  HA, visual changes, hearing loss, earache, nasal symptoms, sore throat, mouth sores, hoarseness. Resp:  cough, sputum, hemoptysis; SOB, tightness, wheezing. Cardio:  CP, palpit, DOE, orthopnea, edema. GI:  N/V/D/C, blood in stool; reflux, abd pain, distention, gas. GU:  dysuria, freq, urgency, hematuria, flank pain, voiding difficulty. MS:  joint pain, swelling, tenderness, decr ROM; neck pain, back pain, etc. Neuro:  HA, tremors, seizures, dizziness, syncope, weakness, numbness, gait abn. Skin:  suspicious lesions or skin rash. Heme:  adenopathy, bruising, bleeding. Psyche:  confusion, agitation, sleep disturbance, hallucinations, anxiety, depression suicidal.   Objective:   Physical Exam       Vital Signs:  Reviewed...  General:  WD, petite, 82 y/o WF in NAD; alert & oriented; pleasant & cooperative... HEENT:  [place]/AT; Conjunctiva- pink, Sclera- nonicteric, EOM-wnl, PERRLA, EACs-clear, TMs-wnl; NOSE-clear; THROAT-clear & wnl. Neck:  Supple w/ fair ROM; no JVD; normal carotid impulses w/o bruits; no thyromegaly or nodules palpated; no lymphadenopathy. Chest:  Bibasilar velcro rales ~1/3rd to 1/2 the way up the back, no wheezing/ rhonchi/ consolidation... Heart:  Regular Rhythm; norm S1 & S2 without murmurs, rubs, or gallops detected. Abdomen:  Soft & nontender- no guarding or rebound; normal bowel sounds; no organomegaly or masses palpated. Ext:  Sl decr ROM; without deformities +arthritic changes; no varicose veins, venous insuffic, or edema;  Pulses intact w/o bruits. Neuro:  CNs II-XII intact; motor testing normal; sensory testing normal; gait normal & balance OK. Derm:  No lesions noted; no rash etc. Lymph:  No cervical, supraclavicular, axillary, or inguinal adenopathy palpated.  Assessment:      IMP >>     Diffuse pulmonary fibrosis in an 82 y/o lady> Given her age & co-morbidities I agree that she is not a cand for  antifibrotic therapy; neither is she a good cand for Pred trial given her severe osteoporosis & mult fxs; therefore we will start ADVAIR500- 1 inhalation Bid and continue the ProairHFA rescue inhaler prn along w/ the Hycodan and Tramadol prn cough...    Deconditioned and poor exercise tolerance> We discussed this issue & the need for a gradual progressive exercise program- eg PulmRehab vs silver sneakers, [employer], Yoga, etc...    GERD and prob LPR> She needs a vigorous antireflux regimen w/ Protonix40 taken before dinner, NPO after dinner, elev HOB 6" blocks, etc...    HBP> on Amlod & Losar per DrTisovec...    DM> on Glimep per DrTisovec..    Osteoporosis & thoracic compression fxs, mult other fxs (hip, femur, clavicle, wrist, elbow)> on RECLAST + calcium/ MVI/ VitD, Norco, Tramadol...  PLAN >>  09/20/15>   Karley has severe pulm fibrosis which creates quite a dilemma in a fragile 82 y/o lady; I agree that she is not a good cand for antifibrotic therapy & also not a good cand for Pred rx given her osteoporosis & mult fxs; Rec to try ADVAIR500- 1 inhalation Bid + her ProairHFA as needed; she has Hycodan and Tramadol to use for the cough; deconditioning is a serious issue for her as she has been way too sedentary over the yrs- rec to consider pulm rehb, silver sneakers, yoga, etc but she must be careful (no strenuous activ & no falling allowed!) 03/20/16>   Lauren Avery is asked to get with the program & see if she responds- use the AdvairBid, ProventilHFA prn, follow the vigorous antireflux regimen, use the cough suppressants as needed & start improving her exercise program...      Plan:     Patient's Medications  New Prescriptions                                        ProairHFA - one inhalation every 4H as needed...                                        ADVAIR 500/50 - one inhalation twice daily...  Previous Medications   ACETAMINOPHEN (TYLENOL) 325 MG TABLET    Take 2 tablets (650 mg total) by  mouth every 6 (six) hours as needed.   AMLODIPINE (NORVASC) 5 MG TABLET    Take 5 mg by mouth daily.   ASPIRIN EC 325 MG TABLET    Take 1 tablet (325 mg total) by mouth daily.   CALCIUM CARBONATE (TUMS - DOSED IN MG ELEMENTAL CALCIUM) 500 MG CHEWABLE TABLET    Chew 1 tablet by mouth 2 (two) times daily with a meal.   CETIRIZINE (ZYRTEC) 10 MG TABLET    Take 1 tablet (10 mg total) by mouth daily.   GLIMEPIRIDE (AMARYL) 2 MG TABLET    Take 1 tablet (2 mg total) by mouth daily before breakfast.   HYDROCODONE-ACETAMINOPHEN (NORCO) 7.5-325 MG PER TABLET    Take 1-2 tablets by mouth every 6 (six) hours as needed for moderate pain.   LOSARTAN (COZAAR) 50 MG TABLET    Take 50  mg by mouth daily.   MULTIPLE VITAMINS-MINERALS (MULTIVITAMIN WITH MINERALS) TABLET    Take 1 tablet by mouth daily.   SENNA-DOCUSATE (SENOKOT S) 8.6-50 MG PER TABLET    Take 1 tablet by mouth at bedtime as needed.   ZOLEDRONIC ACID (RECLAST IV)    Inject into the vein. yearly  Modified Medications   Modified Medication Previous Medication   HYDROCODONE-HOMATROPINE (HYCODAN) 5-1.5 MG/5ML SYRUP HYDROcodone-homatropine (HYCODAN) 5-1.5 MG/5ML syrup      Take 5 mLs by mouth every 4 (four) hours as needed for cough.    Take 5 mLs by mouth at bedtime as needed for cough.   PANTOPRAZOLE (PROTONIX) 40 MG TABLET pantoprazole (PROTONIX) 40 MG tablet      Take 1 tablet (40 mg total) by mouth 2 (two) times daily.    Take 1 tablet (40 mg total) by mouth 2 (two) times daily.   TRAMADOL (ULTRAM) 50 MG TABLET traMADol (ULTRAM) 50 MG tablet      Take 1/2-1 tablet three times daily as directed for cough    Take 1 tablet (50 mg total) by mouth every 6 (six) hours as needed (cough).  Discontinued Medications   No medications on file

## 2016-03-20 NOTE — Patient Instructions (Signed)
Today we updated your med list in our EPIC system...    Continue your current medications the same...  We discussed taking the ADVAIR one inhalation twice daily...  We reviewed the ANTIREFLUX regimen>>     Take the PROTONIX (Pantoprazole) 40mg  one tab taken about 30 min before the evening meal...  Do not eat or drink much after dinner in the eve (this allows your stomach to totally empty beofre bedtime)...  Elevate the head of your bed on 6" blocks as we discussed...  Call for any questions...  Let's plan a follow up visit in 29mo, sooner if needed for problems.Marland Kitchen..Marland Kitchen

## 2016-03-28 DIAGNOSIS — L209 Atopic dermatitis, unspecified: Secondary | ICD-10-CM | POA: Diagnosis not present

## 2016-03-28 DIAGNOSIS — Z6823 Body mass index (BMI) 23.0-23.9, adult: Secondary | ICD-10-CM | POA: Diagnosis not present

## 2016-04-15 ENCOUNTER — Ambulatory Visit (INDEPENDENT_AMBULATORY_CARE_PROVIDER_SITE_OTHER): Payer: Medicare Other | Admitting: Podiatry

## 2016-04-15 ENCOUNTER — Encounter: Payer: Self-pay | Admitting: Podiatry

## 2016-04-15 DIAGNOSIS — E1142 Type 2 diabetes mellitus with diabetic polyneuropathy: Secondary | ICD-10-CM

## 2016-04-15 DIAGNOSIS — M79673 Pain in unspecified foot: Secondary | ICD-10-CM

## 2016-04-15 DIAGNOSIS — B351 Tinea unguium: Secondary | ICD-10-CM

## 2016-04-15 NOTE — Progress Notes (Signed)
Patient ID: Lauren Avery, female   DOB: 02/19/1927, 82 y.o.   MRN: [id number] Complaint:  Visit Type: Patient returns to my office for continued preventative foot care services. Complaint: Patient states" my nails have grown long and thick and become painful to walk and wear shoes" Patient has been diagnosed with DM with no foot complications. The patient presents for preventative foot care services. No changes to ROS  Podiatric Exam: Vascular: dorsalis pedis and posterior tibial pulses are palpable bilateral. Capillary return is immediate. Temperature gradient is WNL. Skin turgor WNL  Sensorium: Diminished  Semmes Weinstein monofilament test. Normal tactile sensation bilaterally. Nail Exam: Pt has thick disfigured discolored nails with subungual debris noted bilateral entire nail hallux through fifth toenails Ulcer Exam: There is no evidence of ulcer or pre-ulcerative changes or infection. Orthopedic Exam: Muscle tone and strength are WNL. No limitations in general ROM. No crepitus or effusions noted. Foot type and digits show no abnormalities. Bony prominences are unremarkable. Skin: No Porokeratosis. No infection or ulcers  Diagnosis:  Onychomycosis, , Pain in right toe, pain in left toes  Treatment & Plan Procedures and Treatment: Consent by patient was obtained for treatment procedures. The patient understood the discussion of treatment and procedures well. All questions were answered thoroughly reviewed. Debridement of mycotic and hypertrophic toenails, 1 through 5 bilateral and clearing of subungual debris. No ulceration, no infection noted.  Return Visit-Office Procedure: Patient instructed to return to the office for a follow up visit 3 months for continued evaluation and treatment.    Mirka Barbone DPM 

## 2016-04-21 DIAGNOSIS — B351 Tinea unguium: Secondary | ICD-10-CM | POA: Diagnosis not present

## 2016-04-21 DIAGNOSIS — B353 Tinea pedis: Secondary | ICD-10-CM | POA: Diagnosis not present

## 2016-06-24 ENCOUNTER — Ambulatory Visit (INDEPENDENT_AMBULATORY_CARE_PROVIDER_SITE_OTHER): Payer: Medicare Other | Admitting: Podiatry

## 2016-06-24 ENCOUNTER — Encounter: Payer: Self-pay | Admitting: Podiatry

## 2016-06-24 DIAGNOSIS — M79673 Pain in unspecified foot: Secondary | ICD-10-CM

## 2016-06-24 DIAGNOSIS — B351 Tinea unguium: Secondary | ICD-10-CM

## 2016-06-24 DIAGNOSIS — E1142 Type 2 diabetes mellitus with diabetic polyneuropathy: Secondary | ICD-10-CM

## 2016-06-24 NOTE — Progress Notes (Signed)
Patient ID: Lauren Avery, female   DOB: [birth year], 82 y.o.   MRN: [id number] Complaint:  Visit Type: Patient returns to my office for continued preventative foot care services. Complaint: Patient states" my nails have grown long and thick and become painful to walk and wear shoes" Patient has been diagnosed with DM with no foot complications. The patient presents for preventative foot care services. No changes to ROS  Podiatric Exam: Vascular: dorsalis pedis and posterior tibial pulses are palpable bilateral. Capillary return is immediate. Temperature gradient is WNL. Skin turgor WNL  Sensorium: Diminished  Semmes Weinstein monofilament test. Normal tactile sensation bilaterally. Nail Exam: Pt has thick disfigured discolored nails with subungual debris noted bilateral entire nail hallux through fifth toenails Ulcer Exam: There is no evidence of ulcer or pre-ulcerative changes or infection. Orthopedic Exam: Muscle tone and strength are WNL. No limitations in general ROM. No crepitus or effusions noted. Foot type and digits show no abnormalities. Bony prominences are unremarkable. Skin: No Porokeratosis. No infection or ulcers  Diagnosis:  Onychomycosis, , Pain in right toe, pain in left toes  Treatment & Plan Procedures and Treatment: Consent by patient was obtained for treatment procedures. The patient understood the discussion of treatment and procedures well. All questions were answered thoroughly reviewed. Debridement of mycotic and hypertrophic toenails, 1 through 5 bilateral and clearing of subungual debris. No ulceration, no infection noted.  Return Visit-Office Procedure: Patient instructed to return to the office for a follow up visit 3 months for continued evaluation and treatment.    Helane Gunther DPM

## 2016-06-26 DIAGNOSIS — E1129 Type 2 diabetes mellitus with other diabetic kidney complication: Secondary | ICD-10-CM | POA: Diagnosis not present

## 2016-07-17 ENCOUNTER — Other Ambulatory Visit: Payer: Self-pay | Admitting: Pulmonary Disease

## 2016-07-24 DIAGNOSIS — N390 Urinary tract infection, site not specified: Secondary | ICD-10-CM | POA: Diagnosis not present

## 2016-07-24 DIAGNOSIS — I10 Essential (primary) hypertension: Secondary | ICD-10-CM | POA: Diagnosis not present

## 2016-07-24 DIAGNOSIS — R8299 Other abnormal findings in urine: Secondary | ICD-10-CM | POA: Diagnosis not present

## 2016-07-24 DIAGNOSIS — M810 Age-related osteoporosis without current pathological fracture: Secondary | ICD-10-CM | POA: Diagnosis not present

## 2016-07-24 DIAGNOSIS — E1142 Type 2 diabetes mellitus with diabetic polyneuropathy: Secondary | ICD-10-CM | POA: Diagnosis not present

## 2016-07-31 DIAGNOSIS — E1129 Type 2 diabetes mellitus with other diabetic kidney complication: Secondary | ICD-10-CM | POA: Diagnosis not present

## 2016-07-31 DIAGNOSIS — R946 Abnormal results of thyroid function studies: Secondary | ICD-10-CM | POA: Diagnosis not present

## 2016-07-31 DIAGNOSIS — I10 Essential (primary) hypertension: Secondary | ICD-10-CM | POA: Diagnosis not present

## 2016-07-31 DIAGNOSIS — Z6823 Body mass index (BMI) 23.0-23.9, adult: Secondary | ICD-10-CM | POA: Diagnosis not present

## 2016-07-31 DIAGNOSIS — E1142 Type 2 diabetes mellitus with diabetic polyneuropathy: Secondary | ICD-10-CM | POA: Diagnosis not present

## 2016-07-31 DIAGNOSIS — Z0000 Encounter for general adult medical examination without abnormal findings: Secondary | ICD-10-CM | POA: Diagnosis not present

## 2016-07-31 DIAGNOSIS — H259 Unspecified age-related cataract: Secondary | ICD-10-CM | POA: Diagnosis not present

## 2016-07-31 DIAGNOSIS — J8410 Pulmonary fibrosis, unspecified: Secondary | ICD-10-CM | POA: Diagnosis not present

## 2016-07-31 DIAGNOSIS — R05 Cough: Secondary | ICD-10-CM | POA: Diagnosis not present

## 2016-07-31 DIAGNOSIS — N183 Chronic kidney disease, stage 3 (moderate): Secondary | ICD-10-CM | POA: Diagnosis not present

## 2016-08-28 DIAGNOSIS — E119 Type 2 diabetes mellitus without complications: Secondary | ICD-10-CM | POA: Diagnosis not present

## 2016-08-28 DIAGNOSIS — H524 Presbyopia: Secondary | ICD-10-CM | POA: Diagnosis not present

## 2016-09-09 ENCOUNTER — Ambulatory Visit (INDEPENDENT_AMBULATORY_CARE_PROVIDER_SITE_OTHER): Payer: Medicare Other | Admitting: Podiatry

## 2016-09-09 ENCOUNTER — Encounter: Payer: Self-pay | Admitting: Podiatry

## 2016-09-09 VITALS — Ht 63.0 in | Wt 123.0 lb

## 2016-09-09 DIAGNOSIS — B351 Tinea unguium: Secondary | ICD-10-CM

## 2016-09-09 DIAGNOSIS — E1142 Type 2 diabetes mellitus with diabetic polyneuropathy: Secondary | ICD-10-CM | POA: Diagnosis not present

## 2016-09-09 DIAGNOSIS — M79676 Pain in unspecified toe(s): Secondary | ICD-10-CM | POA: Diagnosis not present

## 2016-09-09 NOTE — Progress Notes (Signed)
Patient ID: Lauren Avery, female   DOB: 06/09/1927, 82 y.o.   MRN: [id number] Complaint:  Visit Type: Patient returns to my office for continued preventative foot care services. Complaint: Patient states" my nails have grown long and thick and become painful to walk and wear shoes" Patient has been diagnosed with DM with no foot complications. The patient presents for preventative foot care services. No changes to ROS  Podiatric Exam: Vascular: dorsalis pedis and posterior tibial pulses are palpable bilateral. Capillary return is immediate. Temperature gradient is WNL. Skin turgor WNL  Sensorium: Diminished  Semmes Weinstein monofilament test. Normal tactile sensation bilaterally. Nail Exam: Pt has thick disfigured discolored nails with subungual debris noted bilateral entire nail hallux through fifth toenails Ulcer Exam: There is no evidence of ulcer or pre-ulcerative changes or infection. Orthopedic Exam: Muscle tone and strength are WNL. No limitations in general ROM. No crepitus or effusions noted. Foot type and digits show no abnormalities. Bony prominences are unremarkable. Skin: No Porokeratosis. No infection or ulcers  Diagnosis:  Onychomycosis, , Pain in right toe, pain in left toes  Treatment & Plan Procedures and Treatment: Consent by patient was obtained for treatment procedures. The patient understood the discussion of treatment and procedures well. All questions were answered thoroughly reviewed. Debridement of mycotic and hypertrophic toenails, 1 through 5 bilateral and clearing of subungual debris. No ulceration, no infection noted.  Return Visit-Office Procedure: Patient instructed to return to the office for a follow up visit 3 months for continued evaluation and treatment.    Colbi Staubs DPM 

## 2016-09-23 ENCOUNTER — Ambulatory Visit (INDEPENDENT_AMBULATORY_CARE_PROVIDER_SITE_OTHER): Payer: Medicare Other | Admitting: Pulmonary Disease

## 2016-09-23 ENCOUNTER — Ambulatory Visit (INDEPENDENT_AMBULATORY_CARE_PROVIDER_SITE_OTHER)
Admission: RE | Admit: 2016-09-23 | Discharge: 2016-09-23 | Disposition: A | Discharge status: Home / Self Care | Payer: Medicare Other | Source: Ambulatory Visit | Attending: Pulmonary Disease | Admitting: Pulmonary Disease

## 2016-09-23 ENCOUNTER — Encounter: Payer: Self-pay | Admitting: Pulmonary Disease

## 2016-09-23 VITALS — BP 128/64 | HR 96 | Temp 97.0°F | Ht 62.0 in | Wt 125.0 lb

## 2016-09-23 DIAGNOSIS — R05 Cough: Secondary | ICD-10-CM | POA: Diagnosis not present

## 2016-09-23 DIAGNOSIS — K2100 Gastro-esophageal reflux disease with esophagitis, without bleeding: Secondary | ICD-10-CM

## 2016-09-23 DIAGNOSIS — K219 Gastro-esophageal reflux disease without esophagitis: Secondary | ICD-10-CM | POA: Diagnosis not present

## 2016-09-23 DIAGNOSIS — J84112 Idiopathic pulmonary fibrosis: Secondary | ICD-10-CM

## 2016-09-23 DIAGNOSIS — R053 Chronic cough: Secondary | ICD-10-CM

## 2016-09-23 DIAGNOSIS — J8410 Pulmonary fibrosis, unspecified: Secondary | ICD-10-CM | POA: Diagnosis not present

## 2016-09-23 NOTE — Progress Notes (Signed)
Subjective:     Patient ID: Lauren CaroliMargaret L Bedoya, female   DOB: 19-Sep-1927, 82 y.o.   MRN: [id number]  HPI  ~  March 16, 2014:  [age]yr ROV w/ KC>        The patient comes in today for followup of her known idiopathic pulmonary fibrosis and chronic cough that is felt secondary to upper and lower airway issues. The patient was last seen 2 years ago, and she never followed up with her chest x-ray and pulmonary function studies because of other medical issues that required hospitalizations. She comes in today where her cough continues to be a significant problem, and she feels that her exertional tolerance has declined as well. She is no longer taking her proton pump inhibitor, and is having ongoing reflux symptoms. She is also having a lot of rhinorrhea, and is not taking her antihistamine on a regular basis.      REC>  1) Chronic cough:  The pt continues to have dry cough that I suspect is both upper and lower airway in origin. She is having ongoing reflux and in is no longer taking her PPI, and also is having rhinorrhea. Chronic dry cough is also a hallmark of IPF. I have also stressed to her the importance of reflux treatment in patients with interstitial lung disease, and how it can actually cause progressive disease . It has become standard of care to treat all patients with IPF with a proton pump inhibitor empirically. Will also try cough suppression both during the day and night.  2) IPF:  The patient has idiopathic pulmonary fibrosis, and she has not had recent chest x-ray or PFTs because of other medical issues that needed to be taken care of. She does feel that her breathing is a little worse now than in the past, and I will schedule her for a followup chest x-ray and pulmonary function studies. I do not think she is a great candidate for anti-fibrotic therapy.  ~  March 27, 2014:  2wk ROV w/ KC>        The patient comes in today for followup after her recent chest x-ray and PFTs. She has known  idiopathic pulmonary fibrosis, and her chest x-ray showed progressive disease. Her PFTs compared to 2013 showed a decline in her FVC and T. LC, but her diffusion capacity was actually a little better. The patient continues to have her dry cough, but has recently started to have more of a wet cough that is productive of only white mucus. She does not feel chest congestion. She was having loose stools from her proton pump inhibitor, and this was discontinued. She now has breakthrough reflux symptoms for which she is taking times. She does feel that tramadol helps her dry cough.      REC>  The patient's most recent chest x-ray showed some disease progression, and although her PFTs do show a drop in her .FVC and TLC, her DLCO is actually slightly improved. Again, I do not think she is a very good candidate for the anti-fibrotic agents, especially since they are associated with severe diarrhea. I worry about her frailty going forward. At this point, I think we will continue to watch her, and work on symptom control. I think it is very important that she stays on a proton pump inhibitor, since reflux is extremely common in pulmonary fibrosis patients. Not only does it worsen cough, but it also can cause progressive disease. She did not do well on Prilosec, so we'll  try her on Protonix. I have also asked her to continue on something for postnasal drip As well. She tells me that tramadol did help her cough, and I have asked her to continue on this as needed. Of note, her ambulatory oximetry today on room air did not show desaturation of significance.  ~  September 20, 2015:  72mo ROV w/ SN>  Her PCP is DrTisovec...      82 y/o WF w/ pulm fibrosis, not on meds and treated only w/ Hycodan prn;  She recently noted incr cough, mostly dry but noted more after eating & strenuous activity; cough syrup helps and she notes that Tramadol helped in past; she denies f/c/s, no CP/ palpit/ edema; she does wake some at night coughing;  she denies SOB w/ ADLs etc; DrTisovec recently added ProairHFA prn use...       EXAM shows Afeb, VSS, O2sat=93% on RA;  HEENT- neg, mallampati1;  Chest- velcro rales ~1/2 way up posteriorly & ant as well;  Heart- RR gr1/6 SEM w/o r/g;  Abd- soft, neg;  Ext- neg w/o c/c/e...   CT Chest 02/16/12 at [facility] showed diffuse bilat interstitial fibrosis w/ subpleural honeycombing- progressively worse compared to 2007; 5mm right apical nodule, 4mm medial RLL nodule, small mediastinal nodes, norm heart size & mild calcif atherosclerotic changes, mult healed left rib fxs, mod mid-thoracic compression fx, mult gallstones noted...  PFT 03/16/12 showed FVC=2.02 (88%), FEV1=1.72 (114%), %1sec=85, mid-flows wnl at 108% predicted;  TLC=3.50 (81%), RV=1.48 (79%), RV/TLC=42;  DLCO=58% predicted => c/w mild restriction and mod decr in diffusion...  PFT 03/20/14 showed FVC=1.88 (88%), FEV1=1.59 (101%), %1sec=85, mid-flows wnl at 183% predicted; TLC=3.31 (69%), RV=1.47 (61%), RV/TLC=44;  DLCO=44% predicted => c/w mild to mod restrictive dis w/ severe decr in diffusion capacity...   CXR 03/2014 showed norm heart size, progressive ILD, old compression T8...  CXR 09/20/15 showed norm heart size, diffuse bilat fibrotic changes, thoracic compression T8 & T12...  Ambulatory oxygen saturation test 09/20/15 showed O2sat=99% on RA at rest w/ pulse=86/min; she ambulatedonly 1 lap & stopped w/ fatigue & SOB; lowest O2sat=94% w/ pulse=119/min...   ?she had recent ONO by DrTisovec => we will try to get copy sent to us to review... PLAN>>  Claris CheMargaret has severe pulm fibrosis which creates quite a dilemma in a fragile 82 y/o lady; I agree that she is not a good cand for antifibrotic therapy & also not a good cand for Pred rx given her osteoporosis & mult fxs; Rec to try ADVAIR500- 1 inhalation Bid + her ProairHFA as needed; she has Hycodan and Tramadol to use for the cough; deconditioning is a serious issue for her as she has been way  too sedentary over the yrs- rec to consider pulm rehb, silver sneakers, yoga, etc but she must be careful (no strenuous activ & no falling allowed!).Marland Kitchen..   ~  March 20, 2016:  48mo ROV w/ SN>  Claris CheMargaret has her own ideas about things and is pretty set in her ways at 82 y/o and she tells me on the one hand that her breathing is the same, then on the other hand she notes that she's not taking our meds!  Recall last visit we reviewed her IPF, GERD & LPR, plus her cough & deconditioning> we recommended trial of ADVAIR500- one inhalation Bid, PROAIR-HFA as needed, Hycodan cough syrup and Tramadol for the cough, and a vigorous antireflux regimen w/ Protonix/ NPO after dinner/ elev HOB... She pretty much hasn't done any  of this (much to her daughter's chagrin) so we spent some time reviewing the diagnoses and recommended treatments...    IPF>  Progressive ILD on serial CXR/ scans; he's only been using the Advair500 one puff daily; she is 82 y/o & frail w/ osteoporosis & mult fxs => we discussed trial ADVAIR500Bid...    GERD/ LPR>  She stopped the PPI & never followed the antireflux regimen;  We reviewed the need for Protonix40 taken before dinner, NPO after dinner, elev HOB 6" and she will comply she says...    Cough>  She has Hycodan and Tramadol; explained how antireflux regimen will help as well...    Severe deconditioning>  She has remained sedentary & we discussed the implications for NHP if she doesn't keep moving! Discussed exercise program to improve her stamina...    MEDICAL issues>  HBP, DM, osteoporosis, compression fxs, etc => per DrTisovec... EXAM shows Afeb, VSS, O2sat=97% on RA;  HEENT- neg, mallampati1;  Chest- velcro rales ~1/2 way up posteriorly, no wheezing or rhonchi;  Heart- RR gr1/6 SEM w/o r/g;  Abd- soft, neg;  Ext- neg w/o c/c/e;  Neuro- intact...  IMP/PLAN>>  Marg is asked to get with the program & see if she responds- use the AdvairBid, ProventilHFA prn, follow the vigorous antireflux  regimen, use the cough suppressants as needed & start improving her exercise program...   ~  September 23, 2016:  27mo ROV w/ SN>  Her PCP is DrTisovec & she reports CPX 07/2016 w/ Labs "all ok" she says;  Followed for IPF, age 72, adeq oxygenation- not requiring O2, multifactorial dyspnea, chr cough;  She tends to use the Hycodan Qhs since "it helps me rest" and uses the Tramadol50- 1/2 tab prn days;  She ambulates w/ walker & NOT very active but encouraged to continue walking to build stamina & insure she will be able to continue doing her ADLs w/o assit-- but she is quite set in her ways & appears to NOT be taking her meds as prescribed (eg- only using Advair once daily...    EXAM shows Afeb, VSS, O2sat=97% on RA;  HEENT- neg, mallampati1;  Chest- velcro rales ~1/2 way up posteriorly, no wheezing or rhonchi;  Heart- RR gr1/6 SEM w/o r/g;  Abd- soft, neg;  Ext- neg w/o c/c/e;  Neuro- intact...   CXR 09/23/16 (independently reviewed by me in the PACS system) showed norm heart size, diffuse fibrotic changes bilat- stable, mild compression fx mid Tspine, & severe compreession fx in upper Lspine (both stable)...  IMP/PLAN>>  Marg is 13 & set in her ways- I have rec that she incr the Advair500 to Bid, take the PPI & follow the antireflux regimen, use the Hycodan & Tramadol prn cough; she needs incr exercise/ walking/ chair exercise/ etc...     Past Medical History  Diagnosis  Date  . Osteoporosis >> on RECLAST per DrTisovec   . Pulmonary fibrosis (HCC) >> on Hycodan, Tramadol, ProairHFA prn...   . GERD (gastroesophageal reflux disease) >> on Protonix40 + antireflux regimen   . Hypertension >> on Amlod5 & Cozaar50   . Diabetes mellitus >> on Glimep2mg    . Fracture >> on Norco7,5 & Tramadol50     Left clavicle 12/12 rx w/ sling     left wrist 12/13    Left elbow w/ surg 05/2015 by Domingo Dimes    Past Surgical History:  Procedure Laterality Date  . FEMUR FRACTURE SURGERY      6/08 partial hip replacement  right  . ganglion cyst removal     left wrist 1960  . HIP FRACTURE SURGERY     1984 pin and plate placed right  . HIP SURGERY     pin and plate removed 03/980  . ORIF ELBOW FRACTURE Right 02/18/2013   Procedure: OPEN REDUCTION INTERNAL FIXATION (ORIF) ELBOW/OLECRANON FRACTURE;  Surgeon: Nadara Mustard, MD;  Location: [facility] OR;  Service: Orthopedics;  Laterality: Right;  . ORIF ELBOW FRACTURE Left 05/09/2015   Procedure: OPEN REDUCTION INTERNAL FIXATION (ORIF) LEFT MEDIAL HUMERAL CONDYLE FRACTURE;  Surgeon: Tarry Kos, MD;  Location: [facility] OR;  Service: Orthopedics;  Laterality: Left;    Outpatient Encounter Prescriptions as of 09/23/2016  Medication Sig  . acetaminophen (TYLENOL) 325 MG tablet Take 2 tablets (650 mg total) by mouth every 6 (six) hours as needed. (Patient taking differently: Take 650 mg by mouth every 6 (six) hours as needed for mild pain, fever or headache. )  . ADVAIR DISKUS 500-50 MCG/DOSE AEPB INHALE 1 PUFF BY MOUTH INTO LUNGS TWICE DAILY  . amLODipine (NORVASC) 5 MG tablet Take 5 mg by mouth every other day.   . calcium carbonate (TUMS - DOSED IN MG ELEMENTAL CALCIUM) 500 MG chewable tablet Chew 1 tablet by mouth 2 (two) times daily with a meal.  . glimepiride (AMARYL) 2 MG tablet Take 4 mg by mouth daily before breakfast.   . HYDROcodone-homatropine (HYCODAN) 5-1.5 MG/5ML syrup Take 5 mLs by mouth every 4 (four) hours as needed for cough.  . losartan (COZAAR) 50 MG tablet Take 50 mg by mouth daily.  . Multiple Vitamins-Minerals (MULTIVITAMIN WITH MINERALS) tablet Take 1 tablet by mouth daily.  . pantoprazole (PROTONIX) 40 MG tablet Take 1 tablet (40 mg total) by mouth 2 (two) times daily.  . traMADol (ULTRAM) 50 MG tablet Take 1/2-1 tablet three times daily as directed for cough  . Zoledronic Acid (RECLAST IV) Inject into the vein. yearly   No facility-administered encounter medications on file as of 09/23/2016.     Allergies  Allergen Reactions  . Hydrochlorothiazide  Other (See Comments)    Taken from faxed notes of dr Wylene Simmer (to short stay)  THROMBOCYTOPENIA  . Prilosec [Omeprazole] Diarrhea    Immunization History  Administered Date(s) Administered  . Influenza Split 09/01/2011, 09/15/2012  . Influenza,inj,Quad PF,36+ Mos 08/31/2013, 07/31/2016  . Influenza-Unspecified 09/06/2015  . PPD Test 05/11/2015, 05/14/2015  . Pneumococcal Conjugate-13 07/20/2014  . Pneumococcal Polysaccharide-23 11/11/2007    Current Medications, Allergies, Past Medical History, Past Surgical History, Family History, and Social History were reviewed in [employer] record.   Review of Systems             All symptoms NEG except where BOLDED >>  Constitutional:  F/C/S, fatigue, anorexia, unexpected weight change. HEENT:  HA, visual changes, hearing loss, earache, nasal symptoms, sore throat, mouth sores, hoarseness. Resp:  cough, sputum, hemoptysis; SOB, tightness, wheezing. Cardio:  CP, palpit, DOE, orthopnea, edema. GI:  N/V/D/C, blood in stool; reflux, abd pain, distention, gas. GU:  dysuria, freq, urgency, hematuria, flank pain, voiding difficulty. MS:  joint pain, swelling, tenderness, decr ROM; neck pain, back pain, etc. Neuro:  HA, tremors, seizures, dizziness, syncope, weakness, numbness, gait abn. Skin:  suspicious lesions or skin rash. Heme:  adenopathy, bruising, bleeding. Psyche:  confusion, agitation, sleep disturbance, hallucinations, anxiety, depression suicidal.   Objective:   Physical Exam       Vital Signs:  Reviewed...   General:  WD, petite, 82  y/o WF in NAD; alert & oriented; pleasant & cooperative... HEENT:  [place]/AT; Conjunctiva- pink, Sclera- nonicteric, EOM-wnl, PERRLA, EACs-clear, TMs-wnl; NOSE-clear; THROAT-clear & wnl.  Neck:  Supple w/ fair ROM; no JVD; normal carotid impulses w/o bruits; no thyromegaly or nodules palpated; no lymphadenopathy.  Chest:  Bibasilar velcro rales ~1/3rd to 1/2 the way up the back, no  wheezing/ rhonchi/ consolidation... Heart:  Regular Rhythm; norm S1 & S2 without murmurs, rubs, or gallops detected. Abdomen:  Soft & nontender- no guarding or rebound; normal bowel sounds; no organomegaly or masses palpated. Ext:  Sl decr ROM; without deformities +arthritic changes; no varicose veins, venous insuffic, or edema;  Pulses intact w/o bruits. Neuro:  CNs II-XII intact; motor testing normal; sensory testing normal; gait normal & balance OK. Derm:  No lesions noted; no rash etc. Lymph:  No cervical, supraclavicular, axillary, or inguinal adenopathy palpated.   Assessment:      IMP >>     Diffuse pulmonary fibrosis in an 82 y/o lady> Given her age & co-morbidities I agree that she is not a cand for antifibrotic therapy; neither is she a good cand for Pred trial given her severe osteoporosis & mult fxs; therefore we will start ADVAIR500- 1 inhalation Bid and continue the ProairHFA rescue inhaler prn along w/ the Hycodan and Tramadol prn cough...    Deconditioned and poor exercise tolerance> We discussed this issue & the need for a gradual progressive exercise program- eg PulmRehab vs silver sneakers, [employer], Yoga, etc...    GERD and prob LPR> She needs a vigorous antireflux regimen w/ Protonix40 taken before dinner, NPO after dinner, elev HOB 6" blocks, etc...    HBP> on Amlod & Losar per DrTisovec...    DM> on Glimep per DrTisovec..    Osteoporosis & thoracic compression fxs, mult other fxs (hip, femur, clavicle, wrist, elbow)> on RECLAST + calcium/ MVI/ VitD, Norco, Tramadol...  PLAN >>  09/20/15>   Alexei has severe pulm fibrosis which creates quite a dilemma in a fragile 82 y/o lady; I agree that she is not a good cand for antifibrotic therapy & also not a good cand for Pred rx given her osteoporosis & mult fxs; Rec to try ADVAIR500- 1 inhalation Bid + her ProairHFA as needed; she has Hycodan and Tramadol to use for the cough; deconditioning is a serious issue for her as she  has been way too sedentary over the yrs- rec to consider pulm rehb, silver sneakers, yoga, etc but she must be careful (no strenuous activ & no falling allowed!) 03/20/16>   Marg is asked to get with the program & see if she responds- use the AdvairBid, ProventilHFA prn, follow the vigorous antireflux regimen, use the cough suppressants as needed & start improving her exercise program...  09/23/16>   Marg is 89 & set in her ways- I have rec that she incr the Advair500 to Bid, take the PPI & follow the antireflux regimen, use the Hycodan & Tramadol prn cough; she needs incr exercise/ walking/ chair exercise/ etc     Plan:     Patient's Medications  New Prescriptions   No medications on file  Previous Medications   ACETAMINOPHEN (TYLENOL) 325 MG TABLET    Take 2 tablets (650 mg total) by mouth every 6 (six) hours as needed.   ADVAIR DISKUS 500-50 MCG/DOSE AEPB    INHALE 1 PUFF BY MOUTH INTO LUNGS TWICE DAILY   AMLODIPINE (NORVASC) 5 MG TABLET    Take 5 mg by  mouth every other day.    CALCIUM CARBONATE (TUMS - DOSED IN MG ELEMENTAL CALCIUM) 500 MG CHEWABLE TABLET    Chew 1 tablet by mouth 2 (two) times daily with a meal.   GLIMEPIRIDE (AMARYL) 2 MG TABLET    Take 4 mg by mouth daily before breakfast.    HYDROCODONE-HOMATROPINE (HYCODAN) 5-1.5 MG/5ML SYRUP    Take 5 mLs by mouth every 4 (four) hours as needed for cough.   LOSARTAN (COZAAR) 50 MG TABLET    Take 50 mg by mouth daily.   MULTIPLE VITAMINS-MINERALS (MULTIVITAMIN WITH MINERALS) TABLET    Take 1 tablet by mouth daily.   PANTOPRAZOLE (PROTONIX) 40 MG TABLET    Take 1 tablet (40 mg total) by mouth 2 (two) times daily.   TRAMADOL (ULTRAM) 50 MG TABLET    Take 1/2-1 tablet three times daily as directed for cough   ZOLEDRONIC ACID (RECLAST IV)    Inject into the vein. yearly  Modified Medications   No medications on file  Discontinued Medications   No medications on file

## 2016-09-23 NOTE — Patient Instructions (Signed)
Today we updated your med list in our EPIC system...    Continue your current medications the same...  Continue the [id number]- one inhalation twice daily...  Continue the PROTONIX 40mg  taken about 30 min before dinner in the eve...    Remember the antireflux regimen- do not eat or drink much after dinner & elevate the head of your bed on 6" blocks...  You may continue to use the HYCODAN cough syrup 1/2 to 1 tsp every 4-6H as needed... And the TRAMADOL 50mg  - 1/2 tab up to 3 times daily for cough or discomfort...  Today we checked your follow up CXR...    We will contact you w/ the results when available...   Call for any questions...  Let's plan a follow up visit in 38mo, sooner if needed for problems.Marland Kitchen..Marland Kitchen

## 2016-09-25 DIAGNOSIS — E1129 Type 2 diabetes mellitus with other diabetic kidney complication: Secondary | ICD-10-CM | POA: Diagnosis not present

## 2016-11-28 ENCOUNTER — Ambulatory Visit: Payer: Medicare Other | Admitting: Podiatry

## 2016-12-02 ENCOUNTER — Encounter: Payer: Self-pay | Admitting: Podiatry

## 2016-12-02 ENCOUNTER — Ambulatory Visit (INDEPENDENT_AMBULATORY_CARE_PROVIDER_SITE_OTHER): Payer: Medicare Other | Admitting: Podiatry

## 2016-12-02 VITALS — Ht 62.0 in | Wt 125.0 lb

## 2016-12-02 DIAGNOSIS — B351 Tinea unguium: Secondary | ICD-10-CM

## 2016-12-02 DIAGNOSIS — M79676 Pain in unspecified toe(s): Secondary | ICD-10-CM | POA: Diagnosis not present

## 2016-12-02 DIAGNOSIS — E1142 Type 2 diabetes mellitus with diabetic polyneuropathy: Secondary | ICD-10-CM | POA: Diagnosis not present

## 2016-12-02 NOTE — Progress Notes (Signed)
Patient ID: Lauren Avery, female   DOB: 10/01/1927, 83 y.o.   MRN: [id number] Complaint:  Visit Type: Patient returns to my office for continued preventative foot care services. Complaint: Patient states" my nails have grown long and thick and become painful to walk and wear shoes" Patient has been diagnosed with DM with no foot complications. The patient presents for preventative foot care services. No changes to ROS  Podiatric Exam: Vascular: dorsalis pedis and posterior tibial pulses are palpable bilateral. Capillary return is immediate. Temperature gradient is WNL. Skin turgor WNL  Sensorium: Diminished  Semmes Weinstein monofilament test. Normal tactile sensation bilaterally. Nail Exam: Pt has thick disfigured discolored nails with subungual debris noted bilateral entire nail hallux through fifth toenails Ulcer Exam: There is no evidence of ulcer or pre-ulcerative changes or infection. Orthopedic Exam: Muscle tone and strength are WNL. No limitations in general ROM. No crepitus or effusions noted. Foot type and digits show no abnormalities. Bony prominences are unremarkable. Skin: No Porokeratosis. No infection or ulcers  Diagnosis:  Onychomycosis, , Pain in right toe, pain in left toes  Treatment & Plan Procedures and Treatment: Consent by patient was obtained for treatment procedures. The patient understood the discussion of treatment and procedures well. All questions were answered thoroughly reviewed. Debridement of mycotic and hypertrophic toenails, 1 through 5 bilateral and clearing of subungual debris. No ulceration, no infection noted.  Return Visit-Office Procedure: Patient instructed to return to the office for a follow up visit 3 months for continued evaluation and treatment.    Kylie Gros DPM 

## 2017-01-06 DIAGNOSIS — E1129 Type 2 diabetes mellitus with other diabetic kidney complication: Secondary | ICD-10-CM | POA: Diagnosis not present

## 2017-01-30 DIAGNOSIS — D692 Other nonthrombocytopenic purpura: Secondary | ICD-10-CM | POA: Diagnosis not present

## 2017-01-30 DIAGNOSIS — E1142 Type 2 diabetes mellitus with diabetic polyneuropathy: Secondary | ICD-10-CM | POA: Diagnosis not present

## 2017-01-30 DIAGNOSIS — E1129 Type 2 diabetes mellitus with other diabetic kidney complication: Secondary | ICD-10-CM | POA: Diagnosis not present

## 2017-01-30 DIAGNOSIS — R05 Cough: Secondary | ICD-10-CM | POA: Diagnosis not present

## 2017-03-03 ENCOUNTER — Ambulatory Visit (INDEPENDENT_AMBULATORY_CARE_PROVIDER_SITE_OTHER): Payer: Medicare Other | Admitting: Podiatry

## 2017-03-03 ENCOUNTER — Encounter: Payer: Self-pay | Admitting: Podiatry

## 2017-03-03 VITALS — Ht 62.0 in | Wt 125.0 lb

## 2017-03-03 DIAGNOSIS — E1142 Type 2 diabetes mellitus with diabetic polyneuropathy: Secondary | ICD-10-CM

## 2017-03-03 DIAGNOSIS — M79676 Pain in unspecified toe(s): Secondary | ICD-10-CM

## 2017-03-03 DIAGNOSIS — B351 Tinea unguium: Secondary | ICD-10-CM | POA: Diagnosis not present

## 2017-03-03 NOTE — Progress Notes (Signed)
Patient ID: Lauren Avery, female   DOB: [birth year], 83 y.o.   MRN: [id number] Complaint:  Visit Type: Patient returns to my office for continued preventative foot care services. Complaint: Patient states" my nails have grown long and thick and become painful to walk and wear shoes" Patient has been diagnosed with DM with no foot complications. The patient presents for preventative foot care services. No changes to ROS  Podiatric Exam: Vascular: dorsalis pedis and posterior tibial pulses are palpable bilateral. Capillary return is immediate. Temperature gradient is WNL. Skin turgor WNL  Sensorium: Diminished  Semmes Weinstein monofilament test. Normal tactile sensation bilaterally. Nail Exam: Pt has thick disfigured discolored nails with subungual debris noted bilateral entire nail hallux through fifth toenails Ulcer Exam: There is no evidence of ulcer or pre-ulcerative changes or infection. Orthopedic Exam: Muscle tone and strength are WNL. No limitations in general ROM. No crepitus or effusions noted. Foot type and digits show no abnormalities. Bony prominences are unremarkable. Skin: No Porokeratosis. No infection or ulcers  Diagnosis:  Onychomycosis, , Pain in right toe, pain in left toes  Treatment & Plan Procedures and Treatment: Consent by patient was obtained for treatment procedures. The patient understood the discussion of treatment and procedures well. All questions were answered thoroughly reviewed. Debridement of mycotic and hypertrophic toenails, 1 through 5 bilateral and clearing of subungual debris. No ulceration, no infection noted.  Return Visit-Office Procedure: Patient instructed to return to the office for a follow up visit 3 months for continued evaluation and treatment.    Helane Gunther DPM

## 2017-03-24 ENCOUNTER — Ambulatory Visit (INDEPENDENT_AMBULATORY_CARE_PROVIDER_SITE_OTHER): Payer: Medicare Other | Admitting: Pulmonary Disease

## 2017-03-24 ENCOUNTER — Encounter: Payer: Self-pay | Admitting: Pulmonary Disease

## 2017-03-24 VITALS — BP 122/64 | HR 80 | Temp 97.0°F | Ht 62.0 in | Wt 121.4 lb

## 2017-03-24 DIAGNOSIS — K219 Gastro-esophageal reflux disease without esophagitis: Secondary | ICD-10-CM

## 2017-03-24 DIAGNOSIS — J84112 Idiopathic pulmonary fibrosis: Secondary | ICD-10-CM | POA: Diagnosis not present

## 2017-03-24 DIAGNOSIS — R05 Cough: Secondary | ICD-10-CM | POA: Diagnosis not present

## 2017-03-24 DIAGNOSIS — R5381 Other malaise: Secondary | ICD-10-CM

## 2017-03-24 DIAGNOSIS — R053 Chronic cough: Secondary | ICD-10-CM

## 2017-03-24 MED ORDER — TRAMADOL HCL 50 MG PO TABS: ORAL_TABLET | ORAL | 5 refills | Status: DC

## 2017-03-24 NOTE — Progress Notes (Signed)
Subjective:     Patient ID: Lauren Avery, female   DOB: August 03, 1927, 83 y.o.   MRN: [id number]  HPI  ~  March 16, 2014:  72yr ROV w/ KC>        The patient comes in today for followup of her known idiopathic pulmonary fibrosis and chronic cough that is felt secondary to upper and lower airway issues. The patient was last seen 2 years ago, and she never followed up with her chest x-ray and pulmonary function studies because of other medical issues that required hospitalizations. She comes in today where her cough continues to be a significant problem, and she feels that her exertional tolerance has declined as well. She is no longer taking her proton pump inhibitor, and is having ongoing reflux symptoms. She is also having a lot of rhinorrhea, and is not taking her antihistamine on a regular basis.      REC>  1) Chronic cough:  The pt continues to have dry cough that I suspect is both upper and lower airway in origin. She is having ongoing reflux and in is no longer taking her PPI, and also is having rhinorrhea. Chronic dry cough is also a hallmark of IPF. I have also stressed to her the importance of reflux treatment in patients with interstitial lung disease, and how it can actually cause progressive disease . It has become standard of care to treat all patients with IPF with a proton pump inhibitor empirically. Will also try cough suppression both during the day and night.  2) IPF:  The patient has idiopathic pulmonary fibrosis, and she has not had recent chest x-ray or PFTs because of other medical issues that needed to be taken care of. She does feel that her breathing is a little worse now than in the past, and I will schedule her for a followup chest x-ray and pulmonary function studies. I do not think she is a great candidate for anti-fibrotic therapy.  ~  March 27, 2014:  2wk ROV w/ KC>        The patient comes in today for followup after her recent chest x-ray and PFTs. She has known  idiopathic pulmonary fibrosis, and her chest x-ray showed progressive disease. Her PFTs compared to 2013 showed a decline in her FVC and T. LC, but her diffusion capacity was actually a little better. The patient continues to have her dry cough, but has recently started to have more of a wet cough that is productive of only white mucus. She does not feel chest congestion. She was having loose stools from her proton pump inhibitor, and this was discontinued. She now has breakthrough reflux symptoms for which she is taking times. She does feel that tramadol helps her dry cough.      REC>  The patient's most recent chest x-ray showed some disease progression, and although her PFTs do show a drop in her .FVC and TLC, her DLCO is actually slightly improved. Again, I do not think she is a very good candidate for the anti-fibrotic agents, especially since they are associated with severe diarrhea. I worry about her frailty going forward. At this point, I think we will continue to watch her, and work on symptom control. I think it is very important that she stays on a proton pump inhibitor, since reflux is extremely common in pulmonary fibrosis patients. Not only does it worsen cough, but it also can cause progressive disease. She did not do well on Prilosec, so we'll  try her on Protonix. I have also asked her to continue on something for postnasal drip As well. She tells me that tramadol did help her cough, and I have asked her to continue on this as needed. Of note, her ambulatory oximetry today on room air did not show desaturation of significance.  ~  September 20, 2015:  72mo ROV w/ SN>  Her PCP is DrTisovec...      [name] w/ pulm fibrosis, not on meds and treated only w/ Hycodan prn;  She recently noted incr cough, mostly dry but noted more after eating & strenuous activity; cough syrup helps and she notes that Tramadol helped in past; she denies f/c/s, no CP/ palpit/ edema; she does wake some at night coughing;  she denies SOB w/ ADLs etc; DrTisovec recently added ProairHFA prn use...       EXAM shows Afeb, VSS, O2sat=93% on RA;  HEENT- neg, mallampati1;  Chest- velcro rales ~1/2 way up posteriorly & ant as well;  Heart- RR gr1/6 SEM w/o r/g;  Abd- soft, neg;  Ext- neg w/o c/c/e...   CT Chest 02/16/12 at [facility] showed diffuse bilat interstitial fibrosis w/ subpleural honeycombing- progressively worse compared to 2007; 5mm right apical nodule, 4mm medial RLL nodule, small mediastinal nodes, norm heart size & mild calcif atherosclerotic changes, mult healed left rib fxs, mod mid-thoracic compression fx, mult gallstones noted...  PFT 03/16/12 showed FVC=2.02 (88%), FEV1=1.72 (114%), %1sec=85, mid-flows wnl at 108% predicted;  TLC=3.50 (81%), RV=1.48 (79%), RV/TLC=42;  DLCO=58% predicted => c/w mild restriction and mod decr in diffusion...  PFT 03/20/14 showed FVC=1.88 (88%), FEV1=1.59 (101%), %1sec=85, mid-flows wnl at 183% predicted; TLC=3.31 (69%), RV=1.47 (61%), RV/TLC=44;  DLCO=44% predicted => c/w mild to mod restrictive dis w/ severe decr in diffusion capacity...   CXR 03/2014 showed norm heart size, progressive ILD, old compression T8...  CXR 09/20/15 showed norm heart size, diffuse bilat fibrotic changes, thoracic compression T8 & T12...  Ambulatory oxygen saturation test 09/20/15 showed O2sat=99% on RA at rest w/ pulse=86/min; she ambulatedonly 1 lap & stopped w/ fatigue & SOB; lowest O2sat=94% w/ pulse=119/min...   ?she had recent ONO by DrTisovec => we will try to get copy sent to us to review... PLAN>>  Lauren Avery has severe pulm fibrosis which creates quite a dilemma in a fragile 83 y/o lady; I agree that she is not a good cand for antifibrotic therapy & also not a good cand for Pred rx given her osteoporosis & mult fxs; Rec to try ADVAIR500- 1 inhalation Bid + her ProairHFA as needed; she has Hycodan and Tramadol to use for the cough; deconditioning is a serious issue for her as she has been way  too sedentary over the yrs- rec to consider pulm rehb, silver sneakers, yoga, etc but she must be careful (no strenuous activ & no falling allowed!).Marland Kitchen..   ~  March 20, 2016:  48mo ROV w/ SN>  Lauren Avery has her own ideas about things and is pretty set in her ways at 83 y/o and she tells me on the one hand that her breathing is the same, then on the other hand she notes that she's not taking our meds!  Recall last visit we reviewed her IPF, GERD & LPR, plus her cough & deconditioning> we recommended trial of ADVAIR500- one inhalation Bid, PROAIR-HFA as needed, Hycodan cough syrup and Tramadol for the cough, and a vigorous antireflux regimen w/ Protonix/ NPO after dinner/ elev HOB... She pretty much hasn't done any  of this (much to her daughter's chagrin) so we spent some time reviewing the diagnoses and recommended treatments...    IPF>  Progressive ILD on serial CXR/ scans; he's only been using the Advair500 one puff daily; she is 83 y/o & frail w/ osteoporosis & mult fxs => we discussed trial ADVAIR500Bid...    GERD/ LPR>  She stopped the PPI & never followed the antireflux regimen;  We reviewed the need for Protonix40 taken before dinner, NPO after dinner, elev HOB 6" and she will comply she says...    Cough>  She has Hycodan and Tramadol; explained how antireflux regimen will help as well...    Severe deconditioning>  She has remained sedentary & we discussed the implications for NHP if she doesn't keep moving! Discussed exercise program to improve her stamina...    MEDICAL issues>  HBP, DM, osteoporosis, compression fxs, etc => per DrTisovec... EXAM shows Afeb, VSS, O2sat=97% on RA;  HEENT- neg, mallampati1;  Chest- velcro rales ~1/2 way up posteriorly, no wheezing or rhonchi;  Heart- RR gr1/6 SEM w/o r/g;  Abd- soft, neg;  Ext- neg w/o c/c/e;  Neuro- intact...  IMP/PLAN>>  Lauren Avery is asked to get with the program & see if she responds- use the AdvairBid, ProventilHFA prn, follow the vigorous antireflux  regimen, use the cough suppressants as needed & start improving her exercise program...   ~  September 23, 2016:  68mo ROV w/ SN>  Her PCP is DrTisovec & she reports CPX 07/2016 w/ Labs "all ok" she says;  Followed for IPF, age 16, adeq oxygenation- not requiring O2, multifactorial dyspnea, chr cough;  She tends to use the Hycodan Qhs since "it helps me rest" and uses the Tramadol50- 1/2 tab prn days;  She ambulates w/ walker & NOT very active but encouraged to continue walking to build stamina & insure she will be able to continue doing her ADLs w/o assit-- but she is quite set in her ways & appears to NOT be taking her meds as prescribed (eg- only using Advair once daily...    EXAM shows Afeb, VSS, O2sat=97% on RA;  HEENT- neg, mallampati1;  Chest- velcro rales ~1/2 way up posteriorly, no wheezing or rhonchi;  Heart- RR gr1/6 SEM w/o r/g;  Abd- soft, neg;  Ext- neg w/o c/c/e;  Neuro- intact...   CXR 09/23/16 (independently reviewed by me in the PACS system) showed norm heart size, diffuse fibrotic changes bilat- stable, mild compression fx mid Tspine, & severe compreession fx in upper Lspine (both stable)...  IMP/PLAN>>  Lauren Avery is 7 & set in her ways- I have rec that she incr the Advair500 to Bid, take the PPI & follow the antireflux regimen, use the Hycodan & Tramadol prn cough; she needs incr exercise/ walking/ chair exercise/ etc...    ~  March 24, 2017:  68mo ROV w/ SN>  Lauren Avery is reasonably stable w/ her pulm fibrosis, mild cough (esp after eating & w/ exercise), and notes some back discomfort;  She has Advair500- supposed to be doing one inhalation Bid but only doing it once per day (her pref), and Tramadol50  Up to Bid as needed for pain & cough (told to take w/ Tylenol to boost it's effect)...  we reviewed the following medical problems during today's office visit >>     IPF>  Progressive ILD on serial CXR/ scans; he's only been using the Advair500 one puff daily; she is 83 y/o & frail w/ osteoporosis &  mult fxs =>  we discussed taking the ADVAIR500Bid & use Tramadol + OTCs prn cough...    GERD/ LPR>  She stopped the PPI & never followed the antireflux regimen;  We reviewed the need for Protonix40 taken before dinner, NPO after dinner, elev HOB 6" but she never complied!    Cough>  She has Hycodan and Tramadol; explained how antireflux regimen will help as well...    Severe deconditioning>  She has remained sedentary & we discussed the implications for NHP if she doesn't keep moving! Discussed exercise program to improve her stamina...    MEDICAL issues>  HBP (onAmlod5, Losar50), DM (on Glimep2), osteoporosis (on Reclast yrly), compression fxs, etc => per DrTisovec... EXAM shows Afeb, VSS, O2sat=96% on RA;  HEENT- neg, mallampati1;  Chest- velcro rales ~1/2 way up posteriorly (no change), no wheezing or rhonchi;  Heart- RR gr1/6 SEM w/o r/g;  Abd- soft, neg;  Ext- neg w/o c/c/e...   Ambulatory Oximetery 03/24/17>  O2sat=98% on RA at rest w/ pulse=92/min;  She ambulated 2 laps in office (185'ea) w/ lowest O2sat=94% w/ pulse ~114/min...  ONO performed 04/10/17 on RA & showed O2sat zenith of 97%, nadir of 86%, Ave of 94%;  She spent only 12 sec <88% saturation & does not qualify for Home O2... IMP/PLAN>>  Lauren Avery is asked to incr the Advair500 to Bid; OK to use the Tramadol prn back discomfort or cough & can add Tylenol to potentiate it's effect;  She still needs to consider my rec for incr exercise, better conditioning!  We will maintain Q55mo rov & sooner if needed for breathing problems...    Past Medical History  Diagnosis  Date  . Osteoporosis >> on RECLAST per DrTisovec   . Pulmonary fibrosis (HCC) >> on Hycodan, Tramadol, ProairHFA prn...   . GERD (gastroesophageal reflux disease) >> prev on Protonix40 but she stopped on her own & never embraced the antireflux regimen   . Hypertension >> on Amlod5 & Cozaar50   . Diabetes mellitus >> on Glimep2mg    . Fracture >> off Norco7,5 & on  Tramadol50     Left clavicle 12/12 rx w/ sling     left wrist 12/13    Left elbow w/ surg 05/2015 by Domingo Dimes    Past Surgical History:  Procedure Laterality Date  . FEMUR FRACTURE SURGERY      6/08 partial hip replacement right  . ganglion cyst removal     left wrist 1960  . HIP FRACTURE SURGERY     1984 pin and plate placed right  . HIP SURGERY     pin and plate removed 12/6107  . ORIF ELBOW FRACTURE Right 02/18/2013   Procedure: OPEN REDUCTION INTERNAL FIXATION (ORIF) ELBOW/OLECRANON FRACTURE;  Surgeon: Nadara Mustard, MD;  Location: [facility] OR;  Service: Orthopedics;  Laterality: Right;  . ORIF ELBOW FRACTURE Left 05/09/2015   Procedure: OPEN REDUCTION INTERNAL FIXATION (ORIF) LEFT MEDIAL HUMERAL CONDYLE FRACTURE;  Surgeon: Tarry Kos, MD;  Location: [facility] OR;  Service: Orthopedics;  Laterality: Left;    Outpatient Encounter Prescriptions as of 03/24/2017  Medication Sig  . acetaminophen (TYLENOL) 325 MG tablet Take 2 tablets (650 mg total) by mouth every 6 (six) hours as needed. (Patient taking differently: Take 650 mg by mouth every 6 (six) hours as needed for mild pain, fever or headache. )  . ADVAIR DISKUS 500-50 MCG/DOSE AEPB INHALE 1 PUFF BY MOUTH INTO LUNGS TWICE DAILY (Patient taking differently: INHALE 1 PUFF BY MOUTH INTO LUNGS DAILY)  .  amLODipine (NORVASC) 5 MG tablet Take 5 mg by mouth every other day.   Marland Kitchen glimepiride (AMARYL) 2 MG tablet Take 4 mg by mouth daily before breakfast.   . losartan (COZAAR) 50 MG tablet Take 50 mg by mouth daily.  . traMADol (ULTRAM) 50 MG tablet Take 1/2-1 tablet three times daily as directed for cough  . Zoledronic Acid (RECLAST IV) Inject into the vein. yearly  . [DISCONTINUED] traMADol (ULTRAM) 50 MG tablet Take 1/2-1 tablet three times daily as directed for cough  . calcium carbonate (TUMS - DOSED IN MG ELEMENTAL CALCIUM) 500 MG chewable tablet Chew 1 tablet by mouth 2 (two) times daily with a meal.  . . Multiple Vitamins-Minerals (MULTIVITAMIN WITH  MINERALS) tablet Take 1 tablet by mouth daily. (Patient not taking: Reported on 03/24/2017)  .  No facility-administered encounter medications on file as of 03/24/2017.     Allergies  Allergen Reactions  . Hydrochlorothiazide Other (See Comments)    Taken from faxed notes of dr Wylene Simmer (to short stay)  THROMBOCYTOPENIA  . Prilosec [Omeprazole] Diarrhea    Immunization History  Administered Date(s) Administered  . Influenza Split 09/01/2011, 09/15/2012  . Influenza,inj,Quad PF,36+ Mos 08/31/2013, 07/31/2016  . Influenza-Unspecified 09/06/2015  . PPD Test 05/11/2015, 05/14/2015  . Pneumococcal Conjugate-13 07/20/2014  . Pneumococcal Polysaccharide-23 11/11/2007    Current Medications, Allergies, Past Medical History, Past Surgical History, Family History, and Social History were reviewed in [employer] record.   Review of Systems             All symptoms NEG except where BOLDED >>  Constitutional:  F/C/S, fatigue, anorexia, unexpected weight change. HEENT:  HA, visual changes, hearing loss, earache, nasal symptoms, sore throat, mouth sores, hoarseness. Resp:  cough, sputum, hemoptysis; SOB, tightness, wheezing. Cardio:  CP, palpit, DOE, orthopnea, edema. GI:  N/V/D/C, blood in stool; reflux, abd pain, distention, gas. GU:  dysuria, freq, urgency, hematuria, flank pain, voiding difficulty. MS:  joint pain, swelling, tenderness, decr ROM; neck pain, back pain, etc. Neuro:  HA, tremors, seizures, dizziness, syncope, weakness, numbness, gait abn. Skin:  suspicious lesions or skin rash. Heme:  adenopathy, bruising, bleeding. Psyche:  confusion, agitation, sleep disturbance, hallucinations, anxiety, depression suicidal.   Objective:   Physical Exam       Vital Signs:  Reviewed...   General:  WD, petite, [name] in NAD; alert & oriented; pleasant & cooperative... HEENT:  [place]/AT; Conjunctiva- pink, Sclera- nonicteric, EOM-wnl, PERRLA, EACs-clear, TMs-wnl;  NOSE-clear; THROAT-clear & wnl.  Neck:  Supple w/ fair ROM; no JVD; normal carotid impulses w/o bruits; no thyromegaly or nodules palpated; no lymphadenopathy.  Chest:  Bibasilar velcro rales ~1/3rd to 1/2 the way up the back, no wheezing/ rhonchi/ consolidation... Heart:  Regular Rhythm; norm S1 & S2 without murmurs, rubs, or gallops detected. Abdomen:  Soft & nontender- no guarding or rebound; normal bowel sounds; no organomegaly or masses palpated. Ext:  Sl decr ROM; without deformities +arthritic changes; no varicose veins, venous insuffic, or edema;  Pulses intact w/o bruits. Neuro:  CNs II-XII intact; motor testing normal; sensory testing normal; gait normal & balance OK. Derm:  No lesions noted; no rash etc. Lymph:  No cervical, supraclavicular, axillary, or inguinal adenopathy palpated.   Assessment:      IMP >>     Diffuse pulmonary fibrosis in an 83 y/o lady> Given her age & co-morbidities I agree that she is not a cand for antifibrotic therapy; neither is she a good cand  for Pred trial given her severe osteoporosis & mult fxs; therefore we will start ADVAIR500- 1 inhalation Bid and continue the ProairHFA rescue inhaler prn along w/ the Hycodan and Tramadol prn cough...    Deconditioned and poor exercise tolerance> We discussed this issue & the need for a gradual progressive exercise program- eg PulmRehab vs silver sneakers, [employer], Yoga, etc...    GERD and prob LPR> She needs a vigorous antireflux regimen w/ Protonix40 taken before dinner, NPO after dinner, elev HOB 6" blocks, etc...    HBP> on Amlod & Losar per DrTisovec...    DM> on Glimep per DrTisovec..    Osteoporosis & thoracic compression fxs, mult other fxs (hip, femur, clavicle, wrist, elbow)> on RECLAST + calcium/ MVI/ VitD, Norco, Tramadol...  PLAN >>  09/20/15>   Shawndrea has severe pulm fibrosis which creates quite a dilemma in a fragile 83 y/o lady; I agree that she is not a good cand for antifibrotic therapy &  also not a good cand for Pred rx given her osteoporosis & mult fxs; Rec to try ADVAIR500- 1 inhalation Bid + her ProairHFA as needed; she has Hycodan and Tramadol to use for the cough; deconditioning is a serious issue for her as she has been way too sedentary over the yrs- rec to consider pulm rehb, silver sneakers, yoga, etc but she must be careful (no strenuous activ & no falling allowed!) 03/20/16>   Lauren Avery is asked to get with the program & see if she responds- use the AdvairBid, ProventilHFA prn, follow the vigorous antireflux regimen, use the cough suppressants as needed & start improving her exercise program...  09/23/16>   Lauren Avery is 89 & set in her ways- I have rec that she incr the Advair500 to Bid, take the PPI & follow the antireflux regimen, use the Hycodan & Tramadol prn cough; she needs incr exercise/ walking/ chair exercise/ etc 03/24/17>   Lauren Avery is asked to incr the Advair500 to Bid; OK to use the Tramadol prn back discomfort or cough & can add Tylenol to potentiate it's effect;  She still needs to consider my rec for incr exercise, better conditioning!  We will maintain Q76mo rov & sooner if needed for breathing problems     Plan:     Patient's Medications  New Prescriptions   No medications on file  Previous Medications   ACETAMINOPHEN (TYLENOL) 325 MG TABLET    Take 2 tablets (650 mg total) by mouth every 6 (six) hours as needed.   ADVAIR DISKUS 500-50 MCG/DOSE AEPB    INHALE 1 PUFF BY MOUTH INTO LUNGS TWICE DAILY   AMLODIPINE (NORVASC) 5 MG TABLET    Take 5 mg by mouth every other day.    CALCIUM CARBONATE (TUMS - DOSED IN MG ELEMENTAL CALCIUM) 500 MG CHEWABLE TABLET    Chew 1 tablet by mouth 2 (two) times daily with a meal.   GLIMEPIRIDE (AMARYL) 2 MG TABLET    Take 4 mg by mouth daily before breakfast.    HYDROCODONE-HOMATROPINE (HYCODAN) 5-1.5 MG/5ML SYRUP    Take 5 mLs by mouth every 4 (four) hours as needed for cough.   LOSARTAN (COZAAR) 50 MG TABLET    Take 50 mg by mouth daily.    MULTIPLE VITAMINS-MINERALS (MULTIVITAMIN WITH MINERALS) TABLET    Take 1 tablet by mouth daily.   PANTOPRAZOLE (PROTONIX) 40 MG TABLET    Take 1 tablet (40 mg total) by mouth 2 (two) times daily.   ZOLEDRONIC ACID (RECLAST IV)  Inject into the vein. yearly  Modified Medications   Modified Medication Previous Medication   TRAMADOL (ULTRAM) 50 MG TABLET traMADol (ULTRAM) 50 MG tablet      Take 1/2-1 tablet three times daily as directed for cough    Take 1/2-1 tablet three times daily as directed for cough  Discontinued Medications   No medications on file

## 2017-03-24 NOTE — Patient Instructions (Signed)
Today we updated your med list in our EPIC system...    Continue your current medications the same...    Try to take the ADVAIR TWICE daily...  Topday we checked an ambulatory oximetry test & we will arrange for an overnight oximetry test at home...    Both are designed to detect low oxygen levels (w/ exercise or while sleeping)...    We will contact you w/ the results when available...   We discussed trying the TRAMADOL 1/2 tab up to 3 times daily for your cough & discomfort (especially before bedtime).  Stay as active as possible...  Call for any questions...  Let's plan a follow up visit in 3mo, sooner if needed for problems.Marland KitchenMarland Kitchen

## 2017-04-08 DIAGNOSIS — E1129 Type 2 diabetes mellitus with other diabetic kidney complication: Secondary | ICD-10-CM | POA: Diagnosis not present

## 2017-04-09 DIAGNOSIS — I10 Essential (primary) hypertension: Secondary | ICD-10-CM | POA: Diagnosis not present

## 2017-04-09 DIAGNOSIS — R0689 Other abnormalities of breathing: Secondary | ICD-10-CM | POA: Diagnosis not present

## 2017-04-09 DIAGNOSIS — Z4789 Encounter for other orthopedic aftercare: Secondary | ICD-10-CM | POA: Diagnosis not present

## 2017-04-10 DIAGNOSIS — R0689 Other abnormalities of breathing: Secondary | ICD-10-CM | POA: Diagnosis not present

## 2017-04-10 DIAGNOSIS — Z4789 Encounter for other orthopedic aftercare: Secondary | ICD-10-CM | POA: Diagnosis not present

## 2017-04-10 DIAGNOSIS — I10 Essential (primary) hypertension: Secondary | ICD-10-CM | POA: Diagnosis not present

## 2017-05-04 ENCOUNTER — Other Ambulatory Visit: Payer: Self-pay | Admitting: Dermatology

## 2017-05-04 DIAGNOSIS — L821 Other seborrheic keratosis: Secondary | ICD-10-CM | POA: Diagnosis not present

## 2017-05-04 DIAGNOSIS — L820 Inflamed seborrheic keratosis: Secondary | ICD-10-CM | POA: Diagnosis not present

## 2017-05-04 DIAGNOSIS — D229 Melanocytic nevi, unspecified: Secondary | ICD-10-CM | POA: Diagnosis not present

## 2017-05-04 DIAGNOSIS — D492 Neoplasm of unspecified behavior of bone, soft tissue, and skin: Secondary | ICD-10-CM | POA: Diagnosis not present

## 2017-06-10 ENCOUNTER — Encounter: Payer: Self-pay | Admitting: Podiatry

## 2017-06-10 ENCOUNTER — Ambulatory Visit (INDEPENDENT_AMBULATORY_CARE_PROVIDER_SITE_OTHER): Payer: Medicare Other | Admitting: Podiatry

## 2017-06-10 DIAGNOSIS — E1142 Type 2 diabetes mellitus with diabetic polyneuropathy: Secondary | ICD-10-CM

## 2017-06-10 DIAGNOSIS — B351 Tinea unguium: Secondary | ICD-10-CM | POA: Diagnosis not present

## 2017-06-10 DIAGNOSIS — M79676 Pain in unspecified toe(s): Secondary | ICD-10-CM | POA: Diagnosis not present

## 2017-06-10 NOTE — Progress Notes (Signed)
Patient ID: Lauren Avery, female   DOB: 07/07/1927, 83 y.o.   MRN: [id number] Complaint:  Visit Type: Patient returns to my office for continued preventative foot care services. Complaint: Patient states" my nails have grown long and thick and become painful to walk and wear shoes" Patient has been diagnosed with DM with no foot complications. The patient presents for preventative foot care services. No changes to ROS  Podiatric Exam: Vascular: dorsalis pedis and posterior tibial pulses are palpable bilateral. Capillary return is immediate. Temperature gradient is WNL. Skin turgor WNL  Sensorium: Diminished  Semmes Weinstein monofilament test. Normal tactile sensation bilaterally. Nail Exam: Pt has thick disfigured discolored nails with subungual debris noted bilateral entire nail hallux through fifth toenails Ulcer Exam: There is no evidence of ulcer or pre-ulcerative changes or infection. Orthopedic Exam: Muscle tone and strength are WNL. No limitations in general ROM. No crepitus or effusions noted. Foot type and digits show no abnormalities. Bony prominences are unremarkable. Skin: No Porokeratosis. No infection or ulcers  Diagnosis:  Onychomycosis, , Pain in right toe, pain in left toes  Treatment & Plan Procedures and Treatment: Consent by patient was obtained for treatment procedures. The patient understood the discussion of treatment and procedures well. All questions were answered thoroughly reviewed. Debridement of mycotic and hypertrophic toenails, 1 through 5 bilateral and clearing of subungual debris. No ulceration, no infection noted.  Return Visit-Office Procedure: Patient instructed to return to the office for a follow up visit 3 months for continued evaluation and treatment.    Helane GuntherGregory Rachell Druckenmiller DPM

## 2017-07-06 DIAGNOSIS — E1129 Type 2 diabetes mellitus with other diabetic kidney complication: Secondary | ICD-10-CM | POA: Diagnosis not present

## 2017-07-29 ENCOUNTER — Other Ambulatory Visit: Payer: Self-pay | Admitting: Pulmonary Disease

## 2017-08-07 DIAGNOSIS — N183 Chronic kidney disease, stage 3 (moderate): Secondary | ICD-10-CM | POA: Diagnosis not present

## 2017-08-07 DIAGNOSIS — N390 Urinary tract infection, site not specified: Secondary | ICD-10-CM | POA: Diagnosis not present

## 2017-08-07 DIAGNOSIS — M810 Age-related osteoporosis without current pathological fracture: Secondary | ICD-10-CM | POA: Diagnosis not present

## 2017-08-07 DIAGNOSIS — I10 Essential (primary) hypertension: Secondary | ICD-10-CM | POA: Diagnosis not present

## 2017-08-07 DIAGNOSIS — R946 Abnormal results of thyroid function studies: Secondary | ICD-10-CM | POA: Diagnosis not present

## 2017-08-14 DIAGNOSIS — Z1212 Encounter for screening for malignant neoplasm of rectum: Secondary | ICD-10-CM | POA: Diagnosis not present

## 2017-09-10 DIAGNOSIS — E1142 Type 2 diabetes mellitus with diabetic polyneuropathy: Secondary | ICD-10-CM | POA: Diagnosis not present

## 2017-09-10 DIAGNOSIS — Z0000 Encounter for general adult medical examination without abnormal findings: Secondary | ICD-10-CM | POA: Diagnosis not present

## 2017-09-10 DIAGNOSIS — E1129 Type 2 diabetes mellitus with other diabetic kidney complication: Secondary | ICD-10-CM | POA: Diagnosis not present

## 2017-09-10 DIAGNOSIS — Z23 Encounter for immunization: Secondary | ICD-10-CM | POA: Diagnosis not present

## 2017-09-10 DIAGNOSIS — M810 Age-related osteoporosis without current pathological fracture: Secondary | ICD-10-CM | POA: Diagnosis not present

## 2017-09-10 DIAGNOSIS — E119 Type 2 diabetes mellitus without complications: Secondary | ICD-10-CM | POA: Diagnosis not present

## 2017-09-10 DIAGNOSIS — E1165 Type 2 diabetes mellitus with hyperglycemia: Secondary | ICD-10-CM | POA: Diagnosis not present

## 2017-09-11 DIAGNOSIS — H524 Presbyopia: Secondary | ICD-10-CM | POA: Diagnosis not present

## 2017-09-16 ENCOUNTER — Ambulatory Visit (INDEPENDENT_AMBULATORY_CARE_PROVIDER_SITE_OTHER): Payer: Medicare Other | Admitting: Podiatry

## 2017-09-16 ENCOUNTER — Encounter: Payer: Self-pay | Admitting: Podiatry

## 2017-09-16 DIAGNOSIS — M79676 Pain in unspecified toe(s): Secondary | ICD-10-CM | POA: Diagnosis not present

## 2017-09-16 DIAGNOSIS — B351 Tinea unguium: Secondary | ICD-10-CM | POA: Diagnosis not present

## 2017-09-16 DIAGNOSIS — E1142 Type 2 diabetes mellitus with diabetic polyneuropathy: Secondary | ICD-10-CM

## 2017-09-16 NOTE — Progress Notes (Signed)
Patient ID: Lauren Avery, female   DOB: 1926/12/09, 83 y.o.   MRN: [id number] Complaint:  Visit Type: Patient returns to my office for continued preventative foot care services. Complaint: Patient states" my nails have grown long and thick and become painful to walk and wear shoes" Patient has been diagnosed with DM with no foot complications. The patient presents for preventative foot care services. No changes to ROS  Podiatric Exam: Vascular: dorsalis pedis and posterior tibial pulses are palpable bilateral. Capillary return is immediate. Temperature gradient is WNL. Skin turgor WNL  Sensorium: Diminished  Semmes Weinstein monofilament test. Normal tactile sensation bilaterally. Nail Exam: Pt has thick disfigured discolored nails with subungual debris noted bilateral entire nail hallux through fifth toenails Ulcer Exam: There is no evidence of ulcer or pre-ulcerative changes or infection. Orthopedic Exam: Muscle tone and strength are WNL. No limitations in general ROM. No crepitus or effusions noted. Foot type and digits show no abnormalities. Bony prominences are unremarkable. Skin: No Porokeratosis. No infection or ulcers  Diagnosis:  Onychomycosis, , Pain in right toe, pain in left toes  Treatment & Plan Procedures and Treatment: Consent by patient was obtained for treatment procedures. The patient understood the discussion of treatment and procedures well. All questions were answered thoroughly reviewed. Debridement of mycotic and hypertrophic toenails, 1 through 5 bilateral and clearing of subungual debris. No ulceration, no infection noted.  Return Visit-Office Procedure: Patient instructed to return to the office for a follow up visit 3 months for continued evaluation and treatment.    Lauren Avery DPM

## 2017-09-22 ENCOUNTER — Ambulatory Visit (INDEPENDENT_AMBULATORY_CARE_PROVIDER_SITE_OTHER): Payer: Medicare Other | Admitting: Orthopaedic Surgery

## 2017-09-22 ENCOUNTER — Other Ambulatory Visit (INDEPENDENT_AMBULATORY_CARE_PROVIDER_SITE_OTHER): Payer: Self-pay | Admitting: Orthopaedic Surgery

## 2017-09-22 ENCOUNTER — Encounter (INDEPENDENT_AMBULATORY_CARE_PROVIDER_SITE_OTHER): Payer: Self-pay | Admitting: Orthopaedic Surgery

## 2017-09-22 DIAGNOSIS — M545 Low back pain, unspecified: Secondary | ICD-10-CM

## 2017-09-22 MED ORDER — PREDNISONE 10 MG (21) PO TBPK: ORAL_TABLET | ORAL | 0 refills | Status: DC

## 2017-09-22 MED ORDER — BACLOFEN 10 MG PO TABS: 10.0000 mg | ORAL_TABLET | Freq: Three times a day (TID) | ORAL | 2 refills | Status: DC | PRN

## 2017-09-22 NOTE — Telephone Encounter (Signed)
Please advise.  Ok to fill? 

## 2017-09-22 NOTE — Progress Notes (Signed)
Office Visit Note   Patient: Lauren Avery           Date of Birth: 08/24/1927           MRN: [id number] Visit Date: 09/22/2017              Requested by: Gaspar Garbeisovec, Richard W, MD [address] [place], [place] [id number] PCP: Gaspar Garbeisovec, Richard W, MD   Assessment & Plan: Visit Diagnoses:  1. Acute bilateral low back pain without sciatica     Plan: I reviewed the films of her right hip and back from the outside facility which shows significant lumbar spondylosis and multilevel degenerative disc disease without any obvious acute findings.  Her right hip replacement appears to be stable.  I recommend home health physical therapy as patient has trouble traveling outside the home.  Prescription for baclofen and prednisone taper.  If no better she should follow-up and we may need to consider advanced imaging to rule out occult fracture.  Otherwise follow-up as needed. Total face to face encounter time was greater than 25 minutes and over half of this time was spent in counseling and/or coordination of care.  Follow-Up Instructions: Return if symptoms worsen or fail to improve.   Orders:  No orders of the defined types were placed in this encounter.  Meds ordered this encounter  Medications  . baclofen (LIORESAL) 10 MG tablet    Sig: Take 1 tablet (10 mg total) by mouth 3 (three) times daily as needed for muscle spasms.    Dispense:  30 each    Refill:  2  . predniSONE (STERAPRED UNI-PAK 21 TAB) 10 MG (21) TBPK tablet    Sig: Take as directed    Dispense:  21 tablet    Refill:  0  . baclofen (LIORESAL) 10 MG tablet    Sig: Take 1 tablet (10 mg total) by mouth 3 (three) times daily as needed for muscle spasms.    Dispense:  30 each    Refill:  2      Procedures: No procedures performed   Clinical Data: No additional findings.   Subjective: Chief Complaint  Patient presents with  . Lower Back - Pain    Patient is a 83 year old female who comes in today for a 2-week  history of low back pain.  I had previously treated her for a left medial condyle elbow fracture.  She denies any radicular pain.  She states she initially had pain in her back when she was putting on her socks.  Her pain is quite severe.  She denies any bowel or bladder dysfunction.  She denies any hip pain.    Review of Systems  Constitutional: Negative.   HENT: Negative.   Eyes: Negative.   Respiratory: Negative.   Cardiovascular: Negative.   Endocrine: Negative.   Musculoskeletal: Negative.   Neurological: Negative.   Hematological: Negative.   Psychiatric/Behavioral: Negative.   All other systems reviewed and are negative.    Objective: Vital Signs: There were no vitals taken for this visit.  Physical Exam  Constitutional: She is oriented to person, place, and time. She appears well-developed and well-nourished.  Pulmonary/Chest: Effort normal.  Neurological: She is alert and oriented to person, place, and time.  Skin: Skin is warm. Capillary refill takes less than 2 seconds.  Psychiatric: She has a normal mood and affect. Her behavior is normal. Judgment and thought content normal.  Nursing note and vitals reviewed.   Ortho Exam Lumbar spine exam  and right hip exam are consistent with mainly muscular pain off to the right side.  She has no focal motor or sensory deficits.  She has tenderness to palpation of the right side lumbar musculature.  Spinous processes are nontender. Specialty Comments:  No specialty comments available.  Imaging: No results found.   PMFS History: Patient Active Problem List   Diagnosis Date Noted  . Laryngopharyngeal reflux (LPR) 09/20/2015  . Fracture of elbow, medial condyle, left, closed 05/09/2015  . Fracture of medial condyle of elbow 05/09/2015  . Long term (current) use of anticoagulants 03/21/2013  . GERD (gastroesophageal reflux disease) 02/24/2013  . Constipation 02/24/2013  . Fracture of elbow, medial condyle, right, closed  02/19/2013    Class: Acute  . Closed fracture of single pubic ramus of pelvis (HCC) 02/19/2013    Class: Acute  . Bilateral sacral insufficiency fracture 02/19/2013    Class: Chronic  . IPF (idiopathic pulmonary fibrosis) (HCC) 02/24/2012  . Chronic cough 02/24/2012  . DM 11/12/2007  . Essential hypertension 11/12/2007   Past Medical History:  Diagnosis Date  . Diabetes mellitus   . Fracture 10/2012   left wrist  . Fracture of left clavicle    12/12 wore a sling  . GERD (gastroesophageal reflux disease)   . Hypertension   . Osteoporosis   . Pulmonary fibrosis (HCC)     Family History  Problem Relation Age of Onset  . Pancreatic cancer Brother   . Stroke Mother   . Diabetes Father     Past Surgical History:  Procedure Laterality Date  . FEMUR FRACTURE SURGERY      6/08 partial hip replacement right  . ganglion cyst removal     left wrist 1960  . HIP FRACTURE SURGERY     1984 pin and plate placed right  . HIP SURGERY     pin and plate removed 12/6107  . ORIF ELBOW FRACTURE Right 02/18/2013   Procedure: OPEN REDUCTION INTERNAL FIXATION (ORIF) ELBOW/OLECRANON FRACTURE;  Surgeon: Nadara Mustard, MD;  Location: [facility] OR;  Service: Orthopedics;  Laterality: Right;  . ORIF ELBOW FRACTURE Left 05/09/2015   Procedure: OPEN REDUCTION INTERNAL FIXATION (ORIF) LEFT MEDIAL HUMERAL CONDYLE FRACTURE;  Surgeon: Tarry Kos, MD;  Location: [facility] OR;  Service: Orthopedics;  Laterality: Left;   Social History   Occupational History  . retired Air[employer] pilot    Social History Main Topics  . Smoking status: Never Smoker  . Smokeless tobacco: Never Used  . Alcohol use No  . Drug use: No  . Sexual activity: Not on file

## 2017-09-23 ENCOUNTER — Ambulatory Visit (INDEPENDENT_AMBULATORY_CARE_PROVIDER_SITE_OTHER)
Admission: RE | Admit: 2017-09-23 | Discharge: 2017-09-23 | Disposition: A | Discharge status: Home / Self Care | Payer: Medicare Other | Source: Ambulatory Visit | Attending: Pulmonary Disease | Admitting: Pulmonary Disease

## 2017-09-23 ENCOUNTER — Encounter: Payer: Self-pay | Admitting: Pulmonary Disease

## 2017-09-23 ENCOUNTER — Ambulatory Visit (INDEPENDENT_AMBULATORY_CARE_PROVIDER_SITE_OTHER): Payer: Medicare Other | Admitting: Pulmonary Disease

## 2017-09-23 ENCOUNTER — Telehealth (INDEPENDENT_AMBULATORY_CARE_PROVIDER_SITE_OTHER): Payer: Self-pay | Admitting: Orthopaedic Surgery

## 2017-09-23 VITALS — BP 120/62 | HR 82 | Temp 97.3°F | Ht 62.0 in | Wt 121.2 lb

## 2017-09-23 DIAGNOSIS — R053 Chronic cough: Secondary | ICD-10-CM

## 2017-09-23 DIAGNOSIS — R05 Cough: Secondary | ICD-10-CM

## 2017-09-23 DIAGNOSIS — K219 Gastro-esophageal reflux disease without esophagitis: Secondary | ICD-10-CM

## 2017-09-23 DIAGNOSIS — J84112 Idiopathic pulmonary fibrosis: Secondary | ICD-10-CM

## 2017-09-23 DIAGNOSIS — R5381 Other malaise: Secondary | ICD-10-CM | POA: Diagnosis not present

## 2017-09-23 DIAGNOSIS — J8410 Pulmonary fibrosis, unspecified: Secondary | ICD-10-CM | POA: Diagnosis not present

## 2017-09-23 NOTE — Patient Instructions (Signed)
Today we updated your med list in our EPIC system...    Continue your current medications the same...  Today we rechecked your CXR to compare to last yrs film...    We will contact you w/ the results when available...   We discussed options for adjusting your current meds (Baclofen, Tramadol, Advil, Tylenol) to get some relief of your back pain...  Be sure to notify DrTisovec of your situation & sched a follow up w/ him to supervise your back pain recovery...  Call for any questions or if I can be of assistance in any way...  Let's plan a follow up visit in 26mo, sooner if needed for any breathing problems.Marland Kitchen..Marland Kitchen

## 2017-09-23 NOTE — Progress Notes (Signed)
Subjective:     Patient ID: Lauren Avery, female   DOB: August 03, 1927, 83 y.o.   MRN: [id number]  HPI  ~  March 16, 2014:  72yr ROV w/ KC>        The patient comes in today for followup of her known idiopathic pulmonary fibrosis and chronic cough that is felt secondary to upper and lower airway issues. The patient was last seen 2 years ago, and she never followed up with her chest x-ray and pulmonary function studies because of other medical issues that required hospitalizations. She comes in today where her cough continues to be a significant problem, and she feels that her exertional tolerance has declined as well. She is no longer taking her proton pump inhibitor, and is having ongoing reflux symptoms. She is also having a lot of rhinorrhea, and is not taking her antihistamine on a regular basis.      REC>  1) Chronic cough:  The pt continues to have dry cough that I suspect is both upper and lower airway in origin. She is having ongoing reflux and in is no longer taking her PPI, and also is having rhinorrhea. Chronic dry cough is also a hallmark of IPF. I have also stressed to her the importance of reflux treatment in patients with interstitial lung disease, and how it can actually cause progressive disease . It has become standard of care to treat all patients with IPF with a proton pump inhibitor empirically. Will also try cough suppression both during the day and night.  2) IPF:  The patient has idiopathic pulmonary fibrosis, and she has not had recent chest x-ray or PFTs because of other medical issues that needed to be taken care of. She does feel that her breathing is a little worse now than in the past, and I will schedule her for a followup chest x-ray and pulmonary function studies. I do not think she is a great candidate for anti-fibrotic therapy.  ~  March 27, 2014:  2wk ROV w/ KC>        The patient comes in today for followup after her recent chest x-ray and PFTs. She has known  idiopathic pulmonary fibrosis, and her chest x-ray showed progressive disease. Her PFTs compared to 2013 showed a decline in her FVC and T. LC, but her diffusion capacity was actually a little better. The patient continues to have her dry cough, but has recently started to have more of a wet cough that is productive of only white mucus. She does not feel chest congestion. She was having loose stools from her proton pump inhibitor, and this was discontinued. She now has breakthrough reflux symptoms for which she is taking times. She does feel that tramadol helps her dry cough.      REC>  The patient's most recent chest x-ray showed some disease progression, and although her PFTs do show a drop in her .FVC and TLC, her DLCO is actually slightly improved. Again, I do not think she is a very good candidate for the anti-fibrotic agents, especially since they are associated with severe diarrhea. I worry about her frailty going forward. At this point, I think we will continue to watch her, and work on symptom control. I think it is very important that she stays on a proton pump inhibitor, since reflux is extremely common in pulmonary fibrosis patients. Not only does it worsen cough, but it also can cause progressive disease. She did not do well on Prilosec, so we'll  try her on Protonix. I have also asked her to continue on something for postnasal drip As well. She tells me that tramadol did help her cough, and I have asked her to continue on this as needed. Of note, her ambulatory oximetry today on room air did not show desaturation of significance.  ~  September 20, 2015:  72mo ROV w/ SN>  Her PCP is DrTisovec...      [name] w/ pulm fibrosis, not on meds and treated only w/ Hycodan prn;  She recently noted incr cough, mostly dry but noted more after eating & strenuous activity; cough syrup helps and she notes that Tramadol helped in past; she denies f/c/s, no CP/ palpit/ edema; she does wake some at night coughing;  she denies SOB w/ ADLs etc; DrTisovec recently added ProairHFA prn use...       EXAM shows Afeb, VSS, O2sat=93% on RA;  HEENT- neg, mallampati1;  Chest- velcro rales ~1/2 way up posteriorly & ant as well;  Heart- RR gr1/6 SEM w/o r/g;  Abd- soft, neg;  Ext- neg w/o c/c/e...   CT Chest 02/16/12 at [facility] showed diffuse bilat interstitial fibrosis w/ subpleural honeycombing- progressively worse compared to 2007; 5mm right apical nodule, 4mm medial RLL nodule, small mediastinal nodes, norm heart size & mild calcif atherosclerotic changes, mult healed left rib fxs, mod mid-thoracic compression fx, mult gallstones noted...  PFT 03/16/12 showed FVC=2.02 (88%), FEV1=1.72 (114%), %1sec=85, mid-flows wnl at 108% predicted;  TLC=3.50 (81%), RV=1.48 (79%), RV/TLC=42;  DLCO=58% predicted => c/w mild restriction and mod decr in diffusion...  PFT 03/20/14 showed FVC=1.88 (88%), FEV1=1.59 (101%), %1sec=85, mid-flows wnl at 183% predicted; TLC=3.31 (69%), RV=1.47 (61%), RV/TLC=44;  DLCO=44% predicted => c/w mild to mod restrictive dis w/ severe decr in diffusion capacity...   CXR 03/2014 showed norm heart size, progressive ILD, old compression T8...  CXR 09/20/15 showed norm heart size, diffuse bilat fibrotic changes, thoracic compression T8 & T12...  Ambulatory oxygen saturation test 09/20/15 showed O2sat=99% on RA at rest w/ pulse=86/min; she ambulatedonly 1 lap & stopped w/ fatigue & SOB; lowest O2sat=94% w/ pulse=119/min...   ?she had recent ONO by DrTisovec => we will try to get copy sent to us to review... PLAN>>  Lauren Avery has severe pulm fibrosis which creates quite a dilemma in a fragile 83 y/o lady; I agree that she is not a good cand for antifibrotic therapy & also not a good cand for Pred rx given her osteoporosis & mult fxs; Rec to try ADVAIR500- 1 inhalation Bid + her ProairHFA as needed; she has Hycodan and Tramadol to use for the cough; deconditioning is a serious issue for her as she has been way  too sedentary over the yrs- rec to consider pulm rehb, silver sneakers, yoga, etc but she must be careful (no strenuous activ & no falling allowed!).Marland Kitchen..   ~  March 20, 2016:  48mo ROV w/ SN>  Lauren Avery has her own ideas about things and is pretty set in her ways at 83 y/o and she tells me on the one hand that her breathing is the same, then on the other hand she notes that she's not taking our meds!  Recall last visit we reviewed her IPF, GERD & LPR, plus her cough & deconditioning> we recommended trial of ADVAIR500- one inhalation Bid, PROAIR-HFA as needed, Hycodan cough syrup and Tramadol for the cough, and a vigorous antireflux regimen w/ Protonix/ NPO after dinner/ elev HOB... She pretty much hasn't done any  of this (much to her daughter's chagrin) so we spent some time reviewing the diagnoses and recommended treatments...    IPF>  Progressive ILD on serial CXR/ scans; he's only been using the Advair500 one puff daily; she is 83 y/o & frail w/ osteoporosis & mult fxs => we discussed trial ADVAIR500Bid...    GERD/ LPR>  She stopped the PPI & never followed the antireflux regimen;  We reviewed the need for Protonix40 taken 30min before dinner, NPO after dinner, elev HOB 6" and she will comply she says...    Cough>  She has Hycodan and Tramadol; explained how antireflux regimen will help as well...    Severe deconditioning>  She has remained sedentary & we discussed the implications for NHP if she doesn't keep moving! Discussed exercise program to improve her stamina...    MEDICAL issues>  HBP, DM, osteoporosis, compression fxs, etc => per DrTisovec... EXAM shows Afeb, VSS, O2sat=97% on RA;  HEENT- neg, mallampati1;  Chest- velcro rales ~1/2 way up posteriorly, no wheezing or rhonchi;  Heart- RR gr1/6 SEM w/o r/g;  Abd- soft, neg;  Ext- neg w/o c/c/e;  Neuro- intact...  IMP/PLAN>>  Lauren Avery is asked to get with the program & see if she responds- use the AdvairBid, ProventilHFA prn, follow the vigorous antireflux  regimen, use the cough suppressants as needed & start improving her exercise program...   ~  September 23, 2016:  574mo ROV w/ SN>  Her PCP is DrTisovec & she reports CPX 07/2016 w/ Labs "all ok" she says;  Followed for IPF, age 83, adeq oxygenation- not requiring O2, multifactorial dyspnea, chr cough;  She tends to use the Hycodan Qhs since "it helps me rest" and uses the Tramadol50- 1/2 tab prn days;  She ambulates w/ walker & NOT very active but encouraged to continue walking to build stamina & insure she will be able to continue doing her ADLs w/o assit-- but she is quite set in her ways & appears to NOT be taking her meds as prescribed (eg- only using Advair once daily...    EXAM shows Afeb, VSS, O2sat=97% on RA;  HEENT- neg, mallampati1;  Chest- velcro rales ~1/2 way up posteriorly, no wheezing or rhonchi;  Heart- RR gr1/6 SEM w/o r/g;  Abd- soft, neg;  Ext- neg w/o c/c/e;  Neuro- intact...   CXR 09/23/16 (independently reviewed by me in the PACS system) showed norm heart size, diffuse fibrotic changes bilat- stable, mild compression fx mid Tspine, & severe compreession fx in upper Lspine (both stable)...  IMP/PLAN>>  Lauren Avery is [age] & set in her ways- I have rec that she incr the Advair500 to Bid, take the PPI & follow the antireflux regimen, use the Hycodan & Tramadol prn cough; she needs incr exercise/ walking/ chair exercise/ etc...   ~  March 24, 2017:  574mo ROV w/ SN>  Lauren Avery is reasonably stable w/ her pulm fibrosis, mild cough (esp after eating & w/ exercise), and notes some back discomfort;  She has Advair500- supposed to be doing one inhalation Bid but only doing it once per day (her pref), and Tramadol50  Up to Bid as needed for pain & cough (told to take w/ Tylenol to boost it's effect)...  we reviewed the following medical problems during today's office visit >>     IPF>  Progressive ILD on serial CXR/ scans; he's only been using the Advair500 one puff daily; she is 83 y/o & frail w/ osteoporosis &  mult fxs => we  discussed taking the ADVAIR500Bid & use Tramadol + OTCs prn cough...    GERD/ LPR>  She stopped the PPI & never followed the antireflux regimen;  We reviewed the need for Protonix40 taken 30min before dinner, NPO after dinner, elev HOB 6" but she never complied!    Cough>  She has Hycodan and Tramadol; explained how antireflux regimen will help as well...    Severe deconditioning>  She has remained sedentary & we discussed the implications for NHP if she doesn't keep moving! Discussed exercise program to improve her stamina...    MEDICAL issues>  HBP (onAmlod5, Losar50), DM (on Glimep2), osteoporosis (on Reclast yrly), compression fxs, etc => per DrTisovec... EXAM shows Afeb, VSS, O2sat=96% on RA;  HEENT- neg, mallampati1;  Chest- velcro rales ~1/2 way up posteriorly (no change), no wheezing or rhonchi;  Heart- RR gr1/6 SEM w/o r/g;  Abd- soft, neg;  Ext- neg w/o c/c/e...   Ambulatory Oximetery 03/24/17>  O2sat=98% on RA at rest w/ pulse=92/min;  She ambulated 2 laps in office (185'ea) w/ lowest O2sat=94% w/ pulse ~114/min...  ONO performed 04/10/17 on RA & showed O2sat zenith of 97%, nadir of 86%, Ave of 94%;  She spent only 1min 12 sec <88% saturation & does not qualify for Home O2... IMP/PLAN>>  Lauren Avery is asked to incr the Advair500 to Bid; OK to use the Tramadol prn back discomfort or cough & can add Tylenol to potentiate it's effect;  She still needs to consider my rec for incr exercise, better conditioning!  We will maintain [id number] rov & sooner if needed for breathing problems...   ~  September 23, 2017:  321mo ROV & Clelia SchaumannMarg returns c/o back pain- thinks she pulled a muscle & nothing is helping, states she was seen by Ortho & urgent care, her PCP is DrTisovec & she hasn't approached him w/ this issue;  She has been using Tramadol/ Tylenol/ Baclofen, plus heating pad, etc and she notes that Aleve seems to help more!  We follow her for prob IPF, age 83 now, w/ chr stable DOE while walking ("I walk  the halls" & is limited by the back pain currently;  She denies much cough, min clear sput, no hemoptysis, denies f/c/s, no CP, etc...     IPF>  Progressive ILD on serial CXR/ scans; she's using the Advair500 Bid now; she is 83 y/o & frail w/ osteoporosis & mult fxs => we discussed taking the ADVAIR500Bid & use Tramadol + OTCs prn cough...    GERD/ LPR>  We reviewed the need for Protonix40 taken 30min before dinner, NPO after dinner, elev HOB 6" but she never complied!    Cough>  She has Tramadol prn 7 off the prev Hycodan; explained how antireflux regimen will help as well...    Severe deconditioning>  She has remained sedentary & we discussed the implications for NHP if she doesn't keep moving! Discussed exercise program to improve her stamina...    MEDICAL issues>  HBP (onAmlod5, Losar50), DM (on Glimep2), osteoporosis (on Reclast yrly), compression fxs, etc => per PCP-DrTisovec... EXAM shows Afeb, VSS, O2sat=96% on RA;  HEENT- neg, mallampati1;  Chest- velcro rales ~1/2 way up posteriorly (no change), no wheezing or rhonchi;  Heart- RR gr1/6 SEM w/o r/g;  Abd- soft, neg;  Ext- neg w/o c/c/e...   CXR 09/23/17 (independently reviewed by me in the PACS system) shows norm heart size, aortic atherosclerosis, stable pulm fibrotic changes, wedge compression fxs in T9, L1, & L2...  IMP/PLAN>>  Lauren Avery need her osteoporotic compression fxs attended by her PCP/ Ortho/ etc;  From the pulm perspective she is stable on the Advair500Bid, antireflux regimen, Tramadol...     Past Medical History  Diagnosis  Date  . Osteoporosis >> on RECLAST per DrTisovec   . Pulmonary fibrosis (HCC) >> on Hycodan, Tramadol, ProairHFA prn...   . GERD (gastroesophageal reflux disease) >> prev on Protonix40 but she stopped on her own & never embraced the antireflux regimen   . Hypertension >> on Amlod5 & Cozaar50   . Diabetes mellitus >> on Glimep2mg    . Fracture >> off Norco7,5 & on Tramadol50     Left clavicle 12/12 rx w/  sling     left wrist 12/13    Left elbow w/ surg 05/2015 by Domingo Dimes    Past Surgical History:  Procedure Laterality Date  . FEMUR FRACTURE SURGERY      6/08 partial hip replacement right  . ganglion cyst removal     left wrist 1960  . HIP FRACTURE SURGERY     1984 pin and plate placed right  . HIP SURGERY     pin and plate removed 12/6107  . ORIF ELBOW FRACTURE Right 02/18/2013   Procedure: OPEN REDUCTION INTERNAL FIXATION (ORIF) ELBOW/OLECRANON FRACTURE;  Surgeon: Nadara Mustard, MD;  Location: [facility] OR;  Service: Orthopedics;  Laterality: Right;  . ORIF ELBOW FRACTURE Left 05/09/2015   Procedure: OPEN REDUCTION INTERNAL FIXATION (ORIF) LEFT MEDIAL HUMERAL CONDYLE FRACTURE;  Surgeon: Tarry Kos, MD;  Location: [facility] OR;  Service: Orthopedics;  Laterality: Left;    Outpatient Encounter Prescriptions as of 09/23/2017  Medication Sig  . acetaminophen (TYLENOL) 325 MG tablet Take 2 tablets (650 mg total) by mouth every 6 (six) hours as needed. (Patient taking differently: Take 650 mg by mouth every 6 (six) hours as needed for mild pain, fever or headache. )  . ADVAIR DISKUS 500-50 MCG/DOSE AEPB INHALE 1 PUFF INTO LUNGS TWICE DAILY  . amLODipine (NORVASC) 5 MG tablet Take 5 mg by mouth every other day.   . baclofen (LIORESAL) 10 MG tablet Take 1 tablet (10 mg total) by mouth 3 (three) times daily as needed for muscle spasms.  Marland Kitchen glimepiride (AMARYL) 2 MG tablet Take 4 mg by mouth daily before breakfast.   . losartan (COZAAR) 50 MG tablet Take 50 mg by mouth daily.  . Multiple Vitamins-Minerals (MULTIVITAMIN WITH MINERALS) tablet Take 1 tablet by mouth daily.  . pantoprazole (PROTONIX) 40 MG tablet Take 1 tablet (40 mg total) by mouth 2 (two) times daily.  . predniSONE (STERAPRED UNI-PAK 21 TAB) 10 MG (21) TBPK tablet Take as directed  . traMADol (ULTRAM) 50 MG tablet Take 1/2-1 tablet three times daily as directed for cough  . Zoledronic Acid (RECLAST IV) Inject into the vein. yearly  .  [DISCONTINUED] baclofen (LIORESAL) 10 MG tablet TAKE 1 TABLET(10 MG) BY MOUTH THREE TIMES DAILY AS NEEDED FOR MUSCLE SPASMS (Patient not taking: Reported on 09/23/2017)  . [DISCONTINUED] calcium carbonate (TUMS - DOSED IN MG ELEMENTAL CALCIUM) 500 MG chewable tablet Chew 1 tablet by mouth 2 (two) times daily with a meal.  . [DISCONTINUED] HYDROcodone-homatropine (HYCODAN) 5-1.5 MG/5ML syrup Take 5 mLs by mouth every 4 (four) hours as needed for cough. (Patient not taking: Reported on 03/24/2017)   No facility-administered encounter medications on file as of 09/23/2017.     Allergies  Allergen Reactions  . Hydrochlorothiazide Other (See Comments)    Taken from faxed notes of dr  tisovec (to short stay)  THROMBOCYTOPENIA  . Prilosec [Omeprazole] Diarrhea    Immunization History  Administered Date(s) Administered  . Influenza Split 09/01/2011, 09/15/2012  . Influenza, High Dose Seasonal PF 09/10/2017  . Influenza,inj,Quad PF,6+ Mos 08/31/2013, 07/31/2016  . Influenza-Unspecified 09/06/2015  . PPD Test 05/11/2015, 05/14/2015  . Pneumococcal Conjugate-13 07/20/2014  . Pneumococcal Polysaccharide-23 11/11/2007    Current Medications, Allergies, Past Medical History, Past Surgical History, Family History, and Social History were reviewed in [employer] record.   Review of Systems             All symptoms NEG except where BOLDED >>  Constitutional:  F/C/S, fatigue, anorexia, unexpected weight change. HEENT:  HA, visual changes, hearing loss, earache, nasal symptoms, sore throat, mouth sores, hoarseness. Resp:  cough, sputum, hemoptysis; SOB, tightness, wheezing. Cardio:  CP, palpit, DOE, orthopnea, edema. GI:  N/V/D/C, blood in stool; reflux, abd pain, distention, gas. GU:  dysuria, freq, urgency, hematuria, flank pain, voiding difficulty. MS:  joint pain, swelling, tenderness, decr ROM; neck pain, back pain, etc. Neuro:  HA, tremors, seizures, dizziness, syncope,  weakness, numbness, gait abn. Skin:  suspicious lesions or skin rash. Heme:  adenopathy, bruising, bleeding. Psyche:  confusion, agitation, sleep disturbance, hallucinations, anxiety, depression suicidal.   Objective:   Physical Exam       Vital Signs:  Reviewed...   General:  WD, petite, [name] in NAD; alert & oriented; pleasant & cooperative... HEENT:  [place]/AT; Conjunctiva- pink, Sclera- nonicteric, EOM-wnl, PERRLA, EACs-clear, TMs-wnl; NOSE-clear; THROAT-clear & wnl.  Neck:  Supple w/ fair ROM; no JVD; normal carotid impulses w/o bruits; no thyromegaly or nodules palpated; no lymphadenopathy.  Chest:  Bibasilar velcro rales ~1/3rd to 1/2 the way up the back, no wheezing/ rhonchi/ consolidation... Heart:  Regular Rhythm; norm S1 & S2 without murmurs, rubs, or gallops detected. Abdomen:  Soft & nontender- no guarding or rebound; normal bowel sounds; no organomegaly or masses palpated. Ext:  Sl decr ROM; without deformities +arthritic changes; no varicose veins, venous insuffic, or edema;  Pulses intact w/o bruits. Neuro:  CNs II-XII intact; motor testing normal; sensory testing normal; gait normal & balance OK. Derm:  No lesions noted; no rash etc. Lymph:  No cervical, supraclavicular, axillary, or inguinal adenopathy palpated.   Assessment:      IMP >>     Diffuse pulmonary fibrosis in an 83 y/o lady> Given her age & co-morbidities I agree that she is not a cand for antifibrotic therapy; neither is she a good cand for Pred trial given her severe osteoporosis & mult fxs; therefore we will start ADVAIR500- 1 inhalation Bid and continue the ProairHFA rescue inhaler prn along w/ the Hycodan and Tramadol prn cough...    Deconditioned and poor exercise tolerance> We discussed this issue & the need for a gradual progressive exercise program- eg PulmRehab vs silver sneakers, [employer], Yoga, etc...    GERD and prob LPR> She needs a vigorous antireflux regimen w/ Protonix40 taken before  dinner, NPO after dinner, elev HOB 6" blocks, etc...    HBP> on Amlod & Losar per DrTisovec...    DM> on Glimep per DrTisovec..    Osteoporosis & thoracic compression fxs, mult other fxs (hip, femur, clavicle, wrist, elbow)> on RECLAST + calcium/ MVI/ VitD, Norco, Tramadol...  PLAN >>  09/20/15>   Floyd has severe pulm fibrosis which creates quite a dilemma in a fragile 83 y/o lady; I agree that she is not a good cand for  antifibrotic therapy & also not a good cand for Pred rx given her osteoporosis & mult fxs; Rec to try ADVAIR500- 1 inhalation Bid + her ProairHFA as needed; she has Hycodan and Tramadol to use for the cough; deconditioning is a serious issue for her as she has been way too sedentary over the yrs- rec to consider pulm rehb, silver sneakers, yoga, etc but she must be careful (no strenuous activ & no falling allowed!) 03/20/16>   Lauren Avery is asked to get with the program & see if she responds- use the AdvairBid, ProventilHFA prn, follow the vigorous antireflux regimen, use the cough suppressants as needed & start improving her exercise program...  09/23/16>   Lauren Avery is 89 & set in her ways- I have rec that she incr the Advair500 to Bid, take the PPI & follow the antireflux regimen, use the Hycodan & Tramadol prn cough; she needs incr exercise/ walking/ chair exercise/ etc 03/24/17>   Lauren Avery is asked to incr the Advair500 to Bid; OK to use the Tramadol prn back discomfort or cough & can add Tylenol to potentiate it's effect;  She still needs to consider my rec for incr exercise, better conditioning!  We will maintain Q105mo rov & sooner if needed for breathing problems 09/23/17>   Lauren Avery need her osteoporotic compression fxs attended by her PCP/ Ortho/ etc;  From the pulm perspective she is stable on the Advair500Bid, antireflux regimen, Tramadol...      Plan:     Patient's Medications  New Prescriptions   No medications on file  Previous Medications   ACETAMINOPHEN (TYLENOL) 325 MG TABLET     Take 2 tablets (650 mg total) by mouth every 6 (six) hours as needed.   ADVAIR DISKUS 500-50 MCG/DOSE AEPB    INHALE 1 PUFF INTO LUNGS TWICE DAILY   AMLODIPINE (NORVASC) 5 MG TABLET    Take 5 mg by mouth every other day.    BACLOFEN (LIORESAL) 10 MG TABLET    Take 1 tablet (10 mg total) by mouth 3 (three) times daily as needed for muscle spasms.   GLIMEPIRIDE (AMARYL) 2 MG TABLET    Take 4 mg by mouth daily before breakfast.    LOSARTAN (COZAAR) 50 MG TABLET    Take 50 mg by mouth daily.   MULTIPLE VITAMINS-MINERALS (MULTIVITAMIN WITH MINERALS) TABLET    Take 1 tablet by mouth daily.   PANTOPRAZOLE (PROTONIX) 40 MG TABLET    Take 1 tablet (40 mg total) by mouth 2 (two) times daily.   PREDNISONE (STERAPRED UNI-PAK 21 TAB) 10 MG (21) TBPK TABLET    Take as directed   TRAMADOL (ULTRAM) 50 MG TABLET    Take 1/2-1 tablet three times daily as directed for cough   ZOLEDRONIC ACID (RECLAST IV)    Inject into the vein. yearly  Modified Medications   No medications on file  Discontinued Medications   BACLOFEN (LIORESAL) 10 MG TABLET    TAKE 1 TABLET(10 MG) BY MOUTH THREE TIMES DAILY AS NEEDED FOR MUSCLE SPASMS   CALCIUM CARBONATE (TUMS - DOSED IN MG ELEMENTAL CALCIUM) 500 MG CHEWABLE TABLET    Chew 1 tablet by mouth 2 (two) times daily with a meal.   HYDROCODONE-HOMATROPINE (HYCODAN) 5-1.5 MG/5ML SYRUP    Take 5 mLs by mouth every 4 (four) hours as needed for cough.

## 2017-09-23 NOTE — Telephone Encounter (Signed)
Oris Dronehompson, Annely Jun 24, 1927 Home  [phone] Please call Doretha SouMoible  [phone]    Pt daughter stated her mom hasn't taken prednisone and she plans to start in the morning. Pt daughter stated she was told that her mom cannot take aleve while taking prednisone. Pt is currently taking aleve with breakfast in the morning and also with dinner. Pt takes tramadol (1 tablet) during bedtime.

## 2017-09-24 NOTE — Telephone Encounter (Signed)
IC daughter and advised yes to d/c aleve when taking prednisone. Muscle relaxer she is taking in 1/2 tablet and taking tramadol in 1/2 dose.

## 2017-09-25 ENCOUNTER — Telehealth (INDEPENDENT_AMBULATORY_CARE_PROVIDER_SITE_OTHER): Payer: Self-pay | Admitting: Radiology

## 2017-09-25 NOTE — Telephone Encounter (Signed)
FYI--- [facility] rec'd request to treat patient at home, however patient request that this start on Monday 09/28/17.

## 2017-09-28 DIAGNOSIS — M810 Age-related osteoporosis without current pathological fracture: Secondary | ICD-10-CM | POA: Diagnosis not present

## 2017-09-28 DIAGNOSIS — M47816 Spondylosis without myelopathy or radiculopathy, lumbar region: Secondary | ICD-10-CM | POA: Diagnosis not present

## 2017-09-28 DIAGNOSIS — M519 Unspecified thoracic, thoracolumbar and lumbosacral intervertebral disc disorder: Secondary | ICD-10-CM | POA: Diagnosis not present

## 2017-09-28 DIAGNOSIS — I10 Essential (primary) hypertension: Secondary | ICD-10-CM | POA: Diagnosis not present

## 2017-09-28 DIAGNOSIS — E119 Type 2 diabetes mellitus without complications: Secondary | ICD-10-CM | POA: Diagnosis not present

## 2017-09-28 DIAGNOSIS — J8410 Pulmonary fibrosis, unspecified: Secondary | ICD-10-CM | POA: Diagnosis not present

## 2017-09-28 NOTE — Telephone Encounter (Signed)
ok 

## 2017-09-30 ENCOUNTER — Telehealth (INDEPENDENT_AMBULATORY_CARE_PROVIDER_SITE_OTHER): Payer: Self-pay | Admitting: Orthopaedic Surgery

## 2017-09-30 DIAGNOSIS — M545 Low back pain: Principal | ICD-10-CM

## 2017-09-30 DIAGNOSIS — G8929 Other chronic pain: Secondary | ICD-10-CM

## 2017-09-30 NOTE — Telephone Encounter (Signed)
Patient's daughter Clydie Braun(karen) called asked if her mother can be set up for an MRI. The number to contact Clydie BraunKaren is [phone]

## 2017-10-01 NOTE — Telephone Encounter (Signed)
Ok MRI L spine r/o fx

## 2017-10-01 NOTE — Telephone Encounter (Signed)
See message.

## 2017-10-01 NOTE — Telephone Encounter (Signed)
MRI order made they will call to make appt to schedule patient for MRI

## 2017-10-01 NOTE — Addendum Note (Signed)
Addended by: Albertina ParrGARCIA, Bricia Taher on: 10/01/2017 01:41 PM   Modules accepted: Orders

## 2017-10-06 DIAGNOSIS — E1129 Type 2 diabetes mellitus with other diabetic kidney complication: Secondary | ICD-10-CM | POA: Diagnosis not present

## 2017-10-08 ENCOUNTER — Ambulatory Visit
Admission: RE | Admit: 2017-10-08 | Discharge: 2017-10-08 | Disposition: A | Discharge status: Home / Self Care | Payer: Medicare Other | Source: Ambulatory Visit | Attending: Orthopaedic Surgery | Admitting: Orthopaedic Surgery

## 2017-10-08 DIAGNOSIS — G8929 Other chronic pain: Secondary | ICD-10-CM

## 2017-10-08 DIAGNOSIS — M48061 Spinal stenosis, lumbar region without neurogenic claudication: Secondary | ICD-10-CM | POA: Diagnosis not present

## 2017-10-08 DIAGNOSIS — M545 Low back pain: Principal | ICD-10-CM

## 2017-10-13 ENCOUNTER — Telehealth (INDEPENDENT_AMBULATORY_CARE_PROVIDER_SITE_OTHER): Payer: Self-pay | Admitting: Radiology

## 2017-10-13 DIAGNOSIS — M545 Low back pain: Principal | ICD-10-CM

## 2017-10-13 DIAGNOSIS — G8929 Other chronic pain: Secondary | ICD-10-CM

## 2017-10-13 NOTE — Telephone Encounter (Signed)
ESI ORDER MADE 

## 2017-10-13 NOTE — Telephone Encounter (Signed)
Patient calling today wanting to know the results of her MRI of Lspine. Please call her back at [phone] to advise.

## 2017-10-13 NOTE — Telephone Encounter (Signed)
See message below °

## 2017-10-13 NOTE — Addendum Note (Signed)
Addended by: Albertina ParrGARCIA, Oakes Mccready on: 10/13/2017 05:01 PM   Modules accepted: Orders

## 2017-10-13 NOTE — Telephone Encounter (Signed)
She has a pinched nerve in her back.  Please [facility] her [facility]

## 2017-10-14 NOTE — Telephone Encounter (Signed)
Called pt to let her know.

## 2017-10-27 ENCOUNTER — Ambulatory Visit (INDEPENDENT_AMBULATORY_CARE_PROVIDER_SITE_OTHER): Payer: Medicare Other

## 2017-10-27 ENCOUNTER — Ambulatory Visit (INDEPENDENT_AMBULATORY_CARE_PROVIDER_SITE_OTHER): Payer: Medicare Other | Admitting: Physical Medicine and Rehabilitation

## 2017-10-27 ENCOUNTER — Encounter (INDEPENDENT_AMBULATORY_CARE_PROVIDER_SITE_OTHER): Payer: Self-pay | Admitting: Physical Medicine and Rehabilitation

## 2017-10-27 VITALS — BP 136/70 | HR 102 | Temp 97.5°F

## 2017-10-27 DIAGNOSIS — M48062 Spinal stenosis, lumbar region with neurogenic claudication: Secondary | ICD-10-CM | POA: Diagnosis not present

## 2017-10-27 DIAGNOSIS — M5416 Radiculopathy, lumbar region: Secondary | ICD-10-CM

## 2017-10-27 MED ORDER — LIDOCAINE HCL (PF) 1 % IJ SOLN
2.0000 mL | Freq: Once | INTRAMUSCULAR | Status: AC
  Administered 2017-10-27: 2 mL

## 2017-10-27 MED ORDER — BETAMETHASONE SOD PHOS & ACET 6 (3-3) MG/ML IJ SUSP
12.0000 mg | Freq: Once | INTRAMUSCULAR | Status: AC
  Administered 2017-10-27: 12 mg

## 2017-10-27 NOTE — Progress Notes (Deleted)
Pt here for injection of lower back. Has pain in lower back 1/10 pain scale. Left side hurts worse but radiates to right. Numbness and tingling going down to left leg. Pt is taking tramadol, aleve and tylenol to help with pain. Lays on recliner to help with pain.   Has driver, no allergy to contrast dye. Not taking blood thinners.

## 2017-10-27 NOTE — Patient Instructions (Signed)

## 2017-11-04 NOTE — Procedures (Signed)
Lumbar Epidural Steroid Injection - Interlaminar Approach with Fluoroscopic Guidance  Patient: Lauren Avery      Date of Birth: 1927-01-15 MRN: [id number] PCP: Gaspar Garbeisovec, Richard W, MD      Visit Date: 10/27/2017   Universal Protocol:     Consent Given By: the patient  Position: PRONE  Additional Comments: Vital signs were monitored before and after the procedure. Patient was prepped and draped in the usual sterile fashion. The correct patient, procedure, and site was verified.   Injection Procedure Details:  Procedure Site One Meds Administered:  Meds ordered this encounter  Medications  . lidocaine (PF) (XYLOCAINE) 1 % injection 2 mL  . betamethasone acetate-betamethasone sodium phosphate (CELESTONE) injection 12 mg     Laterality: Left  Location/Site:  L2-L3  Needle size: 20 G  Needle type: Tuohy  Needle Placement: Paramedian epidural  Findings:   -Comments: Excellent flow of contrast into the epidural space.  Procedure Details: Using a paramedian approach from the side mentioned above, the region overlying the inferior lamina was localized under fluoroscopic visualization and the soft tissues overlying this structure were infiltrated with 4 ml. of 1% Lidocaine without Epinephrine. The Tuohy needle was inserted into the epidural space using a paramedian approach.   The epidural space was localized using loss of resistance along with lateral and bi-planar fluoroscopic views.  After negative aspirate for air, blood, and CSF, a 2 ml. volume of Isovue-250 was injected into the epidural space and the flow of contrast was observed. Radiographs were obtained for documentation purposes.    The injectate was administered into the level noted above.   Additional Comments:  The patient tolerated the procedure well Dressing: Band-Aid    Post-procedure details: Patient was observed during the procedure. Post-procedure instructions were reviewed.  Patient left the  clinic in stable condition.

## 2017-11-04 NOTE — Progress Notes (Signed)
Lauren Avery - 83 y.o. female MRN [id number]  Date of birth: May 20, 1927  Office Visit Note: Visit Date: 10/27/2017 PCP: Gaspar Garbeisovec, Richard W, MD Referred by: Gaspar Garbeisovec, Richard W, MD  Subjective: Chief Complaint  Patient presents with  . Lower Back - Pain   HPI: Lauren Avery is an 83 year old female followed by Dr. Roda ShuttersXu in our office.  He has been managing her evaluating her upper and mid back and low back pain and some radicular pain in the leg.  She has been taking tramadol and Aleve and Tylenol which is helped some.  He does have to lay on a recliner to help.  There is MRI evidence of multiple compression fractures but without edema showing that these are fairly older compression fractures.  At L2-3 there is retropulsion of an endplate with medium sized disc bulge but no real frank stenosis.  She does have some foraminal narrowing here.  Her case is medically complicated by diabetes, pulmonary fibrosis as well as multiple sacral fractures in the past.  She was on chronic anticoagulation which may have affected the osteoporosis as well.  Is followed by her primary physicians for the osteoporosis.    ROS Otherwise per HPI.  Assessment & Plan: Visit Diagnoses:  1. Spinal stenosis of lumbar region with neurogenic claudication   2. Lumbar radiculopathy     Plan: Findings:  Lower back pain which is mid lower back pain left more than right with some numbness and tingling in the left leg and thigh.  I do think it is warranted to complete epidural injection at the L2-3 level.  She has nothing in the lower levels that would suggest any nerve compression or other issues for epidural injection.  If she does not get much relief with the injection I would suggest a course of physical therapy may be lumbar bracing.    Meds & Orders:  Meds ordered this encounter  Medications  . lidocaine (PF) (XYLOCAINE) 1 % injection 2 mL  . betamethasone acetate-betamethasone sodium phosphate (CELESTONE)  injection 12 mg    Orders Placed This Encounter  Procedures  . XR C-ARM NO REPORT  . Epidural Steroid injection    Follow-up: Return if symptoms worsen or fail to improve, for Dr. Roda ShuttersXu consider PT.   Procedures: No procedures performed  Lumbar Epidural Steroid Injection - Interlaminar Approach with Fluoroscopic Guidance  Patient: Lauren Avery      Date of Birth: May 20, 1927 MRN: [id number] PCP: Gaspar Garbeisovec, Richard W, MD      Visit Date: 10/27/2017   Universal Protocol:     Consent Given By: the patient  Position: PRONE  Additional Comments: Vital signs were monitored before and after the procedure. Patient was prepped and draped in the usual sterile fashion. The correct patient, procedure, and site was verified.   Injection Procedure Details:  Procedure Site One Meds Administered:  Meds ordered this encounter  Medications  . lidocaine (PF) (XYLOCAINE) 1 % injection 2 mL  . betamethasone acetate-betamethasone sodium phosphate (CELESTONE) injection 12 mg     Laterality: Left  Location/Site:  L2-L3  Needle size: 20 G  Needle type: Tuohy  Needle Placement: Paramedian epidural  Findings:   -Comments: Excellent flow of contrast into the epidural space.  Procedure Details: Using a paramedian approach from the side mentioned above, the region overlying the inferior lamina was localized under fluoroscopic visualization and the soft tissues overlying this structure were infiltrated with 4 ml. of 1% Lidocaine without Epinephrine. The Tuohy needle was  inserted into the epidural space using a paramedian approach.   The epidural space was localized using loss of resistance along with lateral and bi-planar fluoroscopic views.  After negative aspirate for air, blood, and CSF, a 2 ml. volume of Isovue-250 was injected into the epidural space and the flow of contrast was observed. Radiographs were obtained for documentation purposes.    The injectate was administered into the  level noted above.   Additional Comments:  The patient tolerated the procedure well Dressing: Band-Aid    Post-procedure details: Patient was observed during the procedure. Post-procedure instructions were reviewed.  Patient left the clinic in stable condition.    Clinical History: MRI LUMBAR SPINE WITHOUT CONTRAST  TECHNIQUE: Multiplanar, multisequence MR imaging of the lumbar spine was performed. No intravenous contrast was administered.  COMPARISON:  Lumbar spine radiograph 11/10/2012 and 02/17/2013  FINDINGS: Segmentation:  Standard.  Alignment:  Left convex scoliosis with apex at L1-2.  Vertebrae: There is a compression fracture at hilar approximately 75% height loss. There is approximately 50% height loss of L1 due to large inferior endplate Schmorl's node. There is 50% height loss of L2 due to compression deformity. There is minimal edema at these 3 levels.  Conus medullaris: Extends to the L1 level and appears normal.  Paraspinal and other soft tissues: Multiple left renal cysts measure up to 2 cm.  Disc levels:  T11-T12: Small central disc protrusion and retropulsion of superior T12 endplate without spinal canal stenosis. Mild bilateral neural foraminal stenosis.  T12-L1: Small central protrusion without spinal canal stenosis. Mild right and moderate left neural foraminal stenosis.  L1-L2: Small disc bulge. No spinal canal or neural foraminal stenosis.  L2-L3: There is retropulsion of the inferior L2 endplate and a medium-sized disc bulge with moderate bilateral facet hypertrophy. There is mild central spinal canal stenosis with severe right and moderate left neural foraminal stenosis.  L3-L4: Small disc bulge and moderate facet hypertrophy. No spinal canal stenosis. Mild left neural foraminal narrowing.  L4-L5: Mild diffuse bulge. Right-greater-than-left facet hypertrophy. No spinal canal or neural foraminal stenosis.  L5-S1:  Normal disc space and facets. No spinal canal or neuroforaminal stenosis.  Visualized sacrum: Normal.  IMPRESSION: 1. Mild spinal canal stenosis at L2-L3 with severe right and moderate left neural foraminal stenosis due to combination of inferior L2 endplate retropulsion, disc bulge and moderate facet arthrosis. 2. Multilevel mild-to-moderate neural foraminal stenosis. 3. Compression deformities of T12, L1, L2 are favored to be chronic given the relative lack of edema, but the precise age of the fractures is indeterminate. They are at least new/progressed compared to 02/17/2013.   Electronically Signed   By: Deatra RobinsonKevin  Herman M.D.   On: 10/09/2017 02:07  She reports that  has never smoked. she has never used smokeless tobacco. No results for input(s): HGBA1C, LABURIC in the last 8760 hours.  Objective:  VS:  HT:    WT:   BMI:     BP:136/70  HR:(!) 102bpm  TEMP:(!) 97.5 F (36.4 C)(Oral)  RESP:97 % Physical Exam  Musculoskeletal:  Patient is slow to rise from a seated position.  She does have some increased kyphosis.  She has no pain with hip rotation.  She has good distal strength bilaterally.    Ortho Exam Imaging: No results found.  Past Medical/Family/Surgical/Social History: Medications & Allergies reviewed per EMR Patient Active Problem List   Diagnosis Date Noted  . Physical deconditioning 09/23/2017  . Laryngopharyngeal reflux (LPR) 09/20/2015  . Fracture of elbow, medial condyle,  left, closed 05/09/2015  . Fracture of medial condyle of elbow 05/09/2015  . Long term (current) use of anticoagulants 03/21/2013  . GERD (gastroesophageal reflux disease) 02/24/2013  . Constipation 02/24/2013  . Fracture of elbow, medial condyle, right, closed 02/19/2013    Class: Acute  . Closed fracture of single pubic ramus of pelvis (HCC) 02/19/2013    Class: Acute  . Bilateral sacral insufficiency fracture 02/19/2013    Class: Chronic  . IPF (idiopathic pulmonary fibrosis)  (HCC) 02/24/2012  . Chronic cough 02/24/2012  . DM 11/12/2007  . Essential hypertension 11/12/2007   Past Medical History:  Diagnosis Date  . Diabetes mellitus   . Fracture 10/2012   left wrist  . Fracture of left clavicle    12/12 wore a sling  . GERD (gastroesophageal reflux disease)   . Hypertension   . Osteoporosis   . Pulmonary fibrosis (HCC)    Family History  Problem Relation Age of Onset  . Pancreatic cancer Brother   . Stroke Mother   . Diabetes Father    Past Surgical History:  Procedure Laterality Date  . FEMUR FRACTURE SURGERY      6/08 partial hip replacement right  . ganglion cyst removal     left wrist 1960  . HIP FRACTURE SURGERY     1984 pin and plate placed right  . HIP SURGERY     pin and plate removed 12/6107  . ORIF ELBOW FRACTURE Right 02/18/2013   Procedure: OPEN REDUCTION INTERNAL FIXATION (ORIF) ELBOW/OLECRANON FRACTURE;  Surgeon: Nadara Mustard, MD;  Location: [facility] OR;  Service: Orthopedics;  Laterality: Right;  . ORIF ELBOW FRACTURE Left 05/09/2015   Procedure: OPEN REDUCTION INTERNAL FIXATION (ORIF) LEFT MEDIAL HUMERAL CONDYLE FRACTURE;  Surgeon: Tarry Kos, MD;  Location: [facility] OR;  Service: Orthopedics;  Laterality: Left;   Social History   Occupational History  . Occupation: retired Airline pilot  Tobacco Use  . Smoking status: Never Smoker  . Smokeless tobacco: Never Used  Substance and Sexual Activity  . Alcohol use: No  . Drug use: No  . Sexual activity: Not on file

## 2017-11-18 ENCOUNTER — Other Ambulatory Visit (INDEPENDENT_AMBULATORY_CARE_PROVIDER_SITE_OTHER): Payer: Self-pay

## 2017-11-18 ENCOUNTER — Telehealth (INDEPENDENT_AMBULATORY_CARE_PROVIDER_SITE_OTHER): Payer: Self-pay | Admitting: Orthopaedic Surgery

## 2017-11-18 DIAGNOSIS — G8929 Other chronic pain: Secondary | ICD-10-CM

## 2017-11-18 DIAGNOSIS — M545 Low back pain: Principal | ICD-10-CM

## 2017-11-18 NOTE — Telephone Encounter (Signed)
yes

## 2017-11-18 NOTE — Telephone Encounter (Signed)
See message below. If so, where and what orders would you like.

## 2017-11-18 NOTE — Telephone Encounter (Signed)
Patient's daughter Clydie Braun(Karen) called asked if her mother can be set up to receive outpatient (PT) The number to contact Clydie BraunKaren is [phone]

## 2017-11-18 NOTE — Telephone Encounter (Signed)
Called patient to let her know PT order mas been made and they will call her to schedule appt

## 2017-12-03 ENCOUNTER — Ambulatory Visit: Payer: Medicare Other | Attending: Orthopaedic Surgery | Admitting: Physical Therapy

## 2017-12-03 ENCOUNTER — Encounter: Payer: Self-pay | Admitting: Physical Therapy

## 2017-12-03 DIAGNOSIS — M546 Pain in thoracic spine: Secondary | ICD-10-CM | POA: Insufficient documentation

## 2017-12-03 DIAGNOSIS — M545 Low back pain, unspecified: Secondary | ICD-10-CM

## 2017-12-03 DIAGNOSIS — M62830 Muscle spasm of back: Secondary | ICD-10-CM | POA: Diagnosis not present

## 2017-12-04 ENCOUNTER — Encounter: Payer: Self-pay | Admitting: Physical Therapy

## 2017-12-04 NOTE — Therapy (Signed)
[facility] [address] [place], [place], [address] Phone: [phone]   Fax:  [phone]  Physical Therapy Evaluation  Patient Details  Name: Lauren Avery MRN: [id number] Date of Birth: 1927-11-19 Referring Provider: Dr Learta Codding Date: 12/03/2017  PT End of Session - 12/03/17 1507    Visit Number  1    Number of Visits  16    Date for PT Re-Evaluation  01/28/18    Authorization Type  [employer] blue cross blue shield 40 dollar co pay     PT Start Time  1500    PT Stop Time  1545    PT Time Calculation (min)  45 min    Activity Tolerance  Patient tolerated treatment well    Behavior During Therapy  [facility] for tasks assessed/performed       Past Medical History:  Diagnosis Date  . Diabetes mellitus   . Fracture 10/2012   left wrist  . Fracture of left clavicle    12/12 wore a sling  . GERD (gastroesophageal reflux disease)   . Hypertension   . Osteoporosis   . Pulmonary fibrosis (HCC)     Past Surgical History:  Procedure Laterality Date  . FEMUR FRACTURE SURGERY      6/08 partial hip replacement right  . ganglion cyst removal     left wrist 1960  . HIP FRACTURE SURGERY     1984 pin and plate placed right  . HIP SURGERY     pin and plate removed 05/9628  . ORIF ELBOW FRACTURE Right 02/18/2013   Procedure: OPEN REDUCTION INTERNAL FIXATION (ORIF) ELBOW/OLECRANON FRACTURE;  Surgeon: Nadara Mustard, MD;  Location: [facility] OR;  Service: Orthopedics;  Laterality: Right;  . ORIF ELBOW FRACTURE Left 05/09/2015   Procedure: OPEN REDUCTION INTERNAL FIXATION (ORIF) LEFT MEDIAL HUMERAL CONDYLE FRACTURE;  Surgeon: Tarry Kos, MD;  Location: [facility] OR;  Service: Orthopedics;  Laterality: Left;    There were no vitals filed for this visit.   Subjective Assessment - 12/03/17 1504    Subjective  Patient had an onset of low back pain on the left side after bending down to put on her socks 3 months ago. She had an injection  whcih helped a bit but did not last. The pain is on the left side of her thoracic spine. It hurts when she lies down and when she truns.     Pertinent History  hip fracture 2008; pulmonary fibrosis; High blood pressure     Limitations  Standing;Walking;Sitting    Patient Stated Goals  to have less pain in her back     Currently in Pain?  Yes    Pain Orientation  Left    Pain Descriptors / Indicators  Aching    Pain Onset  More than a month ago    Pain Frequency  Constant         [facility] PT Assessment - 12/04/17 0001      Assessment   Medical Diagnosis  Low back pain     Referring Provider  Dr Gershon Mussel    Onset Date/Surgical Date  -- 3 months prior     Next MD Visit  Nothing scheduled     Prior Therapy  None      Precautions   Precautions  Fall    Precaution Comments  Uses a walker       Restrictions   Weight Bearing Restrictions  No  Balance Screen   Has the patient fallen in the past 6 months  No    Has the patient had a decrease in activity level because of a fear of falling?   No    Is the patient reluctant to leave their home because of a fear of falling?   No      Home Environment   Additional Comments  Has a ramp into her house       Prior Function   Level of Independence  Independent with household mobility with device    Vocation  Retired    Leisure  Driving the [employer]       Cognition   Overall Cognitive Status  Within Functional Limits for tasks assessed    Attention  Focused    Focused Attention  Appears intact    Memory  Appears intact    Awareness  Appears intact    Problem Solving  Appears intact      Observation/Other Assessments   Focus on Therapeutic Outcomes (FOTO)   66% limitation       Sensation   Additional Comments  Pain in her legs at times but does not appear to be related to her back      Coordination   Gross Motor Movements are Fluid and Coordinated  Yes    Fine Motor Movements are Fluid and Coordinated  Yes      AROM   Overall  AROM Comments  Lumbar range is functional for the lower back but limited by balance. Pain with left thoracic rotation and pain with thoracic extension. Pain when she sits in improved posture.       PROM   Overall PROM Comments  full PROM of the shoulders       Strength   Right Hip Flexion  4+/5    Right Hip ABduction  4+/5    Right Hip ADduction  4+/5    Left Hip Flexion  4+/5    Left Hip ABduction  4+/5    Left Hip ADduction  4+/5      Palpation   Palpation comment  tenderness to palaption on the left lower thoracic/ upper lumbar area       Bed Mobility   Bed Mobility  Supine to Sit;Sit to Supine    Supine to Sit  4: Min assist    Supine to Sit Details (indicate cue type and reason)  Min a to lie down at first. Shown how to lie down with proper log roll and had less pain. Can only lay on her left side 2nd to her hip. Same for sit to supine.     Sit to Supine  4: Min guard      Transfers   Comments  transfers sit to stand with 4 weheeled walker       Ambulation/Gait   Gait Comments  decreased hip flexion and stride length but this is baseline              Objective measurements completed on examination: See above findings.      [facility] Adult PT Treatment/Exercise - 12/04/17 0001      Self-Care   Self-Care  Other Self-Care Comments    Other Self-Care Comments   soft tissue mobilization shown to patients daughter for left side of the mid throaic area       Lumbar Exercises: Seated   Other Seated Lumbar Exercises  seated throacic rotation(posterio capsule with some rotation; Gentle throacic extnesion to neutral and  pain free x5; scap retraction x5; throacic side bending to the right in pain free range;                PT Education - 12/03/17 1507    Education provided  Yes    Education Details  reviewed symptom mangement and HEP     Person(s) Educated  Patient;Child(ren)    Methods  Explanation;Demonstration;Tactile cues;Verbal cues    Comprehension  Verbal  cues required;Verbalized understanding;Returned demonstration;Tactile cues required       PT Short Term Goals - 12/04/17 1141      PT SHORT TERM GOAL #1   Title  Patient will report 2/10 pain at worst in her mid/lower thoracic area     Time  4    Period  Weeks    Status  New    Target Date  01/01/18      PT SHORT TERM GOAL #2   Title  Patient will report no tenderness to palpation in her left/ mid/lower throacic area    Time  4    Period  Weeks    Status  New    Target Date  01/01/18      PT SHORT TERM GOAL #3   Title  Patient will be independent with basic HEP for strangth     Time  4    Period  Weeks    Status  New    Target Date  01/01/18        PT Long Term Goals - 12/04/17 1143      PT LONG TERM GOAL #1   Title  Patient will reach for item with left arm without pain     Time  8    Period  Weeks    Status  New    Target Date  01/29/18      PT LONG TERM GOAL #2   Title  Patient will lie down without pain     Time  8    Period  Weeks    Status  New    Target Date  01/29/18      PT LONG TERM GOAL #3   Title  {ateitn will turn upper body without pain     Time  8    Period  Weeks    Target Date  01/29/18             Plan - 12/04/17 1216    Clinical Impression Statement  Patient is an 84 year old female who presents with decreased mid/lower thoracic paraspinal pain and tightness. She is kyphotic and has osteoperosis. She has increased pain when she lies down or sits for too long. She also has pain bending over. She has poor posture. She was given light posttrual correction exercises. Therapy also instructed her daughter how to perfrom light soft tissue mobilization to the area.     Clinical Presentation  Stable    Clinical Decision Making  Low    Rehab Potential  Good    PT Frequency  2x / week    PT Duration  8 weeks    PT Treatment/Interventions  ADLs/Self Care Home Management;Cryotherapy;Psychiatrist;Iontophoresis 4mg /ml Dexamethasone;Therapeutic activities;Therapeutic exercise;Passive range of motion;Manual techniques;Dry needling;Taping    PT Next Visit Plan  continue with soft tissue mobilization to area. Add light resistancefor postrual exercises if able. review HEP     PT Home Exercise Plan  scap retraction, throacic side bend to the right; throacic extension to neutral and  pain foree. Throacic rotation stretch.     Consulted and Agree with Plan of Care  Patient       Patient will benefit from skilled therapeutic intervention in order to improve the following deficits and impairments:  Pain, Decreased activity tolerance, Decreased strength, Increased muscle spasms  Visit Diagnosis: Pain in thoracic spine  Acute left-sided low back pain without sciatica  Muscle spasm of back     Problem List Patient Active Problem List   Diagnosis Date Noted  . Physical deconditioning 09/23/2017  . Laryngopharyngeal reflux (LPR) 09/20/2015  . Fracture of elbow, medial condyle, left, closed 05/09/2015  . Fracture of medial condyle of elbow 05/09/2015  . Long term (current) use of anticoagulants 03/21/2013  . GERD (gastroesophageal reflux disease) 02/24/2013  . Constipation 02/24/2013  . Fracture of elbow, medial condyle, right, closed 02/19/2013    Class: Acute  . Closed fracture of single pubic ramus of pelvis (HCC) 02/19/2013    Class: Acute  . Bilateral sacral insufficiency fracture 02/19/2013    Class: Chronic  . IPF (idiopathic pulmonary fibrosis) (HCC) 02/24/2012  . Chronic cough 02/24/2012  . DM 11/12/2007  . Essential hypertension 11/12/2007    Dessie Coma PT DPT  12/04/2017, 12:23 PM  [facility] [address] [place], [place], [address] Phone: [phone]   Fax:  [phone]  Name: Lauren Avery MRN: [id number] Date of Birth: 08-05-1927

## 2017-12-10 ENCOUNTER — Ambulatory Visit: Payer: Medicare Other | Admitting: Physical Therapy

## 2017-12-10 DIAGNOSIS — M62830 Muscle spasm of back: Secondary | ICD-10-CM

## 2017-12-10 DIAGNOSIS — M545 Low back pain, unspecified: Secondary | ICD-10-CM

## 2017-12-10 DIAGNOSIS — M546 Pain in thoracic spine: Secondary | ICD-10-CM | POA: Diagnosis not present

## 2017-12-10 NOTE — Therapy (Signed)
[facility] [address] [place], [place], [address] Phone: [phone]   Fax:  [phone]  Physical Therapy Treatment  Patient Details  Name: Lauren Avery MRN: [id number] Date of Birth: Jul 03, 1927 Referring Provider: Dr Gershon Mussel   Encounter Date: 12/10/2017  PT End of Session - 12/10/17 1606    Visit Number  2    Number of Visits  16    Authorization Type  MCR blue cross blue shield 40 dollar co pay     PT Start Time  0300    PT Stop Time  0340    PT Time Calculation (min)  40 min    Activity Tolerance  Patient tolerated treatment well    Behavior During Therapy  [facility] for tasks assessed/performed       Past Medical History:  Diagnosis Date  . Diabetes mellitus   . Fracture 10/2012   left wrist  . Fracture of left clavicle    12/12 wore a sling  . GERD (gastroesophageal reflux disease)   . Hypertension   . Osteoporosis   . Pulmonary fibrosis (HCC)     Past Surgical History:  Procedure Laterality Date  . FEMUR FRACTURE SURGERY      6/08 partial hip replacement right  . ganglion cyst removal     left wrist 1960  . HIP FRACTURE SURGERY     1984 pin and plate placed right  . HIP SURGERY     pin and plate removed 03/4009  . ORIF ELBOW FRACTURE Right 02/18/2013   Procedure: OPEN REDUCTION INTERNAL FIXATION (ORIF) ELBOW/OLECRANON FRACTURE;  Surgeon: Nadara Mustard, MD;  [facility];  Service: Orthopedics;  Laterality: Right;  . ORIF ELBOW FRACTURE Left 05/09/2015   Procedure: OPEN REDUCTION INTERNAL FIXATION (ORIF) LEFT MEDIAL HUMERAL CONDYLE FRACTURE;  Surgeon: Tarry Kos, MD;  Location: [facility] OR;  Service: Orthopedics;  Laterality: Left;    There were no vitals filed for this visit.  Subjective Assessment - 12/10/17 1505    Subjective  Patient reports that her upper back is better but her lowet back has been stiff and sore. She also reports the the prothetic has come loose in her right hip. They can not  opperate because of the status of her bones.   (Pended)     Pertinent History  hip fracture 2008; pulmonary fibrosis; High blood pressure   (Pended)     Limitations  Standing;Walking;Sitting  (Pended)     Patient Stated Goals  to have less pain in her back   (Pended)                       [facility] Adult PT Treatment/Exercise - 12/10/17 0001      Transfers   Comments  Reviewed nose over toes, hand placement, weight shift, and foot placement with sit to stand transfer. Tried transfering from different levels off the elevated table. Minor pain felt with proper technique from lowest level. Will likley need follow up.       Lumbar Exercises: Stretches   Passive Hamstring Stretch Limitations  3X15 sec hold seated with mod cuing for technique     Lower Trunk Rotation Limitations  x10       Lumbar Exercises: Seated   Long Arc Quad on Chair  10 reps;2 sets      Manual Therapy   Manual Therapy  Soft tissue mobilization;Myofascial release    Soft tissue mobilization  trigger point release to the  left; reviewed self soft tissue mobilization with tennis ball              PT Education - 12/10/17 1606    Education provided  Yes    Education Details  transfers, exercise technique;     Person(s) Educated  Patient    Methods  Explanation;Demonstration;Tactile cues;Verbal cues    Comprehension  Verbalized understanding;Returned demonstration;Verbal cues required;Tactile cues required;Need further instruction       PT Short Term Goals - 12/04/17 1141      PT SHORT TERM GOAL #1   Title  Patient will report 2/10 pain at worst in her mid/lower thoracic area     Time  4    Period  Weeks    Status  New    Target Date  01/01/18      PT SHORT TERM GOAL #2   Title  Patient will report no tenderness to palpation in her left/ mid/lower throacic area    Time  4    Period  Weeks    Status  New    Target Date  01/01/18      PT SHORT TERM GOAL #3   Title  Patient will be independent  with basic HEP for strangth     Time  4    Period  Weeks    Status  New    Target Date  01/01/18        PT Long Term Goals - 12/04/17 1143      PT LONG TERM GOAL #1   Title  Patient will reach for item with left arm without pain     Time  8    Period  Weeks    Status  New    Target Date  01/29/18      PT LONG TERM GOAL #2   Title  Patient will lie down without pain     Time  8    Period  Weeks    Status  New    Target Date  01/29/18      PT LONG TERM GOAL #3   Title  {ateitn will turn upper body without pain     Time  8    Period  Weeks    Target Date  01/29/18            Plan - 12/10/17 1612    Clinical Impression Statement  Patient had a signiifacant trigger point on her left that released during soft tissue mobilization. The patient reportted feeling looser afterwards. Therapy also reviewed proper standing technique. With cuing the patient was able to stand up eaiser. She was given a light quad strengthening exercise with instruction to not allow her hip to hurt when she is doing it.. She may require mor eeducation on technique. She may also require furhter eduction for tehcniue with her hamstring stretch.     Clinical Presentation  Stable    Clinical Decision Making  Low    Rehab Potential  Good    PT Frequency  2x / week    PT Duration  8 weeks    PT Treatment/Interventions  ADLs/Self Care Home Management;Cryotherapy;Data processing manager;Iontophoresis 4mg /ml Dexamethasone;Therapeutic activities;Therapeutic exercise;Passive range of motion;Manual techniques;Dry needling;Taping    PT Next Visit Plan  continue with soft tissue mobilization to area. Add light resistancefor postrual exercises if able. review HEP     PT Home Exercise Plan  scap retraction, throacic side bend to the right; throacic extension to neutral and pain  foree. Throacic rotation stretch.     Consulted and Agree with Plan of Care  Patient       Patient  will benefit from skilled therapeutic intervention in order to improve the following deficits and impairments:  Pain, Decreased activity tolerance, Decreased strength, Increased muscle spasms  Visit Diagnosis: Pain in thoracic spine  Acute left-sided low back pain without sciatica  Muscle spasm of back     Problem List Patient Active Problem List   Diagnosis Date Noted  . Physical deconditioning 09/23/2017  . Laryngopharyngeal reflux (LPR) 09/20/2015  . Fracture of elbow, medial condyle, left, closed 05/09/2015  . Fracture of medial condyle of elbow 05/09/2015  . Long term (current) use of anticoagulants 03/21/2013  . GERD (gastroesophageal reflux disease) 02/24/2013  . Constipation 02/24/2013  . Fracture of elbow, medial condyle, right, closed 02/19/2013    Class: Acute  . Closed fracture of single pubic ramus of pelvis (HCC) 02/19/2013    Class: Acute  . Bilateral sacral insufficiency fracture 02/19/2013    Class: Chronic  . IPF (idiopathic pulmonary fibrosis) (HCC) 02/24/2012  . Chronic cough 02/24/2012  . DM 11/12/2007  . Essential hypertension 11/12/2007    Dessie Comaavid J Yosiah Jasmin 12/10/2017, 4:57 PM  [facility] [address] [place], [place], [address] Phone: [phone]   Fax:  [phone]  Name: Lauren Avery MRN: [id number] Date of Birth: [birth year]

## 2017-12-16 ENCOUNTER — Ambulatory Visit: Payer: Medicare Other | Admitting: Physical Therapy

## 2017-12-16 ENCOUNTER — Encounter: Payer: Self-pay | Admitting: Physical Therapy

## 2017-12-16 DIAGNOSIS — M545 Low back pain, unspecified: Secondary | ICD-10-CM

## 2017-12-16 DIAGNOSIS — M546 Pain in thoracic spine: Secondary | ICD-10-CM

## 2017-12-16 DIAGNOSIS — M62830 Muscle spasm of back: Secondary | ICD-10-CM | POA: Diagnosis not present

## 2017-12-17 ENCOUNTER — Encounter: Payer: Self-pay | Admitting: Physical Therapy

## 2017-12-17 NOTE — Therapy (Signed)
[facility] [address] [place], [place], [address] Phone: [phone]   Fax:  [phone]  Physical Therapy Treatment  Patient Details  Name: Lauren Avery MRN: [id number] Date of Birth: December 25, 1926 Referring Provider: Dr Gershon Mussel   Encounter Date: 12/16/2017  PT End of Session - 12/17/17 1248    Visit Number  3    Number of Visits  16    Date for PT Re-Evaluation  01/28/18    Authorization Type  MCR blue cross blue shield 40 dollar co pay     PT Start Time  1500    PT Stop Time  1542    PT Time Calculation (min)  42 min    Activity Tolerance  Patient tolerated treatment well    Behavior During Therapy  [facility] for tasks assessed/performed       Past Medical History:  Diagnosis Date  . Diabetes mellitus   . Fracture 10/2012   left wrist  . Fracture of left clavicle    12/12 wore a sling  . GERD (gastroesophageal reflux disease)   . Hypertension   . Osteoporosis   . Pulmonary fibrosis (HCC)     Past Surgical History:  Procedure Laterality Date  . FEMUR FRACTURE SURGERY      6/08 partial hip replacement right  . ganglion cyst removal     left wrist 1960  . HIP FRACTURE SURGERY     1984 pin and plate placed right  . HIP SURGERY     pin and plate removed 05/9628  . ORIF ELBOW FRACTURE Right 02/18/2013   Procedure: OPEN REDUCTION INTERNAL FIXATION (ORIF) ELBOW/OLECRANON FRACTURE;  Surgeon: Nadara Mustard, MD;  [facility];  Service: Orthopedics;  Laterality: Right;  . ORIF ELBOW FRACTURE Left 05/09/2015   Procedure: OPEN REDUCTION INTERNAL FIXATION (ORIF) LEFT MEDIAL HUMERAL CONDYLE FRACTURE;  Surgeon: Tarry Kos, MD;  [facility];  Service: Orthopedics;  Laterality: Left;    There were no vitals filed for this visit.  Subjective Assessment - 12/16/17 1504    Subjective  Patient reports her pain has been going back and forth between her back and her upper back. She reports it is better but stillt there.  She had pain this morning in her thoracic spine. She is stil having pain in her lewer back when she stands.     Pertinent History  hip fracture 2008; pulmonary fibrosis; High blood pressure     Limitations  Standing;Walking;Sitting    Patient Stated Goals  to have less pain in her back     Currently in Pain?  Yes    Pain Score  2     Pain Location  Arm                      [facility] Adult PT Treatment/Exercise - 12/17/17 1253      Lumbar Exercises: Stretches   Passive Hamstring Stretch Limitations  3X15 sec hold seated with mod cuing for technique       Lumbar Exercises: Seated   Long Arc Quad on Chair  10 reps;2 sets;Both    Other Seated Lumbar Exercises  seated thoracic rotation (posterior capsule stretch with some rotation; Gentle throacic extnesion to neutral and pain free x5; scap retraction x5; throacic side bending to the right in pain free range;         Manual Therapy   Manual Therapy  Soft tissue mobilization;Myofascial release    Soft tissue  mobilization  trigger point release to the left thoriac paraspinals ; reviewed self soft tissue mobilization with tennis ball  with education not to push too hard into her ribs.              PT Education - 12/17/17 1247    Education provided  Yes    Education Details  reviewed exercises and exercise technique     Person(s) Educated  Patient    Methods  Explanation;Demonstration;Tactile cues;Verbal cues    Comprehension  Verbalized understanding;Returned demonstration;Tactile cues required;Verbal cues required       PT Short Term Goals - 12/04/17 1141      PT SHORT TERM GOAL #1   Title  Patient will report 2/10 pain at worst in her mid/lower thoracic area     Time  4    Period  Weeks    Status  New    Target Date  01/01/18      PT SHORT TERM GOAL #2   Title  Patient will report no tenderness to palpation in her left/ mid/lower throacic area    Time  4    Period  Weeks    Status  New    Target Date   01/01/18      PT SHORT TERM GOAL #3   Title  Patient will be independent with basic HEP for strangth     Time  4    Period  Weeks    Status  New    Target Date  01/01/18        PT Long Term Goals - 12/04/17 1143      PT LONG TERM GOAL #1   Title  Patient will reach for item with left arm without pain     Time  8    Period  Weeks    Status  New    Target Date  01/29/18      PT LONG TERM GOAL #2   Title  Patient will lie down without pain     Time  8    Period  Weeks    Status  New    Target Date  01/29/18      PT LONG TERM GOAL #3   Title  {ateitn will turn upper body without pain     Time  8    Period  Weeks    Target Date  01/29/18            Plan - 12/16/17 1524    Clinical Impression Statement  Patient had tenderness to palpation in the left thoracic spine today. Therapy reviewed how to use the tennis ball to loosen the muscles. She reported no increase in pain with treatment. Therapy attempted to have patient perfrom table top prayer stretching but had difficulty with positioning.  Patient encouraged to continue with strengthening and stretching.     Clinical Presentation  Stable    Clinical Decision Making  Low    Rehab Potential  Good    PT Frequency  1x / week    PT Duration  8 weeks    PT Treatment/Interventions  ADLs/Self Care Home Management;Cryotherapy;Data processing managerlectrical Stimulation;Ultrasound;Gait training;Stair training;Iontophoresis 4mg /ml Dexamethasone;Therapeutic activities;Therapeutic exercise;Passive range of motion;Manual techniques;Dry needling;Taping    PT Next Visit Plan  continue with soft tissue mobilization to area. Add light resistancefor postrual exercises if able. review HEP     PT Home Exercise Plan  scap retraction, throacic side bend to the right; throacic extension to neutral and pain foree. Throacic  rotation stretch.     Consulted and Agree with Plan of Care  Patient       Patient will benefit from skilled therapeutic intervention in  order to improve the following deficits and impairments:  Pain, Decreased activity tolerance, Decreased strength, Increased muscle spasms  Visit Diagnosis: Pain in thoracic spine  Acute left-sided low back pain without sciatica  Muscle spasm of back     Problem List Patient Active Problem List   Diagnosis Date Noted  . Physical deconditioning 09/23/2017  . Laryngopharyngeal reflux (LPR) 09/20/2015  . Fracture of elbow, medial condyle, left, closed 05/09/2015  . Fracture of medial condyle of elbow 05/09/2015  . Long term (current) use of anticoagulants 03/21/2013  . GERD (gastroesophageal reflux disease) 02/24/2013  . Constipation 02/24/2013  . Fracture of elbow, medial condyle, right, closed 02/19/2013    Class: Acute  . Closed fracture of single pubic ramus of pelvis (HCC) 02/19/2013    Class: Acute  . Bilateral sacral insufficiency fracture 02/19/2013    Class: Chronic  . IPF (idiopathic pulmonary fibrosis) (HCC) 02/24/2012  . Chronic cough 02/24/2012  . DM 11/12/2007  . Essential hypertension 11/12/2007    Lauren Avery PT DPT  12/17/2017, 12:59 PM  [facility] [address] [place], [place], [address] Phone: [phone]   Fax:  [phone]  Name: Lauren Avery MRN: [id number] Date of Birth: 01-Feb-1927

## 2017-12-22 ENCOUNTER — Encounter: Payer: Self-pay | Admitting: Podiatry

## 2017-12-22 ENCOUNTER — Ambulatory Visit (INDEPENDENT_AMBULATORY_CARE_PROVIDER_SITE_OTHER): Payer: Medicare Other | Admitting: Podiatry

## 2017-12-22 DIAGNOSIS — B351 Tinea unguium: Secondary | ICD-10-CM | POA: Diagnosis not present

## 2017-12-22 DIAGNOSIS — M79676 Pain in unspecified toe(s): Secondary | ICD-10-CM

## 2017-12-22 DIAGNOSIS — E1142 Type 2 diabetes mellitus with diabetic polyneuropathy: Secondary | ICD-10-CM | POA: Diagnosis not present

## 2017-12-22 NOTE — Progress Notes (Signed)
Patient ID: Lauren Avery, female   DOB: 07/03/1927, 84 y.o.   MRN: [id number] Complaint:  Visit Type: Patient returns to my office for continued preventative foot care services. Complaint: Patient states" my nails have grown long and thick and become painful to walk and wear shoes" Patient has been diagnosed with DM with no foot complications. The patient presents for preventative foot care services. No changes to ROS  Podiatric Exam: Vascular: dorsalis pedis and posterior tibial pulses are palpable bilateral. Capillary return is immediate. Temperature gradient is WNL. Skin turgor WNL  Sensorium: Diminished  Semmes Weinstein monofilament test. Normal tactile sensation bilaterally. Nail Exam: Pt has thick disfigured discolored nails with subungual debris noted bilateral entire nail hallux through fifth toenails Ulcer Exam: There is no evidence of ulcer or pre-ulcerative changes or infection. Orthopedic Exam: Muscle tone and strength are WNL. No limitations in general ROM. No crepitus or effusions noted. Foot type and digits show no abnormalities. Bony prominences are unremarkable. Skin: No Porokeratosis. No infection or ulcers  Diagnosis:  Onychomycosis, , Pain in right toe, pain in left toes  Treatment & Plan Procedures and Treatment: Consent by patient was obtained for treatment procedures. The patient understood the discussion of treatment and procedures well. All questions were answered thoroughly reviewed. Debridement of mycotic and hypertrophic toenails, 1 through 5 bilateral and clearing of subungual debris. No ulceration, no infection noted.  Return Visit-Office Procedure: Patient instructed to return to the office for a follow up visit 3 months for continued evaluation and treatment.    Kerrie Latour DPM 

## 2017-12-23 ENCOUNTER — Ambulatory Visit: Payer: Medicare Other | Admitting: Physical Therapy

## 2017-12-23 DIAGNOSIS — M545 Low back pain, unspecified: Secondary | ICD-10-CM

## 2017-12-23 DIAGNOSIS — M62830 Muscle spasm of back: Secondary | ICD-10-CM

## 2017-12-23 DIAGNOSIS — M546 Pain in thoracic spine: Secondary | ICD-10-CM

## 2017-12-24 ENCOUNTER — Encounter: Payer: Self-pay | Admitting: Physical Therapy

## 2017-12-24 NOTE — Therapy (Signed)
[place], [place], [address] Phone: [phone]   Fax:  [phone]  Physical Therapy Treatment/ Discharge   Patient Details  Name: Lauren Avery MRN: [id number] Date of Birth: 09/28/1927 Referring Provider: Dr Frankey Shown   Encounter Date: 12/23/2017  PT End of Session - 12/24/17 1511    Visit Number  4    Number of Visits  16    Date for PT Re-Evaluation  01/28/18    Authorization Type  MCR blue cross blue shield 40 dollar co pay     PT Start Time  1500    PT Stop Time  1540    PT Time Calculation (min)  40 min    Activity Tolerance  Patient tolerated treatment well    Behavior During Therapy  [facility] for tasks assessed/performed       Past Medical History:  Diagnosis Date  . Diabetes mellitus   . Fracture 10/2012   left wrist  . Fracture of left clavicle    12/12 wore a sling  . GERD (gastroesophageal reflux disease)   . Hypertension   . Osteoporosis   . Pulmonary fibrosis ([place])     Past Surgical History:  Procedure Laterality Date  . FEMUR FRACTURE SURGERY      6/08 partial hip replacement right  . ganglion cyst removal     left wrist 1960  . HIP FRACTURE SURGERY     1984 pin and plate placed right  . HIP SURGERY     pin and plate removed 12/173  . ORIF ELBOW FRACTURE Right 02/18/2013   Procedure: OPEN REDUCTION INTERNAL FIXATION (ORIF) ELBOW/OLECRANON FRACTURE;  Surgeon: Newt Minion, MD;  Location: [place];  Service: Orthopedics;  Laterality: Right;  . ORIF ELBOW FRACTURE Left 05/09/2015   Procedure: OPEN REDUCTION INTERNAL FIXATION (ORIF) LEFT MEDIAL HUMERAL CONDYLE FRACTURE;  Surgeon: Leandrew Koyanagi, MD;  Location: [place];  Service: Orthopedics;  Laterality: Left;    There were no vitals filed for this visit.  Subjective Assessment - 12/24/17 1507    Subjective  Patient reports her back and thoracic/ scpaular area have been doing well. she still has pain but overall the pain has  been intermittent and less frequent. She can use her exercises to get out of pain. She continues to have pain in her hip. The hip pain is likley baseline. It may also be tied into her lower back pain.      Pertinent History  hip fracture 2008; pulmonary fibrosis; High blood pressure     Currently in Pain?  No/denies                      [facility] Adult PT Treatment/Exercise - 12/24/17 0001      Lumbar Exercises: Seated   Long Arc Quad on Chair  10 reps;2 sets;Both    Other Seated Lumbar Exercises  seated heel raise/ toe raise       Knee/Hip Exercises: Supine   Quad Sets Limitations  x10 with glut set x10 seated or supine. Patient advised if it is difficult to get into supine just do them sitting.     Other Supine Knee/Hip Exercises  ball squeeze with core contraction; hip abduction to neutral with band. significant cuing not to go to far or squeeze too hard withboth exercidses.              PT Education - 12/24/17 1510    Education provided  Yes    Education Details  reviewed light lower extremity strengthening for continued mobility     Person(s) Educated  Patient    Methods  Explanation;Demonstration;Tactile cues;Verbal cues    Comprehension  Verbalized understanding;Returned demonstration;Verbal cues required;Tactile cues required;Need further instruction       PT Short Term Goals - 12/24/17 1515      PT SHORT TERM GOAL #1   Title  Patient will report 2/10 pain at worst in her mid/lower thoracic area     Time  4    Period  Weeks    Status  On-going      PT SHORT TERM GOAL #2   Title  Patient will report no tenderness to palpation in her left/ mid/lower throacic area    Time  4    Period  Weeks    Status  On-going      PT SHORT TERM GOAL #3   Title  Patient will be independent with basic HEP for strangth     Time  4    Period  Weeks    Status  On-going        PT Long Term Goals - 12/04/17 1143      PT LONG TERM GOAL #1   Title  Patient will reach  for item with left arm without pain     Time  8    Period  Weeks    Status  New    Target Date  01/29/18      PT LONG TERM GOAL #2   Title  Patient will lie down without pain     Time  8    Period  Weeks    Status  New    Target Date  01/29/18      PT LONG TERM GOAL #3   Title  {ateitn will turn upper body without pain     Time  8    Period  Weeks    Target Date  01/29/18            Plan - 12/24/17 1511    Clinical Impression Statement  Patient has reached all golas for therapy. She still has intermittent pain but the pain is managed with exercises for lower back and mid thoracic area. She reports right hip pain and weakness but this is baseline. Therapy gave her light stregthening exercises with adivce to follow symptoms, keep in low ranges, and stop immedietly if she feels pain in her hip.     Clinical Presentation  Stable    Clinical Decision Making  Low    Rehab Potential  Good    PT Frequency  1x / week    PT Duration  8 weeks    PT Treatment/Interventions  ADLs/Self Care Home Management;Cryotherapy;Teaching laboratory technician;Iontophoresis '4mg'$ /ml Dexamethasone;Therapeutic activities;Therapeutic exercise;Passive range of motion;Manual techniques;Dry needling;Taping    PT Next Visit Plan  continue with soft tissue mobilization to area. Add light resistancefor postrual exercises if able. review HEP     PT Home Exercise Plan  scap retraction, throacic side bend to the right; throacic extension to neutral and pain foree. Throacic rotation stretch.     Consulted and Agree with Plan of Care  Patient       Patient will benefit from skilled therapeutic intervention in order to improve the following deficits and impairments:  Pain, Decreased activity tolerance, Decreased strength, Increased muscle spasms  Visit Diagnosis: Pain in thoracic spine  Acute left-sided low back pain without sciatica  Muscle spasm of back   PHYSICAL THERAPY  DISCHARGE SUMMARY  Visits from Start of Care: 4   Current functional level related to goals / functional outcomes: Improved low back and scapular pain    Remaining deficits: Right hip pain    Education / Equipment: HEP  Plan: Patient agrees to discharge.  Patient goals were met. Patient is being discharged due to meeting the stated rehab goals.  ?????       Problem List Patient Active Problem List   Diagnosis Date Noted  . Physical deconditioning 09/23/2017  . Laryngopharyngeal reflux (LPR) 09/20/2015  . Fracture of elbow, medial condyle, left, closed 05/09/2015  . Fracture of medial condyle of elbow 05/09/2015  . Long term (current) use of anticoagulants 03/21/2013  . GERD (gastroesophageal reflux disease) 02/24/2013  . Constipation 02/24/2013  . Fracture of elbow, medial condyle, right, closed 02/19/2013    Class: Acute  . Closed fracture of single pubic ramus of pelvis ([place]) 02/19/2013    Class: Acute  . Bilateral sacral insufficiency fracture 02/19/2013    Class: Chronic  . IPF (idiopathic pulmonary fibrosis) ([place]) 02/24/2012  . Chronic cough 02/24/2012  . DM 11/12/2007  . Essential hypertension 11/12/2007    Carney Living 12/24/2017, 3:16 PM  [facility] [address] [place], [place], [address] Phone: [phone]   Fax:  [phone]  Name: Lauren Avery MRN: [id number] Date of Birth: [birth year]

## 2018-01-12 ENCOUNTER — Telehealth: Payer: Self-pay | Admitting: Pulmonary Disease

## 2018-01-12 DIAGNOSIS — J029 Acute pharyngitis, unspecified: Secondary | ICD-10-CM | POA: Diagnosis not present

## 2018-01-12 DIAGNOSIS — Z6822 Body mass index (BMI) 22.0-22.9, adult: Secondary | ICD-10-CM | POA: Diagnosis not present

## 2018-01-12 DIAGNOSIS — J189 Pneumonia, unspecified organism: Secondary | ICD-10-CM | POA: Diagnosis not present

## 2018-01-12 DIAGNOSIS — R05 Cough: Secondary | ICD-10-CM | POA: Diagnosis not present

## 2018-01-12 NOTE — Telephone Encounter (Signed)
Patient's daughter Rosey Batheresa just called back.  Stating she had spoken with nurse about Dr. Kriste BasqueNadel possibly calling in another med for the patient for being sick.  Rosey Batheresa is calling to let us know that [facility] now [facility], so just needing the Tramadol RX sent in.  CB if needed is [phone].

## 2018-01-12 NOTE — Telephone Encounter (Signed)
[facility] is going to [facility] 01[facility]12/18  to see Dr Wylene Simmerisovec. Per SN he is not refilling Tramadol wants Patient to follow up with Dr Tisovec/PCP. Called and spoke with daughter Rosey Batheresa and she stated understanding.  Informed her to call if she had any further questions. Nothing further needed.

## 2018-01-12 NOTE — Telephone Encounter (Signed)
Per

## 2018-01-12 NOTE — Telephone Encounter (Signed)
Per Lauren Avery, Dr. Wylene Simmerisovec doesn't want to refill the Tramadol because it was prescribed by SN for pulm fibrosis. Lauren Avery says there was a CXR sent to SN showing some "spots" and placed the ABX for possible PNA. Would like to know if we can refill tramadol and SN advice on these "spots". Cb is [phone].

## 2018-01-12 NOTE — Telephone Encounter (Signed)
Called spoke with patient's daughter Lauren Avery who reported that patient is out of her Tramadol and is requesting refills.  Per Lauren Avery pt had been taking 1 whole tab by mouth TID but has recently reduced this down to 1/2 tab in the morning and at bedtime.  Pt denied any increased pain but did note some increased cough with no change in her clear mucus production.  Tramadol last refill was April 2018 #90 with 5 refills Last ov w/ SN 10.24.18  Dr Kriste BasqueNadel please advise if okay to refill or if you would like to defer to pt's PCP Dr Wylene Simmerisovec  Per last ov: Patient Instructions  Today we updated your med list in our EPIC system...    Continue your current medications the same...   Today we rechecked your CXR to compare to last yrs film...    We will contact you w/ the results when available...    We discussed options for adjusting your current meds (Baclofen, Tramadol, Advil, Tylenol) to get some relief of your back pain...   Be sure to notify DrTisovec of your situation & sched a follow up w/ him to supervise your back pain recovery...   Call for any questions or if I can be of assistance in any way...   Let's plan a follow up visit in 40mo, sooner if needed for any breathing problems...     Lauren Avery will be leaving home, okay to call on cell phone: [phone]

## 2018-01-12 NOTE — Telephone Encounter (Signed)
SN please advise on this, thank you.    Current Outpatient Medications on File Prior to Visit  Medication Sig Dispense Refill  . acetaminophen (TYLENOL) 325 MG tablet Take 2 tablets (650 mg total) by mouth every 6 (six) hours as needed. (Patient taking differently: Take 650 mg by mouth every 6 (six) hours as needed for mild pain, fever or headache. )    . ADVAIR DISKUS 500-50 MCG/DOSE AEPB INHALE 1 PUFF INTO LUNGS TWICE DAILY 60 each 5  . amLODipine (NORVASC) 5 MG tablet Take 5 mg by mouth every other day.     . baclofen (LIORESAL) 10 MG tablet Take 1 tablet (10 mg total) by mouth 3 (three) times daily as needed for muscle spasms. (Patient not taking: Reported on 12/03/2017) 30 each 2  . glimepiride (AMARYL) 2 MG tablet Take 4 mg by mouth daily before breakfast.  30 tablet 0  . losartan (COZAAR) 50 MG tablet Take 50 mg by mouth daily.    . Multiple Vitamins-Minerals (MULTIVITAMIN WITH MINERALS) tablet Take 1 tablet by mouth daily.    . pantoprazole (PROTONIX) 40 MG tablet Take 1 tablet (40 mg total) by mouth 2 (two) times daily. 60 tablet 6  . predniSONE (STERAPRED UNI-PAK 21 TAB) 10 MG (21) TBPK tablet Take as directed (Patient not taking: Reported on 10/27/2017) 21 tablet 0  . traMADol (ULTRAM) 50 MG tablet Take 1/2-1 tablet three times daily as directed for cough 90 tablet 5  . Zoledronic Acid (RECLAST IV) Inject into the vein. yearly    . [DISCONTINUED] Calcium Carbonate-Vitamin D (CALCIUM-VITAMIN D) 500-200 MG-UNIT per tablet Take 1 tablet by mouth 2 (two) times daily with a meal.     No current facility-administered medications on file prior to visit.    Allergies  Allergen Reactions  . Hydrochlorothiazide Other (See Comments)    Taken from faxed notes of dr Wylene Simmertisovec (to short stay)  THROMBOCYTOPENIA  . Prilosec [Omeprazole] Diarrhea

## 2018-01-13 MED ORDER — TRAMADOL HCL 50 MG PO TABS: ORAL_TABLET | ORAL | 3 refills | Status: DC

## 2018-01-13 NOTE — Telephone Encounter (Signed)
Per SN Tramadol 50mg  #90 1/2-1 tab by mouth 3 times per day as needed for pain, refill x's 3  [facility] Called and spoke with daughter Rosey Batheresa about prescription. She stated understanding.Instructed her to call if she has any questions or concerns. Nothing further needed.

## 2018-01-18 ENCOUNTER — Telehealth: Payer: Self-pay | Admitting: Pulmonary Disease

## 2018-01-18 NOTE — Telephone Encounter (Signed)
Called and spoke with Dayna Barkeramesh from pharmacy. He states that medication was not received from us. Gave verbal of medication from phone note per Misty StanleyLisa on 2.13. Nothing further needed.

## 2018-01-21 ENCOUNTER — Encounter: Payer: Self-pay | Admitting: Pulmonary Disease

## 2018-01-21 ENCOUNTER — Ambulatory Visit: Payer: Medicare Other | Admitting: Pulmonary Disease

## 2018-01-21 VITALS — BP 120/68 | HR 104 | Temp 97.5°F | Ht 62.0 in | Wt 115.2 lb

## 2018-01-21 DIAGNOSIS — K219 Gastro-esophageal reflux disease without esophagitis: Secondary | ICD-10-CM

## 2018-01-21 DIAGNOSIS — J84112 Idiopathic pulmonary fibrosis: Secondary | ICD-10-CM

## 2018-01-21 DIAGNOSIS — R5381 Other malaise: Secondary | ICD-10-CM

## 2018-01-21 DIAGNOSIS — R053 Chronic cough: Secondary | ICD-10-CM

## 2018-01-21 DIAGNOSIS — R05 Cough: Secondary | ICD-10-CM

## 2018-01-21 MED ORDER — TRAMADOL HCL 50 MG PO TABS: ORAL_TABLET | ORAL | 5 refills | Status: DC

## 2018-01-21 NOTE — Patient Instructions (Signed)
Today we updated your med list in our EPIC system...    Continue your current medications the same...  We refilled your TRAMADOL 50mg  tabs--    Take 1/2 to 1 tab up to 3 times daily as needed for the cough...  Continue the Adviair twice daily & call me for other options when you hit the "donut hole"    Be sure to rinse/ brush after using your inhaler...  Call for any questions...  Let's plan a follow up visit in about 6months, sooner if needed for problems.Marland Kitchen..Marland Kitchen

## 2018-03-01 ENCOUNTER — Encounter (INDEPENDENT_AMBULATORY_CARE_PROVIDER_SITE_OTHER): Payer: Self-pay | Admitting: Orthopaedic Surgery

## 2018-03-01 ENCOUNTER — Ambulatory Visit (INDEPENDENT_AMBULATORY_CARE_PROVIDER_SITE_OTHER): Payer: Medicare Other | Admitting: Orthopaedic Surgery

## 2018-03-01 ENCOUNTER — Ambulatory Visit (INDEPENDENT_AMBULATORY_CARE_PROVIDER_SITE_OTHER): Payer: Medicare Other

## 2018-03-01 DIAGNOSIS — M25551 Pain in right hip: Secondary | ICD-10-CM | POA: Diagnosis not present

## 2018-03-01 MED ORDER — BUPIVACAINE HCL 0.5 % IJ SOLN
3.0000 mL | INTRAMUSCULAR | Status: AC | PRN
Start: 2018-03-01 — End: 2018-03-01
  Administered 2018-03-01: 3 mL via INTRA_ARTICULAR

## 2018-03-01 MED ORDER — LIDOCAINE HCL 1 % IJ SOLN
3.0000 mL | INTRAMUSCULAR | Status: AC | PRN
  Administered 2018-03-01: 3 mL

## 2018-03-01 MED ORDER — METHYLPREDNISOLONE ACETATE 40 MG/ML IJ SUSP
40.0000 mg | INTRAMUSCULAR | Status: AC | PRN
  Administered 2018-03-01: 40 mg via INTRA_ARTICULAR

## 2018-03-01 NOTE — Progress Notes (Signed)
Office Visit Note   Patient: Lauren Avery           Date of Birth: 25-Aug-1927           MRN: [id number] Visit Date: 03/01/2018              Requested by: Gaspar Garbe, MD [address] [place], [place] [address] PCP: Gaspar Garbe, MD   Assessment & Plan: Visit Diagnoses:  1. 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  As we discussed before patient is 84 years old and I do not think that she would be able to tolerate a full hip revision but unfortunately this will limit her from a functional standpoint permanently.  For the trochanteric bursitis we did perform an injection today.  Home exercises were provided today.  Questions encouraged and answered.  Follow-up as needed.  Follow-Up Instructions: Return if symptoms worsen or fail to improve.   Orders:  Orders Placed This Encounter  Procedures  . XR HIP UNILAT W OR W/O PELVIS 1V RIGHT   No orders of the defined types were placed in this encounter.     Procedures: Large Joint Inj: R greater trochanter on 03/01/2018 11:46 AM Indications: pain Details: 22 G needle  Arthrogram: No  Medications: 3 mL lidocaine 1 %; 3 mL bupivacaine 0.5 %; 40 mg methylPREDNISolone acetate 40 MG/ML Patient was prepped and draped in the usual sterile fashion.       Clinical Data: No additional findings.   Subjective: Chief Complaint  Patient presents with  . Right Hip - Pain    Ms. Pittinger is a very pleasant 84 year old female comes in with progressively worsening right hip pain that is tender to palpation.  She states that she is able to weight-bear through the right leg with a walker and does not have any thigh pain that is related to the weightbearing.  She has a lot of pain with direct palpation over the lateral side of the hip.  Denies any radicular symptoms.   Review of Systems  Constitutional:  Negative.   HENT: Negative.   Eyes: Negative.   Respiratory: Negative.   Cardiovascular: Negative.   Endocrine: Negative.   Musculoskeletal: Negative.   Neurological: Negative.   Hematological: Negative.   Psychiatric/Behavioral: Negative.   All other systems reviewed and are negative.    Objective: Vital Signs: There were no vitals taken for this visit.  Physical Exam  Constitutional: She is oriented to person, place, and time. She appears well-developed and well-nourished.  Pulmonary/Chest: Effort normal.  Neurological: She is alert and oriented to person, place, and time.  Skin: Skin is warm. Capillary refill takes less than 2 seconds.  Psychiatric: She has a normal mood and affect. Her behavior is normal. Judgment and thought content normal.  Nursing note and vitals reviewed.   Ortho Exam Right hip exam shows a fully healed surgical scar.  Her trochanteric bursa is very tender to palpation.  She does not have any significant discomfort or pain with rotation of her hip or with axial compression Specialty Comments:  No specialty comments available.  Imaging: Xr Hip Unilat W Or W/o Pelvis 1v Right  Result Date: 03/01/2018 Progressive loosening of femoral component with subsidence.  There is no evidence of acute fracture    PMFS History: Patient Active Problem List   Diagnosis Date Noted  . Physical deconditioning 09/23/2017  .  Laryngopharyngeal reflux (LPR) 09/20/2015  . Fracture of elbow, medial condyle, left, closed 05/09/2015  . Fracture of medial condyle of elbow 05/09/2015  . Long term (current) use of anticoagulants 03/21/2013  . GERD (gastroesophageal reflux disease) 02/24/2013  . Constipation 02/24/2013  . Fracture of elbow, medial condyle, right, closed 02/19/2013    Class: Acute  . Closed fracture of single pubic ramus of pelvis (HCC) 02/19/2013    Class: Acute  . Bilateral sacral insufficiency fracture 02/19/2013    Class: Chronic  . IPF (idiopathic  pulmonary fibrosis) (HCC) 02/24/2012  . Chronic cough 02/24/2012  . DM 11/12/2007  . Essential hypertension 11/12/2007   Past Medical History:  Diagnosis Date  . Diabetes mellitus   . Fracture 10/2012   left wrist  . Fracture of left clavicle    12/12 wore a sling  . GERD (gastroesophageal reflux disease)   . Hypertension   . Osteoporosis   . Pulmonary fibrosis (HCC)     Family History  Problem Relation Age of Onset  . Pancreatic cancer Brother   . Stroke Mother   . Diabetes Father     Past Surgical History:  Procedure Laterality Date  . FEMUR FRACTURE SURGERY      6/08 partial hip replacement right  . ganglion cyst removal     left wrist 1960  . HIP FRACTURE SURGERY     1984 pin and plate placed right  . HIP SURGERY     pin and plate removed 1/61098/1985  . ORIF ELBOW FRACTURE Right 02/18/2013   Procedure: OPEN REDUCTION INTERNAL FIXATION (ORIF) ELBOW/OLECRANON FRACTURE;  Surgeon: Nadara MustardMarcus V Duda, MD;  Location: [facility] OR;  Service: Orthopedics;  Laterality: Right;  . ORIF ELBOW FRACTURE Left 05/09/2015   Procedure: OPEN REDUCTION INTERNAL FIXATION (ORIF) LEFT MEDIAL HUMERAL CONDYLE FRACTURE;  Surgeon: Tarry KosNaiping M Jaisean Monteforte, MD;  Location: [facility] OR;  Service: Orthopedics;  Laterality: Left;   Social History   Occupational History  . Occupation: retired Airline pilotaccountant  Tobacco Use  . Smoking status: Never Smoker  . Smokeless tobacco: Never Used  Substance and Sexual Activity  . Alcohol use: No  . Drug use: No  . Sexual activity: Not on file

## 2018-03-03 DIAGNOSIS — R05 Cough: Secondary | ICD-10-CM | POA: Diagnosis not present

## 2018-03-03 DIAGNOSIS — R82998 Other abnormal findings in urine: Secondary | ICD-10-CM | POA: Diagnosis not present

## 2018-03-03 DIAGNOSIS — J8410 Pulmonary fibrosis, unspecified: Secondary | ICD-10-CM | POA: Diagnosis not present

## 2018-03-03 DIAGNOSIS — E1142 Type 2 diabetes mellitus with diabetic polyneuropathy: Secondary | ICD-10-CM | POA: Diagnosis not present

## 2018-03-03 DIAGNOSIS — J189 Pneumonia, unspecified organism: Secondary | ICD-10-CM | POA: Diagnosis not present

## 2018-03-15 ENCOUNTER — Ambulatory Visit (INDEPENDENT_AMBULATORY_CARE_PROVIDER_SITE_OTHER): Payer: Medicare Other | Admitting: Orthopaedic Surgery

## 2018-03-15 ENCOUNTER — Encounter (INDEPENDENT_AMBULATORY_CARE_PROVIDER_SITE_OTHER): Payer: Self-pay | Admitting: Orthopaedic Surgery

## 2018-03-15 DIAGNOSIS — M25511 Pain in right shoulder: Secondary | ICD-10-CM | POA: Diagnosis not present

## 2018-03-15 MED ORDER — LIDOCAINE HCL 1 % IJ SOLN
3.0000 mL | INTRAMUSCULAR | Status: AC | PRN
  Administered 2018-03-15: 3 mL

## 2018-03-15 MED ORDER — METHYLPREDNISOLONE ACETATE 40 MG/ML IJ SUSP
40.0000 mg | INTRAMUSCULAR | Status: AC | PRN
  Administered 2018-03-15: 40 mg via INTRA_ARTICULAR

## 2018-03-15 MED ORDER — BUPIVACAINE HCL 0.5 % IJ SOLN
3.0000 mL | INTRAMUSCULAR | Status: AC | PRN
  Administered 2018-03-15: 3 mL via INTRA_ARTICULAR

## 2018-03-15 NOTE — Progress Notes (Signed)
Office Visit Note   Patient: Lauren Avery           Date of Birth: 12/14/1926           MRN: [id number] Visit Date: 03/15/2018              Requested by: Gaspar Garbeisovec, Richard W, MD [address] [place], [place] [id number] PCP: Gaspar Garbeisovec, Richard W, MD   Assessment & Plan: Visit Diagnoses:  1. Acute pain of right shoulder     Plan: Impression is a 84 year old female with right shoulder pain likely from overuse.  I did perform an intra-articular injection of the right shoulder today.  Hopefully this will give her some relief.  Patient instructed to follow-up if no improvement over the next couple weeks.  Follow-Up Instructions: Return if symptoms worsen or fail to improve.   Orders:  No orders of the defined types were placed in this encounter.  No orders of the defined types were placed in this encounter.     Procedures: Large Joint Inj: R glenohumeral on 03/15/2018 2:55 PM Indications: pain Details: 22 G needle  Arthrogram: No  Medications: 3 mL lidocaine 1 %; 3 mL bupivacaine 0.5 %; 40 mg methylPREDNISolone acetate 40 MG/ML Outcome: tolerated well, no immediate complications Consent was given by the patient. Patient was prepped and draped in the usual sterile fashion.       Clinical Data: No additional findings.   Subjective: Chief Complaint  Patient presents with  . Right Shoulder - Pain    Patient is a very pleasant 84 year old female comes in with acute right shoulder pain for about a week.  She has had to use her arms more so recently because of a bad right hip that is non-op.  She went to the urgent care last week and x-rays were by report negative.  She has felt somewhat better since last week.  Denies any numbness or tingling or radicular symptoms.  Denies any trauma.   Review of Systems  Constitutional: Negative.   HENT: Negative.   Eyes: Negative.   Respiratory: Negative.   Cardiovascular: Negative.   Endocrine: Negative.   Musculoskeletal:  Negative.   Neurological: Negative.   Hematological: Negative.   Psychiatric/Behavioral: Negative.   All other systems reviewed and are negative.    Objective: Vital Signs: There were no vitals taken for this visit.  Physical Exam  Constitutional: She is oriented to person, place, and time. She appears well-developed and well-nourished.  Pulmonary/Chest: Effort normal.  Neurological: She is alert and oriented to person, place, and time.  Skin: Skin is warm. Capillary refill takes less than 2 seconds.  Psychiatric: She has a normal mood and affect. Her behavior is normal. Judgment and thought content normal.  Nursing note and vitals reviewed.   Ortho Exam Right shoulder exam overall shows good range of motion.  Infraspinatus testing is normal.  Supraspinatus testing is difficult secondary to guarding and pain.  Negative impingement. Specialty Comments:  No specialty comments available.  Imaging: No results found.   PMFS History: Patient Active Problem List   Diagnosis Date Noted  . Physical deconditioning 09/23/2017  . Laryngopharyngeal reflux (LPR) 09/20/2015  . Fracture of elbow, medial condyle, left, closed 05/09/2015  . Fracture of medial condyle of elbow 05/09/2015  . Long term (current) use of anticoagulants 03/21/2013  . GERD (gastroesophageal reflux disease) 02/24/2013  . Constipation 02/24/2013  . Fracture of elbow, medial condyle, right, closed 02/19/2013    Class: Acute  . Closed  fracture of single pubic ramus of pelvis (HCC) 02/19/2013    Class: Acute  . Bilateral sacral insufficiency fracture 02/19/2013    Class: Chronic  . IPF (idiopathic pulmonary fibrosis) (HCC) 02/24/2012  . Chronic cough 02/24/2012  . DM 11/12/2007  . Essential hypertension 11/12/2007   Past Medical History:  Diagnosis Date  . Diabetes mellitus   . Fracture 10/2012   left wrist  . Fracture of left clavicle    12/12 wore a sling  . GERD (gastroesophageal reflux disease)   .  Hypertension   . Osteoporosis   . Pulmonary fibrosis (HCC)     Family History  Problem Relation Age of Onset  . Pancreatic cancer Brother   . Stroke Mother   . Diabetes Father     Past Surgical History:  Procedure Laterality Date  . FEMUR FRACTURE SURGERY      6/08 partial hip replacement right  . ganglion cyst removal     left wrist 1960  . HIP FRACTURE SURGERY     1984 pin and plate placed right  . HIP SURGERY     pin and plate removed 12/6107  . ORIF ELBOW FRACTURE Right 02/18/2013   Procedure: OPEN REDUCTION INTERNAL FIXATION (ORIF) ELBOW/OLECRANON FRACTURE;  Surgeon: Nadara Mustard, MD;  Location: [facility] OR;  Service: Orthopedics;  Laterality: Right;  . ORIF ELBOW FRACTURE Left 05/09/2015   Procedure: OPEN REDUCTION INTERNAL FIXATION (ORIF) LEFT MEDIAL HUMERAL CONDYLE FRACTURE;  Surgeon: Tarry Kos, MD;  Location: [facility] OR;  Service: Orthopedics;  Laterality: Left;   Social History   Occupational History  . Occupation: retired Airline pilot  Tobacco Use  . Smoking status: Never Smoker  . Smokeless tobacco: Never Used  Substance and Sexual Activity  . Alcohol use: No  . Drug use: No  . Sexual activity: Not on file

## 2018-03-23 ENCOUNTER — Ambulatory Visit (INDEPENDENT_AMBULATORY_CARE_PROVIDER_SITE_OTHER): Payer: Medicare Other | Admitting: Podiatry

## 2018-03-23 ENCOUNTER — Encounter: Payer: Self-pay | Admitting: Podiatry

## 2018-03-23 DIAGNOSIS — M79676 Pain in unspecified toe(s): Secondary | ICD-10-CM | POA: Diagnosis not present

## 2018-03-23 DIAGNOSIS — E1142 Type 2 diabetes mellitus with diabetic polyneuropathy: Secondary | ICD-10-CM

## 2018-03-23 DIAGNOSIS — B351 Tinea unguium: Secondary | ICD-10-CM | POA: Diagnosis not present

## 2018-03-23 NOTE — Progress Notes (Signed)
Patient ID: Lauren Avery, female   DOB: 12/30/1926, 84 y.o.   MRN: [id number] Complaint:  Visit Type: Patient returns to my office for continued preventative foot care services. Complaint: Patient states" my nails have grown long and thick and become painful to walk and wear shoes" Patient has been diagnosed with DM with no foot complications. The patient presents for preventative foot care services. No changes to ROS  Podiatric Exam: Vascular: dorsalis pedis and posterior tibial pulses are palpable bilateral. Capillary return is immediate. Temperature gradient is WNL. Skin turgor WNL  Sensorium: Diminished  Semmes Weinstein monofilament test. Normal tactile sensation bilaterally. Nail Exam: Pt has thick disfigured discolored nails with subungual debris noted bilateral entire nail hallux through fifth toenails Ulcer Exam: There is no evidence of ulcer or pre-ulcerative changes or infection. Orthopedic Exam: Muscle tone and strength are WNL. No limitations in general ROM. No crepitus or effusions noted. Foot type and digits show no abnormalities. HAV  B/L. Skin: No Porokeratosis. No infection or ulcers  Diagnosis:  Onychomycosis, , Pain in right toe, pain in left toes  Treatment & Plan Procedures and Treatment: Consent by patient was obtained for treatment procedures. The patient understood the discussion of treatment and procedures well. All questions were answered thoroughly reviewed. Debridement of mycotic and hypertrophic toenails, 1 through 5 bilateral and clearing of subungual debris. No ulceration, no infection noted.  Return Visit-Office Procedure: Patient instructed to return to the office for a follow up visit 10 weeks  for continued evaluation and treatment.    Jesika Men DPM 

## 2018-03-24 ENCOUNTER — Ambulatory Visit: Payer: Medicare Other | Admitting: Pulmonary Disease

## 2018-04-01 DIAGNOSIS — E1165 Type 2 diabetes mellitus with hyperglycemia: Secondary | ICD-10-CM | POA: Diagnosis not present

## 2018-04-01 DIAGNOSIS — E1129 Type 2 diabetes mellitus with other diabetic kidney complication: Secondary | ICD-10-CM | POA: Diagnosis not present

## 2018-04-01 DIAGNOSIS — E1142 Type 2 diabetes mellitus with diabetic polyneuropathy: Secondary | ICD-10-CM | POA: Diagnosis not present

## 2018-04-01 DIAGNOSIS — N183 Chronic kidney disease, stage 3 (moderate): Secondary | ICD-10-CM | POA: Diagnosis not present

## 2018-04-05 ENCOUNTER — Ambulatory Visit (INDEPENDENT_AMBULATORY_CARE_PROVIDER_SITE_OTHER): Payer: Medicare Other | Admitting: Orthopaedic Surgery

## 2018-04-15 DIAGNOSIS — I10 Essential (primary) hypertension: Secondary | ICD-10-CM | POA: Diagnosis not present

## 2018-04-15 DIAGNOSIS — E119 Type 2 diabetes mellitus without complications: Secondary | ICD-10-CM | POA: Diagnosis not present

## 2018-04-15 DIAGNOSIS — E1165 Type 2 diabetes mellitus with hyperglycemia: Secondary | ICD-10-CM | POA: Diagnosis not present

## 2018-04-15 DIAGNOSIS — Z6820 Body mass index (BMI) 20.0-20.9, adult: Secondary | ICD-10-CM | POA: Diagnosis not present

## 2018-04-15 DIAGNOSIS — N183 Chronic kidney disease, stage 3 (moderate): Secondary | ICD-10-CM | POA: Diagnosis not present

## 2018-05-27 DIAGNOSIS — N183 Chronic kidney disease, stage 3 (moderate): Secondary | ICD-10-CM | POA: Diagnosis not present

## 2018-05-27 DIAGNOSIS — E1165 Type 2 diabetes mellitus with hyperglycemia: Secondary | ICD-10-CM | POA: Diagnosis not present

## 2018-05-27 DIAGNOSIS — Z794 Long term (current) use of insulin: Secondary | ICD-10-CM | POA: Diagnosis not present

## 2018-05-27 DIAGNOSIS — I10 Essential (primary) hypertension: Secondary | ICD-10-CM | POA: Diagnosis not present

## 2018-06-01 ENCOUNTER — Ambulatory Visit (INDEPENDENT_AMBULATORY_CARE_PROVIDER_SITE_OTHER): Payer: Medicare Other | Admitting: Podiatry

## 2018-06-01 ENCOUNTER — Encounter: Payer: Self-pay | Admitting: Podiatry

## 2018-06-01 DIAGNOSIS — B351 Tinea unguium: Secondary | ICD-10-CM | POA: Diagnosis not present

## 2018-06-01 DIAGNOSIS — E1142 Type 2 diabetes mellitus with diabetic polyneuropathy: Secondary | ICD-10-CM | POA: Diagnosis not present

## 2018-06-01 DIAGNOSIS — M79676 Pain in unspecified toe(s): Secondary | ICD-10-CM | POA: Diagnosis not present

## 2018-06-01 NOTE — Progress Notes (Signed)
Patient ID: Lauren Avery, female   DOB: 08/02/1927, 84 y.o.   MRN: [id number] Complaint:  Visit Type: Patient returns to my office for continued preventative foot care services. Complaint: Patient states" my nails have grown long and thick and become painful to walk and wear shoes" Patient has been diagnosed with DM with no foot complications. The patient presents for preventative foot care services. No changes to ROS  Podiatric Exam: Vascular: dorsalis pedis and posterior tibial pulses are palpable bilateral. Capillary return is immediate. Temperature gradient is WNL. Skin turgor WNL  Sensorium: Diminished  Semmes Weinstein monofilament test. Normal tactile sensation bilaterally. Nail Exam: Pt has thick disfigured discolored nails with subungual debris noted bilateral entire nail hallux through fifth toenails Ulcer Exam: There is no evidence of ulcer or pre-ulcerative changes or infection. Orthopedic Exam: Muscle tone and strength are WNL. No limitations in general ROM. No crepitus or effusions noted. Foot type and digits show no abnormalities. HAV  B/L. Skin: No Porokeratosis. No infection or ulcers  Diagnosis:  Onychomycosis, , Pain in right toe, pain in left toes  Treatment & Plan Procedures and Treatment: Consent by patient was obtained for treatment procedures. The patient understood the discussion of treatment and procedures well. All questions were answered thoroughly reviewed. Debridement of mycotic and hypertrophic toenails, 1 through 5 bilateral and clearing of subungual debris. No ulceration, no infection noted.  Return Visit-Office Procedure: Patient instructed to return to the office for a follow up visit 10 weeks  for continued evaluation and treatment.    Jadie Comas DPM 

## 2018-06-02 ENCOUNTER — Telehealth: Payer: Self-pay | Admitting: Pulmonary Disease

## 2018-06-02 ENCOUNTER — Other Ambulatory Visit: Payer: Self-pay | Admitting: *Deleted

## 2018-06-02 MED ORDER — TRAMADOL HCL 50 MG PO TABS: ORAL_TABLET | ORAL | 2 refills | Status: DC

## 2018-06-02 NOTE — Telephone Encounter (Signed)
Received a PA for pt's tramadol. PA initiated via [email or web address]  Key: [id number]  After PA was submitted, the below information was stated:  Your information has been submitted to [facility] [place]. [employer] [place] will review the request and notify you of the determination decision directly, typically within 3 business days of your submission and once all necessary information is received.  You will also receive your request decision electronically. To check for an update later, open the request again from your dashboard.  If [employer] [place] has not responded within the specified timeframe or if you have any questions about your PA submission, contact [employer] [place] directly at [facility](MAPD) [phone] or (PDP) [phone]  Will route to Misty StanleyLisa to follow up on

## 2018-06-02 NOTE — Telephone Encounter (Signed)
Per SN- Ok for Tramadol refill.  Tramadol 50mg , take 1/2 to 1 tab TID, as needed for pain, #90, with 2 refills called into [facility], [address]  Patient notified and understanding stated. Nothing further at this time.

## 2018-06-02 NOTE — Telephone Encounter (Signed)
Pt is requesting Rx for Tramadol 50mg , for cough.  Pt states she is coughing more during the summer months. Cough is non prod.  Denied additional symptoms.  pref[facility] [address]  Rx last refilled 01/21/18 # 90- 1/2 to 1 tabs tid as needed with 5 refills.  SN please advise. Thanks   Current Outpatient Medications on File Prior to Visit  Medication Sig Dispense Refill  . acetaminophen (TYLENOL) 325 MG tablet Take 2 tablets (650 mg total) by mouth every 6 (six) hours as needed. (Patient taking differently: Take 650 mg by mouth every 6 (six) hours as needed for mild pain, fever or headache. )    . ADVAIR DISKUS 500-50 MCG/DOSE AEPB INHALE 1 PUFF INTO LUNGS TWICE DAILY 60 each 5  . amLODipine (NORVASC) 5 MG tablet Take 5 mg by mouth every other day.     . Cholecalciferol (VITAMIN D PO) Take by mouth daily. 1 tab everyday    . glimepiride (AMARYL) 2 MG tablet Take 4 mg by mouth daily before breakfast.  30 tablet 0  . losartan (COZAAR) 50 MG tablet Take 50 mg by mouth daily.    . Multiple Vitamins-Minerals (MULTIVITAMIN WITH MINERALS) tablet Take 1 tablet by mouth daily.    . pantoprazole (PROTONIX) 40 MG tablet Take 1 tablet (40 mg total) by mouth 2 (two) times daily. (Patient taking differently: Take 40 mg by mouth daily. ) 60 tablet 6  . traMADol (ULTRAM) 50 MG tablet Take 1/2-1 tablet three times daily as directed for cough 90 tablet 5  . [DISCONTINUED] Calcium Carbonate-Vitamin D (CALCIUM-VITAMIN D) 500-200 MG-UNIT per tablet Take 1 tablet by mouth 2 (two) times daily with a meal.     No current facility-administered medications on file prior to visit.     Allergies  Allergen Reactions  . Hydrochlorothiazide Other (See Comments)    Taken from faxed notes of dr Wylene Simmertisovec (to short stay)  THROMBOCYTOPENIA  . Prilosec [Omeprazole] Diarrhea

## 2018-06-04 MED ORDER — TRAMADOL HCL 50 MG PO TABS: ORAL_TABLET | ORAL | 2 refills | Status: DC

## 2018-06-04 NOTE — Telephone Encounter (Signed)
Per cover my meds- PA for Tramadol has been denied.   Dr. Kriste BasqueNadel please advise if you would like to appeal. Thanks

## 2018-06-04 NOTE — Telephone Encounter (Signed)
Tramadol 50mg  PA denied.  New prescription printed and signed by SN, Tramadol 50mg , take 1/2 to 1 tab by mouth, TID as needed, #90, with 2 refills.  Prescription placed in sealed envelope with [employer] coupons, placed at front desk for mail. Patient notified of PA denial and new prescription, with coupons being mailed to her.  Patient stated understanding.  Nothing further at this time.

## 2018-06-21 ENCOUNTER — Other Ambulatory Visit: Payer: Self-pay | Admitting: Pulmonary Disease

## 2018-06-30 ENCOUNTER — Other Ambulatory Visit: Payer: Self-pay | Admitting: Pulmonary Disease

## 2018-06-30 DIAGNOSIS — E1165 Type 2 diabetes mellitus with hyperglycemia: Secondary | ICD-10-CM | POA: Diagnosis not present

## 2018-06-30 DIAGNOSIS — N183 Chronic kidney disease, stage 3 (moderate): Secondary | ICD-10-CM | POA: Diagnosis not present

## 2018-06-30 DIAGNOSIS — E1142 Type 2 diabetes mellitus with diabetic polyneuropathy: Secondary | ICD-10-CM | POA: Diagnosis not present

## 2018-06-30 DIAGNOSIS — E1129 Type 2 diabetes mellitus with other diabetic kidney complication: Secondary | ICD-10-CM | POA: Diagnosis not present

## 2018-06-30 MED ORDER — FLUTICASONE-SALMETEROL 500-50 MCG/DOSE IN AEPB: 1.0000 | INHALATION_SPRAY | Freq: Two times a day (BID) | RESPIRATORY_TRACT | 5 refills | Status: DC

## 2018-06-30 NOTE — Telephone Encounter (Signed)
We received  A faxed  Request to change generic brand  [name] The Advair was okay to change to the generic brand .

## 2018-07-19 DIAGNOSIS — E1165 Type 2 diabetes mellitus with hyperglycemia: Secondary | ICD-10-CM | POA: Diagnosis not present

## 2018-07-21 ENCOUNTER — Encounter: Payer: Self-pay | Admitting: Pulmonary Disease

## 2018-07-21 ENCOUNTER — Ambulatory Visit: Payer: Medicare Other | Admitting: Pulmonary Disease

## 2018-07-21 VITALS — BP 118/58 | HR 91 | Temp 97.9°F | Ht 62.0 in | Wt 114.4 lb

## 2018-07-21 DIAGNOSIS — R05 Cough: Secondary | ICD-10-CM | POA: Diagnosis not present

## 2018-07-21 DIAGNOSIS — K219 Gastro-esophageal reflux disease without esophagitis: Secondary | ICD-10-CM | POA: Diagnosis not present

## 2018-07-21 DIAGNOSIS — R5381 Other malaise: Secondary | ICD-10-CM

## 2018-07-21 DIAGNOSIS — J84112 Idiopathic pulmonary fibrosis: Secondary | ICD-10-CM | POA: Diagnosis not present

## 2018-07-21 DIAGNOSIS — R053 Chronic cough: Secondary | ICD-10-CM

## 2018-07-21 NOTE — Progress Notes (Signed)
Subjective:     Patient ID: Lauren Avery, female   DOB: 06/24/1927, 84 y.o.   MRN: [id number]  HPI  ~  March 16, 2014:  6236yr ROV w/ KC>        The patient comes in today for followup of her known idiopathic pulmonary fibrosis and chronic cough that is felt secondary to upper and lower airway issues. The patient was last seen 2 years ago, and she never followed up with her chest x-ray and pulmonary function studies because of other medical issues that required hospitalizations. She comes in today where her cough continues to be a significant problem, and she feels that her exertional tolerance has declined as well. She is no longer taking her proton pump inhibitor, and is having ongoing reflux symptoms. She is also having a lot of rhinorrhea, and is not taking her antihistamine on a regular basis.      REC>  1) Chronic cough:  The pt continues to have dry cough that I suspect is both upper and lower airway in origin. She is having ongoing reflux and in is no longer taking her PPI, and also is having rhinorrhea. Chronic dry cough is also a hallmark of IPF. I have also stressed to her the importance of reflux treatment in patients with interstitial lung disease, and how it can actually cause progressive disease . It has become standard of care to treat all patients with IPF with a proton pump inhibitor empirically. Will also try cough suppression both during the day and night.  2) IPF:  The patient has idiopathic pulmonary fibrosis, and she has not had recent chest x-ray or PFTs because of other medical issues that needed to be taken care of. She does feel that her breathing is a little worse now than in the past, and I will schedule her for a followup chest x-ray and pulmonary function studies. I do not think she is a great candidate for anti-fibrotic therapy.  ~  March 27, 2014:  2wk ROV w/ KC>        The patient comes in today for followup after her recent chest x-ray and PFTs. She has known  idiopathic pulmonary fibrosis, and her chest x-ray showed progressive disease. Her PFTs compared to 2013 showed a decline in her FVC and T. LC, but her diffusion capacity was actually a little better. The patient continues to have her dry cough, but has recently started to have more of a wet cough that is productive of only white mucus. She does not feel chest congestion. She was having loose stools from her proton pump inhibitor, and this was discontinued. She now has breakthrough reflux symptoms for which she is taking times. She does feel that tramadol helps her dry cough.      REC>  The patient's most recent chest x-ray showed some disease progression, and although her PFTs do show a drop in her .FVC and TLC, her DLCO is actually slightly improved. Again, I do not think she is a very good candidate for the anti-fibrotic agents, especially since they are associated with severe diarrhea. I worry about her frailty going forward. At this point, I think we will continue to watch her, and work on symptom control. I think it is very important that she stays on a proton pump inhibitor, since reflux is extremely common in pulmonary fibrosis patients. Not only does it worsen cough, but it also can cause progressive disease. She did not do well on Prilosec, so we'll  try her on Protonix. I have also asked her to continue on something for postnasal drip As well. She tells me that tramadol did help her cough, and I have asked her to continue on this as needed. Of note, her ambulatory oximetry today on room air did not show desaturation of significance.  ~  September 20, 2015:  72mo ROV w/ SN>  Her PCP is DrTisovec...      84 y/o WF w/ pulm fibrosis, not on meds and treated only w/ Hycodan prn;  She recently noted incr cough, mostly dry but noted more after eating & strenuous activity; cough syrup helps and she notes that Tramadol helped in past; she denies f/c/s, no CP/ palpit/ edema; she does wake some at night coughing;  she denies SOB w/ ADLs etc; DrTisovec recently added ProairHFA prn use...       EXAM shows Afeb, VSS, O2sat=93% on RA;  HEENT- neg, mallampati1;  Chest- velcro rales ~1/2 way up posteriorly & ant as well;  Heart- RR gr1/6 SEM w/o r/g;  Abd- soft, neg;  Ext- neg w/o c/c/e...   CT Chest 02/16/12 at [facility] showed diffuse bilat interstitial fibrosis w/ subpleural honeycombing- progressively worse compared to 2007; 5mm right apical nodule, 4mm medial RLL nodule, small mediastinal nodes, norm heart size & mild calcif atherosclerotic changes, mult healed left rib fxs, mod mid-thoracic compression fx, mult gallstones noted...  PFT 03/16/12 showed FVC=2.02 (88%), FEV1=1.72 (114%), %1sec=85, mid-flows wnl at 108% predicted;  TLC=3.50 (81%), RV=1.48 (79%), RV/TLC=42;  DLCO=58% predicted => c/w mild restriction and mod decr in diffusion...  PFT 03/20/14 showed FVC=1.88 (88%), FEV1=1.59 (101%), %1sec=85, mid-flows wnl at 183% predicted; TLC=3.31 (69%), RV=1.47 (61%), RV/TLC=44;  DLCO=44% predicted => c/w mild to mod restrictive dis w/ severe decr in diffusion capacity...   CXR 03/2014 showed norm heart size, progressive ILD, old compression T8...  CXR 09/20/15 showed norm heart size, diffuse bilat fibrotic changes, thoracic compression T8 & T12...  Ambulatory oxygen saturation test 09/20/15 showed O2sat=99% on RA at rest w/ pulse=86/min; she ambulatedonly 1 lap & stopped w/ fatigue & SOB; lowest O2sat=94% w/ pulse=119/min...   ?she had recent ONO by DrTisovec => we will try to get copy sent to us to review... PLAN>>  Lauren Avery has severe pulm fibrosis which creates quite a dilemma in a fragile 84 y/o lady; I agree that she is not a good cand for antifibrotic therapy & also not a good cand for Pred rx given her osteoporosis & mult fxs; Rec to try ADVAIR500- 1 inhalation Bid + her ProairHFA as needed; she has Hycodan and Tramadol to use for the cough; deconditioning is a serious issue for her as she has been way  too sedentary over the yrs- rec to consider pulm rehb, silver sneakers, yoga, etc but she must be careful (no strenuous activ & no falling allowed!).Marland Kitchen..   ~  March 20, 2016:  48mo ROV w/ SN>  Lauren Avery has her own ideas about things and is pretty set in her ways at 84 y/o and she tells me on the one hand that her breathing is the same, then on the other hand she notes that she's not taking our meds!  Recall last visit we reviewed her IPF, GERD & LPR, plus her cough & deconditioning> we recommended trial of ADVAIR500- one inhalation Bid, PROAIR-HFA as needed, Hycodan cough syrup and Tramadol for the cough, and a vigorous antireflux regimen w/ Protonix/ NPO after dinner/ elev HOB... She pretty much hasn't done any  of this (much to her daughter's chagrin) so we spent some time reviewing the diagnoses and recommended treatments...    IPF>  Progressive ILD on serial CXR/ scans; he's only been using the Advair500 one puff daily; she is 84 y/o & frail w/ osteoporosis & mult fxs => we discussed trial ADVAIR500Bid...    GERD/ LPR>  She stopped the PPI & never followed the antireflux regimen;  We reviewed the need for Protonix40 taken 30min before dinner, NPO after dinner, elev HOB 6" and she will comply she says...    Cough>  She has Hycodan and Tramadol; explained how antireflux regimen will help as well...    Severe deconditioning>  She has remained sedentary & we discussed the implications for NHP if she doesn't keep moving! Discussed exercise program to improve her stamina...    MEDICAL issues>  HBP, DM, osteoporosis, compression fxs, etc => per DrTisovec... EXAM shows Afeb, VSS, O2sat=97% on RA;  HEENT- neg, mallampati1;  Chest- velcro rales ~1/2 way up posteriorly, no wheezing or rhonchi;  Heart- RR gr1/6 SEM w/o r/g;  Abd- soft, neg;  Ext- neg w/o c/c/e;  Neuro- intact...  IMP/PLAN>>  Lauren Avery is asked to get with the program & see if she responds- use the AdvairBid, ProventilHFA prn, follow the vigorous antireflux  regimen, use the cough suppressants as needed & start improving her exercise program...   ~  September 23, 2016:  574mo ROV w/ SN>  Her PCP is DrTisovec & she reports CPX 07/2016 w/ Labs "all ok" she says;  Followed for IPF, age 84, adeq oxygenation- not requiring O2, multifactorial dyspnea, chr cough;  She tends to use the Hycodan Qhs since "it helps me rest" and uses the Tramadol50- 1/2 tab prn days;  She ambulates w/ walker & NOT very active but encouraged to continue walking to build stamina & insure she will be able to continue doing her ADLs w/o assit-- but she is quite set in her ways & appears to NOT be taking her meds as prescribed (eg- only using Advair once daily...    EXAM shows Afeb, VSS, O2sat=97% on RA;  HEENT- neg, mallampati1;  Chest- velcro rales ~1/2 way up posteriorly, no wheezing or rhonchi;  Heart- RR gr1/6 SEM w/o r/g;  Abd- soft, neg;  Ext- neg w/o c/c/e;  Neuro- intact...   CXR 09/23/16 (independently reviewed by me in the PACS system) showed norm heart size, diffuse fibrotic changes bilat- stable, mild compression fx mid Tspine, & severe compreession fx in upper Lspine (both stable)...  IMP/PLAN>>  Lauren Avery is [age] & set in her ways- I have rec that she incr the Advair500 to Bid, take the PPI & follow the antireflux regimen, use the Hycodan & Tramadol prn cough; she needs incr exercise/ walking/ chair exercise/ etc...   ~  March 24, 2017:  574mo ROV w/ SN>  Lauren Avery is reasonably stable w/ her pulm fibrosis, mild cough (esp after eating & w/ exercise), and notes some back discomfort;  She has Advair500- supposed to be doing one inhalation Bid but only doing it once per day (her pref), and Tramadol50  Up to Bid as needed for pain & cough (told to take w/ Tylenol to boost it's effect)...  we reviewed the following medical problems during today's office visit >>     IPF>  Progressive ILD on serial CXR/ scans; he's only been using the Advair500 one puff daily; she is 84 y/o & frail w/ osteoporosis &  mult fxs => we  discussed taking the ADVAIR500Bid & use Tramadol + OTCs prn cough...    GERD/ LPR>  She stopped the PPI & never followed the antireflux regimen;  We reviewed the need for Protonix40 taken before dinner, NPO after dinner, elev HOB 6" but she never complied!    Cough>  She has Hycodan and Tramadol; explained how antireflux regimen will help as well...    Severe deconditioning>  She has remained sedentary & we discussed the implications for NHP if she doesn't keep moving! Discussed exercise program to improve her stamina...    MEDICAL issues>  HBP (onAmlod5, Losar50), DM (on Glimep2), osteoporosis (on Reclast yrly), compression fxs, etc => per DrTisovec... EXAM shows Afeb, VSS, O2sat=96% on RA;  HEENT- neg, mallampati1;  Chest- velcro rales ~1/2 way up posteriorly (no change), no wheezing or rhonchi;  Heart- RR gr1/6 SEM w/o r/g;  Abd- soft, neg;  Ext- neg w/o c/c/e...   Ambulatory Oximetery 03/24/17>  O2sat=98% on RA at rest w/ pulse=92/min;  She ambulated 2 laps in office (185'ea) w/ lowest O2sat=94% w/ pulse ~114/min...  ONO performed 04/10/17 on RA & showed O2sat zenith of 97%, nadir of 86%, Ave of 94%;  She spent only 12 sec <88% saturation & does not qualify for Home O2... IMP/PLAN>>  Lauren Avery is asked to incr the Advair500 to Bid; OK to use the Tramadol prn back discomfort or cough & can add Tylenol to potentiate it's effect;  She still needs to consider my rec for incr exercise, better conditioning!  We will maintain Q42mo rov & sooner if needed for breathing problems...  ~  September 23, 2017:  64mo ROV & Lauren Avery returns c/o back pain- thinks she pulled a muscle & nothing is helping, states she was seen by Ortho & urgent care, her PCP is DrTisovec & she hasn't approached him w/ this issue;  She has been using Tramadol/ Tylenol/ Baclofen, plus heating pad, etc and she notes that Aleve seems to help more!  We follow her for prob IPF, age 68 now, w/ chr stable DOE while walking ("I walk  the halls" & is limited by the back pain currently;  She denies much cough, min clear sput, no hemoptysis, denies f/c/s, no CP, etc...     IPF>  Progressive ILD on serial CXR/ scans; she's using the Advair500 Bid now; she is 84 y/o & frail w/ osteoporosis & mult fxs => we discussed taking the ADVAIR500Bid & use Tramadol + OTCs prn cough...    GERD/ LPR>  We reviewed the need for Protonix40 taken before dinner, NPO after dinner, elev HOB 6" but she never complied!    Cough>  She has Tramadol prn 7 off the prev Hycodan; explained how antireflux regimen will help as well...    Severe deconditioning>  She has remained sedentary & we discussed the implications for NHP if she doesn't keep moving! Discussed exercise program to improve her stamina...    MEDICAL issues>  HBP (onAmlod5, Losar50), DM (on Glimep2), osteoporosis (on Reclast yrly), compression fxs, etc => per PCP-DrTisovec... EXAM shows Afeb, VSS, O2sat=96% on RA;  HEENT- neg, mallampati1;  Chest- velcro rales ~1/2 way up posteriorly (no change), no wheezing or rhonchi;  Heart- RR gr1/6 SEM w/o r/g;  Abd- soft, neg;  Ext- neg w/o c/c/e...   CXR 09/23/17 (independently reviewed by me in the PACS system) shows norm heart size, aortic atherosclerosis, stable pulm fibrotic changes, wedge compression fxs in T9, L1, & L2...  IMP/PLAN>>  Lauren Avery  need her osteoporotic compression fxs attended by her PCP/ Ortho/ etc;  From the pulm perspective she is stable on the Advair500Bid, antireflux regimen, Tramadol...    ~  January 21, 2018:  80mo ROV & pulmonary follow up visit>  When last seen Lauren Avery was c/o back pain w/ compression fxs + spinal stenosis & lumbar radiculopathy on Tramadol/ Tylenol/ Baclofen from Ortho (DrXu & DrNewton) & PCP (DrTisovec), but she notes aleve helps more!  She has ESI injection 10/2017;  She received outpt PT as well & their notes are reviewed;  She is also treated w/ Reclast IV yearly;  She reports that she was treated w/ Levaquin,  Pred pack, & cough syrup recently & improved;  We continue to follow her for prob IPF at age 75, w/ chr stable DOE while walking; she denies much cough, min clear sput, no hemoptysis, denies f/c/s, no CP, etc;  She remains on Advair500-Bid + Protonix40Bid & antireflux regimen...     Prob list reviewed w/ pt & daughter...  EXAM shows Afeb, VSS, O2sat=96% on RA;  HEENT- neg, mallampati1;  Chest- velcro rales ~1/2 way up posteriorly (no change), no wheezing or rhonchi;  Heart- RR gr1/6 SEM w/o r/g;  Abd- soft, neg;  Ext- neg w/o c/c/e...   ALL LABS DONE BY PCP & we do not have copies... IMP/PLAN>>  Family indicated that DrTisovec would not refill her Tramadol (helps her pain & cough) so we took care of this for her- #90- 1/2 to 1 tab Tid as needed... Continue Advair500, stay as active as poss, walking, etc...     Past Medical History  Diagnosis  Date  . Osteoporosis >> on RECLAST per DrTisovec   . Pulmonary fibrosis (HCC) >> on Hycodan, Tramadol, ProairHFA prn...   . GERD (gastroesophageal reflux disease) >> prev on Protonix40 but she stopped on her own & never embraced the antireflux regimen   . Hypertension >> on Amlod5 & Cozaar50   . Diabetes mellitus >> on Glimep2mg    . Fracture >> off Norco7,5 & on Tramadol50     Left clavicle 12/12 rx w/ sling     left wrist 12/13    Left elbow w/ surg 05/2015 by Domingo Dimes    Past Surgical History:  Procedure Laterality Date  . FEMUR FRACTURE SURGERY      6/08 partial hip replacement right  . ganglion cyst removal     left wrist 1960  . HIP FRACTURE SURGERY     1984 pin and plate placed right  . HIP SURGERY     pin and plate removed 12/6107  . ORIF ELBOW FRACTURE Right 02/18/2013   Procedure: OPEN REDUCTION INTERNAL FIXATION (ORIF) ELBOW/OLECRANON FRACTURE;  Surgeon: Nadara Mustard, MD;  Location: [facility] OR;  Service: Orthopedics;  Laterality: Right;  . ORIF ELBOW FRACTURE Left 05/09/2015   Procedure: OPEN REDUCTION INTERNAL FIXATION (ORIF) LEFT MEDIAL HUMERAL  CONDYLE FRACTURE;  Surgeon: Tarry Kos, MD;  Location: [facility] OR;  Service: Orthopedics;  Laterality: Left;    Outpatient Encounter Medications as of 01/21/2018  Medication Sig  . acetaminophen (TYLENOL) 325 MG tablet Take 2 tablets (650 mg total) by mouth every 6 (six) hours as needed. (Patient taking differently: Take 650 mg by mouth every 6 (six) hours as needed for mild pain, fever or headache. )  . amLODipine (NORVASC) 5 MG tablet Take 5 mg by mouth every other day.   . Cholecalciferol (VITAMIN D PO) Take by mouth daily. 1 tab everyday  . glimepiride (  AMARYL) 2 MG tablet Take 4 mg by mouth daily before breakfast.   . losartan (COZAAR) 50 MG tablet Take 50 mg by mouth daily.  . Multiple Vitamins-Minerals (MULTIVITAMIN WITH MINERALS) tablet Take 1 tablet by mouth daily.  . pantoprazole (PROTONIX) 40 MG tablet Take 1 tablet (40 mg total) by mouth 2 (two) times daily. (Patient taking differently: Take 40 mg by mouth daily. )  . [DISCONTINUED] ADVAIR DISKUS 500-50 MCG/DOSE AEPB INHALE 1 PUFF INTO LUNGS TWICE DAILY  . [DISCONTINUED] baclofen (LIORESAL) 10 MG tablet Take 1 tablet (10 mg total) by mouth 3 (three) times daily as needed for muscle spasms.  . [DISCONTINUED] predniSONE (STERAPRED UNI-PAK 21 TAB) 10 MG (21) TBPK tablet Take as directed  . [DISCONTINUED] Zoledronic Acid (RECLAST IV) Inject into the vein. yearly  . [DISCONTINUED] Calcium Carbonate-Vitamin D (CALCIUM-VITAMIN D) 500-200 MG-UNIT per tablet Take 1 tablet by mouth 2 (two) times daily with a meal.  . [DISCONTINUED] traMADol (ULTRAM) 50 MG tablet Take 1/2-1 tablet three times daily as directed for cough (Patient not taking: Reported on 01/21/2018)  . [DISCONTINUED] traMADol (ULTRAM) 50 MG tablet Take 1/2-1 tablet three times daily as directed for cough   No facility-administered encounter medications on file as of 01/21/2018.     Allergies  Allergen Reactions  . Hydrochlorothiazide Other (See Comments)    Taken from faxed notes  of dr Wylene Simmer (to short stay)  THROMBOCYTOPENIA  . Prilosec [Omeprazole] Diarrhea    Immunization History  Administered Date(s) Administered  . Influenza Split 09/01/2011, 09/15/2012  . Influenza, High Dose Seasonal PF 09/10/2017  . Influenza,inj,Quad PF,6+ Mos 08/31/2013, 07/31/2016  . Influenza-Unspecified 09/06/2015  . PPD Test 05/11/2015, 05/14/2015  . Pneumococcal Conjugate-13 07/20/2014  . Pneumococcal Polysaccharide-23 11/11/2007    Current Medications, Allergies, Past Medical History, Past Surgical History, Family History, and Social History were reviewed in [employer] record.   Review of Systems             All symptoms NEG except where BOLDED >>  Constitutional:  F/C/S, fatigue, anorexia, unexpected weight change. HEENT:  HA, visual changes, hearing loss, earache, nasal symptoms, sore throat, mouth sores, hoarseness. Resp:  cough, sputum, hemoptysis; SOB, tightness, wheezing. Cardio:  CP, palpit, DOE, orthopnea, edema. GI:  N/V/D/C, blood in stool; reflux, abd pain, distention, gas. GU:  dysuria, freq, urgency, hematuria, flank pain, voiding difficulty. MS:  joint pain, swelling, tenderness, decr ROM; neck pain, back pain, etc. Neuro:  HA, tremors, seizures, dizziness, syncope, weakness, numbness, gait abn. Skin:  suspicious lesions or skin rash. Heme:  adenopathy, bruising, bleeding. Psyche:  confusion, agitation, sleep disturbance, hallucinations, anxiety, depression suicidal.   Objective:   Physical Exam       Vital Signs:  Reviewed...   General:  WD, petite, 84 y/o WF in NAD; alert & oriented; pleasant & cooperative... HEENT:  Ida/AT; Conjunctiva- pink, Sclera- nonicteric, EOM-wnl, PERRLA, EACs-clear, TMs-wnl; NOSE-clear; THROAT-clear & wnl.  Neck:  Supple w/ fair ROM; no JVD; normal carotid impulses w/o bruits; no thyromegaly or nodules palpated; no lymphadenopathy.  Chest:  Bibasilar velcro rales ~1/3rd to 1/2 the way up the back, no  wheezing/ rhonchi/ consolidation... Heart:  Regular Rhythm; norm S1 & S2 without murmurs, rubs, or gallops detected. Abdomen:  Soft & nontender- no guarding or rebound; normal bowel sounds; no organomegaly or masses palpated. Ext:  Sl decr ROM; without deformities +arthritic changes; no varicose veins, venous insuffic, or edema;  Pulses intact w/o bruits. Neuro:  CNs II-XII intact;  motor testing normal; sensory testing normal; gait normal & balance OK. Derm:  No lesions noted; no rash etc. Lymph:  No cervical, supraclavicular, axillary, or inguinal adenopathy palpated.   Assessment:      IMP >>     Diffuse pulmonary fibrosis in an 84 y/o lady> Given her age & co-morbidities I agree that she is not a cand for antifibrotic therapy; neither is she a good cand for Pred trial given her severe osteoporosis & mult fxs; therefore we will start ADVAIR500- 1 inhalation Bid and continue the ProairHFA rescue inhaler prn along w/ the Hycodan and Tramadol prn cough...    Deconditioned and poor exercise tolerance> We discussed this issue & the need for a gradual progressive exercise program- eg PulmRehab vs silver sneakers, [employer], Yoga, etc...    GERD and prob LPR> She needs a vigorous antireflux regimen w/ Protonix40 taken before dinner, NPO after dinner, elev HOB 6" blocks, etc...    HBP> on Amlod & Losar per DrTisovec...    DM> on Glimep per DrTisovec..    Osteoporosis & thoracic compression fxs, mult other fxs (hip, femur, clavicle, wrist, elbow)> on RECLAST + calcium/ MVI/ VitD, Norco, Tramadol...  PLAN >>  09/20/15>   Ashle has severe pulm fibrosis which creates quite a dilemma in a fragile 84 y/o lady; I agree that she is not a good cand for antifibrotic therapy & also not a good cand for Pred rx given her osteoporosis & mult fxs; Rec to try ADVAIR500- 1 inhalation Bid + her ProairHFA as needed; she has Hycodan and Tramadol to use for the cough; deconditioning is a serious issue for her as she  has been way too sedentary over the yrs- rec to consider pulm rehb, silver sneakers, yoga, etc but she must be careful (no strenuous activ & no falling allowed!) 03/20/16>   Lauren Avery is asked to get with the program & see if she responds- use the AdvairBid, ProventilHFA prn, follow the vigorous antireflux regimen, use the cough suppressants as needed & start improving her exercise program...  09/23/16>   Lauren Avery is 89 & set in her ways- I have rec that she incr the Advair500 to Bid, take the PPI & follow the antireflux regimen, use the Hycodan & Tramadol prn cough; she needs incr exercise/ walking/ chair exercise/ etc 03/24/17>   Lauren Avery is asked to incr the Advair500 to Bid; OK to use the Tramadol prn back discomfort or cough & can add Tylenol to potentiate it's effect;  She still needs to consider my rec for incr exercise, better conditioning!  We will maintain Q76mo rov & sooner if needed for breathing problems 09/23/17>   Lauren Avery need her osteoporotic compression fxs attended by her PCP/ Ortho/ etc;  From the pulm perspective she is stable on the Advair500Bid, antireflux regimen, Tramadol...  01/21/18>   Family indicated that DrTisovec would not refill her Tramadol (helps her pain & cough) so we took care of this for her- #90- 1/2 to 1 tab Tid as needed... Continue Advair500, stay as active as poss, walking, etc...   Plan:     Patient's Medications  New Prescriptions   FLUTICASONE-SALMETEROL (WIXELA INHUB) 500-50 MCG/DOSE AEPB    Inhale 1 puff into the lungs 2 (two) times daily.  Previous Medications   ACETAMINOPHEN (TYLENOL) 325 MG TABLET    Take 2 tablets (650 mg total) by mouth every 6 (six) hours as needed.   AMLODIPINE (NORVASC) 5 MG TABLET    Take 5 mg by  mouth every other day.    CHOLECALCIFEROL (VITAMIN D PO)    Take by mouth daily. 1 tab everyday   GLIMEPIRIDE (AMARYL) 2 MG TABLET    Take 4 mg by mouth daily before breakfast.    LOSARTAN (COZAAR) 50 MG TABLET    Take 50 mg by mouth daily.   MULTIPLE  VITAMINS-MINERALS (MULTIVITAMIN WITH MINERALS) TABLET    Take 1 tablet by mouth daily.   PANTOPRAZOLE (PROTONIX) 40 MG TABLET    Take 1 tablet (40 mg total) by mouth 2 (two) times daily.  Modified Medications   Modified Medication Previous Medication   TRAMADOL (ULTRAM) 50 MG TABLET traMADol (ULTRAM) 50 MG tablet      Take 1/2-1 tablet three times daily as needed    Take 1/2-1 tablet three times daily as directed for cough  Discontinued Medications   ADVAIR DISKUS 500-50 MCG/DOSE AEPB    INHALE 1 PUFF INTO LUNGS TWICE DAILY   BACLOFEN (LIORESAL) 10 MG TABLET    Take 1 tablet (10 mg total) by mouth 3 (three) times daily as needed for muscle spasms.   PREDNISONE (STERAPRED UNI-PAK 21 TAB) 10 MG (21) TBPK TABLET    Take as directed   TRAMADOL (ULTRAM) 50 MG TABLET    Take 1/2-1 tablet three times daily as directed for cough   ZOLEDRONIC ACID (RECLAST IV)    Inject into the vein. yearly

## 2018-07-21 NOTE — Patient Instructions (Signed)
Today we updated your med list in our EPIC system...    Continue your current medications the same...  Continue the [id number]- one inhalation twice daily...  Continue the TRAMADOL 50mg  - 1/2 to 1 tab up to 3 times daily as needed for pain or cough...  Today we checked an ambulatory oximetry test - your oxygen level remained good throughout!  Stay as active as possible...  Avoid infections as we discussed...  Call for any questions or if we can be of service in any way...    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  We will set up a return appt in about 6 months w/ one of my young partners.Marland Kitchen..Marland Kitchen

## 2018-07-23 ENCOUNTER — Encounter: Payer: Self-pay | Admitting: Pulmonary Disease

## 2018-07-23 NOTE — Progress Notes (Addendum)
Subjective:     Patient ID: Lauren Avery, female   DOB: 06/24/1927, 84 y.o.   MRN: [id number]  HPI  ~  March 16, 2014:  6236yr ROV w/ KC>        The patient comes in today for followup of her known idiopathic pulmonary fibrosis and chronic cough that is felt secondary to upper and lower airway issues. The patient was last seen 2 years ago, and she never followed up with her chest x-ray and pulmonary function studies because of other medical issues that required hospitalizations. She comes in today where her cough continues to be a significant problem, and she feels that her exertional tolerance has declined as well. She is no longer taking her proton pump inhibitor, and is having ongoing reflux symptoms. She is also having a lot of rhinorrhea, and is not taking her antihistamine on a regular basis.      REC>  1) Chronic cough:  The pt continues to have dry cough that I suspect is both upper and lower airway in origin. She is having ongoing reflux and in is no longer taking her PPI, and also is having rhinorrhea. Chronic dry cough is also a hallmark of IPF. I have also stressed to her the importance of reflux treatment in patients with interstitial lung disease, and how it can actually cause progressive disease . It has become standard of care to treat all patients with IPF with a proton pump inhibitor empirically. Will also try cough suppression both during the day and night.  2) IPF:  The patient has idiopathic pulmonary fibrosis, and she has not had recent chest x-ray or PFTs because of other medical issues that needed to be taken care of. She does feel that her breathing is a little worse now than in the past, and I will schedule her for a followup chest x-ray and pulmonary function studies. I do not think she is a great candidate for anti-fibrotic therapy.  ~  March 27, 2014:  2wk ROV w/ KC>        The patient comes in today for followup after her recent chest x-ray and PFTs. She has known  idiopathic pulmonary fibrosis, and her chest x-ray showed progressive disease. Her PFTs compared to 2013 showed a decline in her FVC and T. LC, but her diffusion capacity was actually a little better. The patient continues to have her dry cough, but has recently started to have more of a wet cough that is productive of only white mucus. She does not feel chest congestion. She was having loose stools from her proton pump inhibitor, and this was discontinued. She now has breakthrough reflux symptoms for which she is taking times. She does feel that tramadol helps her dry cough.      REC>  The patient's most recent chest x-ray showed some disease progression, and although her PFTs do show a drop in her .FVC and TLC, her DLCO is actually slightly improved. Again, I do not think she is a very good candidate for the anti-fibrotic agents, especially since they are associated with severe diarrhea. I worry about her frailty going forward. At this point, I think we will continue to watch her, and work on symptom control. I think it is very important that she stays on a proton pump inhibitor, since reflux is extremely common in pulmonary fibrosis patients. Not only does it worsen cough, but it also can cause progressive disease. She did not do well on Prilosec, so we'll  try her on Protonix. I have also asked her to continue on something for postnasal drip As well. She tells me that tramadol did help her cough, and I have asked her to continue on this as needed. Of note, her ambulatory oximetry today on room air did not show desaturation of significance.   ~  September 20, 2015:  71mo ROV w/ SN>  Her PCP is DrTisovec...      84 y/o Lauren Avery w/ pulm fibrosis, not on meds and treated only w/ Hycodan prn;  She recently noted incr cough, mostly dry but noted more after eating & strenuous activity; cough syrup helps and she notes that Tramadol helped in past; she denies f/c/s, no CP/ palpit/ edema; she does wake some at night  coughing; she denies SOB w/ ADLs etc; DrTisovec recently added ProairHFA prn use...       EXAM shows Afeb, VSS, O2sat=93% on RA;  HEENT- neg, mallampati1;  Chest- velcro rales ~1/2 way up posteriorly & ant as well;  Heart- RR gr1/6 SEM w/o r/g;  Abd- soft, neg;  Ext- neg w/o c/c/e...   CT Chest 02/16/12 at [facility] showed diffuse bilat interstitial fibrosis w/ subpleural honeycombing- progressively worse compared to 2007; 5mm right apical nodule, 4mm medial RLL nodule, small mediastinal nodes, norm heart size & mild calcif atherosclerotic changes, mult healed left rib fxs, mod mid-thoracic compression fx, mult gallstones noted...  PFT 03/16/12 showed FVC=2.02 (88%), FEV1=1.72 (114%), %1sec=85, mid-flows wnl at 108% predicted;  TLC=3.50 (81%), RV=1.48 (79%), RV/TLC=42;  DLCO=58% predicted => c/w mild restriction and mod decr in diffusion...  PFT 03/20/14 showed FVC=1.88 (88%), FEV1=1.59 (101%), %1sec=85, mid-flows wnl at 183% predicted; TLC=3.31 (69%), RV=1.47 (61%), RV/TLC=44;  DLCO=44% predicted => c/w mild to mod restrictive dis w/ severe decr in diffusion capacity...   CXR 03/2014 showed norm heart size, progressive ILD, old compression T8...  CXR 09/20/15 showed norm heart size, diffuse bilat fibrotic changes, thoracic compression T8 & T12...  Ambulatory oxygen saturation test 09/20/15 showed O2sat=99% on RA at rest w/ pulse=86/min; she ambulatedonly 1 lap & stopped w/ fatigue & SOB; lowest O2sat=94% w/ pulse=119/min...   ?she had recent ONO by DrTisovec => we will try to get copy sent to [place] to review... PLAN>>  Lauren Avery has severe pulm fibrosis which creates quite a dilemma in a fragile 84 y/o lady; I agree that she is not a good cand for antifibrotic therapy & also not a good cand for Pred rx given her osteoporosis & mult fxs; Rec to try ADVAIR500- 1 inhalation Bid + her ProairHFA as needed; she has Hycodan and Tramadol to use for the cough; deconditioning is a serious issue for her as she has  been way too sedentary over the yrs- rec to consider pulm rehb, silver sneakers, yoga, etc but she must be careful (no strenuous activ & no falling allowed!).Marland Kitchen.   ~  March 20, 2016:  45mo ROV w/ SN>  Lauren Avery has her own ideas about things and is pretty set in her ways at 84 y/o and she tells me on the one hand that her breathing is the same, then on the other hand she notes that she's not taking our meds!  Recall last visit we reviewed her IPF, GERD & LPR, plus her cough & deconditioning> we recommended trial of ADVAIR500- one inhalation Bid, PROAIR-HFA as needed, Hycodan cough syrup and Tramadol for the cough, and a vigorous antireflux regimen w/ Protonix/ NPO after dinner/ elev HOB... She pretty much hasn't done  any of this (much to her daughter's chagrin) so we spent some time reviewing the diagnoses and recommended treatments...    IPF>  Progressive ILD on serial CXR/ scans; he's only been using the Advair500 one puff daily; she is 84 y/o & frail w/ osteoporosis & mult fxs => we discussed trial ADVAIR500Bid...    GERD/ LPR>  She stopped the PPI & never followed the antireflux regimen;  We reviewed the need for Protonix40 taken before dinner, NPO after dinner, elev HOB 6" and she will comply she says...    Cough>  She has Hycodan and Tramadol; explained how antireflux regimen will help as well...    Severe deconditioning>  She has remained sedentary & we discussed the implications for NHP if she doesn't keep moving! Discussed exercise program to improve her stamina...    MEDICAL issues>  HBP, DM, osteoporosis, compression fxs, etc => per DrTisovec... EXAM shows Afeb, VSS, O2sat=97% on RA;  HEENT- neg, mallampati1;  Chest- velcro rales ~1/2 way up posteriorly, no wheezing or rhonchi;  Heart- RR gr1/6 SEM w/o r/g;  Abd- soft, neg;  Ext- neg w/o c/c/e;  Neuro- intact...  IMP/PLAN>>  Lauren Avery is asked to get with the program & see if she responds- use the AdvairBid, ProventilHFA prn, follow the vigorous  antireflux regimen, use the cough suppressants as needed & start improving her exercise program...   ~  September 23, 2016:  20mo ROV w/ SN>  Her PCP is DrTisovec & she reports CPX 07/2016 w/ Labs "all ok" she says;  Followed for IPF, age 42, adeq oxygenation- not requiring O2, multifactorial dyspnea, chr cough;  She tends to use the Hycodan Qhs since "it helps me rest" and uses the Tramadol50- 1/2 tab prn days;  She ambulates w/ walker & NOT very active but encouraged to continue walking to build stamina & insure she will be able to continue doing her ADLs w/o assit-- but she is quite set in her ways & appears to NOT be taking her meds as prescribed (eg- only using Advair once daily...    EXAM shows Afeb, VSS, O2sat=97% on RA;  HEENT- neg, mallampati1;  Chest- velcro rales ~1/2 way up posteriorly, no wheezing or rhonchi;  Heart- RR gr1/6 SEM w/o r/g;  Abd- soft, neg;  Ext- neg w/o c/c/e;  Neuro- intact...   CXR 09/23/16 (independently reviewed by me in the PACS system) showed norm heart size, diffuse fibrotic changes bilat- stable, mild compression fx mid Tspine, & severe compreession fx in upper Lspine (both stable)...  IMP/PLAN>>  Lauren Avery is 25 & set in her ways- I have rec that she incr the Advair500 to Bid, take the PPI & follow the antireflux regimen, use the Hycodan & Tramadol prn cough; she needs incr exercise/ walking/ chair exercise/ etc...   ~  March 24, 2017:  20mo ROV w/ SN>  Lauren Avery is reasonably stable w/ her pulm fibrosis, mild cough (esp after eating & w/ exercise), and notes some back discomfort;  She has Advair500- supposed to be doing one inhalation Bid but only doing it once per day (her pref), and Tramadol50  Up to Bid as needed for pain & cough (told to take w/ Tylenol to boost it's effect)...  we reviewed the following medical problems during today's office visit >>     IPF>  Progressive ILD on serial CXR/ scans; he's only been using the Advair500 one puff daily; she is 84 y/o & frail w/  osteoporosis & mult fxs =>  we discussed taking the ADVAIR500Bid & use Tramadol + OTCs prn cough...    GERD/ LPR>  She stopped the PPI & never followed the antireflux regimen;  We reviewed the need for Protonix40 taken 30min before dinner, NPO after dinner, elev HOB 6" but she never complied!    Cough>  She has Hycodan and Tramadol; explained how antireflux regimen will help as well...    Severe deconditioning>  She has remained sedentary & we discussed the implications for NHP if she doesn't keep moving! Discussed exercise program to improve her stamina...    MEDICAL issues>  HBP (onAmlod5, Losar50), DM (on Glimep2), osteoporosis (on Reclast yrly), compression fxs, etc => per DrTisovec... EXAM shows Afeb, VSS, O2sat=96% on RA;  HEENT- neg, mallampati1;  Chest- velcro rales ~1/2 way up posteriorly (no change), no wheezing or rhonchi;  Heart- RR gr1/6 SEM w/o r/g;  Abd- soft, neg;  Ext- neg w/o c/c/e...   Ambulatory Oximetery 03/24/17>  O2sat=98% on RA at rest w/ pulse=92/min;  She ambulated 2 laps in office (185'ea) w/ lowest O2sat=94% w/ pulse ~114/min...  ONO performed 04/10/17 on RA & showed O2sat zenith of 97%, nadir of 86%, Ave of 94%;  She spent only 1min 12 sec <88% saturation & does not qualify for Home O2... IMP/PLAN>>  Lauren Avery is asked to incr the Advair500 to Bid; OK to use the Tramadol prn back discomfort or cough & can add Tylenol to potentiate it's effect;  She still needs to consider my rec for incr exercise, better conditioning!  We will maintain Q5093mo rov & sooner if needed for breathing problems...  ~  September 23, 2017:  93mo ROV & Lauren Avery returns c/o back pain- thinks she pulled a muscle & nothing is helping, states she was seen by Ortho & urgent care, her PCP is DrTisovec & she hasn't approached him w/ this issue;  She has been using Tramadol/ Tylenol/ Baclofen, plus heating pad, etc and she notes that Aleve seems to help more!  We follow her for prob IPF, age 84 now, w/ chr stable DOE while  walking ("I walk the halls" & is limited by the back pain currently;  She denies much cough, min clear sput, no hemoptysis, denies f/c/s, no CP, etc...     IPF>  Progressive ILD on serial CXR/ scans; she's using the Advair500 Bid now; she is 84 y/o & frail w/ osteoporosis & mult fxs => we discussed taking the ADVAIR500Bid & use Tramadol + OTCs prn cough...    GERD/ LPR>  We reviewed the need for Protonix40 taken 30min before dinner, NPO after dinner, elev HOB 6" but she never complied!    Cough>  She has Tramadol prn 7 off the prev Hycodan; explained how antireflux regimen will help as well...    Severe deconditioning>  She has remained sedentary & we discussed the implications for NHP if she doesn't keep moving! Discussed exercise program to improve her stamina...    MEDICAL issues>  HBP (onAmlod5, Losar50), DM (on Glimep2), osteoporosis (on Reclast yrly), compression fxs, etc => per PCP-DrTisovec... EXAM shows Afeb, VSS, O2sat=96% on RA;  HEENT- neg, mallampati1;  Chest- velcro rales ~1/2 way up posteriorly (no change), no wheezing or rhonchi;  Heart- RR gr1/6 SEM w/o r/g;  Abd- soft, neg;  Ext- neg w/o c/c/e...   CXR 09/23/17 (independently reviewed by me in the PACS system) shows norm heart size, aortic atherosclerosis, stable pulm fibrotic changes, wedge compression fxs in T9, L1, & L2...  IMP/PLAN>>  Lauren Avery need her osteoporotic compression fxs attended by her PCP/ Ortho/ etc;  From the pulm perspective she is stable on the Advair500Bid, antireflux regimen, Tramadol...   ~  January 21, 2018:  9mo ROV & pulmonary follow up visit>  When last seen Lauren Avery was c/o back pain w/ compression fxs + spinal stenosis & lumbar radiculopathy on Tramadol/ Tylenol/ Baclofen from Ortho (DrXu & DrNewton) & PCP (DrTisovec), but she notes aleve helps more!  She has ESI injection 10/2017;  She received outpt PT as well & their notes are reviewed;  She is also treated w/ Reclast IV yearly;  She reports that she was treated  w/ Levaquin, Pred pack, & cough syrup recently & improved;  We continue to follow her for prob IPF at age 89, w/ chr stable DOE while walking; she denies much cough, min clear sput, no hemoptysis, denies f/c/s, no CP, etc;  She remains on Advair500-Bid + Protonix40Bid & antireflux regimen...     Prob list reviewed w/ pt & daughter...  EXAM shows Afeb, VSS, O2sat=96% on RA;  HEENT- neg, mallampati1;  Chest- velcro rales ~1/2 way up posteriorly (no change), no wheezing or rhonchi;  Heart- RR gr1/6 SEM w/o r/g;  Abd- soft, neg;  Ext- neg w/o c/c/e...   ALL LABS DONE BY PCP & we do not have copies... IMP/PLAN>>  Family indicated that DrTisovec would not refill her Tramadol (helps her pain & cough) so we took care of this for her- #90- 1/2 to 1 tab Tid as needed... Continue Advair500, stay as active as poss, walking, etc...    ~  July 21, 2018:  2mo ROV & Lauren Avery reports stable- sl dry cough (Tramadol helps & we have been refilling this for her), sm amt light mucus recently, chr stable SOB/DOE, no hemoptysis; denies CP/ palp[it/ edema... she continues to f/u w/ PCP- DrTisovec (we do not have notes from him), and Ortho- DrXu (eval for right shoulder discomfort, ?due to overuse, given shot w/ some relief)... We reviewed the following medical problems during today's office visit>      She is 84 years old...    IPF>  Initial eval by DrClance- CXR / CT Chest 2008 w/ ILD and CT pattern c/w IPF; spirometry surprisingly well preserved but w/ restricted LVs and decr DLCO; oxygen saturations have remained ok; she was felt to be too frail for antifibrotic medications and followed expectantly w/ watchful waiting protocol;  Over the last few years her CXR has remained essentially stable and her ambulatory O2 sats have remained good;  She is 84 y/o and frail w/ severe deconditioning, poor exercise capacity, and mult compression fxs, etc; to date the pulm fibrosis has not been her rate limiting problem; she's using the  Advair500 just once daily now=> we discussed taking the ADVAIR500Bid & use Tramadol + OTCs prn cough...    Cough>  She has Tramadol prn & off the prev Hycodan; explained how antireflux regimen will help as well...    GERD/ LPR>  We reviewed the need for Protonix40 taken before dinner, NPO after dinner, elev HOB 6" but she never complied w/ this regimen...    Severe deconditioning>  She has remained sedentary & we discussed the implications for NHP if she doesn't keep moving! Discussed exercise program to improve her stamina...    MEDICAL issues>  HBP (onAmlod5, Losar50), DM (on Tresiba insulin now), osteoporosis (on Reclast yrly), compression fxs, etc => per PCP-DrTisovec... EXAM shows Afeb, VSS, O2sat=96% on RA;  HEENT- neg, mallampati1;  Chest- velcro rales ~1/2 way up posteriorly (no change), no wheezing or rhonchi;  Heart- RR gr1/6 SEM w/o r/g;  Abd- soft, neg;  Ext- neg w/o c/c/e...   AMBULATORY OXIMETRY>  she walked 3 laps in the office on RA (185'each);  O2sat=98% on RA at rest w/ pulse=79/min;  She ambulated 3 laps w/ lowest O2sat=92% w/ HR=107/min... IMP/PLAN>>  Lauren Avery has mult medical issues at 84 y/o. We follow her for pulm fibrosis- which appears to have been relatively stable over the last few yrs, not felt to be a candidate for antifibrotic therapy; she has in addition been able to avoid any resp exacerbations;  REC to continue the Advair Bid, Tramadol prn, antireflux regimen, and incr her exercise program as able... we discussed my pending retirement in Jan2020 & we will have her follow up w/ DrIcard in about 6months...     Past Medical History  Diagnosis  Date  . Osteoporosis >> on RECLAST per DrTisovec   . Pulmonary fibrosis (HCC) >> on Hycodan, Tramadol, ProairHFA prn...   . GERD (gastroesophageal reflux disease) >> prev on Protonix40 but she stopped on her own & never embraced the antireflux regimen   . Hypertension >> on Amlod5 & Cozaar50   . Diabetes mellitus >> on  Glimep2mg    . Fracture >> off Norco7,5 & on Tramadol50     Left clavicle 12/12 rx w/ sling     left wrist 12/13    Left elbow w/ surg 05/2015 by Domingo Dimes    Past Surgical History:  Procedure Laterality Date  . FEMUR FRACTURE SURGERY      6/08 partial hip replacement right  . ganglion cyst removal     left wrist 1960  . HIP FRACTURE SURGERY     1984 pin and plate placed right  . HIP SURGERY     pin and plate removed 05/8294  . ORIF ELBOW FRACTURE Right 02/18/2013   Procedure: OPEN REDUCTION INTERNAL FIXATION (ORIF) ELBOW/OLECRANON FRACTURE;  Surgeon: Nadara Mustard, MD;  Location: [facility] OR;  Service: Orthopedics;  Laterality: Right;  . ORIF ELBOW FRACTURE Left 05/09/2015   Procedure: OPEN REDUCTION INTERNAL FIXATION (ORIF) LEFT MEDIAL HUMERAL CONDYLE FRACTURE;  Surgeon: Tarry Kos, MD;  Location: [facility] OR;  Service: Orthopedics;  Laterality: Left;    Outpatient Encounter Medications as of 07/21/2018  Medication Sig  . acetaminophen (TYLENOL) 325 MG tablet Take 2 tablets (650 mg total) by mouth every 6 (six) hours as needed. (Patient taking differently: Take 650 mg by mouth every 6 (six) hours as needed for mild pain, fever or headache. )  . amLODipine (NORVASC) 5 MG tablet Take 5 mg by mouth every other day.   . Cholecalciferol (VITAMIN D PO) Take by mouth daily. 1 tab everyday  . Fluticasone-Salmeterol (WIXELA INHUB) 500-50 MCG/DOSE AEPB Inhale 1 puff into the lungs 2 (two) times daily.  . Insulin Degludec (TRESIBA ) Inject into the skin. 8  Units 1 injection daily  . losartan (COZAAR) 50 MG tablet Take 50 mg by mouth daily.  . Multiple Vitamins-Minerals (MULTIVITAMIN WITH MINERALS) tablet Take 1 tablet by mouth daily.  . pantoprazole (PROTONIX) 40 MG tablet Take 1 tablet (40 mg total) by mouth 2 (two) times daily. (Patient taking differently: Take 40 mg by mouth daily. )  . traMADol (ULTRAM) 50 MG tablet Take 1/2-1 tablet three times daily as needed  . [DISCONTINUED] glimepiride (AMARYL) 2 MG  tablet Take 4 mg by mouth daily before breakfast.   . [  DISCONTINUED] Calcium Carbonate-Vitamin D (CALCIUM-VITAMIN D) 500-200 MG-UNIT per tablet Take 1 tablet by mouth 2 (two) times daily with a meal.   No facility-administered encounter medications on file as of 07/21/2018.     Allergies  Allergen Reactions  . Hydrochlorothiazide Other (See Comments)    Taken from faxed notes of dr Wylene Simmer (to short stay)  THROMBOCYTOPENIA  . Prilosec [Omeprazole] Diarrhea    Immunization History  Administered Date(s) Administered  . Influenza Split 09/01/2011, 09/15/2012  . Influenza, High Dose Seasonal PF 09/10/2017  . Influenza,inj,Quad PF,6+ Mos 08/31/2013, 07/31/2016  . Influenza-Unspecified 09/06/2015  . PPD Test 05/11/2015, 05/14/2015  . Pneumococcal Conjugate-13 07/20/2014  . Pneumococcal Polysaccharide-23 11/11/2007    Current Medications, Allergies, Past Medical History, Past Surgical History, Family History, and Social History were reviewed in [employer] record.   Review of Systems             All symptoms NEG except where BOLDED >>  Constitutional:  F/C/S, fatigue, anorexia, unexpected weight change. HEENT:  HA, visual changes, hearing loss, earache, nasal symptoms, sore throat, mouth sores, hoarseness. Resp:  cough, sputum, hemoptysis; SOB, tightness, wheezing. Cardio:  CP, palpit, DOE, orthopnea, edema. GI:  N/V/D/C, blood in stool; reflux, abd pain, distention, gas. GU:  dysuria, freq, urgency, hematuria, flank pain, voiding difficulty. MS:  joint pain, swelling, tenderness, decr ROM; neck pain, back pain, etc. Neuro:  HA, tremors, seizures, dizziness, syncope, weakness, numbness, gait abn. Skin:  suspicious lesions or skin rash. Heme:  adenopathy, bruising, bleeding. Psyche:  confusion, agitation, sleep disturbance, hallucinations, anxiety, depression suicidal.   Objective:   Physical Exam       Vital Signs:  Reviewed...   General:  WD, petite, 84  y/o Lauren Avery in NAD; alert & oriented; pleasant & cooperative... HEENT:  [place]/AT; Conjunctiva- pink, Sclera- nonicteric, EOM-wnl, PERRLA, EACs-clear, TMs-wnl; NOSE-clear; THROAT-clear & wnl.  Neck:  Supple w/ fair ROM; no JVD; normal carotid impulses w/o bruits; no thyromegaly or nodules palpated; no lymphadenopathy.  Chest:  Bibasilar velcro rales ~1/3rd to 1/2 the way up the back, no wheezing/ rhonchi/ consolidation... Heart:  Regular Rhythm; norm S1 & S2 without murmurs, rubs, or gallops detected. Abdomen:  Soft & nontender- no guarding or rebound; normal bowel sounds; no organomegaly or masses palpated. Ext:  Sl decr ROM; without deformities +arthritic changes; no varicose veins, venous insuffic, or edema;  Pulses intact w/o bruits. Neuro:  CNs II-XII intact; motor testing normal; sensory testing normal; gait normal & balance OK. Derm:  No lesions noted; no rash etc. Lymph:  No cervical, supraclavicular, axillary, or inguinal adenopathy palpated.   Assessment:      IMP >>     Diffuse pulmonary fibrosis in an 84 y/o lady> Given her age & co-morbidities I agree that she is not a cand for antifibrotic therapy; neither is she a good cand for Pred trial given her severe osteoporosis & mult fxs; therefore we will start ADVAIR500- 1 inhalation Bid and continue the ProairHFA rescue inhaler prn along w/ the Hycodan and Tramadol prn cough...    Deconditioned and poor exercise tolerance> We discussed this issue & the need for a gradual progressive exercise program- eg PulmRehab vs silver sneakers, [employer], Yoga, etc...    GERD and prob LPR> She needs a vigorous antireflux regimen w/ Protonix40 taken before dinner, NPO after dinner, elev HOB 6" blocks, etc...    HBP> on Amlod & Losar per DrTisovec...    DM> on Glimep per DrTisovec.Marland Kitchen  Osteoporosis & thoracic compression fxs, mult other fxs (hip, femur, clavicle, wrist, elbow)> on RECLAST + calcium/ MVI/ VitD, Norco, Tramadol...  PLAN >>  09/20/15>    Juwana has severe pulm fibrosis which creates quite a dilemma in a fragile 84 y/o lady; I agree that she is not a good cand for antifibrotic therapy & also not a good cand for Pred rx given her osteoporosis & mult fxs; Rec to try ADVAIR500- 1 inhalation Bid + her ProairHFA as needed; she has Hycodan and Tramadol to use for the cough; deconditioning is a serious issue for her as she has been way too sedentary over the yrs- rec to consider pulm rehb, silver sneakers, yoga, etc but she must be careful (no strenuous activ & no falling allowed!) 03/20/16>   Lauren Avery is asked to get with the program & see if she responds- use the AdvairBid, ProventilHFA prn, follow the vigorous antireflux regimen, use the cough suppressants as needed & start improving her exercise program...  09/23/16>   Lauren Avery is 89 & set in her ways- I have rec that she incr the Advair500 to Bid, take the PPI & follow the antireflux regimen, use the Hycodan & Tramadol prn cough; she needs incr exercise/ walking/ chair exercise/ etc 03/24/17>   Lauren Avery is asked to incr the Advair500 to Bid; OK to use the Tramadol prn back discomfort or cough & can add Tylenol to potentiate it's effect;  She still needs to consider my rec for incr exercise, better conditioning!  We will maintain Q42mo rov & sooner if needed for breathing problems 09/23/17>   Lauren Avery need her osteoporotic compression fxs attended by her PCP/ Ortho/ etc;  From the pulm perspective she is stable on the Advair500Bid, antireflux regimen, Tramadol...  01/21/18>   Family indicated that DrTisovec would not refill her Tramadol (helps her pain & cough) so we took care of this for her- #90- 1/2 to 1 tab Tid as needed... Continue Advair500, stay as active as poss, walking, etc...  07/21/18>   Lauren Avery has mult medical issues at 84 y/o. We follow her for pulm fibrosis- which appears to have been relatively stable over the last few yrs, not felt to be a candidate for antifibrotic therapy; she has in addition been  able to avoid any resp exacerbations;  REC to continue the Advair Bid, Tramadol prn, antireflux regimen, and incr her exercise program as able... we discussed my pending retirement in Jan2020 & we will have her follow up w/ DrIcard in about 6months.   Plan:     Patient's Medications  New Prescriptions   No medications on file  Previous Medications   ACETAMINOPHEN (TYLENOL) 325 MG TABLET    Take 2 tablets (650 mg total) by mouth every 6 (six) hours as needed.   AMLODIPINE (NORVASC) 5 MG TABLET    Take 5 mg by mouth every other day.    CHOLECALCIFEROL (VITAMIN D PO)    Take by mouth daily. 1 tab everyday   FLUTICASONE-SALMETEROL (WIXELA INHUB) 500-50 MCG/DOSE AEPB    Inhale 1 puff into the lungs 2 (two) times daily.   INSULIN DEGLUDEC (TRESIBA Keo)    Inject into the skin. 8  Units 1 injection daily   LOSARTAN (COZAAR) 50 MG TABLET    Take 50 mg by mouth daily.   MULTIPLE VITAMINS-MINERALS (MULTIVITAMIN WITH MINERALS) TABLET    Take 1 tablet by mouth daily.   PANTOPRAZOLE (PROTONIX) 40 MG TABLET    Take 1 tablet (40 mg total)  by mouth 2 (two) times daily.   TRAMADOL (ULTRAM) 50 MG TABLET    Take 1/2-1 tablet three times daily as needed  Modified Medications   No medications on file  Discontinued Medications   GLIMEPIRIDE (AMARYL) 2 MG TABLET    Take 4 mg by mouth daily before breakfast.

## 2018-08-10 ENCOUNTER — Encounter: Payer: Self-pay | Admitting: Podiatry

## 2018-08-10 ENCOUNTER — Ambulatory Visit (INDEPENDENT_AMBULATORY_CARE_PROVIDER_SITE_OTHER): Payer: Medicare Other | Admitting: Podiatry

## 2018-08-10 DIAGNOSIS — B351 Tinea unguium: Secondary | ICD-10-CM | POA: Diagnosis not present

## 2018-08-10 DIAGNOSIS — M79676 Pain in unspecified toe(s): Secondary | ICD-10-CM

## 2018-08-10 DIAGNOSIS — E1142 Type 2 diabetes mellitus with diabetic polyneuropathy: Secondary | ICD-10-CM | POA: Diagnosis not present

## 2018-08-10 NOTE — Progress Notes (Signed)
Patient ID: Lauren Avery, female   DOB: 06/24/1927, 84 y.o.   MRN: [id number] Complaint:  Visit Type: Patient returns to my office for continued preventative foot care services. Complaint: Patient states" my nails have grown long and thick and become painful to walk and wear shoes" Patient has been diagnosed with DM with no foot complications. The patient presents for preventative foot care services. No changes to ROS  Podiatric Exam: Vascular: dorsalis pedis and posterior tibial pulses are palpable bilateral. Capillary return is immediate. Temperature gradient is WNL. Skin turgor WNL  Sensorium: Diminished  Semmes Weinstein monofilament test. Normal tactile sensation bilaterally. Nail Exam: Pt has thick disfigured discolored nails with subungual debris noted bilateral entire nail hallux through fifth toenails Ulcer Exam: There is no evidence of ulcer or pre-ulcerative changes or infection. Orthopedic Exam: Muscle tone and strength are WNL. No limitations in general ROM. No crepitus or effusions noted. Foot type and digits show no abnormalities. HAV  B/L. Skin: No Porokeratosis. No infection or ulcers  Diagnosis:  Onychomycosis, , Pain in right toe, pain in left toes  Treatment & Plan Procedures and Treatment: Consent by patient was obtained for treatment procedures. The patient understood the discussion of treatment and procedures well. All questions were answered thoroughly reviewed. Debridement of mycotic and hypertrophic toenails, 1 through 5 bilateral and clearing of subungual debris. No ulceration, no infection noted.  Return Visit-Office Procedure: Patient instructed to return to the office for a follow up visit 10 weeks  for continued evaluation and treatment.    Kaelah Hayashi DPM 

## 2018-08-12 DIAGNOSIS — N183 Chronic kidney disease, stage 3 (moderate): Secondary | ICD-10-CM | POA: Diagnosis not present

## 2018-08-12 DIAGNOSIS — E1165 Type 2 diabetes mellitus with hyperglycemia: Secondary | ICD-10-CM | POA: Diagnosis not present

## 2018-08-12 DIAGNOSIS — I10 Essential (primary) hypertension: Secondary | ICD-10-CM | POA: Diagnosis not present

## 2018-08-12 DIAGNOSIS — Z794 Long term (current) use of insulin: Secondary | ICD-10-CM | POA: Diagnosis not present

## 2018-09-07 DIAGNOSIS — N183 Chronic kidney disease, stage 3 (moderate): Secondary | ICD-10-CM | POA: Diagnosis not present

## 2018-09-07 DIAGNOSIS — I10 Essential (primary) hypertension: Secondary | ICD-10-CM | POA: Diagnosis not present

## 2018-09-07 DIAGNOSIS — E1129 Type 2 diabetes mellitus with other diabetic kidney complication: Secondary | ICD-10-CM | POA: Diagnosis not present

## 2018-09-07 DIAGNOSIS — M810 Age-related osteoporosis without current pathological fracture: Secondary | ICD-10-CM | POA: Diagnosis not present

## 2018-09-07 DIAGNOSIS — R82998 Other abnormal findings in urine: Secondary | ICD-10-CM | POA: Diagnosis not present

## 2018-09-09 DIAGNOSIS — E1165 Type 2 diabetes mellitus with hyperglycemia: Secondary | ICD-10-CM | POA: Diagnosis not present

## 2018-09-14 DIAGNOSIS — Z0000 Encounter for general adult medical examination without abnormal findings: Secondary | ICD-10-CM | POA: Diagnosis not present

## 2018-09-14 DIAGNOSIS — I10 Essential (primary) hypertension: Secondary | ICD-10-CM | POA: Diagnosis not present

## 2018-09-14 DIAGNOSIS — E1165 Type 2 diabetes mellitus with hyperglycemia: Secondary | ICD-10-CM | POA: Diagnosis not present

## 2018-09-14 DIAGNOSIS — E1129 Type 2 diabetes mellitus with other diabetic kidney complication: Secondary | ICD-10-CM | POA: Diagnosis not present

## 2018-09-20 DIAGNOSIS — Z23 Encounter for immunization: Secondary | ICD-10-CM | POA: Diagnosis not present

## 2018-10-19 ENCOUNTER — Encounter: Payer: Self-pay | Admitting: Podiatry

## 2018-10-19 ENCOUNTER — Ambulatory Visit (INDEPENDENT_AMBULATORY_CARE_PROVIDER_SITE_OTHER): Payer: Medicare Other | Admitting: Podiatry

## 2018-10-19 DIAGNOSIS — B351 Tinea unguium: Secondary | ICD-10-CM | POA: Diagnosis not present

## 2018-10-19 DIAGNOSIS — M79676 Pain in unspecified toe(s): Secondary | ICD-10-CM | POA: Diagnosis not present

## 2018-10-19 DIAGNOSIS — E119 Type 2 diabetes mellitus without complications: Secondary | ICD-10-CM | POA: Diagnosis not present

## 2018-10-19 DIAGNOSIS — I10 Essential (primary) hypertension: Secondary | ICD-10-CM | POA: Diagnosis not present

## 2018-10-19 DIAGNOSIS — E1142 Type 2 diabetes mellitus with diabetic polyneuropathy: Secondary | ICD-10-CM

## 2018-10-19 DIAGNOSIS — H25099 Other age-related incipient cataract, unspecified eye: Secondary | ICD-10-CM | POA: Diagnosis not present

## 2018-10-19 DIAGNOSIS — H5211 Myopia, right eye: Secondary | ICD-10-CM | POA: Diagnosis not present

## 2018-10-19 DIAGNOSIS — H5203 Hypermetropia, bilateral: Secondary | ICD-10-CM | POA: Diagnosis not present

## 2018-10-19 NOTE — Progress Notes (Signed)
Patient ID: Lauren Avery, female   DOB: 11/23/1927, 84 y.o.   MRN: [id number] Complaint:  Visit Type: Patient returns to my office for continued preventative foot care services. Complaint: Patient states" my nails have grown long and thick and become painful to walk and wear shoes" Patient has been diagnosed with DM with no foot complications. The patient presents for preventative foot care services. No changes to ROS  Podiatric Exam: Vascular: dorsalis pedis and posterior tibial pulses are palpable bilateral. Capillary return is immediate. Temperature gradient is WNL. Skin turgor WNL  Sensorium: Diminished  Semmes Weinstein monofilament test. Normal tactile sensation bilaterally. Nail Exam: Pt has thick disfigured discolored nails with subungual debris noted bilateral entire nail hallux through fifth toenails Ulcer Exam: There is no evidence of ulcer or pre-ulcerative changes or infection. Orthopedic Exam: Muscle tone and strength are WNL. No limitations in general ROM. No crepitus or effusions noted. Foot type and digits show no abnormalities. HAV  B/L. Skin: No Porokeratosis. No infection or ulcers  Diagnosis:  Onychomycosis, , Pain in right toe, pain in left toes  Treatment & Plan Procedures and Treatment: Consent by patient was obtained for treatment procedures. The patient understood the discussion of treatment and procedures well. All questions were answered thoroughly reviewed. Debridement of mycotic and hypertrophic toenails, 1 through 5 bilateral and clearing of subungual debris. No ulceration, no infection noted.  Return Visit-Office Procedure: Patient instructed to return to the office for a follow up visit 10 weeks  for continued evaluation and treatment.    Raina Sole DPM 

## 2018-10-26 DIAGNOSIS — E1165 Type 2 diabetes mellitus with hyperglycemia: Secondary | ICD-10-CM | POA: Diagnosis not present

## 2018-10-26 DIAGNOSIS — N183 Chronic kidney disease, stage 3 (moderate): Secondary | ICD-10-CM | POA: Diagnosis not present

## 2018-10-26 DIAGNOSIS — Z794 Long term (current) use of insulin: Secondary | ICD-10-CM | POA: Diagnosis not present

## 2018-10-26 DIAGNOSIS — I10 Essential (primary) hypertension: Secondary | ICD-10-CM | POA: Diagnosis not present

## 2018-11-05 DIAGNOSIS — E1165 Type 2 diabetes mellitus with hyperglycemia: Secondary | ICD-10-CM | POA: Diagnosis not present

## 2018-12-03 DIAGNOSIS — M25512 Pain in left shoulder: Secondary | ICD-10-CM | POA: Diagnosis not present

## 2018-12-28 ENCOUNTER — Ambulatory Visit (INDEPENDENT_AMBULATORY_CARE_PROVIDER_SITE_OTHER): Payer: Medicare Other | Admitting: Podiatry

## 2018-12-28 ENCOUNTER — Encounter: Payer: Self-pay | Admitting: Podiatry

## 2018-12-28 DIAGNOSIS — E1142 Type 2 diabetes mellitus with diabetic polyneuropathy: Secondary | ICD-10-CM | POA: Diagnosis not present

## 2018-12-28 DIAGNOSIS — M79676 Pain in unspecified toe(s): Secondary | ICD-10-CM

## 2018-12-28 DIAGNOSIS — B351 Tinea unguium: Secondary | ICD-10-CM | POA: Diagnosis not present

## 2018-12-28 DIAGNOSIS — E1165 Type 2 diabetes mellitus with hyperglycemia: Secondary | ICD-10-CM | POA: Diagnosis not present

## 2018-12-28 NOTE — Progress Notes (Signed)
Patient ID: Lauren Avery, female   DOB: Aug 22, 1927, 85 y.o.   MRN: [id number] Complaint:  Visit Type: Patient returns to my office for continued preventative foot care services. Complaint: Patient states" my nails have grown long and thick and become painful to walk and wear shoes" Patient has been diagnosed with DM with no foot complications. The patient presents for preventative foot care services. No changes to ROS  Podiatric Exam: Vascular: dorsalis pedis and posterior tibial pulses are palpable bilateral. Capillary return is immediate. Temperature gradient is WNL. Skin turgor WNL  Sensorium: Diminished  Semmes Weinstein monofilament test. Normal tactile sensation bilaterally. Nail Exam: Pt has thick disfigured discolored nails with subungual debris noted bilateral entire nail hallux through fifth toenails Ulcer Exam: There is no evidence of ulcer or pre-ulcerative changes or infection. Orthopedic Exam: Muscle tone and strength are WNL. No limitations in general ROM. No crepitus or effusions noted. Foot type and digits show no abnormalities. HAV  B/L. Skin: No Porokeratosis. No infection or ulcers  Diagnosis:  Onychomycosis, , Pain in right toe, pain in left toes  Treatment & Plan Procedures and Treatment: Consent by patient was obtained for treatment procedures. The patient understood the discussion of treatment and procedures well. All questions were answered thoroughly reviewed. Debridement of mycotic and hypertrophic toenails, 1 through 5 bilateral and clearing of subungual debris. No ulceration, no infection noted.  Return Visit-Office Procedure: Patient instructed to return to the office for a follow up visit 10 weeks  for continued evaluation and treatment.    Helane Gunther DPM

## 2019-01-18 ENCOUNTER — Ambulatory Visit (INDEPENDENT_AMBULATORY_CARE_PROVIDER_SITE_OTHER): Payer: Medicare Other | Admitting: Pulmonary Disease

## 2019-01-18 ENCOUNTER — Encounter: Payer: Self-pay | Admitting: Pulmonary Disease

## 2019-01-18 VITALS — BP 120/60 | HR 94 | Ht 62.0 in | Wt 111.8 lb

## 2019-01-18 DIAGNOSIS — R05 Cough: Secondary | ICD-10-CM | POA: Diagnosis not present

## 2019-01-18 DIAGNOSIS — R058 Other specified cough: Secondary | ICD-10-CM

## 2019-01-18 DIAGNOSIS — J84112 Idiopathic pulmonary fibrosis: Secondary | ICD-10-CM

## 2019-01-18 NOTE — Patient Instructions (Addendum)
Thank you for visiting Dr. Tonia Brooms at [facility]. Today we recommend the following: Orders Placed This Encounter  Procedures  . CT Chest High Resolution   Return in about 4 weeks (around 02/15/2019).

## 2019-01-18 NOTE — Progress Notes (Signed)
Synopsis: Referred in Feb 2020 to est care, prior pulmonary was Dr. Kriste Basque.  Subjective:   PATIENT ID: Lauren Avery GENDER: female DOB: 1927-01-16, MRN: [id number]  Chief Complaint  Patient presents with  . Follow-up    SOB increasing and cough (prod clear/white) over past couple of months.      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 Currently does "nothing" she states.  She is around her house most of the day.  Her family comes and checks on her routinely.  She is able to live alone and care for herself.  She is able to complete all of her activities of daily living.  Her family does help her with grocery shopping.   She has been followed since 2008 for pulmonary fibrosis.  She has prior CT imaging that was completed in 2008 which were reviewed today in the office.  She has had subsequent chest x-rays for follow-up of her pulmonary fibrosis.  There has been no clear exposure or clear etiology that has been defined throughout the years.  However seems to be rather dormant and on progressive.  She does complain of ongoing persistent daily dry cough.  This is her only respiratory complaint today.  Cough is nonproductive in nature and denies hemoptysis.She does however have daily heartburn symptoms and indigestion.    Past Medical History:  Diagnosis Date  . Diabetes mellitus   . Fracture 10/2012   left wrist  . Fracture of left clavicle    12/12 wore a sling  . GERD (gastroesophageal reflux disease)   . Hypertension   . Osteoporosis   . Pulmonary fibrosis (HCC)      Family History  Problem Relation Age of Onset  . Pancreatic cancer Brother   . Stroke Mother   . Diabetes Father      Past Surgical History:  Procedure Laterality Date  . FEMUR FRACTURE SURGERY      6/08 partial hip replacement  right  . ganglion cyst removal     left wrist 1960  . HIP FRACTURE SURGERY     1984 pin and plate placed right  . HIP SURGERY     pin and plate removed 01/7168  . ORIF ELBOW FRACTURE Right 02/18/2013   Procedure: OPEN REDUCTION INTERNAL FIXATION (ORIF) ELBOW/OLECRANON FRACTURE;  Surgeon: Nadara Mustard, MD;  Location: [facility] OR;  Service: Orthopedics;  Laterality: Right;  . ORIF ELBOW FRACTURE Left 05/09/2015   Procedure: OPEN REDUCTION INTERNAL FIXATION (ORIF) LEFT MEDIAL HUMERAL CONDYLE FRACTURE;  Surgeon: Tarry Kos, MD;  Location: [facility] OR;  Service: Orthopedics;  Laterality: Left;    Social History   Socioeconomic History  . Marital status: Widowed    Spouse name: Not on file  . Number of children: Y  . Years of education: Not on file  . Highest education level: Not on file  Occupational History  . Occupation: retired Architectural technologist  . Financial resource strain: Not on file  . Food insecurity:    Worry: Not on file    Inability: Not on file  . Transportation needs:    Medical: Not on file    Non-medical: Not on file  Tobacco Use  . Smoking status: Never Smoker  . Smokeless tobacco: Never Used  Substance and Sexual Activity  . Alcohol use: No  .  Drug use: No  . Sexual activity: Not on file  Lifestyle  . Physical activity:    Days per week: Not on file    Minutes per session: Not on file  . Stress: Not on file  Relationships  . Social connections:    Talks on phone: Not on file    Gets together: Not on file    Attends religious service: Not on file    Active member of club or organization: Not on file    Attends meetings of clubs or organizations: Not on file    Relationship status: Not on file  . Intimate partner violence:    Fear of current or ex partner: Not on file    Emotionally abused: Not on file    Physically abused: Not on file    Forced sexual activity: Not on file  Other Topics Concern  . Not on file  Social History Narrative  . Not on file       Allergies  Allergen Reactions  . Hydrochlorothiazide Other (See Comments) and Itching    Taken from faxed notes of dr Wylene Simmer (to short stay)  THROMBOCYTOPENIA Taken from faxed notes of dr Wylene Simmer (to short stay)  THROMBOCYTOPENIA  . Prilosec [Omeprazole] Diarrhea     Outpatient Medications Prior to Visit  Medication Sig Dispense Refill  . acetaminophen (TYLENOL) 325 MG tablet Take 2 tablets (650 mg total) by mouth every 6 (six) hours as needed. (Patient taking differently: Take 650 mg by mouth every 6 (six) hours as needed for mild pain, fever or headache. )    . calcium-vitamin D (OSCAL WITH D) 500-200 MG-UNIT tablet Take 1 tablet by mouth.    . Cholecalciferol (VITAMIN D PO) Take by mouth daily. 1 tab everyday    . Fluticasone-Salmeterol (WIXELA INHUB) 500-50 MCG/DOSE AEPB Inhale 1 puff into the lungs 2 (two) times daily. 60 each 5  . losartan (COZAAR) 50 MG tablet Take 50 mg by mouth daily.    . Multiple Vitamins-Minerals (MULTIVITAMIN WITH MINERALS) tablet Take 1 tablet by mouth daily.    Marland Kitchen NOVOFINE PLUS 32G X 4 MM MISC U UTD WITH TRESIBA  5  . ONETOUCH VERIO test strip USE TO SELF MONITOR BLOOD GLUCOSE 2 TIMES DAILY AND AS NEEDED  6  . pantoprazole (PROTONIX) 40 MG tablet Take 1 tablet (40 mg total) by mouth 2 (two) times daily. (Patient taking differently: Take 40 mg by mouth daily. ) 60 tablet 6  . traMADol (ULTRAM) 50 MG tablet Take 1/2-1 tablet three times daily as needed 90 tablet 2  . TRESIBA FLEXTOUCH 100 UNIT/ML SOPN FlexTouch Pen     . triamcinolone acetonide (TRIESENCE) 40 MG/ML SUSP Inject into the articular space.    . lidocaine (XYLOCAINE) 1 % (with preservative) injection by Infiltration route.    Marland Kitchen amLODipine (NORVASC) 5 MG tablet Take 5 mg by mouth every other day.      No facility-administered medications prior to visit.     Review of Systems  Constitutional: Negative for chills, fever, malaise/fatigue and weight loss.  HENT: Negative for hearing loss, sore  throat and tinnitus.   Eyes: Negative for blurred vision and double vision.  Respiratory: Positive for cough. Negative for hemoptysis, sputum production, shortness of breath, wheezing and stridor.   Cardiovascular: Negative for chest pain, palpitations, orthopnea, leg swelling and PND.  Gastrointestinal: Negative for abdominal pain, constipation, diarrhea, heartburn, nausea and vomiting.  Genitourinary: Negative for dysuria, hematuria and urgency.  Musculoskeletal: Negative for joint  pain and myalgias.  Skin: Negative for itching and rash.  Neurological: Negative for dizziness, tingling, weakness and headaches.  Endo/Heme/Allergies: Negative for environmental allergies. Does not bruise/bleed easily.  Psychiatric/Behavioral: Negative for depression. The patient is not nervous/anxious and does not have insomnia.   All other systems reviewed and are negative.    Objective:  Physical Exam Vitals signs reviewed.  Constitutional:      General: She is not in acute distress.    Appearance: She is well-developed.  HENT:     Head: Normocephalic and atraumatic.  Eyes:     General: No scleral icterus.    Conjunctiva/sclera: Conjunctivae normal.     Pupils: Pupils are equal, round, and reactive to light.  Neck:     Musculoskeletal: Neck supple.     Vascular: No JVD.     Trachea: No tracheal deviation.  Cardiovascular:     Rate and Rhythm: Normal rate and regular rhythm.     Heart sounds: Normal heart sounds. No murmur.  Pulmonary:     Effort: Pulmonary effort is normal. No tachypnea, accessory muscle usage or respiratory distress.     Breath sounds: No stridor. No wheezing, rhonchi or rales.     Comments: Bibasilar inspiratory crackles Abdominal:     General: Bowel sounds are normal. There is no distension.     Palpations: Abdomen is soft.     Tenderness: There is no abdominal tenderness.  Musculoskeletal:        General: No tenderness.  Lymphadenopathy:     Cervical: No cervical  adenopathy.  Skin:    General: Skin is warm and dry.     Capillary Refill: Capillary refill takes less than 2 seconds.     Findings: No rash.  Neurological:     Mental Status: She is alert and oriented to person, place, and time.  Psychiatric:        Behavior: Behavior normal.      Vitals:   01/18/19 1336  BP: 120/60  Pulse: 94  SpO2: 97%  Weight: 111 lb 12.8 oz (50.7 kg)  Height: 5\' 2"  (1.575 m)   97% on RA BMI Readings from Last 3 Encounters:  01/18/19 20.45 kg/m  07/21/18 20.92 kg/m  01/21/18 21.07 kg/m   Wt Readings from Last 3 Encounters:  01/18/19 111 lb 12.8 oz (50.7 kg)  07/21/18 114 lb 6.4 oz (51.9 kg)  01/21/18 115 lb 3.2 oz (52.3 kg)     CBC    Component Value Date/Time   WBC 8.7 05/16/2015   WBC 7.9 05/10/2015 0435   RBC 3.70 (L) 05/10/2015 0435   HGB 12.3 05/16/2015   HCT 36 05/16/2015   PLT 180 05/16/2015   MCV 89.2 05/10/2015 0435   MCH 28.9 05/10/2015 0435   MCHC 32.4 05/10/2015 0435   RDW 13.9 05/10/2015 0435   LYMPHSABS 2.3 05/10/2015 0435   MONOABS 0.7 05/10/2015 0435   EOSABS 0.1 05/10/2015 0435   BASOSABS 0.0 05/10/2015 0435    Chest Imaging: CT chest 2008 Reviewed imaging today in the office, bilateral interstitial fibrosis with peripheral honeycombing.  No prior follow-up. The patient's images have been independently reviewed by me.    Pulmonary Functions Testing Results: PFT Results Latest Ref Rng & Units 03/16/2014  FVC-Pre L 1.88  FVC-Predicted Pre % 88  FVC-Post L 1.92  FVC-Predicted Post % 89  Pre FEV1/FVC % % 85  Post FEV1/FCV % % 87  FEV1-Pre L 1.59  FEV1-Predicted Pre % 101  FEV1-Post L 1.67  DLCO UNC% % 43  DLCO COR %Predicted % 71  TLC L 3.31  TLC % Predicted % 69  RV % Predicted % 61     FeNO: None   Pathology: None  Echocardiogram: None   Heart Catheterization: None     Assessment & Plan:   IPF (idiopathic pulmonary fibrosis) (HCC) - Plan: CT Chest High Resolution  Non-productive  cough  Discussion:  This is an 85 year old female with a history of prolonged nonproductive cough and a prior label of idiopathic pulmonary fibrosis.  She has never had a lung biopsy and has not had a recent high-resolution CT of the chest.  This is been followed clinically throughout the years and now with ongoing persistent worsening cough.  And a risk, benefits and alternatives discussion of further investigation into her problems related to her pulmonary fibrosis the patient has made the decision that she would like to continue a more conservative approach but would be okay with further evaluation with high-resolution CT imaging.  I do believe this would be the next best step in further evaluation to see if there has been progression and fibrosis seen on imaging.  Her clinical behavior would not be that consistent with IPF.  She may very well have a separate pulmonary fibrosing syndrome however she has been relatively stable throughout all of these years.  Therefore pending the HRCT results we will also consider pulmonary function tests in the future.  Patient to return to see [place] in clinic after the HRCT results to discuss further options.  As for her cough she should continue to take her proton pump inhibitor daily.  She has currently been taking this "as needed".  Which per the daughter means she has not been taking it at all.  She does have daily heartburn symptoms.  Patient was counseled on taking this medication daily to see if there is improvement.    Greater than 50% of this patient's 40-minute office visit was spent face-to-face discussing above recommendations and treatment plan as well as reviewing prior chest imaging results with the patient in the office.   Current Outpatient Medications:  .  acetaminophen (TYLENOL) 325 MG tablet, Take 2 tablets (650 mg total) by mouth every 6 (six) hours as needed. (Patient taking differently: Take 650 mg by mouth every 6 (six) hours as needed  for mild pain, fever or headache. ), Disp: , Rfl:  .  calcium-vitamin D (OSCAL WITH D) 500-200 MG-UNIT tablet, Take 1 tablet by mouth., Disp: , Rfl:  .  Cholecalciferol (VITAMIN D PO), Take by mouth daily. 1 tab everyday, Disp: , Rfl:  .  Fluticasone-Salmeterol (WIXELA INHUB) 500-50 MCG/DOSE AEPB, Inhale 1 puff into the lungs 2 (two) times daily., Disp: 60 each, Rfl: 5 .  losartan (COZAAR) 50 MG tablet, Take 50 mg by mouth daily., Disp: , Rfl:  .  Multiple Vitamins-Minerals (MULTIVITAMIN WITH MINERALS) tablet, Take 1 tablet by mouth daily., Disp: , Rfl:  .  NOVOFINE PLUS 32G X 4 MM MISC, U UTD WITH TRESIBA, Disp: , Rfl: 5 .  ONETOUCH VERIO test strip, USE TO SELF MONITOR BLOOD GLUCOSE 2 TIMES DAILY AND AS NEEDED, Disp: , Rfl: 6 .  pantoprazole (PROTONIX) 40 MG tablet, Take 1 tablet (40 mg total) by mouth 2 (two) times daily. (Patient taking differently: Take 40 mg by mouth daily. ), Disp: 60 tablet, Rfl: 6 .  traMADol (ULTRAM) 50 MG tablet, Take 1/2-1 tablet three times daily as needed, Disp: 90 tablet, Rfl: 2 .  TRESIBA FLEXTOUCH 100 UNIT/ML SOPN FlexTouch Pen, , Disp: , Rfl:  .  triamcinolone acetonide (TRIESENCE) 40 MG/ML SUSP, Inject into the articular space., Disp: , Rfl:  .  amLODipine (NORVASC) 5 MG tablet, Take 5 mg by mouth every other day. , Disp: , Rfl:    Josephine Igo, DO [facility] 01/18/2019 1:40 PM

## 2019-01-25 ENCOUNTER — Ambulatory Visit
Admission: RE | Admit: 2019-01-25 | Discharge: 2019-01-25 | Disposition: A | Discharge status: Home / Self Care | Payer: Medicare Other | Source: Ambulatory Visit | Attending: Pulmonary Disease | Admitting: Pulmonary Disease

## 2019-01-25 DIAGNOSIS — J84112 Idiopathic pulmonary fibrosis: Secondary | ICD-10-CM

## 2019-01-25 DIAGNOSIS — J8410 Pulmonary fibrosis, unspecified: Secondary | ICD-10-CM | POA: Diagnosis not present

## 2019-01-27 DIAGNOSIS — Z794 Long term (current) use of insulin: Secondary | ICD-10-CM | POA: Diagnosis not present

## 2019-01-27 DIAGNOSIS — B369 Superficial mycosis, unspecified: Secondary | ICD-10-CM | POA: Diagnosis not present

## 2019-01-27 DIAGNOSIS — E1165 Type 2 diabetes mellitus with hyperglycemia: Secondary | ICD-10-CM | POA: Diagnosis not present

## 2019-01-27 DIAGNOSIS — N183 Chronic kidney disease, stage 3 (moderate): Secondary | ICD-10-CM | POA: Diagnosis not present

## 2019-02-14 ENCOUNTER — Telehealth: Payer: Self-pay

## 2019-02-14 NOTE — Telephone Encounter (Signed)
BI please advise on results for CT.

## 2019-02-14 NOTE — Telephone Encounter (Signed)
PCCM: She has evidence of fibrosis that we discussed in the office from her prior chest xrays. I think it would be a good idea for her to meet with Dr. Isaiah Serge at some point to discuss her ILD further. We can review her images and discuss further in the office.  Thanks BLI   Lauren Igo, DO [facility] 02/14/2019 5:12 PM

## 2019-02-14 NOTE — Telephone Encounter (Signed)
ATC patient's daughter, unable to reach, [facility]

## 2019-02-14 NOTE — Telephone Encounter (Signed)
Patient daughter Rosey Bath: She states she was told to call and report how she was doing on  the protonix. The protonix is helping and she wanted to let BI know. She also states they had an appt to go over the Chest CT but has since cancelled due to the corona. She would like the results of the Chest CT.   Call back # [phone]

## 2019-02-15 ENCOUNTER — Ambulatory Visit: Payer: Medicare Other | Admitting: Pulmonary Disease

## 2019-02-15 NOTE — Telephone Encounter (Signed)
Will close this message since everything has been taken care of.

## 2019-02-15 NOTE — Telephone Encounter (Signed)
Yes. That day for appointment is fine.

## 2019-02-15 NOTE — Telephone Encounter (Signed)
Call made to patient daughter (dpr) made aware of recommendations. Appt made with Dr. Isaiah Serge per [facility]. Unable to find a Tuesday afternoon will route to Dr. Isaiah Serge to be sure this appt date is okay.   PM please advise if this appt date it okay for a consult visit per BI. Unable to find a Tuesday afternoon due to scheduling conflict with family.

## 2019-02-15 NOTE — Telephone Encounter (Signed)
Pt's daughter, Rosey Bath, is returning call. Cb is [phone]

## 2019-02-17 ENCOUNTER — Ambulatory Visit: Payer: Medicare Other | Admitting: Pulmonary Disease

## 2019-03-14 DIAGNOSIS — I129 Hypertensive chronic kidney disease with stage 1 through stage 4 chronic kidney disease, or unspecified chronic kidney disease: Secondary | ICD-10-CM | POA: Diagnosis not present

## 2019-03-14 DIAGNOSIS — E1129 Type 2 diabetes mellitus with other diabetic kidney complication: Secondary | ICD-10-CM | POA: Diagnosis not present

## 2019-03-14 DIAGNOSIS — E1165 Type 2 diabetes mellitus with hyperglycemia: Secondary | ICD-10-CM | POA: Diagnosis not present

## 2019-03-14 DIAGNOSIS — E1142 Type 2 diabetes mellitus with diabetic polyneuropathy: Secondary | ICD-10-CM | POA: Diagnosis not present

## 2019-03-16 ENCOUNTER — Ambulatory Visit: Payer: Medicare Other | Admitting: Pulmonary Disease

## 2019-04-01 DIAGNOSIS — E1165 Type 2 diabetes mellitus with hyperglycemia: Secondary | ICD-10-CM | POA: Diagnosis not present

## 2019-04-14 ENCOUNTER — Other Ambulatory Visit: Payer: Self-pay

## 2019-04-14 ENCOUNTER — Ambulatory Visit (INDEPENDENT_AMBULATORY_CARE_PROVIDER_SITE_OTHER): Payer: Medicare Other | Admitting: Podiatry

## 2019-04-14 ENCOUNTER — Encounter: Payer: Self-pay | Admitting: Podiatry

## 2019-04-14 DIAGNOSIS — M79676 Pain in unspecified toe(s): Secondary | ICD-10-CM

## 2019-04-14 DIAGNOSIS — B351 Tinea unguium: Secondary | ICD-10-CM | POA: Diagnosis not present

## 2019-04-14 DIAGNOSIS — E1142 Type 2 diabetes mellitus with diabetic polyneuropathy: Secondary | ICD-10-CM

## 2019-04-14 NOTE — Progress Notes (Signed)
Patient ID: Lauren Avery, female   DOB: 07/07/1927, 85 y.o.   MRN: [id number] Complaint:  Visit Type: Patient returns to my office for continued preventative foot care services. Complaint: Patient states" my nails have grown long and thick and become painful to walk and wear shoes" Patient has been diagnosed with DM with no foot complications. The patient presents for preventative foot care services. No changes to ROS  Podiatric Exam: Vascular: dorsalis pedis and posterior tibial pulses are palpable bilateral. Capillary return is immediate. Temperature gradient is WNL. Skin turgor WNL  Sensorium: Diminished  Semmes Weinstein monofilament test. Normal tactile sensation bilaterally. Nail Exam: Pt has thick disfigured discolored nails with subungual debris noted bilateral entire nail hallux through fifth toenails Ulcer Exam: There is no evidence of ulcer or pre-ulcerative changes or infection. Orthopedic Exam: Muscle tone and strength are WNL. No limitations in general ROM. No crepitus or effusions noted. Foot type and digits show no abnormalities. HAV  B/L. Skin: No Porokeratosis. No infection or ulcers  Diagnosis:  Onychomycosis, , Pain in right toe, pain in left toes  Treatment & Plan Procedures and Treatment: Consent by patient was obtained for treatment procedures. The patient understood the discussion of treatment and procedures well. All questions were answered thoroughly reviewed. Debridement of mycotic and hypertrophic toenails, 1 through 5 bilateral and clearing of subungual debris. No ulceration, no infection noted.  Return Visit-Office Procedure: Patient instructed to return to the office for a follow up visit 10 weeks  for continued evaluation and treatment.    Kataleah Bejar DPM 

## 2019-04-20 ENCOUNTER — Other Ambulatory Visit: Payer: Self-pay | Admitting: Pulmonary Disease

## 2019-05-05 DIAGNOSIS — E1129 Type 2 diabetes mellitus with other diabetic kidney complication: Secondary | ICD-10-CM | POA: Diagnosis not present

## 2019-05-05 DIAGNOSIS — Z794 Long term (current) use of insulin: Secondary | ICD-10-CM | POA: Diagnosis not present

## 2019-05-05 DIAGNOSIS — I10 Essential (primary) hypertension: Secondary | ICD-10-CM | POA: Diagnosis not present

## 2019-05-05 DIAGNOSIS — N183 Chronic kidney disease, stage 3 (moderate): Secondary | ICD-10-CM | POA: Diagnosis not present

## 2019-05-09 ENCOUNTER — Other Ambulatory Visit: Payer: Self-pay

## 2019-05-09 ENCOUNTER — Encounter: Payer: Self-pay | Admitting: Orthopedic Surgery

## 2019-05-09 ENCOUNTER — Ambulatory Visit (INDEPENDENT_AMBULATORY_CARE_PROVIDER_SITE_OTHER): Payer: Medicare Other | Admitting: Orthopedic Surgery

## 2019-05-09 ENCOUNTER — Ambulatory Visit: Payer: Self-pay

## 2019-05-09 VITALS — Ht 62.0 in | Wt 111.8 lb

## 2019-05-09 DIAGNOSIS — M25551 Pain in right hip: Secondary | ICD-10-CM | POA: Diagnosis not present

## 2019-05-09 DIAGNOSIS — M810 Age-related osteoporosis without current pathological fracture: Secondary | ICD-10-CM | POA: Diagnosis not present

## 2019-05-09 NOTE — Progress Notes (Signed)
Office Visit Note   Patient: Lauren Avery           Date of Birth: Nov 13, 1927           MRN: [id number] Visit Date: 05/09/2019              Requested by: Haywood Pao, MD [address] [place], [place] [address] PCP: Osborne Casco Fransico Him, MD  Chief Complaint  Patient presents with  . Right Leg - Pain      HPI: Patient is a 85 year old woman who presents in follow-up for right hip hemiarthroplasty patient states she does have pain in the right hip with prolonged walking and weightbearing.  She states the pain is laterally over the thigh.  Patient also reports some groin pain on the left with weightbearing.  Assessment & Plan: Visit Diagnoses:  1. Right hip pain   2. Age-related osteoporosis without current pathological fracture     Plan: Recommended continue with vitamin D3 and calcium supplements recommended that she can continue with her chair exercises continue with her Rollator walker but would recommend minimizing her weightbearing.  Follow-Up Instructions: Return if symptoms worsen or fail to improve.   Ortho Exam  Patient is alert, oriented, no adenopathy, well-dressed, normal affect, normal respiratory effort. Examination patient can get from a sit to a stand position without assistance.  There is no pain with passive range of motion of the right hip.  Radiographs shows degenerative arthritis of her left hip which is consistent with her groin pain.  Imaging: Xr Hip Unilat W Or W/o Pelvis 2-3 Views Right  Result Date: 05/09/2019 2 view radiographs of the right hip shows a bipolar hemiarthroplasty with lucency around the cement mantle.  Lucency is worse laterally no fractures.  No images are attached to the encounter.  Labs: Lab Results  Component Value Date   HGBA1C 7.6 (H) 05/09/2015   ESRSEDRATE 29 (H) 02/24/2012   CRP 0.37 02/24/2012   REPTSTATUS 02/18/2013 FINAL 02/17/2013   CULT NO GROWTH 02/17/2013     Lab Results  Component Value Date   ALBUMIN 3.4 (L) 02/18/2013    Body mass index is 20.45 kg/m.  Orders:  Orders Placed This Encounter  Procedures  . XR HIP UNILAT W OR W/O PELVIS 2-3 VIEWS RIGHT   No orders of the defined types were placed in this encounter.    Procedures: No procedures performed  Clinical Data: No additional findings.  ROS:  All other systems negative, except as noted in the HPI. Review of Systems  Objective: Vital Signs: Ht 5\' 2"  (1.575 m)   Wt 111 lb 12.8 oz (50.7 kg)   BMI 20.45 kg/m   Specialty Comments:  No specialty comments available.  PMFS History: Patient Active Problem List   Diagnosis Date Noted  . Physical deconditioning 09/23/2017  . Laryngopharyngeal reflux (LPR) 09/20/2015  . Fracture of elbow, medial condyle, left, closed 05/09/2015  . Fracture of medial condyle of elbow 05/09/2015  . Long term (current) use of anticoagulants 03/21/2013  . GERD (gastroesophageal reflux disease) 02/24/2013  . Constipation 02/24/2013  . Fracture of elbow, medial condyle, right, closed 02/19/2013    Class: Acute  . Closed fracture of single pubic ramus of pelvis ([place]) 02/19/2013    Class: Acute  . Bilateral sacral insufficiency fracture 02/19/2013    Class: Chronic  . IPF (idiopathic pulmonary fibrosis) ([place]) 02/24/2012  . Chronic cough 02/24/2012  . DM 11/12/2007  . Essential hypertension 11/12/2007   Past Medical History:  Diagnosis Date  . Diabetes mellitus   . Fracture 10/2012   left wrist  . Fracture of left clavicle    12/12 wore a sling  . GERD (gastroesophageal reflux disease)   . Hypertension   . Osteoporosis   . Pulmonary fibrosis ([facility])     Family History  Problem Relation Age of Onset  . Pancreatic cancer Brother   . Stroke Mother   . Diabetes Father     Past Surgical History:  Procedure Laterality Date  . FEMUR FRACTURE SURGERY      6/08 partial hip replacement right  . ganglion cyst removal     left wrist 1960  . HIP FRACTURE SURGERY     1984  pin and plate placed right  . HIP SURGERY     pin and plate removed 9/56218/1985  . ORIF ELBOW FRACTURE Right 02/18/2013   Procedure: OPEN REDUCTION INTERNAL FIXATION (ORIF) ELBOW/OLECRANON FRACTURE;  Surgeon: Nadara MustardMarcus V , MD;  Location: [facility] OR;  Service: Orthopedics;  Laterality: Right;  . ORIF ELBOW FRACTURE Left 05/09/2015   Procedure: OPEN REDUCTION INTERNAL FIXATION (ORIF) LEFT MEDIAL HUMERAL CONDYLE FRACTURE;  Surgeon: Tarry KosNaiping M Xu, MD;  Location: [facility] OR;  Service: Orthopedics;  Laterality: Left;   Social History   Occupational History  . Occupation: retired Airline pilotaccountant  Tobacco Use  . Smoking status: Never Smoker  . Smokeless tobacco: Never Used  Substance and Sexual Activity  . Alcohol use: No  . Drug use: No  . Sexual activity: Not on file

## 2019-05-10 ENCOUNTER — Ambulatory Visit: Payer: Medicare Other | Admitting: Pulmonary Disease

## 2019-05-10 ENCOUNTER — Ambulatory Visit (INDEPENDENT_AMBULATORY_CARE_PROVIDER_SITE_OTHER): Payer: Medicare Other | Admitting: Pulmonary Disease

## 2019-05-10 ENCOUNTER — Encounter: Payer: Self-pay | Admitting: Pulmonary Disease

## 2019-05-10 ENCOUNTER — Other Ambulatory Visit: Payer: Self-pay

## 2019-05-10 VITALS — BP 118/66 | HR 79 | Temp 97.9°F | Ht 62.0 in | Wt 112.8 lb

## 2019-05-10 DIAGNOSIS — K219 Gastro-esophageal reflux disease without esophagitis: Secondary | ICD-10-CM | POA: Diagnosis not present

## 2019-05-10 DIAGNOSIS — J84112 Idiopathic pulmonary fibrosis: Secondary | ICD-10-CM | POA: Diagnosis not present

## 2019-05-10 NOTE — Patient Instructions (Signed)
I have reviewed the CT scan which shows progressive fibrosis since 2008  This could be idiopathic pulmonary fibrosis or from underlying connective tissue disease Since symptomatically you are doing okay and as per our discussion we have agreed to defer any work-up We have made this decision since any treatment for pulmonary fibrosis has side effects which we want to avoid  Follow-up with Dr. Valeta Harms I am available to meet you again as needed.

## 2019-05-10 NOTE — Progress Notes (Signed)
Lauren Avery    [id number]    06/15/1927  Primary Care Physician:Tisovec, Fransico Him, MD  Referring Physician: Haywood Pao, MD [address] [place], [place] [address]  Chief complaint: Consult for interstitial lung disease  HPI: 85 year old with history of diabetes, interstitial lung disease, GERD. Referred to allergy clinic for second opinion regarding lung fibrosis  Previously followed by Dr. Lenna Gilford with CT scan in 2008 showing basal fibrosis with honeycombing.  She had a follow-up CT scan this year which showed progression of fibrosis in UIP pattern.  Clinically she has mild symptoms of dyspnea on exertion with slow progression for the past 12 years.  She lives by herself and is able to complete most activities of daily living.  Has nonproductive cough.  No sputum production or wheezing.  She has symptoms of joint pain, stiffness which is attributed to osteoarthritis.  No raynauds, rash, difficulty swallowing or dysphagia. Has history of GERD for which she is on Protonix.  Pets: No pets, birds Occupation: Retired Optometrist Exposures, ILD questionnaire 6/10?2020: Has some exposure to damp places but no overt mold exposure.  No other exposures noted.  No  hot tub, Jacuzzi, humidifier Smoking history: Never smoker.  Exposed to secondhand smoke Travel history: No significant travel history Relevant family history: No significant family history of lung disease.  Outpatient Encounter Medications as of 05/10/2019  Medication Sig  . acetaminophen (TYLENOL) 325 MG tablet Take 2 tablets (650 mg total) by mouth every 6 (six) hours as needed. (Patient taking differently: Take 650 mg by mouth every 6 (six) hours as needed for mild pain, fever or headache. )  . ADVAIR DISKUS 500-50 MCG/DOSE AEPB INHALE 1 PUFF INTO THE LUNGS TWICE DAILY  . amLODipine (NORVASC) 5 MG tablet Take 5 mg by mouth every other day.   . calcium-vitamin D (OSCAL WITH D) 500-200 MG-UNIT tablet Take 1  tablet by mouth.  . Cholecalciferol (VITAMIN D PO) Take by mouth daily. 1 tab everyday  . losartan (COZAAR) 50 MG tablet Take 50 mg by mouth daily.  . Multiple Vitamins-Minerals (MULTIVITAMIN WITH MINERALS) tablet Take 1 tablet by mouth daily.  Marland Kitchen NOVOFINE PLUS 32G X 4 MM MISC U UTD WITH TRESIBA  . ONETOUCH VERIO test strip USE TO SELF MONITOR BLOOD GLUCOSE 2 TIMES DAILY AND AS NEEDED  . pantoprazole (PROTONIX) 40 MG tablet Take 1 tablet (40 mg total) by mouth 2 (two) times daily. (Patient taking differently: Take 40 mg by mouth daily. )  . traMADol (ULTRAM) 50 MG tablet Take 1/2-1 tablet three times daily as needed  . TRESIBA FLEXTOUCH 100 UNIT/ML SOPN FlexTouch Pen   . triamcinolone acetonide (TRIESENCE) 40 MG/ML SUSP Inject into the articular space.   No facility-administered encounter medications on file as of 05/10/2019.    Physical Exam: Blood pressure 118/66, pulse 79, temperature 97.9 F (36.6 C), temperature source Oral, height 5\' 2"  (1.575 m), weight 112 lb 12.8 oz (51.2 kg), SpO2 97 %. Gen:      No acute distress HEENT:  EOMI, sclera anicteric Neck:     No masses; no thyromegaly Lungs:    Clear to auscultation bilaterally; normal respiratory effort CV:         Regular rate and rhythm; no murmurs Abd:      + bowel sounds; soft, non-tender; no palpable masses, no distension Ext:    No edema; adequate peripheral perfusion Skin:      Warm and dry; no rash  Neuro: alert and oriented x 3 Psych: normal mood and affect  Data Reviewed: Imaging: CT chest 12/31/2006- peripheral reticulation with traction bronchiectasis, mild honeycombing at the bases  High-resolution CT chest 01/25/2019 marked.  Progression of reticulation, traction bronchiectasis.  Significant progression of honeycombing.  6 mm lingular nodule UIP fibrosis pattern. I have reviewed the images personally.  PFTs: [birth year]19[birth year] FVC 2.13 [93%], FEV1 1.75 [115%],/F 82, TLC 3.50 [81%], DLCO 15.16 [58%] Moderate diffusion  defect.  03/20/2014 FVC 1.92 [89%), FEV1 1.67 [106%),F/F 87, TLC 3.31 (69%), DLCO 9.36 (43%)  Mild restriction, severe diffusion defect  Labs: CTD serologies 02/24/2012-ANA positive, rheumatoid factor 19  Assessment:  Pulmonary fibrosis CT reviewed with progressive fibrosis and UIP pattern.  There are some features on CT scan such as exuberant honeycombing and honeycombing in the anterior aspect of the upper lobes which may suggest underlying connective tissue disease.   [facility](Chung JH et al. Octavia BrucknerAJR Am J Roentgenol. 2018 Feb;210(2):307-313. doi: 10.2214/AJR.17.18384)  Noted CTD serologies in 2013 with positive ANA.  She did not desat on exertion in office today.  Overall I suspect this could be IPF given progression.  We can evaluate for connective tissue disease Had an extensive discussion with patient and her daughter in office today and reviewed options including reevaluation for connective tissue disease, possibility of trying anti-fibrotic therapy.  Given her advanced age, frailty and slow progression of disease we have decided to hold off.  Functionally she is doing well and I feel aggressive evaluation and treatment with either immunosuppressives or anti-fibrotic therapy is not optimal given potential side effects.  Mrs. Janee Mornhompson and her daughter agree and feel that less is better for her and are not interested in trying new medications.  I have asked them to follow back with Dr. Tonia BroomsIcard.  I will be available as needed in case there is any change in plan  GERD Continue PPI  More then 1/2 the time of the 40 min visit was spent in counseling and/or coordination of care with the patient and family.  Plan/Recommendations: - Supportive care - Continue PPI Follow-up with Dr. Alvan DameIcard  Gerri Acre MD [facility] 05/10/2019, 11:57 AM  CC: Tisovec, Adelfa Kohichard W, MD

## 2019-05-26 DIAGNOSIS — E1165 Type 2 diabetes mellitus with hyperglycemia: Secondary | ICD-10-CM | POA: Diagnosis not present

## 2019-07-19 ENCOUNTER — Ambulatory Visit (INDEPENDENT_AMBULATORY_CARE_PROVIDER_SITE_OTHER): Payer: Medicare Other | Admitting: Podiatry

## 2019-07-19 ENCOUNTER — Other Ambulatory Visit: Payer: Self-pay

## 2019-07-19 ENCOUNTER — Encounter: Payer: Self-pay | Admitting: Podiatry

## 2019-07-19 DIAGNOSIS — E1142 Type 2 diabetes mellitus with diabetic polyneuropathy: Secondary | ICD-10-CM | POA: Diagnosis not present

## 2019-07-19 DIAGNOSIS — M79676 Pain in unspecified toe(s): Secondary | ICD-10-CM

## 2019-07-19 DIAGNOSIS — Z0120 Encounter for dental examination and cleaning without abnormal findings: Secondary | ICD-10-CM | POA: Diagnosis not present

## 2019-07-19 DIAGNOSIS — B351 Tinea unguium: Secondary | ICD-10-CM

## 2019-07-19 NOTE — Progress Notes (Signed)
Patient ID: Lauren Avery, female   DOB: 02/02/1927, 85 y.o.   MRN: [id number] Complaint:  Visit Type: Patient returns to my office for continued preventative foot care services. Complaint: Patient states" my nails have grown long and thick and become painful to walk and wear shoes" Patient has been diagnosed with DM with no foot complications. The patient presents for preventative foot care services. No changes to ROS  Podiatric Exam: Vascular: dorsalis pedis and posterior tibial pulses are palpable bilateral. Capillary return is immediate. Temperature gradient is WNL. Skin turgor WNL  Sensorium: Diminished  Semmes Weinstein monofilament test. Normal tactile sensation bilaterally. Nail Exam: Pt has thick disfigured discolored nails with subungual debris noted bilateral entire nail hallux through fifth toenails Ulcer Exam: There is no evidence of ulcer or pre-ulcerative changes or infection. Orthopedic Exam: Muscle tone and strength are WNL. No limitations in general ROM. No crepitus or effusions noted. Foot type and digits show no abnormalities. HAV  B/L. Skin: No Porokeratosis. No infection or ulcers  Diagnosis:  Onychomycosis, , Pain in right toe, pain in left toes  Treatment & Plan Procedures and Treatment: Consent by patient was obtained for treatment procedures. The patient understood the discussion of treatment and procedures well. All questions were answered thoroughly reviewed. Debridement of mycotic and hypertrophic toenails, 1 through 5 bilateral and clearing of subungual debris. No ulceration, no infection noted.  Return Visit-Office Procedure: Patient instructed to return to the office for a follow up visit 10 weeks  for continued evaluation and treatment.    Lynzee Lindquist DPM 

## 2019-08-11 DIAGNOSIS — E1129 Type 2 diabetes mellitus with other diabetic kidney complication: Secondary | ICD-10-CM | POA: Diagnosis not present

## 2019-08-11 DIAGNOSIS — Z794 Long term (current) use of insulin: Secondary | ICD-10-CM | POA: Diagnosis not present

## 2019-08-11 DIAGNOSIS — I10 Essential (primary) hypertension: Secondary | ICD-10-CM | POA: Diagnosis not present

## 2019-08-24 DIAGNOSIS — E1165 Type 2 diabetes mellitus with hyperglycemia: Secondary | ICD-10-CM | POA: Diagnosis not present

## 2019-08-26 ENCOUNTER — Telehealth: Payer: Self-pay | Admitting: Pulmonary Disease

## 2019-08-26 NOTE — Telephone Encounter (Signed)
Called and spoke to patient.  Let her know that since her brand name Advair is on back order that the provider did approve the generic and pharmacy is working on getting that filled for her.  Patient verbalized understanding.  Nothing further needed at this time.

## 2019-08-26 NOTE — Telephone Encounter (Signed)
Yes that is fine

## 2019-08-26 NOTE — Telephone Encounter (Signed)
Called and spoke to pharmacy.  Let them know that provider approved for the generic.  Nothing further needed at this time.

## 2019-08-26 NOTE — Telephone Encounter (Signed)
Called and spoke to pharmacist.  Advair is on back order until end of October.  They want to know if they can substitute the generic Wixela.  Please advise. Thanks!

## 2019-09-09 DIAGNOSIS — R82998 Other abnormal findings in urine: Secondary | ICD-10-CM | POA: Diagnosis not present

## 2019-09-09 DIAGNOSIS — M810 Age-related osteoporosis without current pathological fracture: Secondary | ICD-10-CM | POA: Diagnosis not present

## 2019-09-09 DIAGNOSIS — I10 Essential (primary) hypertension: Secondary | ICD-10-CM | POA: Diagnosis not present

## 2019-09-09 DIAGNOSIS — E1165 Type 2 diabetes mellitus with hyperglycemia: Secondary | ICD-10-CM | POA: Diagnosis not present

## 2019-09-12 DIAGNOSIS — Z1212 Encounter for screening for malignant neoplasm of rectum: Secondary | ICD-10-CM | POA: Diagnosis not present

## 2019-09-16 DIAGNOSIS — Z0000 Encounter for general adult medical examination without abnormal findings: Secondary | ICD-10-CM | POA: Diagnosis not present

## 2019-09-16 DIAGNOSIS — E1142 Type 2 diabetes mellitus with diabetic polyneuropathy: Secondary | ICD-10-CM | POA: Diagnosis not present

## 2019-09-16 DIAGNOSIS — E1129 Type 2 diabetes mellitus with other diabetic kidney complication: Secondary | ICD-10-CM | POA: Diagnosis not present

## 2019-09-16 DIAGNOSIS — E1165 Type 2 diabetes mellitus with hyperglycemia: Secondary | ICD-10-CM | POA: Diagnosis not present

## 2019-09-27 ENCOUNTER — Ambulatory Visit: Payer: Medicare Other | Admitting: Podiatry

## 2019-09-27 ENCOUNTER — Ambulatory Visit (INDEPENDENT_AMBULATORY_CARE_PROVIDER_SITE_OTHER): Payer: Medicare Other | Admitting: Podiatry

## 2019-09-27 ENCOUNTER — Other Ambulatory Visit: Payer: Self-pay

## 2019-09-27 ENCOUNTER — Encounter: Payer: Self-pay | Admitting: Podiatry

## 2019-09-27 DIAGNOSIS — M79676 Pain in unspecified toe(s): Secondary | ICD-10-CM | POA: Diagnosis not present

## 2019-09-27 DIAGNOSIS — B351 Tinea unguium: Secondary | ICD-10-CM | POA: Diagnosis not present

## 2019-09-27 DIAGNOSIS — E1142 Type 2 diabetes mellitus with diabetic polyneuropathy: Secondary | ICD-10-CM

## 2019-09-27 NOTE — Progress Notes (Signed)
Patient ID: Lauren Avery, female   DOB: 02/28/1927, 85 y.o.   MRN: [id number] Complaint:  Visit Type: Patient returns to my office for continued preventative foot care services. Complaint: Patient states" my nails have grown long and thick and become painful to walk and wear shoes" Patient has been diagnosed with DM with no foot complications. The patient presents for preventative foot care services. No changes to ROS  Podiatric Exam: Vascular: dorsalis pedis and posterior tibial pulses are palpable bilateral. Capillary return is immediate. Temperature gradient is WNL. Skin turgor WNL  Sensorium: Diminished  Semmes Weinstein monofilament test. Normal tactile sensation bilaterally. Nail Exam: Pt has thick disfigured discolored nails with subungual debris noted bilateral entire nail hallux through fifth toenails Ulcer Exam: There is no evidence of ulcer or pre-ulcerative changes or infection. Orthopedic Exam: Muscle tone and strength are WNL. No limitations in general ROM. No crepitus or effusions noted. Foot type and digits show no abnormalities. HAV  B/L. Skin: No Porokeratosis. No infection or ulcers  Diagnosis:  Onychomycosis, , Pain in right toe, pain in left toes  Treatment & Plan Procedures and Treatment: Consent by patient was obtained for treatment procedures. The patient understood the discussion of treatment and procedures well. All questions were answered thoroughly reviewed. Debridement of mycotic and hypertrophic toenails, 1 through 5 bilateral and clearing of subungual debris. No ulceration, no infection noted.  Return Visit-Office Procedure: Patient instructed to return to the office for a follow up visit 10 weeks  for continued evaluation and treatment.    Cathie Bonnell DPM 

## 2019-10-25 ENCOUNTER — Ambulatory Visit: Payer: Medicare Other | Admitting: Podiatry

## 2019-11-02 ENCOUNTER — Telehealth: Payer: Self-pay | Admitting: Pulmonary Disease

## 2019-11-02 MED ORDER — ADVAIR DISKUS 500-50 MCG/DOSE IN AEPB: INHALATION_SPRAY | RESPIRATORY_TRACT | 2 refills | Status: DC

## 2019-11-02 NOTE — Telephone Encounter (Signed)
Called and spoke with pt. Pt was needing Rx of advair to be sent to pharmacy. I verified pt's preferred pharmacy and sent Rx in for her.nothing further needed.

## 2019-11-04 ENCOUNTER — Other Ambulatory Visit: Payer: Self-pay

## 2019-11-04 MED ORDER — ADVAIR DISKUS 500-50 MCG/DOSE IN AEPB: INHALATION_SPRAY | RESPIRATORY_TRACT | 5 refills | Status: DC

## 2019-11-09 DIAGNOSIS — H5213 Myopia, bilateral: Secondary | ICD-10-CM | POA: Diagnosis not present

## 2019-11-09 DIAGNOSIS — E119 Type 2 diabetes mellitus without complications: Secondary | ICD-10-CM | POA: Diagnosis not present

## 2019-11-09 DIAGNOSIS — H5211 Myopia, right eye: Secondary | ICD-10-CM | POA: Diagnosis not present

## 2019-11-09 DIAGNOSIS — N1831 Chronic kidney disease, stage 3a: Secondary | ICD-10-CM | POA: Diagnosis not present

## 2019-11-09 DIAGNOSIS — I129 Hypertensive chronic kidney disease with stage 1 through stage 4 chronic kidney disease, or unspecified chronic kidney disease: Secondary | ICD-10-CM | POA: Diagnosis not present

## 2019-11-09 DIAGNOSIS — E1129 Type 2 diabetes mellitus with other diabetic kidney complication: Secondary | ICD-10-CM | POA: Diagnosis not present

## 2019-11-09 DIAGNOSIS — H25099 Other age-related incipient cataract, unspecified eye: Secondary | ICD-10-CM | POA: Diagnosis not present

## 2019-11-09 DIAGNOSIS — I10 Essential (primary) hypertension: Secondary | ICD-10-CM | POA: Diagnosis not present

## 2019-11-09 DIAGNOSIS — Z794 Long term (current) use of insulin: Secondary | ICD-10-CM | POA: Diagnosis not present

## 2019-11-16 NOTE — Patient Instructions (Signed)
Thank you for visiting Dr. Mackayla Mullins at [facility]. Today we recommend the following: No orders of the defined types were placed in this encounter.  No orders of the defined types were placed in this encounter.  No follow-ups on file.    Please do your part to reduce the spread of COVID-19.    

## 2019-11-16 NOTE — Progress Notes (Signed)
Synopsis: Referred in Feb 2020 to est care, prior pulmonary was Dr. Kriste BasqueNadel.  Subjective:   PATIENT ID: Lauren Avery GENDER: female DOB: 30-Oct-1927, MRN: [id number]  Chief Complaint  Patient presents with  . 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 Currently does "nothing" she states.  She is around her house most of the day.  Her family comes and checks on her routinely.  She is able to live alone and care for herself.  She is able to complete all of her activities of daily living.  Her family does help her with grocery shopping.   She has been followed since 2008 for pulmonary fibrosis.  She has prior CT imaging that was completed in 2008 which were reviewed today in the office.  She has had subsequent chest x-rays for follow-up of her pulmonary fibrosis.  There has been no clear exposure or clear etiology that has been defined throughout the years.  However seems to be rather dormant and on progressive.  She does complain of ongoing persistent daily dry cough.  This is her only respiratory complaint today.  Cough is nonproductive in nature and denies hemoptysis.She does however have daily heartburn symptoms and indigestion.  OV 11/17/2019: Virtual Visit via Telephone Note  I connected withMargaret L Avery on 11/16/19 at  9:45 AM EST by telephoneand verified that I am speaking with the correct person using two identifiers.  Location: Patient: Lauren Avery Provider: Josephine IgoBradley L Latrise Bowland, DO   I discussed the limitations, risks, security and privacy concerns of performing an evaluation and management service by telephone and the availability of in person appointments. I also discussed with the patient that there may be a patient responsible charge related to this service. The  patient expressed understanding and agreed to proceed.  History of Present Illness: 85 year old female diagnosed with pulmonary fibrosis, seen by Dr. Isaiah SergeMannam one of our [facility] clinic physicians.  Felt to be likely progression of IPF no significant exposures.  CT imaging consistent with UIP.  She did have positive ANA in the past.  Dr. Isaiah SergeMannam recommended supportive care at this time. Today, patient complains of ongoing cough and sputum production.  Denies hemoptysis.  Denies fevers chills night sweats.  She does have cough at night.  Patient still living alone.  Daughter present today on phone call.  Observations/Objective: Occasional cough in the background.  Somewhat labored breathing.  Speaks in few words.  Assessment and Plan: Idiopathic pulmonary fibrosis Advanced end-stage lung disease Cough associated with fibrosis Chronic cough Traction bronchiectasis  Plan: Start flutter valve, orders placed for DME Recommended outpatient palliative care. Daughters go to speak with other siblings.  She is going to let us know whether or not she would like a referral to outpatient palliative. Orders placed for Hycodan cough syrup. Patient counseled on side effects of opiate cough syrup  Follow Up Instructions: Return to clinic in 3 months or as needed Okay to place palliative consult referral to [facility] if Daughter calls back requesting   I discussed the assessment and treatment plan with the patient. The patient was provided an opportunity to ask questions and all were answered. The patient agreed with the plan and demonstrated an understanding of the instructions.  The patient was advised to call back or seek an  in-person evaluation if the symptoms worsen or if the condition fails to improve as anticipated.  I provided 18 minutes of non-face-to-face time during this encounter.   Josephine Igo, DO        Past Medical History:  Diagnosis Date  . Diabetes mellitus   . Fracture  10/2012   left wrist  . Fracture of left clavicle    12/12 wore a sling  . GERD (gastroesophageal reflux disease)   . Hypertension   . Osteoporosis   . Pulmonary fibrosis (HCC)      Family History  Problem Relation Age of Onset  . Pancreatic cancer Brother   . Stroke Mother   . Diabetes Father      Past Surgical History:  Procedure Laterality Date  . FEMUR FRACTURE SURGERY      6/08 partial hip replacement right  . ganglion cyst removal     left wrist 1960  . HIP FRACTURE SURGERY     1984 pin and plate placed right  . HIP SURGERY     pin and plate removed 05/5169  . ORIF ELBOW FRACTURE Right 02/18/2013   Procedure: OPEN REDUCTION INTERNAL FIXATION (ORIF) ELBOW/OLECRANON FRACTURE;  Surgeon: Nadara Mustard, MD;  Location: [facility] OR;  Service: Orthopedics;  Laterality: Right;  . ORIF ELBOW FRACTURE Left 05/09/2015   Procedure: OPEN REDUCTION INTERNAL FIXATION (ORIF) LEFT MEDIAL HUMERAL CONDYLE FRACTURE;  Surgeon: Tarry Kos, MD;  Location: [facility] OR;  Service: Orthopedics;  Laterality: Left;    Social History   Socioeconomic History  . Marital status: Widowed    Spouse name: Not on file  . Number of children: Y  . Years of education: Not on file  . Highest education level: Not on file  Occupational History  . Occupation: retired Airline pilot  Tobacco Use  . Smoking status: Never Smoker  . Smokeless tobacco: Never Used  Substance and Sexual Activity  . Alcohol use: No  . Drug use: No  . Sexual activity: Not on file  Other Topics Concern  . Not on file  Social History Narrative  . Not on file   Social Determinants of Health   Financial Resource Strain:   . Difficulty of Paying Living Expenses: Not on file  Food Insecurity:   . Worried About Programme researcher, broadcasting/film/video in the Last Year: Not on file  . Ran Out of Food in the Last Year: Not on file  Transportation Needs:   . Lack of Transportation (Medical): Not on file  . Lack of Transportation (Non-Medical): Not on file    Physical Activity:   . Days of Exercise per Week: Not on file  . Minutes of Exercise per Session: Not on file  Stress:   . Feeling of Stress : Not on file  Social Connections:   . Frequency of Communication with Friends and Family: Not on file  . Frequency of Social Gatherings with Friends and Family: Not on file  . Attends Religious Services: Not on file  . Active Member of Clubs or Organizations: Not on file  . Attends Banker Meetings: Not on file  . Marital Status: Not on file  Intimate Partner Violence:   . Fear of Current or Ex-Partner: Not on file  . Emotionally Abused: Not on file  . Physically Abused: Not on file  . Sexually Abused: Not on file     Allergies  Allergen Reactions  . Hydrochlorothiazide Other (See Comments) and Itching    Taken from faxed  notes of dr Wylene Simmer (to short stay)  THROMBOCYTOPENIA Taken from faxed notes of dr Wylene Simmer (to short stay)  THROMBOCYTOPENIA  . Prilosec [Omeprazole] Diarrhea     Outpatient Medications Prior to Visit  Medication Sig Dispense Refill  . acetaminophen (TYLENOL) 325 MG tablet Take 2 tablets (650 mg total) by mouth every 6 (six) hours as needed. (Patient taking differently: Take 650 mg by mouth every 6 (six) hours as needed for mild pain, fever or headache. )    . ADVAIR DISKUS 500-50 MCG/DOSE AEPB INHALE 1 PUFF INTO THE LUNGS TWICE DAILY 60 each 5  . amLODipine (NORVASC) 5 MG tablet Take 5 mg by mouth every other day.     . calcium-vitamin D (OSCAL WITH D) 500-200 MG-UNIT tablet Take 1 tablet by mouth.    . Cholecalciferol (VITAMIN D PO) Take by mouth daily. 1 tab everyday    . losartan (COZAAR) 50 MG tablet Take 50 mg by mouth daily.    . Multiple Vitamins-Minerals (MULTIVITAMIN WITH MINERALS) tablet Take 1 tablet by mouth daily.    Marland Kitchen NOVOFINE PLUS 32G X 4 MM MISC U UTD WITH TRESIBA  5  . ONETOUCH VERIO test strip USE TO SELF MONITOR BLOOD GLUCOSE 2 TIMES DAILY AND AS NEEDED  6  . pantoprazole (PROTONIX) 40  MG tablet Take 1 tablet (40 mg total) by mouth 2 (two) times daily. (Patient taking differently: Take 40 mg by mouth daily. ) 60 tablet 6  . traMADol (ULTRAM) 50 MG tablet Take 1/2-1 tablet three times daily as needed 90 tablet 2  . TRESIBA FLEXTOUCH 100 UNIT/ML SOPN FlexTouch Pen     . triamcinolone acetonide (TRIESENCE) 40 MG/ML SUSP Inject into the articular space.    Marland Kitchen UNABLE TO FIND USE TO SELF MONITOR BLOOD GLUCOSE 2 TIMES DAILY AND AS NEEDED     No facility-administered medications prior to visit.    ROS   Objective:  Physical Exam   There were no vitals filed for this visit.   on RA BMI Readings from Last 3 Encounters:  05/10/19 20.63 kg/m  05/09/19 20.45 kg/m  01/18/19 20.45 kg/m   Wt Readings from Last 3 Encounters:  05/10/19 112 lb 12.8 oz (51.2 kg)  05/09/19 111 lb 12.8 oz (50.7 kg)  01/18/19 111 lb 12.8 oz (50.7 kg)     CBC    Component Value Date/Time   WBC 8.7 05/16/2015 0000   WBC 7.9 05/10/2015 0435   RBC 3.70 (L) 05/10/2015 0435   HGB 12.3 05/16/2015 0000   HCT 36 05/16/2015 0000   PLT 180 05/16/2015 0000   MCV 89.2 05/10/2015 0435   MCH 28.9 05/10/2015 0435   MCHC 32.4 05/10/2015 0435   RDW 13.9 05/10/2015 0435   LYMPHSABS 2.3 05/10/2015 0435   MONOABS 0.7 05/10/2015 0435   EOSABS 0.1 05/10/2015 0435   BASOSABS 0.0 05/10/2015 0435    Chest Imaging: CT chest 2008 Reviewed imaging today in the office, bilateral interstitial fibrosis with peripheral honeycombing.  No prior follow-up. The patient's images have been independently reviewed by me.    HRCT scan of the chest 01/25/2019: Consistent with UIP. The patient's images have been independently reviewed by me.    Pulmonary Functions Testing Results: PFT Results Latest Ref Rng & Units 03/16/2014  FVC-Pre L 1.88  FVC-Predicted Pre % 88  FVC-Post L 1.92  FVC-Predicted Post % 89  Pre FEV1/FVC % % 85  Post FEV1/FCV % % 87  FEV1-Pre L 1.59  FEV1-Predicted Pre %  101  FEV1-Post L 1.67   DLCO UNC% % 43  DLCO COR %Predicted % 71  TLC L 3.31  TLC % Predicted % 69  RV % Predicted % 61     FeNO: None   Pathology: None  Echocardiogram: None   Heart Catheterization: None     Assessment & Plan:   No diagnosis found.    Current Outpatient Medications:  .  acetaminophen (TYLENOL) 325 MG tablet, Take 2 tablets (650 mg total) by mouth every 6 (six) hours as needed. (Patient taking differently: Take 650 mg by mouth every 6 (six) hours as needed for mild pain, fever or headache. ), Disp: , Rfl:  .  ADVAIR DISKUS 500-50 MCG/DOSE AEPB, INHALE 1 PUFF INTO THE LUNGS TWICE DAILY, Disp: 60 each, Rfl: 5 .  amLODipine (NORVASC) 5 MG tablet, Take 5 mg by mouth every other day. , Disp: , Rfl:  .  calcium-vitamin D (OSCAL WITH D) 500-200 MG-UNIT tablet, Take 1 tablet by mouth., Disp: , Rfl:  .  Cholecalciferol (VITAMIN D PO), Take by mouth daily. 1 tab everyday, Disp: , Rfl:  .  losartan (COZAAR) 50 MG tablet, Take 50 mg by mouth daily., Disp: , Rfl:  .  Multiple Vitamins-Minerals (MULTIVITAMIN WITH MINERALS) tablet, Take 1 tablet by mouth daily., Disp: , Rfl:  .  NOVOFINE PLUS 32G X 4 MM MISC, U UTD WITH TRESIBA, Disp: , Rfl: 5 .  ONETOUCH VERIO test strip, USE TO SELF MONITOR BLOOD GLUCOSE 2 TIMES DAILY AND AS NEEDED, Disp: , Rfl: 6 .  pantoprazole (PROTONIX) 40 MG tablet, Take 1 tablet (40 mg total) by mouth 2 (two) times daily. (Patient taking differently: Take 40 mg by mouth daily. ), Disp: 60 tablet, Rfl: 6 .  traMADol (ULTRAM) 50 MG tablet, Take 1/2-1 tablet three times daily as needed, Disp: 90 tablet, Rfl: 2 .  TRESIBA FLEXTOUCH 100 UNIT/ML SOPN FlexTouch Pen, , Disp: , Rfl:  .  triamcinolone acetonide (TRIESENCE) 40 MG/ML SUSP, Inject into the articular space., Disp: , Rfl:  .  UNABLE TO FIND, USE TO SELF MONITOR BLOOD GLUCOSE 2 TIMES DAILY AND AS NEEDED, Disp: , Rfl:    Garner Nash, DO [facility] 11/17/2019 9:24 AM

## 2019-11-17 ENCOUNTER — Ambulatory Visit: Payer: Medicare Other | Admitting: Pulmonary Disease

## 2019-11-17 ENCOUNTER — Other Ambulatory Visit: Payer: Self-pay

## 2019-11-17 ENCOUNTER — Ambulatory Visit (INDEPENDENT_AMBULATORY_CARE_PROVIDER_SITE_OTHER): Payer: Medicare Other | Admitting: Pulmonary Disease

## 2019-11-17 DIAGNOSIS — J984 Other disorders of lung: Secondary | ICD-10-CM

## 2019-11-17 DIAGNOSIS — R05 Cough: Secondary | ICD-10-CM | POA: Diagnosis not present

## 2019-11-17 DIAGNOSIS — J479 Bronchiectasis, uncomplicated: Secondary | ICD-10-CM | POA: Diagnosis not present

## 2019-11-17 DIAGNOSIS — J84112 Idiopathic pulmonary fibrosis: Secondary | ICD-10-CM

## 2019-11-17 DIAGNOSIS — R053 Chronic cough: Secondary | ICD-10-CM

## 2019-11-17 DIAGNOSIS — K219 Gastro-esophageal reflux disease without esophagitis: Secondary | ICD-10-CM

## 2019-11-17 MED ORDER — HYDROCODONE-HOMATROPINE 5-1.5 MG/5ML PO SYRP: 5.0000 mL | ORAL_SOLUTION | Freq: Four times a day (QID) | ORAL | 0 refills | Status: DC | PRN

## 2019-12-05 DIAGNOSIS — R05 Cough: Secondary | ICD-10-CM | POA: Diagnosis not present

## 2019-12-05 DIAGNOSIS — J84112 Idiopathic pulmonary fibrosis: Secondary | ICD-10-CM | POA: Diagnosis not present

## 2019-12-07 ENCOUNTER — Ambulatory Visit (INDEPENDENT_AMBULATORY_CARE_PROVIDER_SITE_OTHER): Payer: Medicare Other | Admitting: Podiatry

## 2019-12-07 ENCOUNTER — Encounter: Payer: Self-pay | Admitting: Podiatry

## 2019-12-07 ENCOUNTER — Other Ambulatory Visit: Payer: Self-pay

## 2019-12-07 DIAGNOSIS — B351 Tinea unguium: Secondary | ICD-10-CM

## 2019-12-07 DIAGNOSIS — M79676 Pain in unspecified toe(s): Secondary | ICD-10-CM | POA: Diagnosis not present

## 2019-12-07 DIAGNOSIS — E1142 Type 2 diabetes mellitus with diabetic polyneuropathy: Secondary | ICD-10-CM | POA: Diagnosis not present

## 2019-12-07 NOTE — Progress Notes (Signed)
Patient ID: Lauren Avery, female   DOB: September 07, 1927, 86 y.o.   MRN: [id number] Complaint:  Visit Type: Patient returns to my office for continued preventative foot care services. Complaint: Patient states" my nails have grown long and thick and become painful to walk and wear shoes" Patient has been diagnosed with DM with no foot complications. The patient presents for preventative foot care services. No changes to ROS  Podiatric Exam: Vascular: dorsalis pedis and posterior tibial pulses are palpable bilateral. Capillary return is immediate. Temperature gradient is WNL. Skin turgor WNL  Sensorium: Diminished  Semmes Weinstein monofilament test. Normal tactile sensation bilaterally. Nail Exam: Pt has thick disfigured discolored nails with subungual debris noted bilateral entire nail hallux through fifth toenails Ulcer Exam: There is no evidence of ulcer or pre-ulcerative changes or infection. Orthopedic Exam: Muscle tone and strength are WNL. No limitations in general ROM. No crepitus or effusions noted. Foot type and digits show no abnormalities. HAV  B/L. Skin: No Porokeratosis. No infection or ulcers  Diagnosis:  Onychomycosis, , Pain in right toe, pain in left toes  Treatment & Plan Procedures and Treatment: Consent by patient was obtained for treatment procedures. The patient understood the discussion of treatment and procedures well. All questions were answered thoroughly reviewed. Debridement of mycotic and hypertrophic toenails, 1 through 5 bilateral and clearing of subungual debris. No ulceration, no infection noted.  Return Visit-Office Procedure: Patient instructed to return to the office for a follow up visit 10 weeks  for continued evaluation and treatment.    Helane Gunther DPM

## 2019-12-30 ENCOUNTER — Ambulatory Visit: Payer: Self-pay

## 2020-01-07 ENCOUNTER — Ambulatory Visit: Payer: Medicare Other | Attending: Internal Medicine

## 2020-01-07 DIAGNOSIS — Z23 Encounter for immunization: Secondary | ICD-10-CM

## 2020-01-07 NOTE — Progress Notes (Signed)
   Covid-19 Vaccination Clinic  Name:  CADANCE RAUS    MRN: [id number] DOB: 1927-09-09  01/07/2020  Ms. Elsbury was observed post Covid-19 immunization for 15 minutes without incidence. She was provided with Vaccine Information Sheet and instruction to access the V-Safe system.   Ms. Manfredi was instructed to call 911 with any severe reactions post vaccine: Marland Kitchen Difficulty breathing  . Swelling of your face and throat  . A fast heartbeat  . A bad rash all over your body  . Dizziness and weakness    Immunizations Administered    Name Date Dose VIS Date Route   Pfizer COVID-19 Vaccine 01/07/2020  4:48 PM 0.3 mL 11/11/2019 Intramuscular   Manufacturer: [employer], [employer]   Lot: [id number]   NDC: [address]-7

## 2020-01-08 ENCOUNTER — Ambulatory Visit: Payer: Self-pay | Attending: Internal Medicine

## 2020-02-01 ENCOUNTER — Ambulatory Visit: Payer: Medicare Other | Attending: Internal Medicine

## 2020-02-01 DIAGNOSIS — Z23 Encounter for immunization: Secondary | ICD-10-CM | POA: Insufficient documentation

## 2020-02-01 NOTE — Progress Notes (Signed)
   Covid-19 Vaccination Clinic  Name:  Lauren Avery    MRN: [id number] DOB: [birth year]  02/01/2020  [name] was observed post Covid-19 immunization for 15 minutes without incident. She was provided with Vaccine Information Sheet and instruction to access the V-Safe system.   [name] was instructed to call 911 with any severe reactions post vaccine: Marland Kitchen Difficulty breathing  . Swelling of face and throat  . A fast heartbeat  . A bad rash all over body  . Dizziness and weakness   Immunizations Administered    Name Date Dose VIS Date Route   Pfizer COVID-19 Vaccine 02/01/2020  1:35 PM 0.3 mL 11/11/2019 Intramuscular   Manufacturer: [employer], [employer]   Lot: [id number]   NDC: [address]-1

## 2020-02-14 DIAGNOSIS — N1831 Chronic kidney disease, stage 3a: Secondary | ICD-10-CM | POA: Diagnosis not present

## 2020-02-14 DIAGNOSIS — I129 Hypertensive chronic kidney disease with stage 1 through stage 4 chronic kidney disease, or unspecified chronic kidney disease: Secondary | ICD-10-CM | POA: Diagnosis not present

## 2020-02-14 DIAGNOSIS — E1129 Type 2 diabetes mellitus with other diabetic kidney complication: Secondary | ICD-10-CM | POA: Diagnosis not present

## 2020-02-14 DIAGNOSIS — Z794 Long term (current) use of insulin: Secondary | ICD-10-CM | POA: Diagnosis not present

## 2020-02-22 ENCOUNTER — Other Ambulatory Visit: Payer: Self-pay

## 2020-02-22 ENCOUNTER — Ambulatory Visit (INDEPENDENT_AMBULATORY_CARE_PROVIDER_SITE_OTHER): Payer: Medicare Other | Admitting: Podiatry

## 2020-02-22 ENCOUNTER — Encounter: Payer: Self-pay | Admitting: Podiatry

## 2020-02-22 VITALS — Temp 97.2°F

## 2020-02-22 DIAGNOSIS — B351 Tinea unguium: Secondary | ICD-10-CM | POA: Diagnosis not present

## 2020-02-22 DIAGNOSIS — E1142 Type 2 diabetes mellitus with diabetic polyneuropathy: Secondary | ICD-10-CM

## 2020-02-22 DIAGNOSIS — M79676 Pain in unspecified toe(s): Secondary | ICD-10-CM | POA: Diagnosis not present

## 2020-02-22 NOTE — Progress Notes (Signed)
This patient returns to my office for at risk foot care.  This patient requires this care by a professional since this patient will be at risk due to having diabetes and long term use of anticoagulants.   This patient is unable to cut nails himself since the patient cannot reach his nails.These nails are painful walking and wearing shoes.  This patient presents for at risk foot care today.  General Appearance  Alert, conversant and in no acute stress.  Vascular  Dorsalis pedis and posterior tibial  pulses are palpable  bilaterally.  Capillary return is within normal limits  bilaterally. Temperature is within normal limits  bilaterally.  Neurologic  Senn-Weinstein monofilament wire test diminished   bilaterally. Muscle power within normal limits bilaterally.  Nails Thick disfigured discolored nails with subungual debris  from hallux to fifth toes bilaterally. No evidence of bacterial infection or drainage bilaterally.  Orthopedic  No limitations of motion  feet .  No crepitus or effusions noted.  No bony pathology or digital deformities noted.  HAV  B/L.  Skin  normotropic skin with no porokeratosis noted bilaterally.  No signs of infections or ulcers noted.     Onychomycosis  Pain in right toes  Pain in left toes  Consent was obtained for treatment procedures.   Mechanical debridement of nails 1-5  bilaterally performed with a nail nipper.  Filed with dremel without incident. No infection or ulcer.     Return office visit   10 weeks                   Told patient to return for periodic foot care and evaluation due to potential at risk complications.   Helane Gunther DPM

## 2020-03-01 DIAGNOSIS — N390 Urinary tract infection, site not specified: Secondary | ICD-10-CM | POA: Diagnosis not present

## 2020-03-01 DIAGNOSIS — R531 Weakness: Secondary | ICD-10-CM | POA: Diagnosis not present

## 2020-03-01 DIAGNOSIS — N3000 Acute cystitis without hematuria: Secondary | ICD-10-CM | POA: Diagnosis not present

## 2020-03-01 DIAGNOSIS — M810 Age-related osteoporosis without current pathological fracture: Secondary | ICD-10-CM | POA: Diagnosis not present

## 2020-03-01 DIAGNOSIS — R300 Dysuria: Secondary | ICD-10-CM | POA: Diagnosis not present

## 2020-04-09 ENCOUNTER — Telehealth: Payer: Self-pay | Admitting: Pulmonary Disease

## 2020-04-09 NOTE — Telephone Encounter (Signed)
PCCM:  Okay to refill.  I attempted to do this through our electronic medical record however the narcotic control system would not let me put this through.  I am going to have to work with IT to help figure out why the prescription will not submit.  Will the pharmacy take a printed prescription?  Josephine Igo, DO [facility] 04/09/2020 4:17 PM

## 2020-04-09 NOTE — Telephone Encounter (Signed)
FYI

## 2020-04-09 NOTE — Telephone Encounter (Signed)
Called and spoke with pt who is requesting a refill of her hydrocodone cough syrup.  Dr. Tonia Brooms, please advise if you are okay refilling med for pt. You will have to refill this if you are fine with it being done due to it being a narcotic med.

## 2020-04-11 ENCOUNTER — Telehealth: Payer: Self-pay | Admitting: Pulmonary Disease

## 2020-04-11 MED ORDER — HYDROCODONE-HOMATROPINE 5-1.5 MG/5ML PO SYRP: 5.0000 mL | ORAL_SOLUTION | Freq: Four times a day (QID) | ORAL | 0 refills | Status: DC | PRN

## 2020-04-11 NOTE — Telephone Encounter (Signed)
Called and spoke with Clydie Braun. They were wanting an update in regards to pt receiving a refill of the hycodan cough syrup.  Dr. Tonia Brooms, please advise if you have been able to get it worked out with IT to have Rx sent to pharmacy, if not pharmacy said it was okay for rx to be printed and signed by you for pt/family to come by office to pick up and take to pharmacy.

## 2020-04-11 NOTE — Telephone Encounter (Signed)
Called and spoke with Clydie Braun letting her know that Rx has been able to be sent to pharmacy for pt by Dr. Tonia Brooms. Clydie Braun verbalized understanding. Nothing further needed.

## 2020-04-11 NOTE — Telephone Encounter (Signed)
PCCM:  Refill for Hycodan complete.  Josephine Igo, DO [facility] 04/11/2020 5:12 PM

## 2020-04-11 NOTE — Telephone Encounter (Signed)
Called pt's pharmacy and spoke with Diannia Ruder to see if they will take a printed Rx and she stated that we could print the Rx, signed by MD, and have pt or family bring it to the pharmacy.

## 2020-04-20 DIAGNOSIS — Z794 Long term (current) use of insulin: Secondary | ICD-10-CM | POA: Diagnosis not present

## 2020-04-20 DIAGNOSIS — I700 Atherosclerosis of aorta: Secondary | ICD-10-CM | POA: Diagnosis not present

## 2020-04-20 DIAGNOSIS — E1129 Type 2 diabetes mellitus with other diabetic kidney complication: Secondary | ICD-10-CM | POA: Diagnosis not present

## 2020-04-20 DIAGNOSIS — M545 Low back pain: Secondary | ICD-10-CM | POA: Diagnosis not present

## 2020-05-02 ENCOUNTER — Other Ambulatory Visit: Payer: Self-pay

## 2020-05-02 ENCOUNTER — Encounter: Payer: Self-pay | Admitting: Podiatry

## 2020-05-02 ENCOUNTER — Ambulatory Visit (INDEPENDENT_AMBULATORY_CARE_PROVIDER_SITE_OTHER): Payer: Medicare Other | Admitting: Podiatry

## 2020-05-02 DIAGNOSIS — M79676 Pain in unspecified toe(s): Secondary | ICD-10-CM

## 2020-05-02 DIAGNOSIS — B351 Tinea unguium: Secondary | ICD-10-CM | POA: Diagnosis not present

## 2020-05-02 DIAGNOSIS — D689 Coagulation defect, unspecified: Secondary | ICD-10-CM | POA: Diagnosis not present

## 2020-05-02 DIAGNOSIS — E1142 Type 2 diabetes mellitus with diabetic polyneuropathy: Secondary | ICD-10-CM | POA: Diagnosis not present

## 2020-05-02 NOTE — Progress Notes (Signed)
This patient returns to my office for at risk foot care.  This patient requires this care by a professional since this patient will be at risk due to having diabetes and long term use of anticoagulants.    This patient is unable to cut nails herself since the patient cannot reach her nails.These nails are painful walking and wearing shoes.  This patient presents for at risk foot care today.  General Appearance  Alert, conversant and in no acute stress.  Vascular  Dorsalis pedis and posterior tibial  pulses are palpable  bilaterally.  Capillary return is within normal limits  bilaterally. Temperature is within normal limits  bilaterally.  Neurologic  Senn-Weinstein monofilament wire test diminished   bilaterally. Muscle power within normal limits bilaterally.  Nails Thick disfigured discolored nails with subungual debris  from hallux to fifth toes bilaterally.  Friable hallux toenails  B/L.   No evidence of bacterial infection or drainage bilaterally.  Orthopedic  No limitations of motion  feet .  No crepitus or effusions noted.  No bony pathology or digital deformities noted.  HAV  B/L.  Skin  normotropic skin with no porokeratosis noted bilaterally.  No signs of infections or ulcers noted.     Onychomycosis  Pain in right toes  Pain in left toes  Consent was obtained for treatment procedures.   Mechanical debridement of nails 1-5  bilaterally performed with a nail nipper.  Filed with dremel without incident. No infection or ulcer.     Return office visit   10 weeks                   Told patient to return for periodic foot care and evaluation due to potential at risk complications.   Gregory Mayer DPM  

## 2020-05-17 DIAGNOSIS — Z794 Long term (current) use of insulin: Secondary | ICD-10-CM | POA: Diagnosis not present

## 2020-05-17 DIAGNOSIS — I129 Hypertensive chronic kidney disease with stage 1 through stage 4 chronic kidney disease, or unspecified chronic kidney disease: Secondary | ICD-10-CM | POA: Diagnosis not present

## 2020-05-17 DIAGNOSIS — E1129 Type 2 diabetes mellitus with other diabetic kidney complication: Secondary | ICD-10-CM | POA: Diagnosis not present

## 2020-05-17 DIAGNOSIS — N1831 Chronic kidney disease, stage 3a: Secondary | ICD-10-CM | POA: Diagnosis not present

## 2020-07-11 ENCOUNTER — Other Ambulatory Visit: Payer: Self-pay

## 2020-07-11 ENCOUNTER — Ambulatory Visit (INDEPENDENT_AMBULATORY_CARE_PROVIDER_SITE_OTHER): Payer: Medicare Other | Admitting: Podiatry

## 2020-07-11 ENCOUNTER — Encounter: Payer: Self-pay | Admitting: Podiatry

## 2020-07-11 DIAGNOSIS — E1142 Type 2 diabetes mellitus with diabetic polyneuropathy: Secondary | ICD-10-CM | POA: Diagnosis not present

## 2020-07-11 DIAGNOSIS — M79676 Pain in unspecified toe(s): Secondary | ICD-10-CM

## 2020-07-11 DIAGNOSIS — D689 Coagulation defect, unspecified: Secondary | ICD-10-CM

## 2020-07-11 DIAGNOSIS — B351 Tinea unguium: Secondary | ICD-10-CM | POA: Diagnosis not present

## 2020-07-11 NOTE — Progress Notes (Signed)
This patient returns to my office for at risk foot care.  This patient requires this care by a professional since this patient will be at risk due to having diabetes and long term use of anticoagulants.    This patient is unable to cut nails herself since the patient cannot reach her nails.These nails are painful walking and wearing shoes.  This patient presents for at risk foot care today.  General Appearance  Alert, conversant and in no acute stress.  Vascular  Dorsalis pedis and posterior tibial  pulses are palpable  bilaterally.  Capillary return is within normal limits  bilaterally. Temperature is within normal limits  bilaterally.  Neurologic  Senn-Weinstein monofilament wire test diminished   bilaterally. Muscle power within normal limits bilaterally.  Nails Thick disfigured discolored nails with subungual debris  from hallux to fifth toes bilaterally.  Friable hallux toenails  B/L.   No evidence of bacterial infection or drainage bilaterally.  Orthopedic  No limitations of motion  feet .  No crepitus or effusions noted.  No bony pathology or digital deformities noted.  HAV  B/L.  Skin  normotropic skin with no porokeratosis noted bilaterally.  No signs of infections or ulcers noted.     Onychomycosis  Pain in right toes  Pain in left toes  Consent was obtained for treatment procedures.   Mechanical debridement of nails 1-5  bilaterally performed with a nail nipper.  Filed with dremel without incident. No infection or ulcer.     Return office visit   10 weeks                   Told patient to return for periodic foot care and evaluation due to potential at risk complications.   Helane Gunther DPM

## 2020-08-15 DIAGNOSIS — Z794 Long term (current) use of insulin: Secondary | ICD-10-CM | POA: Diagnosis not present

## 2020-08-15 DIAGNOSIS — I129 Hypertensive chronic kidney disease with stage 1 through stage 4 chronic kidney disease, or unspecified chronic kidney disease: Secondary | ICD-10-CM | POA: Diagnosis not present

## 2020-08-15 DIAGNOSIS — N1831 Chronic kidney disease, stage 3a: Secondary | ICD-10-CM | POA: Diagnosis not present

## 2020-08-15 DIAGNOSIS — E1129 Type 2 diabetes mellitus with other diabetic kidney complication: Secondary | ICD-10-CM | POA: Diagnosis not present

## 2020-09-07 IMAGING — CT CT CHEST HIGH RESOLUTION W/O CM
1 of 3 series · 14 of 33 positions shown, 18 images · non-contrast
Comparison: 12/31/2006.

CLINICAL DATA: Pulmonary fibrosis, worsening cough, shortness of
breath.

EXAM:
CT CHEST WITHOUT CONTRAST
TECHNIQUE: Multidetector CT imaging of the chest was performed following the
standard protocol without intravenous contrast. High resolution
imaging of the lungs, as well as inspiratory and expiratory imaging,
was performed.

[Series 3: chest · axial · 0.59mm/px · z∈[-225,-1]mm · 14 of 124 slices shown, 18 images]
[im 6/124  mediastinal]
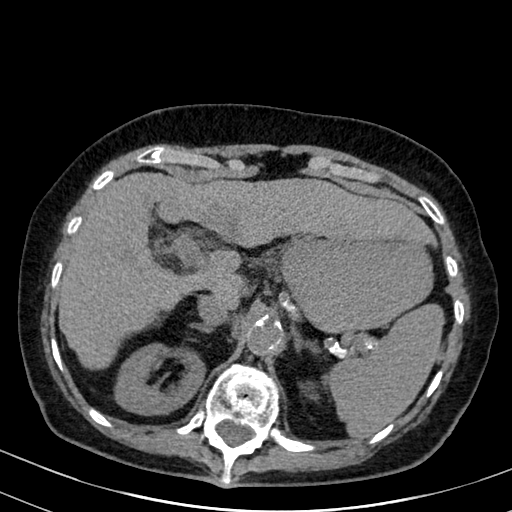
[im 6/124  lung]
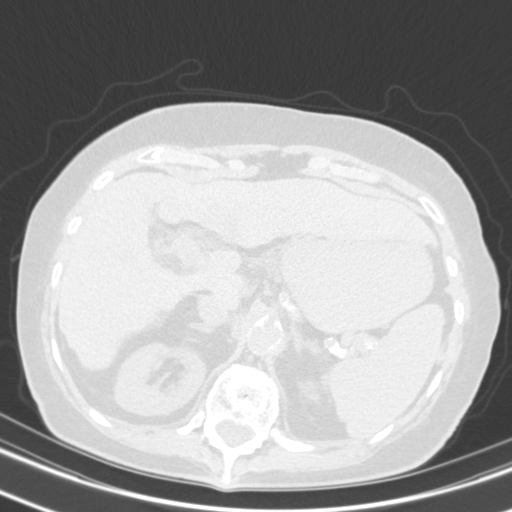
[im 17/124  lung]
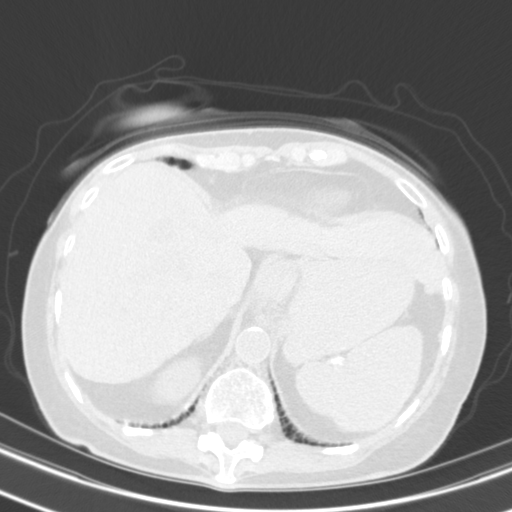
[im 27/124  lung]
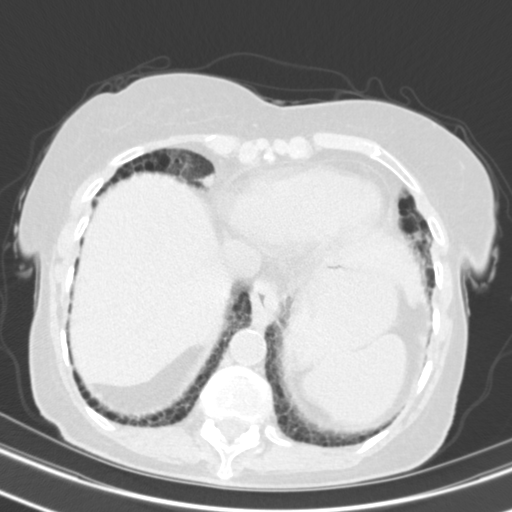
[im 38/124  lung]
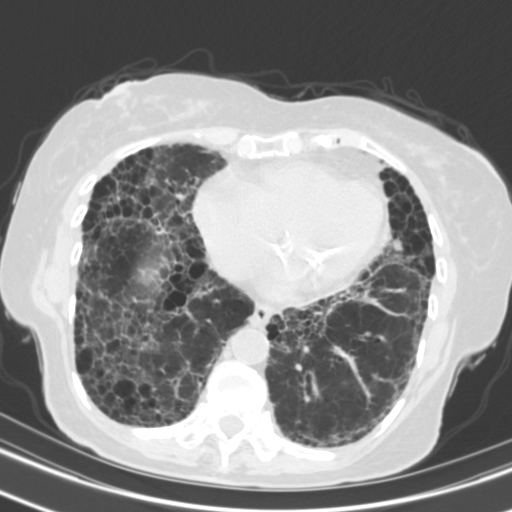
[im 43/124  mediastinal]
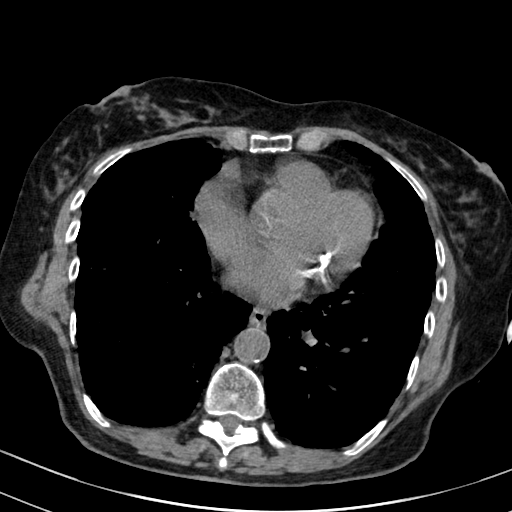
[im 43/124  lung]
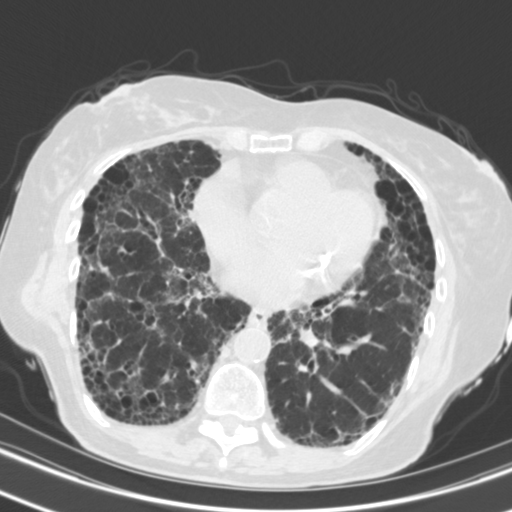
[im 49/124  lung]
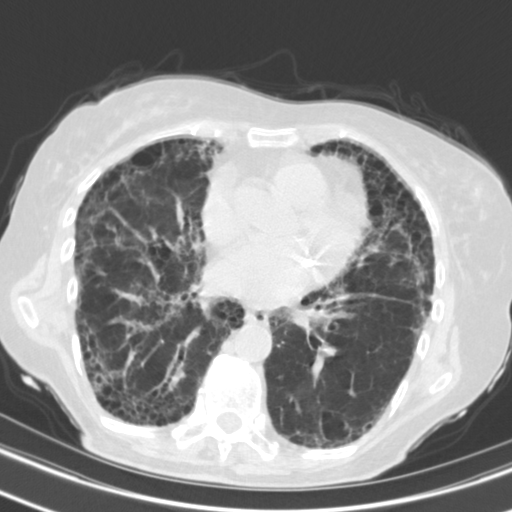
[im 58/124  lung]
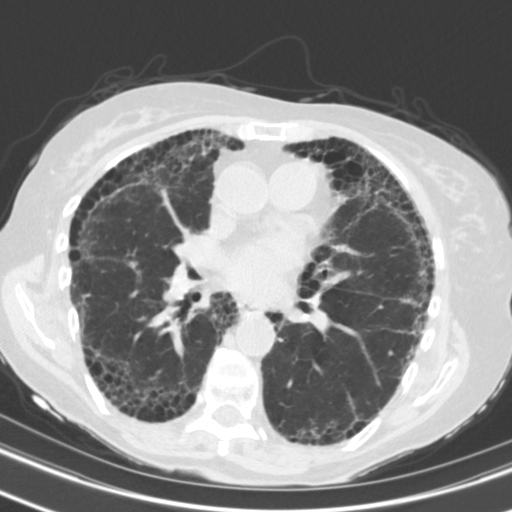
[im 65/124  lung]
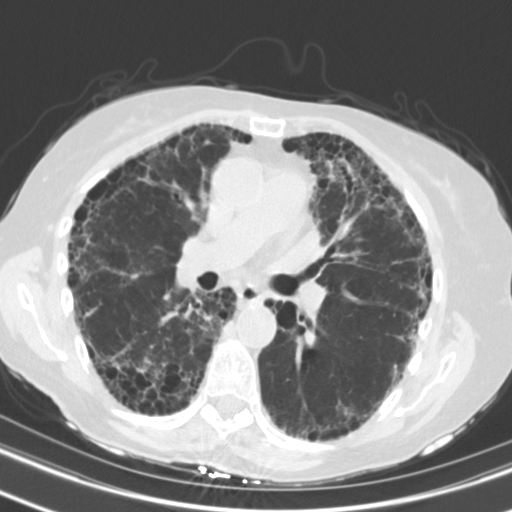
[im 75/124  mediastinal]
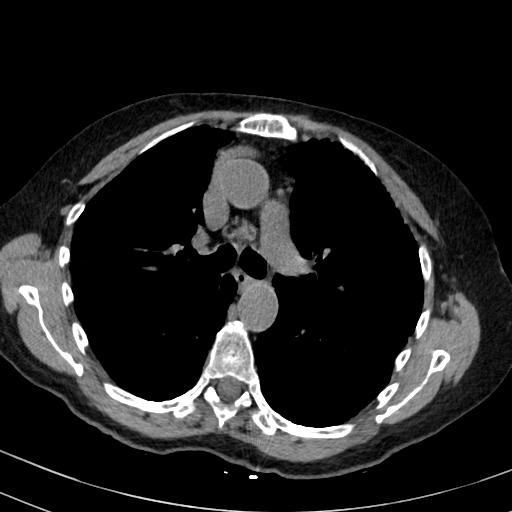
[im 75/124  lung]
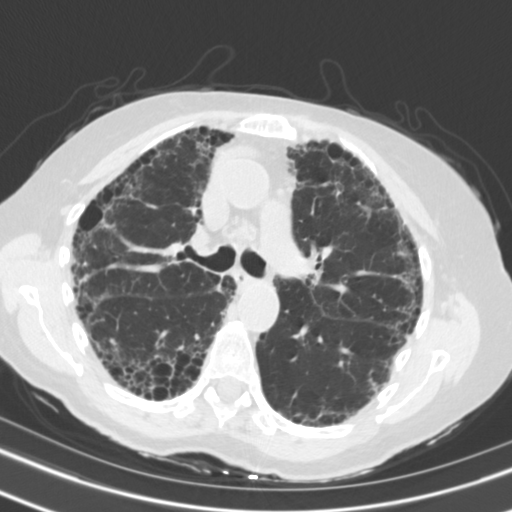
[im 81/124  lung]
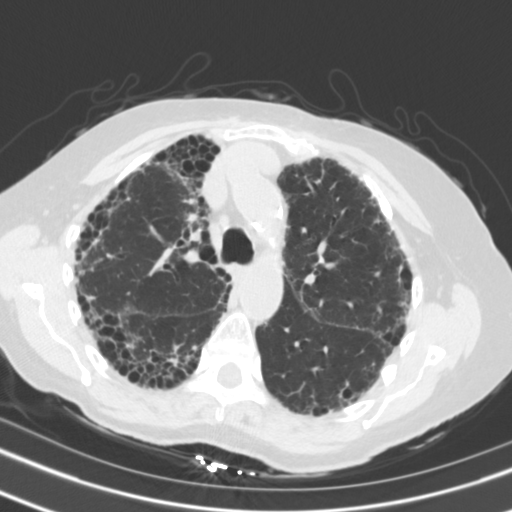
[im 86/124  lung]
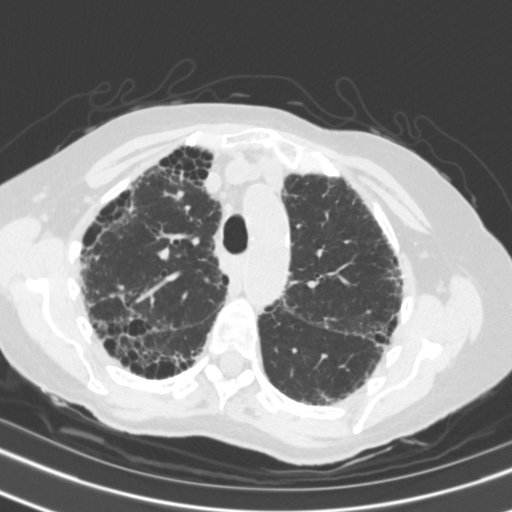
[im 97/124  lung]
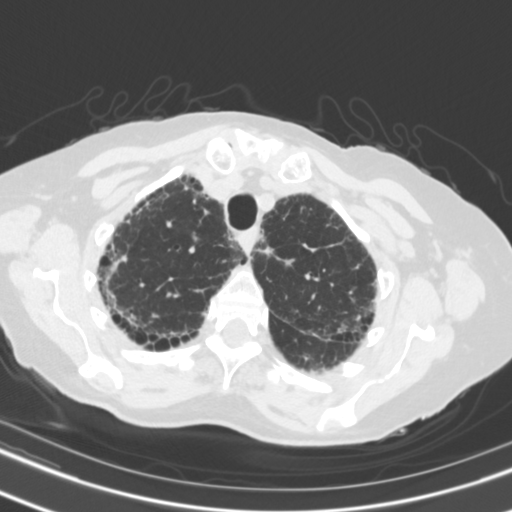
[im 107/124  mediastinal]
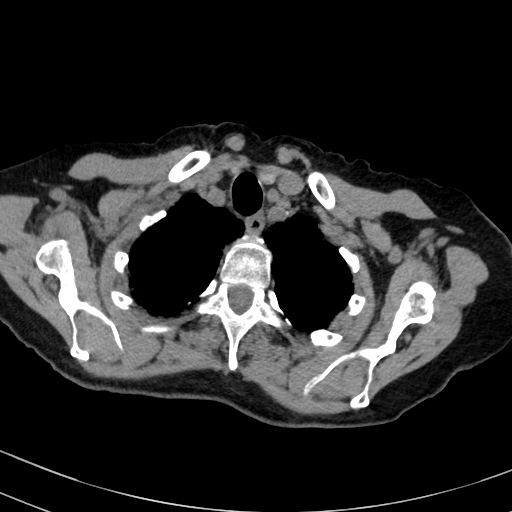
[im 107/124  lung]
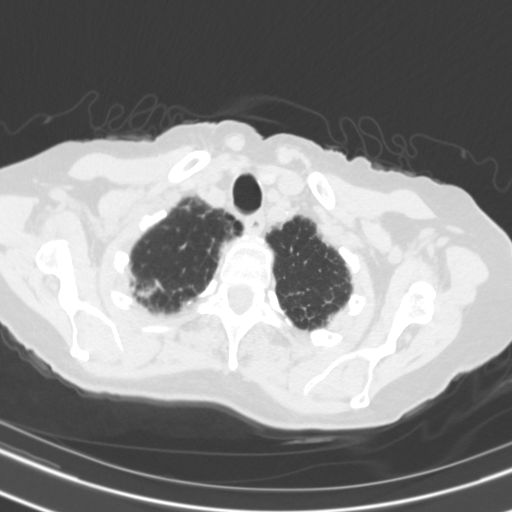
[im 118/124  lung]
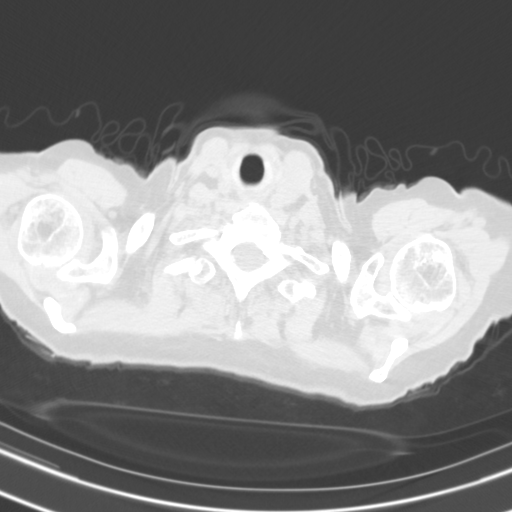

[14 of 33 positions shown; findings below may reference images not displayed]

FINDINGS: Cardiovascular: Atherosclerotic calcification of the aorta, aortic
valve and coronary arteries. Heart size normal. No pericardial
effusion.

Mediastinum/Nodes: Precarinal lymph node measures 1.5 cm but has a
fatty hilum. No pathologically enlarged mediastinal or axillary
lymph nodes. Hilar regions are difficult to evaluate without IV
contrast. Esophagus is grossly unremarkable.

Lungs/Pleura: Peripheral and basilar predominant subpleural
reticulation, traction bronchiectasis/bronchiolectasis and marked
honeycombing. Honeycombing has significantly port progressed from
12/31/2006. 6 mm lingular nodule (series 8, image 88) is new from
12/31/2006. No pleural fluid. Airway is unremarkable. No air
trapping.

Upper Abdomen: Liver margin is irregular. Stones fill the visualized
portion of the gallbladder. Visualized portions of the adrenal
glands, kidneys, spleen and stomach are grossly unremarkable. No
upper abdominal adenopathy.

Musculoskeletal: Degenerative changes in the spine. No worrisome
lytic or sclerotic lesions. Mid and lower thoracic compression
fractures are unchanged from chest radiograph 09/23/2017.
IMPRESSION: 1. Pulmonary parenchymal pattern of fibrosis, as described above,
has progressed from 12/31/2006. Findings are consistent with UIP per
consensus guidelines: Diagnosis of Idiopathic Pulmonary Fibrosis: An
Official ATS/ERS/JRS/ALAT Clinical Practice Guideline. Am J Respir
Crit Care Med Vol 198, Basal 5, ppe55-e[DATE].
recommended. If the nodule is stable at time of repeat CT, then
future CT at 18-24 months (from today's scan) is recommended for
high-risk patients, as clinically indicated. This recommendation
follows the consensus statement: Guidelines for Management of
Incidental Pulmonary Nodules Detected on CT Images: From the
3. Cirrhosis.
4. Cholelithiasis.
5. Aortic atherosclerosis (8DV6O-170.0). Coronary artery
calcification.

## 2020-09-13 DIAGNOSIS — R946 Abnormal results of thyroid function studies: Secondary | ICD-10-CM | POA: Diagnosis not present

## 2020-09-13 DIAGNOSIS — M810 Age-related osteoporosis without current pathological fracture: Secondary | ICD-10-CM | POA: Diagnosis not present

## 2020-09-13 DIAGNOSIS — E1129 Type 2 diabetes mellitus with other diabetic kidney complication: Secondary | ICD-10-CM | POA: Diagnosis not present

## 2020-09-13 DIAGNOSIS — I129 Hypertensive chronic kidney disease with stage 1 through stage 4 chronic kidney disease, or unspecified chronic kidney disease: Secondary | ICD-10-CM | POA: Diagnosis not present

## 2020-09-20 DIAGNOSIS — I129 Hypertensive chronic kidney disease with stage 1 through stage 4 chronic kidney disease, or unspecified chronic kidney disease: Secondary | ICD-10-CM | POA: Diagnosis not present

## 2020-09-20 DIAGNOSIS — E1165 Type 2 diabetes mellitus with hyperglycemia: Secondary | ICD-10-CM | POA: Diagnosis not present

## 2020-09-20 DIAGNOSIS — Z23 Encounter for immunization: Secondary | ICD-10-CM | POA: Diagnosis not present

## 2020-09-20 DIAGNOSIS — E1142 Type 2 diabetes mellitus with diabetic polyneuropathy: Secondary | ICD-10-CM | POA: Diagnosis not present

## 2020-09-20 DIAGNOSIS — N1831 Chronic kidney disease, stage 3a: Secondary | ICD-10-CM | POA: Diagnosis not present

## 2020-09-20 DIAGNOSIS — Z0000 Encounter for general adult medical examination without abnormal findings: Secondary | ICD-10-CM | POA: Diagnosis not present

## 2020-09-20 DIAGNOSIS — R82998 Other abnormal findings in urine: Secondary | ICD-10-CM | POA: Diagnosis not present

## 2020-09-20 DIAGNOSIS — E1129 Type 2 diabetes mellitus with other diabetic kidney complication: Secondary | ICD-10-CM | POA: Diagnosis not present

## 2020-09-25 ENCOUNTER — Encounter: Payer: Self-pay | Admitting: Podiatry

## 2020-09-25 ENCOUNTER — Ambulatory Visit (INDEPENDENT_AMBULATORY_CARE_PROVIDER_SITE_OTHER): Payer: Medicare Other | Admitting: Podiatry

## 2020-09-25 ENCOUNTER — Other Ambulatory Visit: Payer: Self-pay

## 2020-09-25 DIAGNOSIS — B351 Tinea unguium: Secondary | ICD-10-CM

## 2020-09-25 DIAGNOSIS — M79676 Pain in unspecified toe(s): Secondary | ICD-10-CM | POA: Diagnosis not present

## 2020-09-25 DIAGNOSIS — E1142 Type 2 diabetes mellitus with diabetic polyneuropathy: Secondary | ICD-10-CM

## 2020-09-25 DIAGNOSIS — D689 Coagulation defect, unspecified: Secondary | ICD-10-CM

## 2020-09-25 NOTE — Progress Notes (Signed)
This patient returns to my office for at risk foot care.  This patient requires this care by a professional since this patient will be at risk due to having diabetes and long term use of anticoagulants.    This patient is unable to cut nails herself since the patient cannot reach her nails.These nails are painful walking and wearing shoes.  This patient presents for at risk foot care today.  General Appearance  Alert, conversant and in no acute stress.  Vascular  Dorsalis pedis and posterior tibial  pulses are palpable  bilaterally.  Capillary return is within normal limits  bilaterally. Temperature is within normal limits  bilaterally.  Neurologic  Senn-Weinstein monofilament wire test diminished   bilaterally. Muscle power within normal limits bilaterally.  Nails Thick disfigured discolored nails with subungual debris  from hallux to fifth toes bilaterally.  Friable hallux toenails  B/L.   No evidence of bacterial infection or drainage bilaterally.  Orthopedic  No limitations of motion  feet .  No crepitus or effusions noted.  No bony pathology or digital deformities noted.  HAV  B/L.  Skin  normotropic skin with no porokeratosis noted bilaterally.  No signs of infections or ulcers noted.     Onychomycosis  Pain in right toes  Pain in left toes  Consent was obtained for treatment procedures.   Mechanical debridement of nails 1-5  bilaterally performed with a nail nipper.  Filed with dremel without incident. No infection or ulcer.     Return office visit   10 weeks                   Told patient to return for periodic foot care and evaluation due to potential at risk complications.   Jonaven Hilgers DPM  

## 2020-10-04 ENCOUNTER — Other Ambulatory Visit: Payer: Self-pay | Admitting: Internal Medicine

## 2020-10-04 ENCOUNTER — Ambulatory Visit
Admission: RE | Admit: 2020-10-04 | Discharge: 2020-10-04 | Disposition: A | Discharge status: Home / Self Care | Payer: Medicare Other | Source: Ambulatory Visit | Attending: Internal Medicine | Admitting: Internal Medicine

## 2020-10-04 ENCOUNTER — Other Ambulatory Visit: Payer: Self-pay

## 2020-10-04 DIAGNOSIS — R109 Unspecified abdominal pain: Secondary | ICD-10-CM

## 2020-10-04 DIAGNOSIS — E1129 Type 2 diabetes mellitus with other diabetic kidney complication: Secondary | ICD-10-CM | POA: Diagnosis not present

## 2020-10-04 DIAGNOSIS — R319 Hematuria, unspecified: Secondary | ICD-10-CM

## 2020-10-04 DIAGNOSIS — I129 Hypertensive chronic kidney disease with stage 1 through stage 4 chronic kidney disease, or unspecified chronic kidney disease: Secondary | ICD-10-CM | POA: Diagnosis not present

## 2020-10-04 DIAGNOSIS — N390 Urinary tract infection, site not specified: Secondary | ICD-10-CM | POA: Diagnosis not present

## 2020-10-15 DIAGNOSIS — I129 Hypertensive chronic kidney disease with stage 1 through stage 4 chronic kidney disease, or unspecified chronic kidney disease: Secondary | ICD-10-CM | POA: Diagnosis not present

## 2020-10-15 DIAGNOSIS — L89151 Pressure ulcer of sacral region, stage 1: Secondary | ICD-10-CM | POA: Diagnosis not present

## 2020-10-15 DIAGNOSIS — S22000A Wedge compression fracture of unspecified thoracic vertebra, initial encounter for closed fracture: Secondary | ICD-10-CM | POA: Diagnosis not present

## 2020-10-15 DIAGNOSIS — N1831 Chronic kidney disease, stage 3a: Secondary | ICD-10-CM | POA: Diagnosis not present

## 2020-10-22 DIAGNOSIS — M545 Low back pain, unspecified: Secondary | ICD-10-CM | POA: Diagnosis not present

## 2020-10-22 DIAGNOSIS — S22000D Wedge compression fracture of unspecified thoracic vertebra, subsequent encounter for fracture with routine healing: Secondary | ICD-10-CM | POA: Diagnosis not present

## 2020-10-22 DIAGNOSIS — N1831 Chronic kidney disease, stage 3a: Secondary | ICD-10-CM | POA: Diagnosis not present

## 2020-10-22 DIAGNOSIS — L89151 Pressure ulcer of sacral region, stage 1: Secondary | ICD-10-CM | POA: Diagnosis not present

## 2020-10-22 DIAGNOSIS — E1122 Type 2 diabetes mellitus with diabetic chronic kidney disease: Secondary | ICD-10-CM | POA: Diagnosis not present

## 2020-10-22 DIAGNOSIS — I129 Hypertensive chronic kidney disease with stage 1 through stage 4 chronic kidney disease, or unspecified chronic kidney disease: Secondary | ICD-10-CM | POA: Diagnosis not present

## 2020-11-12 DIAGNOSIS — J84112 Idiopathic pulmonary fibrosis: Secondary | ICD-10-CM | POA: Diagnosis not present

## 2020-11-15 ENCOUNTER — Ambulatory Visit (INDEPENDENT_AMBULATORY_CARE_PROVIDER_SITE_OTHER): Payer: Medicare Other | Admitting: Orthopedic Surgery

## 2020-11-15 DIAGNOSIS — S32050S Wedge compression fracture of fifth lumbar vertebra, sequela: Secondary | ICD-10-CM | POA: Diagnosis not present

## 2020-11-15 MED ORDER — HYDROCODONE-ACETAMINOPHEN 5-325 MG PO TABS: 1.0000 | ORAL_TABLET | ORAL | 0 refills | Status: DC | PRN

## 2020-11-19 ENCOUNTER — Encounter: Payer: Self-pay | Admitting: Orthopedic Surgery

## 2020-11-19 NOTE — Progress Notes (Signed)
Office Visit Note   Patient: Lauren Avery           Date of Birth: 02-24-1927           MRN: [id number] Visit Date: 11/15/2020              Requested by: Gaspar Garbe, MD [address] [place],  [place] [address] PCP: Wylene Simmer Adelfa Koh, MD  Chief Complaint  Patient presents with  . Lower Back - Pain  . Right Hip - Pain      HPI: Patient is a 86 year old woman who complains of increasing lower back pain over the past 6 weeks transversely across her lumbar spine she states the pain is unbearable she denies any numbness or tingling in her legs denies any radicular symptoms.  Patient states she has to sleep in a reclining chair has pain with weightbearing with a loose monopolar hemiarthroplasty on the right.  Assessment & Plan: Visit Diagnoses:  1. Compression fracture of L5 vertebra, sequela     Plan: Patient was given a prescription for Vicodin patient's medical problems are directly related to her osteoporosis with loosening of the monopolar hip as well as multiple lumbar compression fractures.  Will treat symptomatically and follow-up as needed  Follow-Up Instructions: Return if symptoms worsen or fail to improve.   Ortho Exam  Patient is alert, oriented, no adenopathy, well-dressed, normal affect, normal respiratory effort. Examination reviewing the CT scan shows old compression fractures L5 L2 L1 and T12.  Patient has no radicular symptoms.  Radiographs of her hip shows a loosening of the monopolar hip in the right hip.  Patient's pain is primarily focal in her lumbar spine without radicular symptoms.  Imaging: No results found. No images are attached to the encounter.  Labs: Lab Results  Component Value Date   HGBA1C 7.6 (H) 05/09/2015   ESRSEDRATE 29 (H) 02/24/2012   CRP 0.37 02/24/2012   REPTSTATUS 02/18/2013 FINAL 02/17/2013   CULT NO GROWTH 02/17/2013     Lab Results  Component Value Date   ALBUMIN 3.4 (L) 02/18/2013    No results found  for: MG No results found for: VD25OH  No results found for: PREALBUMIN CBC EXTENDED Latest Ref Rng & Units 05/16/2015 05/10/2015 05/09/2015  WBC 10:3/mL 8.7 7.9 8.3  RBC 3.87 - 5.11 MIL/uL - 3.70(L) 4.19  HGB 12.0 - 16.0 g/dL 30.1 10.7(L) 12.4  HCT 36 - 46 % 36 33.0(L) 36.7  PLT 150 - 399 K/L 180 92(L) 114(L)  NEUTROABS 1.7 - 7.7 K/uL - 4.9 -  LYMPHSABS 0.7 - 4.0 K/uL - 2.3 -     There is no height or weight on file to calculate BMI.  Orders:  No orders of the defined types were placed in this encounter.  Meds ordered this encounter  Medications  . HYDROcodone-acetaminophen (NORCO/VICODIN) 5-325 MG tablet    Sig: Take 1 tablet by mouth every 4 (four) hours as needed for moderate pain.    Dispense:  30 tablet    Refill:  0     Procedures: No procedures performed  Clinical Data: No additional findings.  ROS:  All other systems negative, except as noted in the HPI. Review of Systems  Objective: Vital Signs: There were no vitals taken for this visit.  Specialty Comments:  No specialty comments available.  PMFS History: Patient Active Problem List   Diagnosis Date Noted  . Coagulation disorder (HCC) 05/02/2020  . Physical deconditioning 09/23/2017  . Laryngopharyngeal reflux (LPR) 09/20/2015  .  Fracture of elbow, medial condyle, left, closed 05/09/2015  . Fracture of medial condyle of elbow 05/09/2015  . Long term (current) use of anticoagulants 03/21/2013  . GERD (gastroesophageal reflux disease) 02/24/2013  . Constipation 02/24/2013  . Fracture of elbow, medial condyle, right, closed 02/19/2013    Class: Acute  . Closed fracture of single pubic ramus of pelvis (HCC) 02/19/2013    Class: Acute  . Bilateral sacral insufficiency fracture 02/19/2013    Class: Chronic  . IPF (idiopathic pulmonary fibrosis) (HCC) 02/24/2012  . Chronic cough 02/24/2012  . DM 11/12/2007  . Essential hypertension 11/12/2007   Past Medical History:  Diagnosis Date  . Diabetes  mellitus   . Fracture 10/2012   left wrist  . Fracture of left clavicle    12/12 wore a sling  . GERD (gastroesophageal reflux disease)   . Hypertension   . Osteoporosis   . Pulmonary fibrosis (HCC)     Family History  Problem Relation Age of Onset  . Pancreatic cancer Brother   . Stroke Mother   . Diabetes Father     Past Surgical History:  Procedure Laterality Date  . FEMUR FRACTURE SURGERY      6/08 partial hip replacement right  . ganglion cyst removal     left wrist 1960  . HIP FRACTURE SURGERY     1984 pin and plate placed right  . HIP SURGERY     pin and plate removed 05/6282  . ORIF ELBOW FRACTURE Right 02/18/2013   Procedure: OPEN REDUCTION INTERNAL FIXATION (ORIF) ELBOW/OLECRANON FRACTURE;  Surgeon: Nadara Mustard, MD;  Location: [facility] OR;  Service: Orthopedics;  Laterality: Right;  . ORIF ELBOW FRACTURE Left 05/09/2015   Procedure: OPEN REDUCTION INTERNAL FIXATION (ORIF) LEFT MEDIAL HUMERAL CONDYLE FRACTURE;  Surgeon: Tarry Kos, MD;  Location: [facility] OR;  Service: Orthopedics;  Laterality: Left;   Social History   Occupational History  . Occupation: retired Airline pilot  Tobacco Use  . Smoking status: Never Smoker  . Smokeless tobacco: Never Used  Substance and Sexual Activity  . Alcohol use: No  . Drug use: No  . Sexual activity: Not on file

## 2020-11-21 DIAGNOSIS — L89151 Pressure ulcer of sacral region, stage 1: Secondary | ICD-10-CM | POA: Diagnosis not present

## 2020-11-21 DIAGNOSIS — N1831 Chronic kidney disease, stage 3a: Secondary | ICD-10-CM | POA: Diagnosis not present

## 2020-11-21 DIAGNOSIS — M545 Low back pain, unspecified: Secondary | ICD-10-CM | POA: Diagnosis not present

## 2020-11-21 DIAGNOSIS — S22000D Wedge compression fracture of unspecified thoracic vertebra, subsequent encounter for fracture with routine healing: Secondary | ICD-10-CM | POA: Diagnosis not present

## 2020-11-21 DIAGNOSIS — E1122 Type 2 diabetes mellitus with diabetic chronic kidney disease: Secondary | ICD-10-CM | POA: Diagnosis not present

## 2020-11-21 DIAGNOSIS — I129 Hypertensive chronic kidney disease with stage 1 through stage 4 chronic kidney disease, or unspecified chronic kidney disease: Secondary | ICD-10-CM | POA: Diagnosis not present

## 2020-12-05 ENCOUNTER — Encounter: Payer: Self-pay | Admitting: Podiatry

## 2020-12-05 ENCOUNTER — Ambulatory Visit: Payer: Medicare Other | Admitting: Podiatry

## 2020-12-05 ENCOUNTER — Other Ambulatory Visit: Payer: Self-pay

## 2020-12-05 DIAGNOSIS — B351 Tinea unguium: Secondary | ICD-10-CM

## 2020-12-05 DIAGNOSIS — M79676 Pain in unspecified toe(s): Secondary | ICD-10-CM | POA: Diagnosis not present

## 2020-12-05 DIAGNOSIS — E1142 Type 2 diabetes mellitus with diabetic polyneuropathy: Secondary | ICD-10-CM | POA: Diagnosis not present

## 2020-12-05 DIAGNOSIS — D689 Coagulation defect, unspecified: Secondary | ICD-10-CM

## 2020-12-05 NOTE — Progress Notes (Signed)
This patient returns to my office for at risk foot care.  This patient requires this care by a professional since this patient will be at risk due to having diabetes and long term use of anticoagulants.    This patient is unable to cut nails herself since the patient cannot reach her nails.These nails are painful walking and wearing shoes.  This patient presents for at risk foot care today.  General Appearance  Alert, conversant and in no acute stress.  Vascular  Dorsalis pedis and posterior tibial  pulses are palpable  bilaterally.  Capillary return is within normal limits  bilaterally. Temperature is within normal limits  bilaterally.  Neurologic  Senn-Weinstein monofilament wire test diminished   bilaterally. Muscle power within normal limits bilaterally.  Nails Thick disfigured discolored nails with subungual debris  from hallux to fifth toes bilaterally.  Friable hallux toenails  B/L.   No evidence of bacterial infection or drainage bilaterally.  Orthopedic  No limitations of motion  feet .  No crepitus or effusions noted.  No bony pathology or digital deformities noted.  HAV  B/L.  Skin  normotropic skin with no porokeratosis noted bilaterally.  No signs of infections or ulcers noted.     Onychomycosis  Pain in right toes  Pain in left toes  Consent was obtained for treatment procedures.   Mechanical debridement of nails 1-5  bilaterally performed with a nail nipper.  Filed with dremel without incident. No infection or ulcer.     Return office visit   10 weeks                   Told patient to return for periodic foot care and evaluation due to potential at risk complications.   Helane Gunther DPM

## 2020-12-13 DIAGNOSIS — J84112 Idiopathic pulmonary fibrosis: Secondary | ICD-10-CM | POA: Diagnosis not present

## 2020-12-28 ENCOUNTER — Ambulatory Visit: Payer: Self-pay

## 2020-12-28 ENCOUNTER — Ambulatory Visit: Payer: Medicare Other | Admitting: Orthopaedic Surgery

## 2020-12-28 ENCOUNTER — Encounter: Payer: Self-pay | Admitting: Orthopaedic Surgery

## 2020-12-28 VITALS — BP 142/80 | HR 113

## 2020-12-28 DIAGNOSIS — M79605 Pain in left leg: Secondary | ICD-10-CM | POA: Diagnosis not present

## 2020-12-28 DIAGNOSIS — R0781 Pleurodynia: Secondary | ICD-10-CM | POA: Diagnosis not present

## 2020-12-28 MED ORDER — TRAMADOL HCL 50 MG PO TABS: ORAL_TABLET | ORAL | 2 refills | Status: DC

## 2020-12-28 NOTE — Progress Notes (Signed)
Office Visit Note   Patient: Lauren Avery           Date of Birth: August 27, 1927           MRN: [id number] Visit Date: 12/28/2020              Requested by: Gaspar Garbe, MD [address] [place],  [place] [address] PCP: Gaspar Garbe, MD   Assessment & Plan: Visit Diagnoses:  1. Rib pain on left side   2. Pain in left leg     Plan: Impression is left rib contusion versus occult fracture.  We will treat symptomatically with tramadol.  Norco makes her nauseous.  She will contact her PCP about whether meloxicam is safe for her to take.  We will see her back as needed.  Follow-Up Instructions: Return if symptoms worsen or fail to improve.   Orders:  Orders Placed This Encounter  Procedures  . XR Ribs Unilateral Left  . XR Lumbar Spine 2-3 Views   Meds ordered this encounter  Medications  . traMADol (ULTRAM) 50 MG tablet    Sig: Take 1/2-1 tablet three times daily as needed    Dispense:  60 tablet    Refill:  2      Procedures: No procedures performed   Clinical Data: No additional findings.   Subjective: Chief Complaint  Patient presents with  . Lower Back - Pain    Lauren Avery is a 87 year old female comes in for evaluation of left-sided rib pain.  She had a fall into a door on her left side.  She has pain with deep breathing.  She is a wheelchair ambulator at baseline.  She is brought in by her daughters today.   Review of Systems  Constitutional: Negative.   HENT: Negative.   Eyes: Negative.   Respiratory: Negative.   Cardiovascular: Negative.   Endocrine: Negative.   Musculoskeletal: Negative.   Neurological: Negative.   Hematological: Negative.   Psychiatric/Behavioral: Negative.   All other systems reviewed and are negative.    Objective: Vital Signs: BP (!) 142/80   Pulse (!) 113   SpO2 96%   Physical Exam Vitals and nursing note reviewed.  Constitutional:      Appearance: She is well-developed and well-nourished.   HENT:     Head: Normocephalic and atraumatic.  Eyes:     Extraocular Movements: EOM normal.  Pulmonary:     Effort: Pulmonary effort is normal.  Abdominal:     Palpations: Abdomen is soft.  Musculoskeletal:     Cervical back: Neck supple.  Skin:    General: Skin is warm.     Capillary Refill: Capillary refill takes less than 2 seconds.  Neurological:     Mental Status: She is alert and oriented to person, place, and time.  Psychiatric:        Mood and Affect: Mood and affect normal.        Behavior: Behavior normal.        Thought Content: Thought content normal.        Judgment: Judgment normal.     Ortho Exam She has tenderness of some of the ribs on her left side.  She does not demonstrate any labored breathing.  There is no bruising. Specialty Comments:  No specialty comments available.  Imaging: XR Lumbar Spine 2-3 Views  Result Date: 12/28/2020 Multiple chronic compression fractures.  Severe lumbar spondylosis.  Widespread degenerative changes.  XR Ribs Unilateral Left  Result Date: 12/28/2020  No obvious evidence of rib fractures.    PMFS History: Patient Active Problem List   Diagnosis Date Noted  . Coagulation disorder (HCC) 05/02/2020  . Physical deconditioning 09/23/2017  . Laryngopharyngeal reflux (LPR) 09/20/2015  . Fracture of elbow, medial condyle, left, closed 05/09/2015  . Fracture of medial condyle of elbow 05/09/2015  . Long term (current) use of anticoagulants 03/21/2013  . GERD (gastroesophageal reflux disease) 02/24/2013  . Constipation 02/24/2013  . Fracture of elbow, medial condyle, right, closed 02/19/2013    Class: Acute  . Closed fracture of single pubic ramus of pelvis (HCC) 02/19/2013    Class: Acute  . Bilateral sacral insufficiency fracture 02/19/2013    Class: Chronic  . IPF (idiopathic pulmonary fibrosis) (HCC) 02/24/2012  . Chronic cough 02/24/2012  . DM 11/12/2007  . Essential hypertension 11/12/2007   Past Medical  History:  Diagnosis Date  . Diabetes mellitus   . Fracture 10/2012   left wrist  . Fracture of left clavicle    12/12 wore a sling  . GERD (gastroesophageal reflux disease)   . Hypertension   . Osteoporosis   . Pulmonary fibrosis (HCC)     Family History  Problem Relation Age of Onset  . Pancreatic cancer Brother   . Stroke Mother   . Diabetes Father     Past Surgical History:  Procedure Laterality Date  . FEMUR FRACTURE SURGERY      6/08 partial hip replacement right  . ganglion cyst removal     left wrist 1960  . HIP FRACTURE SURGERY     1984 pin and plate placed right  . HIP SURGERY     pin and plate removed 12/173  . ORIF ELBOW FRACTURE Right 02/18/2013   Procedure: OPEN REDUCTION INTERNAL FIXATION (ORIF) ELBOW/OLECRANON FRACTURE;  Surgeon: Nadara Mustard, MD;  Location: [facility] OR;  Service: Orthopedics;  Laterality: Right;  . ORIF ELBOW FRACTURE Left 05/09/2015   Procedure: OPEN REDUCTION INTERNAL FIXATION (ORIF) LEFT MEDIAL HUMERAL CONDYLE FRACTURE;  Surgeon: Tarry Kos, MD;  Location: [facility] OR;  Service: Orthopedics;  Laterality: Left;   Social History   Occupational History  . Occupation: retired Airline pilot  Tobacco Use  . Smoking status: Never Smoker  . Smokeless tobacco: Never Used  Substance and Sexual Activity  . Alcohol use: No  . Drug use: No  . Sexual activity: Not on file

## 2021-01-07 DIAGNOSIS — F39 Unspecified mood [affective] disorder: Secondary | ICD-10-CM | POA: Diagnosis not present

## 2021-01-07 DIAGNOSIS — R58 Hemorrhage, not elsewhere classified: Secondary | ICD-10-CM | POA: Diagnosis not present

## 2021-01-07 DIAGNOSIS — M545 Low back pain, unspecified: Secondary | ICD-10-CM | POA: Diagnosis not present

## 2021-01-07 DIAGNOSIS — R351 Nocturia: Secondary | ICD-10-CM | POA: Diagnosis not present

## 2021-01-08 DIAGNOSIS — E1129 Type 2 diabetes mellitus with other diabetic kidney complication: Secondary | ICD-10-CM | POA: Diagnosis not present

## 2021-01-08 DIAGNOSIS — N1831 Chronic kidney disease, stage 3a: Secondary | ICD-10-CM | POA: Diagnosis not present

## 2021-01-08 DIAGNOSIS — Z794 Long term (current) use of insulin: Secondary | ICD-10-CM | POA: Diagnosis not present

## 2021-01-08 DIAGNOSIS — I129 Hypertensive chronic kidney disease with stage 1 through stage 4 chronic kidney disease, or unspecified chronic kidney disease: Secondary | ICD-10-CM | POA: Diagnosis not present

## 2021-01-13 DIAGNOSIS — J84112 Idiopathic pulmonary fibrosis: Secondary | ICD-10-CM | POA: Diagnosis not present

## 2021-02-12 DIAGNOSIS — F39 Unspecified mood [affective] disorder: Secondary | ICD-10-CM | POA: Diagnosis not present

## 2021-02-12 DIAGNOSIS — R351 Nocturia: Secondary | ICD-10-CM | POA: Diagnosis not present

## 2021-02-12 DIAGNOSIS — R58 Hemorrhage, not elsewhere classified: Secondary | ICD-10-CM | POA: Diagnosis not present

## 2021-03-01 ENCOUNTER — Other Ambulatory Visit: Payer: Self-pay

## 2021-03-01 ENCOUNTER — Ambulatory Visit (INDEPENDENT_AMBULATORY_CARE_PROVIDER_SITE_OTHER): Payer: Medicare Other | Admitting: Podiatry

## 2021-03-01 ENCOUNTER — Encounter: Payer: Self-pay | Admitting: Podiatry

## 2021-03-01 DIAGNOSIS — B351 Tinea unguium: Secondary | ICD-10-CM | POA: Diagnosis not present

## 2021-03-01 DIAGNOSIS — D689 Coagulation defect, unspecified: Secondary | ICD-10-CM | POA: Diagnosis not present

## 2021-03-01 DIAGNOSIS — M79676 Pain in unspecified toe(s): Secondary | ICD-10-CM | POA: Diagnosis not present

## 2021-03-01 DIAGNOSIS — E1142 Type 2 diabetes mellitus with diabetic polyneuropathy: Secondary | ICD-10-CM | POA: Diagnosis not present

## 2021-03-01 NOTE — Progress Notes (Signed)
This patient returns to my office for at risk foot care.  This patient requires this care by a professional since this patient will be at risk due to having diabetes and long term use of anticoagulants.    This patient is unable to cut nails herself since the patient cannot reach her nails.These nails are painful walking and wearing shoes.  This patient presents for at risk foot care today.  General Appearance  Alert, conversant and in no acute stress.  Vascular  Dorsalis pedis and posterior tibial  pulses are weakly  palpable  bilaterally.  Capillary return is within normal limits  Bilaterally.Cold feet  Bilaterally. Absent digital hair.  Neurologic  Senn-Weinstein monofilament wire test diminished   bilaterally. Muscle power within normal limits bilaterally.  Nails Thick disfigured discolored nails with subungual debris  from hallux to fifth toes bilaterally.  Friable hallux toenails  B/L.   No evidence of bacterial infection or drainage bilaterally.  Orthopedic  No limitations of motion  feet .  No crepitus or effusions noted.  No bony pathology or digital deformities noted.  HAV  B/L.  Skin  normotropic skin with no porokeratosis noted bilaterally.  No signs of infections or ulcers noted.     Onychomycosis  Pain in right toes  Pain in left toes  Consent was obtained for treatment procedures.   Mechanical debridement of nails 1-5  bilaterally performed with a nail nipper.  Filed with dremel without incident. No infection or ulcer.     Return office visit   10 weeks                   Told patient to return for periodic foot care and evaluation due to potential at risk complications.   Helane Gunther DPM

## 2021-03-21 DIAGNOSIS — H524 Presbyopia: Secondary | ICD-10-CM | POA: Diagnosis not present

## 2021-03-21 DIAGNOSIS — H52223 Regular astigmatism, bilateral: Secondary | ICD-10-CM | POA: Diagnosis not present

## 2021-03-21 DIAGNOSIS — E119 Type 2 diabetes mellitus without complications: Secondary | ICD-10-CM | POA: Diagnosis not present

## 2021-03-21 DIAGNOSIS — H5213 Myopia, bilateral: Secondary | ICD-10-CM | POA: Diagnosis not present

## 2021-03-27 DIAGNOSIS — E1129 Type 2 diabetes mellitus with other diabetic kidney complication: Secondary | ICD-10-CM | POA: Diagnosis not present

## 2021-03-27 DIAGNOSIS — I129 Hypertensive chronic kidney disease with stage 1 through stage 4 chronic kidney disease, or unspecified chronic kidney disease: Secondary | ICD-10-CM | POA: Diagnosis not present

## 2021-03-27 DIAGNOSIS — Z794 Long term (current) use of insulin: Secondary | ICD-10-CM | POA: Diagnosis not present

## 2021-03-27 DIAGNOSIS — F39 Unspecified mood [affective] disorder: Secondary | ICD-10-CM | POA: Diagnosis not present

## 2021-04-11 ENCOUNTER — Emergency Department (HOSPITAL_COMMUNITY): Payer: Medicare Other

## 2021-04-11 ENCOUNTER — Observation Stay (HOSPITAL_COMMUNITY): Payer: Medicare Other

## 2021-04-11 ENCOUNTER — Other Ambulatory Visit: Payer: Self-pay

## 2021-04-11 ENCOUNTER — Inpatient Hospital Stay (HOSPITAL_COMMUNITY)
Admission: EM | Admit: 2021-04-11 | Discharge: 2021-04-17 | DRG: 536 | Disposition: A | Discharge status: Skilled Nursing Facility | Payer: Medicare Other | Attending: Family Medicine | Admitting: Family Medicine

## 2021-04-11 ENCOUNTER — Encounter (HOSPITAL_COMMUNITY): Payer: Self-pay | Admitting: Internal Medicine

## 2021-04-11 DIAGNOSIS — M25519 Pain in unspecified shoulder: Secondary | ICD-10-CM | POA: Diagnosis not present

## 2021-04-11 DIAGNOSIS — S32512A Fracture of superior rim of left pubis, initial encounter for closed fracture: Secondary | ICD-10-CM | POA: Diagnosis not present

## 2021-04-11 DIAGNOSIS — S20219A Contusion of unspecified front wall of thorax, initial encounter: Secondary | ICD-10-CM | POA: Diagnosis present

## 2021-04-11 DIAGNOSIS — Z23 Encounter for immunization: Secondary | ICD-10-CM

## 2021-04-11 DIAGNOSIS — G9340 Encephalopathy, unspecified: Secondary | ICD-10-CM | POA: Diagnosis present

## 2021-04-11 DIAGNOSIS — N182 Chronic kidney disease, stage 2 (mild): Secondary | ICD-10-CM

## 2021-04-11 DIAGNOSIS — S3282XA Multiple fractures of pelvis without disruption of pelvic ring, initial encounter for closed fracture: Secondary | ICD-10-CM

## 2021-04-11 DIAGNOSIS — Z7951 Long term (current) use of inhaled steroids: Secondary | ICD-10-CM

## 2021-04-11 DIAGNOSIS — S329XXA Fracture of unspecified parts of lumbosacral spine and pelvis, initial encounter for closed fracture: Secondary | ICD-10-CM | POA: Diagnosis not present

## 2021-04-11 DIAGNOSIS — S32599A Other specified fracture of unspecified pubis, initial encounter for closed fracture: Secondary | ICD-10-CM | POA: Diagnosis present

## 2021-04-11 DIAGNOSIS — S32592A Other specified fracture of left pubis, initial encounter for closed fracture: Secondary | ICD-10-CM | POA: Diagnosis present

## 2021-04-11 DIAGNOSIS — Z20822 Contact with and (suspected) exposure to covid-19: Secondary | ICD-10-CM | POA: Diagnosis present

## 2021-04-11 DIAGNOSIS — I10 Essential (primary) hypertension: Secondary | ICD-10-CM | POA: Diagnosis not present

## 2021-04-11 DIAGNOSIS — Y92009 Unspecified place in unspecified non-institutional (private) residence as the place of occurrence of the external cause: Secondary | ICD-10-CM

## 2021-04-11 DIAGNOSIS — R748 Abnormal levels of other serum enzymes: Secondary | ICD-10-CM | POA: Diagnosis present

## 2021-04-11 DIAGNOSIS — Z043 Encounter for examination and observation following other accident: Secondary | ICD-10-CM | POA: Diagnosis not present

## 2021-04-11 DIAGNOSIS — R1313 Dysphagia, pharyngeal phase: Secondary | ICD-10-CM | POA: Diagnosis present

## 2021-04-11 DIAGNOSIS — M25552 Pain in left hip: Secondary | ICD-10-CM | POA: Diagnosis not present

## 2021-04-11 DIAGNOSIS — R4182 Altered mental status, unspecified: Secondary | ICD-10-CM | POA: Diagnosis not present

## 2021-04-11 DIAGNOSIS — W010XXA Fall on same level from slipping, tripping and stumbling without subsequent striking against object, initial encounter: Secondary | ICD-10-CM | POA: Diagnosis present

## 2021-04-11 DIAGNOSIS — I4510 Unspecified right bundle-branch block: Secondary | ICD-10-CM | POA: Diagnosis present

## 2021-04-11 DIAGNOSIS — E1159 Type 2 diabetes mellitus with other circulatory complications: Secondary | ICD-10-CM | POA: Diagnosis present

## 2021-04-11 DIAGNOSIS — R0902 Hypoxemia: Secondary | ICD-10-CM | POA: Diagnosis not present

## 2021-04-11 DIAGNOSIS — M542 Cervicalgia: Secondary | ICD-10-CM | POA: Diagnosis not present

## 2021-04-11 DIAGNOSIS — E119 Type 2 diabetes mellitus without complications: Secondary | ICD-10-CM | POA: Diagnosis present

## 2021-04-11 DIAGNOSIS — Z7952 Long term (current) use of systemic steroids: Secondary | ICD-10-CM

## 2021-04-11 DIAGNOSIS — Y9301 Activity, walking, marching and hiking: Secondary | ICD-10-CM | POA: Diagnosis present

## 2021-04-11 DIAGNOSIS — M25551 Pain in right hip: Secondary | ICD-10-CM | POA: Diagnosis not present

## 2021-04-11 DIAGNOSIS — K219 Gastro-esophageal reflux disease without esophagitis: Secondary | ICD-10-CM | POA: Diagnosis present

## 2021-04-11 DIAGNOSIS — D72829 Elevated white blood cell count, unspecified: Secondary | ICD-10-CM | POA: Diagnosis present

## 2021-04-11 DIAGNOSIS — F32A Depression, unspecified: Secondary | ICD-10-CM | POA: Diagnosis present

## 2021-04-11 DIAGNOSIS — Z79899 Other long term (current) drug therapy: Secondary | ICD-10-CM

## 2021-04-11 DIAGNOSIS — J84112 Idiopathic pulmonary fibrosis: Secondary | ICD-10-CM | POA: Diagnosis present

## 2021-04-11 DIAGNOSIS — I152 Hypertension secondary to endocrine disorders: Secondary | ICD-10-CM | POA: Diagnosis present

## 2021-04-11 DIAGNOSIS — M810 Age-related osteoporosis without current pathological fracture: Secondary | ICD-10-CM | POA: Diagnosis present

## 2021-04-11 DIAGNOSIS — S50319A Abrasion of unspecified elbow, initial encounter: Secondary | ICD-10-CM | POA: Diagnosis present

## 2021-04-11 DIAGNOSIS — J439 Emphysema, unspecified: Secondary | ICD-10-CM | POA: Diagnosis present

## 2021-04-11 DIAGNOSIS — Z96641 Presence of right artificial hip joint: Secondary | ICD-10-CM | POA: Diagnosis present

## 2021-04-11 DIAGNOSIS — E1122 Type 2 diabetes mellitus with diabetic chronic kidney disease: Secondary | ICD-10-CM

## 2021-04-11 DIAGNOSIS — Z888 Allergy status to other drugs, medicaments and biological substances status: Secondary | ICD-10-CM

## 2021-04-11 DIAGNOSIS — I517 Cardiomegaly: Secondary | ICD-10-CM | POA: Diagnosis not present

## 2021-04-11 DIAGNOSIS — Z833 Family history of diabetes mellitus: Secondary | ICD-10-CM

## 2021-04-11 DIAGNOSIS — T148XXA Other injury of unspecified body region, initial encounter: Secondary | ICD-10-CM

## 2021-04-11 LAB — COMPREHENSIVE METABOLIC PANEL
ALT: 29 U/L (ref 0–44)
AST: 55 U/L — ABNORMAL HIGH (ref 15–41)
Albumin: 3.6 g/dL (ref 3.5–5.0)
Alkaline Phosphatase: 116 U/L (ref 38–126)
Anion gap: 7 (ref 5–15)
BUN: 12 mg/dL (ref 8–23)
CO2: 27 mmol/L (ref 22–32)
Calcium: 10 mg/dL (ref 8.9–10.3)
Chloride: 97 mmol/L — ABNORMAL LOW (ref 98–111)
Creatinine, Ser: 0.92 mg/dL (ref 0.44–1.00)
GFR, Estimated: 58 mL/min — ABNORMAL LOW (ref >60)
Glucose, Bld: 179 mg/dL — ABNORMAL HIGH (ref 70–99)
Potassium: 4.3 mmol/L (ref 3.5–5.1)
Sodium: 131 mmol/L — ABNORMAL LOW (ref 135–145)
Total Bilirubin: 4.2 mg/dL — ABNORMAL HIGH (ref 0.3–1.2)
Total Protein: 7 g/dL (ref 6.5–8.1)

## 2021-04-11 LAB — CBC WITH DIFFERENTIAL/PLATELET
Abs Immature Granulocytes: 0.13 10*3/uL — ABNORMAL HIGH (ref 0.00–0.07)
Basophils Absolute: 0 10*3/uL (ref 0.0–0.1)
Basophils Relative: 0 %
Eosinophils Absolute: 0 10*3/uL (ref 0.0–0.5)
Eosinophils Relative: 0 %
HCT: 42 % (ref 36.0–46.0)
Hemoglobin: 13.9 g/dL (ref 12.0–15.0)
Immature Granulocytes: 1 %
Lymphocytes Relative: 9 %
Lymphs Abs: 1.2 10*3/uL (ref 0.7–4.0)
MCH: 30.5 pg (ref 26.0–34.0)
MCHC: 33.1 g/dL (ref 30.0–36.0)
MCV: 92.1 fL (ref 80.0–100.0)
Monocytes Absolute: 1.1 10*3/uL — ABNORMAL HIGH (ref 0.1–1.0)
Monocytes Relative: 8 %
Neutro Abs: 11 10*3/uL — ABNORMAL HIGH (ref 1.7–7.7)
Neutrophils Relative %: 82 %
Platelets: 97 10*3/uL — ABNORMAL LOW (ref 150–400)
RBC: 4.56 MIL/uL (ref 3.87–5.11)
RDW: 14.6 % (ref 11.5–15.5)
WBC: 13.5 10*3/uL — ABNORMAL HIGH (ref 4.0–10.5)
nRBC: 0 % (ref 0.0–0.2)

## 2021-04-11 LAB — URINALYSIS, ROUTINE W REFLEX MICROSCOPIC
Bacteria, UA: NONE SEEN
Bilirubin Urine: NEGATIVE
Glucose, UA: 50 mg/dL — AB
Ketones, ur: NEGATIVE mg/dL
Leukocytes,Ua: NEGATIVE
Nitrite: NEGATIVE
Protein, ur: NEGATIVE mg/dL
Specific Gravity, Urine: 1.015 (ref 1.005–1.030)
pH: 7 (ref 5.0–8.0)

## 2021-04-11 LAB — CK: Total CK: 590 U/L — ABNORMAL HIGH (ref 38–234)

## 2021-04-11 LAB — GLUCOSE, CAPILLARY: Glucose-Capillary: 148 mg/dL — ABNORMAL HIGH (ref 70–99)

## 2021-04-11 MED ORDER — INSULIN GLARGINE 100 UNIT/ML ~~LOC~~ SOLN
8.0000 [IU] | Freq: Every day | SUBCUTANEOUS | Status: DC
  Administered 2021-04-12 – 2021-04-16 (×5): 8 [IU] via SUBCUTANEOUS
  Filled 2021-04-11 (×6): qty 0.08

## 2021-04-11 MED ORDER — ACETAMINOPHEN 325 MG PO TABS
650.0000 mg | ORAL_TABLET | Freq: Four times a day (QID) | ORAL | Status: DC | PRN
  Administered 2021-04-11 – 2021-04-16 (×10): 650 mg via ORAL
  Filled 2021-04-11 (×10): qty 2

## 2021-04-11 MED ORDER — INSULIN ASPART 100 UNIT/ML IJ SOLN
0.0000 [IU] | Freq: Three times a day (TID) | INTRAMUSCULAR | Status: DC
  Administered 2021-04-12: 5 [IU] via SUBCUTANEOUS
  Administered 2021-04-12: 3 [IU] via SUBCUTANEOUS
  Administered 2021-04-12: 2 [IU] via SUBCUTANEOUS
  Administered 2021-04-13: 1 [IU] via SUBCUTANEOUS
  Administered 2021-04-13 (×2): 2 [IU] via SUBCUTANEOUS
  Administered 2021-04-14: 1 [IU] via SUBCUTANEOUS
  Administered 2021-04-14: 3 [IU] via SUBCUTANEOUS
  Administered 2021-04-14: 2 [IU] via SUBCUTANEOUS
  Administered 2021-04-15: 3 [IU] via SUBCUTANEOUS
  Administered 2021-04-15: 1 [IU] via SUBCUTANEOUS
  Administered 2021-04-15: 2 [IU] via SUBCUTANEOUS
  Administered 2021-04-16: 3 [IU] via SUBCUTANEOUS
  Administered 2021-04-16: 1 [IU] via SUBCUTANEOUS

## 2021-04-11 MED ORDER — LABETALOL HCL 5 MG/ML IV SOLN
10.0000 mg | INTRAVENOUS | Status: DC | PRN
  Administered 2021-04-12: 10 mg via INTRAVENOUS
  Filled 2021-04-11: qty 4

## 2021-04-11 MED ORDER — ACETAMINOPHEN 650 MG RE SUPP: 650.0000 mg | Freq: Four times a day (QID) | RECTAL | Status: DC | PRN

## 2021-04-11 MED ORDER — TETANUS-DIPHTH-ACELL PERTUSSIS 5-2.5-18.5 LF-MCG/0.5 IM SUSY
0.5000 mL | PREFILLED_SYRINGE | Freq: Once | INTRAMUSCULAR | Status: AC
  Administered 2021-04-11: 0.5 mL via INTRAMUSCULAR
  Filled 2021-04-11: qty 0.5

## 2021-04-11 MED ORDER — LACTATED RINGERS IV BOLUS
1000.0000 mL | Freq: Once | INTRAVENOUS | Status: AC
  Administered 2021-04-11: 1000 mL via INTRAVENOUS

## 2021-04-11 MED ORDER — METHOCARBAMOL 1000 MG/10ML IJ SOLN
500.0000 mg | Freq: Four times a day (QID) | INTRAVENOUS | Status: DC | PRN
  Filled 2021-04-11 (×2): qty 5

## 2021-04-11 MED ORDER — SERTRALINE HCL 25 MG PO TABS
25.0000 mg | ORAL_TABLET | Freq: Every day | ORAL | Status: DC
  Administered 2021-04-11 – 2021-04-16 (×6): 25 mg via ORAL
  Filled 2021-04-11 (×6): qty 1

## 2021-04-11 MED ORDER — MORPHINE SULFATE (PF) 2 MG/ML IV SOLN: 0.5000 mg | INTRAVENOUS | Status: DC | PRN

## 2021-04-11 MED ORDER — SODIUM CHLORIDE 0.9 % IV SOLN: INTRAVENOUS | Status: AC

## 2021-04-11 MED ORDER — MOMETASONE FURO-FORMOTEROL FUM 200-5 MCG/ACT IN AERO
2.0000 | INHALATION_SPRAY | Freq: Two times a day (BID) | RESPIRATORY_TRACT | Status: DC
  Administered 2021-04-12 – 2021-04-16 (×9): 2 via RESPIRATORY_TRACT
  Filled 2021-04-11: qty 8.8

## 2021-04-11 MED ORDER — LOSARTAN POTASSIUM 50 MG PO TABS
50.0000 mg | ORAL_TABLET | Freq: Every day | ORAL | Status: DC
  Administered 2021-04-12 – 2021-04-16 (×5): 50 mg via ORAL
  Filled 2021-04-11 (×5): qty 1

## 2021-04-11 MED ORDER — PANTOPRAZOLE SODIUM 40 MG PO TBEC
40.0000 mg | DELAYED_RELEASE_TABLET | Freq: Every day | ORAL | Status: DC
  Administered 2021-04-12 – 2021-04-16 (×5): 40 mg via ORAL
  Filled 2021-04-11 (×2): qty 1
  Filled 2021-04-11: qty 2
  Filled 2021-04-11 (×2): qty 1

## 2021-04-11 NOTE — ED Notes (Signed)
Dr. Rhunette Croft to bedside to evaluate.

## 2021-04-11 NOTE — H&P (Signed)
History and Physical    Lauren Avery:[id number] DOB: 31-Oct-1927 DOA: 04/11/2021  PCP: Gaspar Garbe, MD  Patient coming from: Home.  Chief Complaint: Fall.  HPI: Lauren Avery is a 87 y.o. female with history of hypertension, diabetes mellitus, emphysema was brought to the ER after patient was found on the floor after the patient's daughter went to have a routine visit this afternoon.  Patient states she fell yesterday probably after tripping on something and was unable to get up.  She did hit her head but did not lose consciousness.  ED Course: In the ER x-rays revealed left superior and inferior pubic rami fracture.  Patient also has a large ecchymotic area on the right neck area but CT scan of the head and neck was unremarkable.  EKG shows normal sinus rhythm with RBBB and nonspecific ST-T changes COVID test was negative.  Labs are significant for CK of 590 WBC of 13.5.  ER physician discussed with on-call orthopedic surgeon Dr. Roda Shutters will be seeing patient in consult.  Patient admitted for further management.  Review of Systems: As per HPI, rest all negative.   Past Medical History:  Diagnosis Date  . Diabetes mellitus   . Fracture 10/2012   left wrist  . Fracture of left clavicle    12/12 wore a sling  . GERD (gastroesophageal reflux disease)   . Hypertension   . Osteoporosis   . Pulmonary fibrosis (HCC)     Past Surgical History:  Procedure Laterality Date  . FEMUR FRACTURE SURGERY      6/08 partial hip replacement right  . ganglion cyst removal     left wrist 1960  . HIP FRACTURE SURGERY     1984 pin and plate placed right  . HIP SURGERY     pin and plate removed 01/2548  . ORIF ELBOW FRACTURE Right 02/18/2013   Procedure: OPEN REDUCTION INTERNAL FIXATION (ORIF) ELBOW/OLECRANON FRACTURE;  Surgeon: Nadara Mustard, MD;  Location: [facility] OR;  Service: Orthopedics;  Laterality: Right;  . ORIF ELBOW FRACTURE Left 05/09/2015   Procedure: OPEN REDUCTION INTERNAL  FIXATION (ORIF) LEFT MEDIAL HUMERAL CONDYLE FRACTURE;  Surgeon: Tarry Kos, MD;  Location: [facility] OR;  Service: Orthopedics;  Laterality: Left;     reports that she has never smoked. She has never used smokeless tobacco. She reports that she does not drink alcohol and does not use drugs.  Allergies  Allergen Reactions  . Hydrochlorothiazide Other (See Comments) and Itching    Taken from faxed notes of dr Wylene Simmer (to short stay)  THROMBOCYTOPENIA Taken from faxed notes of dr Wylene Simmer (to short stay)  THROMBOCYTOPENIA  . Prilosec [Omeprazole] Diarrhea    Family History  Problem Relation Age of Onset  . Pancreatic cancer Brother   . Stroke Mother   . Diabetes Father     Prior to Admission medications   Medication Sig Start Date End Date Taking? Authorizing Provider  acetaminophen (TYLENOL) 325 MG tablet Take 2 tablets (650 mg total) by mouth every 6 (six) hours as needed. Patient taking differently: Take 650 mg by mouth every 6 (six) hours as needed for mild pain, fever or headache. 04/20/13   Lucrezia Starch, NP  ADVAIR DISKUS 500-50 MCG/DOSE AEPB INHALE 1 PUFF INTO THE LUNGS TWICE DAILY 11/04/19   Mannam, Praveen, MD  amLODipine (NORVASC) 5 MG tablet Take 5 mg by mouth every other day.     [provider]  calcium-vitamin D (OSCAL WITH D) 500-200 MG-UNIT  tablet Take 1 tablet by mouth.    [provider]  Cholecalciferol (VITAMIN D PO) Take by mouth daily. 1 tab everyday    [provider]  HYDROcodone-acetaminophen (NORCO/VICODIN) 5-325 MG tablet Take 1 tablet by mouth every 4 (four) hours as needed for moderate pain. 11/15/20   Nadara Mustard, MD  HYDROcodone-homatropine [facility]) 5-1.5 MG/5ML syrup Take 5 mLs by mouth every 6 (six) hours as needed for cough. 04/11/20   Icard, Rachel Bo, DO  losartan (COZAAR) 50 MG tablet Take 50 mg by mouth daily.    [provider]  Multiple Vitamins-Minerals (MULTIVITAMIN WITH MINERALS) tablet Take 1 tablet by mouth  daily. 04/20/13   Lucrezia Starch, NP  nitrofurantoin, macrocrystal-monohydrate, (MACROBID) 100 MG capsule Take 100 mg by mouth 2 (two) times daily. 03/01/20   [provider]  NOVOFINE PLUS 32G X 4 MM MISC U UTD WITH TRESIBA 09/28/18   [provider]  ONETOUCH VERIO test strip USE TO SELF MONITOR BLOOD GLUCOSE 2 TIMES DAILY AND AS NEEDED 09/09/18   [provider]  pantoprazole (PROTONIX) 40 MG tablet Take 1 tablet (40 mg total) by mouth 2 (two) times daily. Patient taking differently: Take 40 mg by mouth daily. 09/20/15   Michele Mcalpine, MD  traMADol Janean Sark) 50 MG tablet Take 1/2-1 tablet three times daily as needed 12/28/20   Tarry Kos, MD  TRESIBA FLEXTOUCH 100 UNIT/ML SOPN FlexTouch Pen  11/11/18   [provider]  triamcinolone acetonide (TRIESENCE) 40 MG/ML SUSP Inject into the articular space. 12/03/18   [provider]  UNABLE TO FIND USE TO SELF MONITOR BLOOD GLUCOSE 2 TIMES DAILY AND AS NEEDED 09/09/18   [provider]    Physical Exam: Constitutional: Moderately built and nourished. Vitals:   04/11/21 1736 04/11/21 1805 04/11/21 1930 04/11/21 2045  BP: (!) 146/75 138/70 (!) 147/83 (!) 168/77  Pulse: 95 92 90 (!) 101  Resp: (!) 21 (!) 27 18 (!) 22  Temp: 98.7 F (37.1 C)     TempSrc: Oral     SpO2: 94% 92% 92% 90%  Weight:      Height:       Eyes: Anicteric no pallor. ENMT: No discharge from the ears eyes nose and mouth. Neck: Right neck ecchymotic area.  No neck rigidity. Respiratory: No rhonchi or crepitations. Cardiovascular: S1-S2 heard. Abdomen: Soft nontender bowel sounds present. Musculoskeletal: Pain on moving left hip. Skin: Ecchymotic area in the right neck area. Neurologic: Alert awake oriented to time place and person.  Moves all extremities. Psychiatric: Appears normal.  Normal affect.   Labs on Admission: I have personally reviewed following labs and imaging studies  CBC: Recent Labs  Lab  04/11/21 1753  WBC 13.5*  NEUTROABS 11.0*  HGB 13.9  HCT 42.0  MCV 92.1  PLT 97*   Basic Metabolic Panel: Recent Labs  Lab 04/11/21 1753  NA 131*  K 4.3  CL 97*  CO2 27  GLUCOSE 179*  BUN 12  CREATININE 0.92  CALCIUM 10.0   GFR: Estimated Creatinine Clearance: 30.9 mL/min (by C-G formula based on SCr of 0.92 mg/dL). Liver Function Tests: Recent Labs  Lab 04/11/21 1753  AST 55*  ALT 29  ALKPHOS 116  BILITOT 4.2*  PROT 7.0  ALBUMIN 3.6   No results for input(s): LIPASE, AMYLASE in the last 168 hours. No results for input(s): AMMONIA in the last 168 hours. Coagulation Profile: No results for input(s): INR, PROTIME in the last 168  hours. Cardiac Enzymes: Recent Labs  Lab 04/11/21 1753  CKTOTAL 590*   BNP (last 3 results) No results for input(s): PROBNP in the last 8760 hours. HbA1C: No results for input(s): HGBA1C in the last 72 hours. CBG: No results for input(s): GLUCAP in the last 168 hours. Lipid Profile: No results for input(s): CHOL, HDL, LDLCALC, TRIG, CHOLHDL, LDLDIRECT in the last 72 hours. Thyroid Function Tests: No results for input(s): TSH, T4TOTAL, FREET4, T3FREE, THYROIDAB in the last 72 hours. Anemia Panel: No results for input(s): VITAMINB12, FOLATE, FERRITIN, TIBC, IRON, RETICCTPCT in the last 72 hours. Urine analysis:    Component Value Date/Time   COLORURINE YELLOW 04/11/2021 1754   APPEARANCEUR CLEAR 04/11/2021 1754   LABSPEC 1.015 04/11/2021 1754   PHURINE 7.0 04/11/2021 1754   GLUCOSEU 50 (A) 04/11/2021 1754   HGBUR MODERATE (A) 04/11/2021 1754   BILIRUBINUR NEGATIVE 04/11/2021 1754   KETONESUR NEGATIVE 04/11/2021 1754   PROTEINUR NEGATIVE 04/11/2021 1754   UROBILINOGEN 0.2 02/17/2013 1933   NITRITE NEGATIVE 04/11/2021 1754   LEUKOCYTESUR NEGATIVE 04/11/2021 1754   Sepsis Labs: @LABRCNTIP (procalcitonin:4,lacticidven:4) )No results found for this or any previous visit (from the past 240 hour(s)).   Radiological Exams on  Admission: DG Chest 1 View  Result Date: 04/11/2021 CLINICAL DATA:  Left hip pain following a fall last night. EXAM: CHEST  1 VIEW COMPARISON:  12/28/2020 FINDINGS: Interval mildly enlarged cardiac silhouette. Tortuous aorta. Stable bilateral interstitial fibrosis. Diffuse osteopenia. Interval increased compression deformity of the T12 vertebral body. Stable T10 vertebral body compression deformity. Possible interval T8 vertebral body compression deformity. Old, healed left posterior 2nd rib fracture. IMPRESSION: 1. Interval mild cardiomegaly. 2. Stable interstitial fibrosis. 3. Progressive thoracolumbar vertebral compression deformities. Electronically Signed   By: 12/30/2020 M.D.   On: 04/11/2021 18:52   CT Head Wo Contrast  Result Date: 04/11/2021 CLINICAL DATA:  06/11/2021 last night.  Subsequent mental status changes. EXAM: CT HEAD WITHOUT CONTRAST CT CERVICAL SPINE WITHOUT CONTRAST TECHNIQUE: Multidetector CT imaging of the head and cervical spine was performed following the standard protocol without intravenous contrast. Multiplanar CT image reconstructions of the cervical spine were also generated. COMPARISON:  02/17/2013 FINDINGS: CT HEAD FINDINGS Brain: Mild age related volume loss. Mild chronic small-vessel change of the white matter. No sign of acute infarction, mass lesion, hemorrhage, hydrocephalus or extra-axial collection. Vascular: There is atherosclerotic calcification of the major vessels at the base of the brain. Skull: Negative Sinuses/Orbits: Clear except for a tiny amount of fluid layering in the right maxillary sinus, not likely significant. Other: None CT CERVICAL SPINE FINDINGS Alignment: Normal alignment. Skull base and vertebrae: No regional fracture or focal bone lesion. Soft tissues and spinal canal: Negative Disc levels: Mild spondylosis and facet osteoarthritis. No apparent compressive stenosis of the canal or foramina. Upper chest: Emphysema and scarring. Other: None IMPRESSION:  Head CT: No acute or traumatic finding. Mild age related volume loss and small-vessel change of the white matter. Cervical spine CT: No acute or traumatic finding. Ordinary mild spondylosis and facet osteoarthritis. Emphysema (ICD10-J43.9). Electronically Signed   By: 02/19/2013 M.D.   On: 04/11/2021 18:59   CT Cervical Spine Wo Contrast  Result Date: 04/11/2021 CLINICAL DATA:  06/11/2021 last night.  Subsequent mental status changes. EXAM: CT HEAD WITHOUT CONTRAST CT CERVICAL SPINE WITHOUT CONTRAST TECHNIQUE: Multidetector CT imaging of the head and cervical spine was performed following the standard protocol without intravenous contrast. Multiplanar CT image reconstructions of the cervical spine were also generated. COMPARISON:  02/17/2013 FINDINGS: CT HEAD FINDINGS Brain: Mild age related volume loss. Mild chronic small-vessel change of the white matter. No sign of acute infarction, mass lesion, hemorrhage, hydrocephalus or extra-axial collection. Vascular: There is atherosclerotic calcification of the major vessels at the base of the brain. Skull: Negative Sinuses/Orbits: Clear except for a tiny amount of fluid layering in the right maxillary sinus, not likely significant. Other: None CT CERVICAL SPINE FINDINGS Alignment: Normal alignment. Skull base and vertebrae: No regional fracture or focal bone lesion. Soft tissues and spinal canal: Negative Disc levels: Mild spondylosis and facet osteoarthritis. No apparent compressive stenosis of the canal or foramina. Upper chest: Emphysema and scarring. Other: None IMPRESSION: Head CT: No acute or traumatic finding. Mild age related volume loss and small-vessel change of the white matter. Cervical spine CT: No acute or traumatic finding. Ordinary mild spondylosis and facet osteoarthritis. Emphysema (ICD10-J43.9). Electronically Signed   By: Paulina FusiMark  Shogry M.D.   On: 04/11/2021 18:59   DG Hip Unilat W or Wo Pelvis 2-3 Views Left  Result Date: 04/11/2021 CLINICAL DATA:   Left hip pain following a fall last night. EXAM: DG HIP (WITH OR WITHOUT PELVIS) 2-3V LEFT COMPARISON:  Pelvis and right hip dated 05/09/2019. Abdomen and pelvis CT dated 10/04/2020. FINDINGS: Stable old, possible acute left inferior pubic ramus fracture at the location of a previously demonstrated old, healed fracture. There is also an acute, mildly comminuted and mildly displaced fracture of the left superior pubic ramus. No hip fracture or dislocation seen. Stable right hip prosthesis. Diffuse osteopenia. IMPRESSION: 1. Left superior and inferior pubic ramus fractures. 2. No hip fracture or dislocation. Electronically Signed   By: Beckie SaltsSteven  Reid M.D.   On: 04/11/2021 18:48    EKG: Independently reviewed.  Normal sinus rhythm with RBBB nonspecific ST changes.  Assessment/Plan Principal Problem:   Pelvic fracture (HCC) Active Problems:   Essential hypertension   IPF (idiopathic pulmonary fibrosis) (HCC)   Diabetes mellitus type 2 in nonobese (HCC)    1. Left superior and inferior pubic rami fracture status post mechanical fall presently on pain medication muscle relaxant and get physical therapy and orthopedic surgeon Dr. Roda ShuttersXu will be seeing patient in consult. 2. Large ecchymotic area in the right neck area for which I have ordered MR angiogram of the neck and also MRI C-spine. 3. History of idiopathic pulmonary fibrosis/emphysema on inhalers. 4. Hypertension on losartan. 5. Diabetes mellitus type 2 on sliding scale coverage. 6. Leukocytosis likely reactionary. 7. Elevated CK levels likely from fall gently hydrate follow CK levels.   DVT prophylaxis: SCDs for now.  If MRI is unremarkable place patient on Lovenox if orthopedics not planning any surgery. Code Status: Full code. Family Communication: Patient's daughter at the bedside. Disposition Plan: May need rehab. Consults called: Orthopedics. Admission status: Observation.   Eduard ClosArshad N Donyelle Enyeart MD [facility] Pager [phone]  [phone].  If 7PM-7AM, please contact night-coverage [email or web address] Password [email or web address]  04/11/2021, 9:36 PM

## 2021-04-11 NOTE — ED Provider Notes (Signed)
[facility] EMERGENCY DEPARTMENT Provider Note   CSN: [id number] Arrival date & time: 04/11/21  1727     History Chief Complaint  Patient presents with  . Fall    Lauren Avery is a 87 y.o. female.  HPI    87 year old female with history of diabetes comes in with chief complaint of fall.  Patient lives by herself and had a mechanical fall around 10 PM last night when she was ready to go to bed.  She fell right by her front door.  Patient was unable to get up, and remained on the floor until her daughters found her in that state in her house this evening.  Patient is noted to have bruise to her chest, abrasion to her elbow.  She reports that she laid on her right side because of pain on her left hip area.  Patient has no chest pain, shortness of breath but does indicate having some pain between her shoulder blades.   Patient recalls a fall and denies any LOC, nausea, vomiting, she also denies any focal numbness, weakness, vision change, dizziness.  Past Medical History:  Diagnosis Date  . Diabetes mellitus   . Fracture 10/2012   left wrist  . Fracture of left clavicle    12/12 wore a sling  . GERD (gastroesophageal reflux disease)   . Hypertension   . Osteoporosis   . Pulmonary fibrosis [facility])     Patient Active Problem List   Diagnosis Date Noted  . Coagulation disorder (HCC) 05/02/2020  . Physical deconditioning 09/23/2017  . Laryngopharyngeal reflux (LPR) 09/20/2015  . Fracture of elbow, medial condyle, left, closed 05/09/2015  . Fracture of medial condyle of elbow 05/09/2015  . Long term (current) use of anticoagulants 03/21/2013  . GERD (gastroesophageal reflux disease) 02/24/2013  . Constipation 02/24/2013  . Fracture of elbow, medial condyle, right, closed 02/19/2013    Class: Acute  . Closed fracture of single pubic ramus of pelvis (HCC) 02/19/2013    Class: Acute  . Bilateral sacral insufficiency fracture 02/19/2013    Class:  Chronic  . IPF (idiopathic pulmonary fibrosis) (HCC) 02/24/2012  . Chronic cough 02/24/2012  . DM 11/12/2007  . Essential hypertension 11/12/2007    Past Surgical History:  Procedure Laterality Date  . FEMUR FRACTURE SURGERY      6/08 partial hip replacement right  . ganglion cyst removal     left wrist 1960  . HIP FRACTURE SURGERY     1984 pin and plate placed right  . HIP SURGERY     pin and plate removed 01/2296  . ORIF ELBOW FRACTURE Right 02/18/2013   Procedure: OPEN REDUCTION INTERNAL FIXATION (ORIF) ELBOW/OLECRANON FRACTURE;  Surgeon: Nadara Mustard, MD;  Location: [facility] OR;  Service: Orthopedics;  Laterality: Right;  . ORIF ELBOW FRACTURE Left 05/09/2015   Procedure: OPEN REDUCTION INTERNAL FIXATION (ORIF) LEFT MEDIAL HUMERAL CONDYLE FRACTURE;  Surgeon: Tarry Kos, MD;  Location: [facility] OR;  Service: Orthopedics;  Laterality: Left;     OB History   No obstetric history on file.     Family History  Problem Relation Age of Onset  . Pancreatic cancer Brother   . Stroke Mother   . Diabetes Father     Social History   Tobacco Use  . Smoking status: Never Smoker  . Smokeless tobacco: Never Used  Substance Use Topics  . Alcohol use: No  . Drug use: No    Home Medications Prior to Admission medications  Medication Sig Start Date End Date Taking? Authorizing Provider  acetaminophen (TYLENOL) 325 MG tablet Take 2 tablets (650 mg total) by mouth every 6 (six) hours as needed. Patient taking differently: Take 650 mg by mouth every 6 (six) hours as needed for mild pain, fever or headache. 04/20/13   Lucrezia Starch, NP  ADVAIR DISKUS 500-50 MCG/DOSE AEPB INHALE 1 PUFF INTO THE LUNGS TWICE DAILY 11/04/19   Mannam, Praveen, MD  amLODipine (NORVASC) 5 MG tablet Take 5 mg by mouth every other day.     [provider]  calcium-vitamin D (OSCAL WITH D) 500-200 MG-UNIT tablet Take 1 tablet by mouth.    [provider]  Cholecalciferol (VITAMIN D PO) Take by mouth daily.  1 tab everyday    [provider]  HYDROcodone-acetaminophen (NORCO/VICODIN) 5-325 MG tablet Take 1 tablet by mouth every 4 (four) hours as needed for moderate pain. 11/15/20   Nadara Mustard, MD  HYDROcodone-homatropine [facility]) 5-1.5 MG/5ML syrup Take 5 mLs by mouth every 6 (six) hours as needed for cough. 04/11/20   Icard, Rachel Bo, DO  losartan (COZAAR) 50 MG tablet Take 50 mg by mouth daily.    [provider]  Multiple Vitamins-Minerals (MULTIVITAMIN WITH MINERALS) tablet Take 1 tablet by mouth daily. 04/20/13   Lucrezia Starch, NP  nitrofurantoin, macrocrystal-monohydrate, (MACROBID) 100 MG capsule Take 100 mg by mouth 2 (two) times daily. 03/01/20   [provider]  NOVOFINE PLUS 32G X 4 MM MISC U UTD WITH TRESIBA 09/28/18   [provider]  ONETOUCH VERIO test strip USE TO SELF MONITOR BLOOD GLUCOSE 2 TIMES DAILY AND AS NEEDED 09/09/18   [provider]  pantoprazole (PROTONIX) 40 MG tablet Take 1 tablet (40 mg total) by mouth 2 (two) times daily. Patient taking differently: Take 40 mg by mouth daily. 09/20/15   Michele Mcalpine, MD  traMADol Janean Sark) 50 MG tablet Take 1/2-1 tablet three times daily as needed 12/28/20   Tarry Kos, MD  TRESIBA FLEXTOUCH 100 UNIT/ML SOPN FlexTouch Pen  11/11/18   [provider]  triamcinolone acetonide (TRIESENCE) 40 MG/ML SUSP Inject into the articular space. 12/03/18   [provider]  UNABLE TO FIND USE TO SELF MONITOR BLOOD GLUCOSE 2 TIMES DAILY AND AS NEEDED 09/09/18   [provider]    Allergies    Hydrochlorothiazide and Prilosec [omeprazole]  Review of Systems   Review of Systems  Constitutional: Positive for activity change.  Cardiovascular: Negative for chest pain.  Gastrointestinal: Negative for nausea and vomiting.  Skin: Positive for wound.  Allergic/Immunologic: Negative for immunocompromised state.  Hematological: Does not bruise/bleed easily.  All other systems  reviewed and are negative.   Physical Exam Updated Vital Signs BP (!) 147/83   Pulse 90   Temp 98.7 F (37.1 C) (Oral)   Resp 18   Ht 5\' 2"  (1.575 m)   Wt 51.2 kg   SpO2 92%   BMI 20.65 kg/m   Physical Exam Vitals and nursing note reviewed.  Constitutional:      Appearance: She is well-developed.  HENT:     Head: Normocephalic and atraumatic.  Neck:     Comments: In a c-collar, patient has diffuse C-spine tenderness, over the midline over the lower C-spine and paraspinal region diffusely Cardiovascular:     Rate and Rhythm: Normal rate.  Pulmonary:     Effort: Pulmonary effort is normal.  Abdominal:     General: Bowel sounds are normal.  Musculoskeletal:  General: Tenderness present. No deformity.     Comments: Tenderness over the pelvis region, left gluteal region.  Patient able to raise both of her legs, although there is discomfort with raising the left lower extremity.  Patient also has mild discomfort with shoulder movement, but she is able to forward flex and extend  Patient has couple of areas of tenderness over the thoracic and upper lumbar spine  Skin:    General: Skin is warm and dry.     Findings: Bruising and lesion present.     Comments: Large ecchymosis over the anterior aspect of the neck going all the way towards the supraclavicular region  Neurological:     Mental Status: She is alert and oriented to person, place, and time.     ED Results / Procedures / Treatments   Labs (all labs ordered are listed, but only abnormal results are displayed) Labs Reviewed  CBC WITH DIFFERENTIAL/PLATELET - Abnormal; Notable for the following components:      Result Value   WBC 13.5 (*)    Platelets 97 (*)    Neutro Abs 11.0 (*)    Monocytes Absolute 1.1 (*)    Abs Immature Granulocytes 0.13 (*)    All other components within normal limits  COMPREHENSIVE METABOLIC PANEL - Abnormal; Notable for the following components:   Sodium 131 (*)    Chloride 97  (*)    Glucose, Bld 179 (*)    AST 55 (*)    Total Bilirubin 4.2 (*)    GFR, Estimated 58 (*)    All other components within normal limits  URINALYSIS, ROUTINE W REFLEX MICROSCOPIC - Abnormal; Notable for the following components:   Glucose, UA 50 (*)    Hgb urine dipstick MODERATE (*)    All other components within normal limits  CK - Abnormal; Notable for the following components:   Total CK 590 (*)    All other components within normal limits  SARS CORONAVIRUS 2 (TAT 6-24 HRS)    EKG None  ED ECG REPORT   Date: 04/11/2021  Rate: 87  Rhythm: normal sinus rhythm  QRS Axis: normal  Intervals: normal  ST/T Wave abnormalities: nonspecific ST/T changes  Conduction Disutrbances:none  Narrative Interpretation:   Old EKG Reviewed: none available  I have personally reviewed the EKG tracing and agree with the computerized printout as noted.  Radiology DG Chest 1 View  Result Date: 04/11/2021 CLINICAL DATA:  Left hip pain following a fall last night. EXAM: CHEST  1 VIEW COMPARISON:  12/28/2020 FINDINGS: Interval mildly enlarged cardiac silhouette. Tortuous aorta. Stable bilateral interstitial fibrosis. Diffuse osteopenia. Interval increased compression deformity of the T12 vertebral body. Stable T10 vertebral body compression deformity. Possible interval T8 vertebral body compression deformity. Old, healed left posterior 2nd rib fracture. IMPRESSION: 1. Interval mild cardiomegaly. 2. Stable interstitial fibrosis. 3. Progressive thoracolumbar vertebral compression deformities. Electronically Signed   By: Beckie SaltsSteven  Reid M.D.   On: 04/11/2021 18:52   CT Head Wo Contrast  Result Date: 04/11/2021 CLINICAL DATA:  Larey SeatFell last night.  Subsequent mental status changes. EXAM: CT HEAD WITHOUT CONTRAST CT CERVICAL SPINE WITHOUT CONTRAST TECHNIQUE: Multidetector CT imaging of the head and cervical spine was performed following the standard protocol without intravenous contrast. Multiplanar CT image  reconstructions of the cervical spine were also generated. COMPARISON:  02/17/2013 FINDINGS: CT HEAD FINDINGS Brain: Mild age related volume loss. Mild chronic small-vessel change of the white matter. No sign of acute infarction, mass lesion, hemorrhage, hydrocephalus or  extra-axial collection. Vascular: There is atherosclerotic calcification of the major vessels at the base of the brain. Skull: Negative Sinuses/Orbits: Clear except for a tiny amount of fluid layering in the right maxillary sinus, not likely significant. Other: None CT CERVICAL SPINE FINDINGS Alignment: Normal alignment. Skull base and vertebrae: No regional fracture or focal bone lesion. Soft tissues and spinal canal: Negative Disc levels: Mild spondylosis and facet osteoarthritis. No apparent compressive stenosis of the canal or foramina. Upper chest: Emphysema and scarring. Other: None IMPRESSION: Head CT: No acute or traumatic finding. Mild age related volume loss and small-vessel change of the white matter. Cervical spine CT: No acute or traumatic finding. Ordinary mild spondylosis and facet osteoarthritis. Emphysema (ICD10-J43.9). Electronically Signed   By: Paulina Fusi M.D.   On: 04/11/2021 18:59   CT Cervical Spine Wo Contrast  Result Date: 04/11/2021 CLINICAL DATA:  Larey Seat last night.  Subsequent mental status changes. EXAM: CT HEAD WITHOUT CONTRAST CT CERVICAL SPINE WITHOUT CONTRAST TECHNIQUE: Multidetector CT imaging of the head and cervical spine was performed following the standard protocol without intravenous contrast. Multiplanar CT image reconstructions of the cervical spine were also generated. COMPARISON:  02/17/2013 FINDINGS: CT HEAD FINDINGS Brain: Mild age related volume loss. Mild chronic small-vessel change of the white matter. No sign of acute infarction, mass lesion, hemorrhage, hydrocephalus or extra-axial collection. Vascular: There is atherosclerotic calcification of the major vessels at the base of the brain. Skull:  Negative Sinuses/Orbits: Clear except for a tiny amount of fluid layering in the right maxillary sinus, not likely significant. Other: None CT CERVICAL SPINE FINDINGS Alignment: Normal alignment. Skull base and vertebrae: No regional fracture or focal bone lesion. Soft tissues and spinal canal: Negative Disc levels: Mild spondylosis and facet osteoarthritis. No apparent compressive stenosis of the canal or foramina. Upper chest: Emphysema and scarring. Other: None IMPRESSION: Head CT: No acute or traumatic finding. Mild age related volume loss and small-vessel change of the white matter. Cervical spine CT: No acute or traumatic finding. Ordinary mild spondylosis and facet osteoarthritis. Emphysema (ICD10-J43.9). Electronically Signed   By: Paulina Fusi M.D.   On: 04/11/2021 18:59   DG Hip Unilat W or Wo Pelvis 2-3 Views Left  Result Date: 04/11/2021 CLINICAL DATA:  Left hip pain following a fall last night. EXAM: DG HIP (WITH OR WITHOUT PELVIS) 2-3V LEFT COMPARISON:  Pelvis and right hip dated 05/09/2019. Abdomen and pelvis CT dated 10/04/2020. FINDINGS: Stable old, possible acute left inferior pubic ramus fracture at the location of a previously demonstrated old, healed fracture. There is also an acute, mildly comminuted and mildly displaced fracture of the left superior pubic ramus. No hip fracture or dislocation seen. Stable right hip prosthesis. Diffuse osteopenia. IMPRESSION: 1. Left superior and inferior pubic ramus fractures. 2. No hip fracture or dislocation. Electronically Signed   By: Beckie Salts M.D.   On: 04/11/2021 18:48    Procedures Procedures   Medications Ordered in ED Medications  lactated ringers bolus 1,000 mL (1,000 mLs Intravenous New Bag/Given 04/11/21 1833)  Tdap (BOOSTRIX) injection 0.5 mL (0.5 mLs Intramuscular Given 04/11/21 1835)    ED Course  I have reviewed the triage vital signs and the nursing notes.  Pertinent labs & imaging results that were available during my  care of the patient were reviewed by me and considered in my medical decision making (see chart for details).    MDM Rules/Calculators/A&P  87 year old female comes in with chief complaint of fall.  She has history of pulmonary fibrosis and diabetes but is independent and quite sharp.  She was on the floor for more than 18 hours, CK ordered to evaluate for rhabdo.  Patient has large ecchymosis over her neck anteriorly.  No evidence of bruit on the neck exam.  She is not on any blood thinners.  It does not appear that the hematoma is expanding given that she has injured herself now more than 16 or 18 hours ago.  She has neck pain, but she has no meningismus and she has no focal neurodeficits.  I doubt that she has vertebral or carotid dissection, however I did discuss this possible concern with Dr. Toniann Fail given significant ecchymosis she has, and they will consider getting ultrasound Doppler if needed.  Additionally, patient is having tenderness over the pelvic region.  She is able to lift her lower extremities bilaterally which is reassuring.  We will get appropriate imaging which would include CT head, C-spine, chest x-ray and pelvis x-ray.  9:39 PM Patient's work-up reveals that she has multiple pelvic fractures.  She has remained hemodynamically stable, outside of mild tachycardia.  Shock index is < 1.  There is no evidence of any ecchymosis over the pelvic region.  Doubt vascular injury there and at this time my suspicion is low for occult hip fracture.  I discussed the case with Dr. Deno Etienne, orthopedics.  They will see the patient. Requesting medicine to admit her.  Based on PT/OT evaluation, they might need to consider CT pelvis to rule out occult fracture if patient's pain is out of proportion.  Final Clinical Impression(s) / ED Diagnoses Final diagnoses:  Multiple closed fractures of pelvis without disruption of pelvic ring, initial encounter (HCC)  Elevated CK     Rx / DC Orders ED Discharge Orders    None       Derwood Kaplan, MD 04/11/21 2140

## 2021-04-11 NOTE — ED Triage Notes (Signed)
Pt BIB [facility] after suffering a fall last night at 2200 hours. Pt was walking with walker, tripped over the walker and fell. Pt was unable to reach her phone to alert anyone, so patient has been on the floor since 2200 hours yesterday until about 45 minutes ago when her daughters found her. Pt does have extensive bruising to her anterior neck, bruising to L shoulder,chest area, L knee, redness to R hip, and abrasion to L elbow. Pt remembers entire fall, no LOC, no blood thinner. Pt complaining of R hip pain and L shoulder pain. No shortening or rotation. VSS per EMS.

## 2021-04-12 ENCOUNTER — Observation Stay (HOSPITAL_COMMUNITY): Payer: Medicare Other

## 2021-04-12 DIAGNOSIS — I10 Essential (primary) hypertension: Secondary | ICD-10-CM

## 2021-04-12 DIAGNOSIS — J84112 Idiopathic pulmonary fibrosis: Secondary | ICD-10-CM

## 2021-04-12 DIAGNOSIS — Z043 Encounter for examination and observation following other accident: Secondary | ICD-10-CM | POA: Diagnosis not present

## 2021-04-12 DIAGNOSIS — S329XXA Fracture of unspecified parts of lumbosacral spine and pelvis, initial encounter for closed fracture: Secondary | ICD-10-CM | POA: Diagnosis not present

## 2021-04-12 DIAGNOSIS — M47813 Spondylosis without myelopathy or radiculopathy, cervicothoracic region: Secondary | ICD-10-CM | POA: Diagnosis not present

## 2021-04-12 DIAGNOSIS — E119 Type 2 diabetes mellitus without complications: Secondary | ICD-10-CM | POA: Diagnosis not present

## 2021-04-12 LAB — CBC
HCT: 38.4 % (ref 36.0–46.0)
Hemoglobin: 12.8 g/dL (ref 12.0–15.0)
MCH: 29.9 pg (ref 26.0–34.0)
MCHC: 33.3 g/dL (ref 30.0–36.0)
MCV: 89.7 fL (ref 80.0–100.0)
Platelets: 85 10*3/uL — ABNORMAL LOW (ref 150–400)
RBC: 4.28 MIL/uL (ref 3.87–5.11)
RDW: 14.6 % (ref 11.5–15.5)
WBC: 14.6 10*3/uL — ABNORMAL HIGH (ref 4.0–10.5)
nRBC: 0 % (ref 0.0–0.2)

## 2021-04-12 LAB — GLUCOSE, CAPILLARY
Glucose-Capillary: 207 mg/dL — ABNORMAL HIGH (ref 70–99)
Glucose-Capillary: 185 mg/dL — ABNORMAL HIGH (ref 70–99)
Glucose-Capillary: 290 mg/dL — ABNORMAL HIGH (ref 70–99)
Glucose-Capillary: 154 mg/dL — ABNORMAL HIGH (ref 70–99)

## 2021-04-12 LAB — TROPONIN I (HIGH SENSITIVITY): Troponin I (High Sensitivity): 23 ng/L — ABNORMAL HIGH (ref <18)

## 2021-04-12 LAB — SARS CORONAVIRUS 2 (TAT 6-24 HRS): SARS Coronavirus 2: NEGATIVE

## 2021-04-12 LAB — BASIC METABOLIC PANEL
Anion gap: 9 (ref 5–15)
BUN: 15 mg/dL (ref 8–23)
CO2: 24 mmol/L (ref 22–32)
Calcium: 9.7 mg/dL (ref 8.9–10.3)
Chloride: 100 mmol/L (ref 98–111)
Creatinine, Ser: 0.82 mg/dL (ref 0.44–1.00)
GFR, Estimated: >60 mL/min (ref >60)
Glucose, Bld: 163 mg/dL — ABNORMAL HIGH (ref 70–99)
Potassium: 4.1 mmol/L (ref 3.5–5.1)
Sodium: 133 mmol/L — ABNORMAL LOW (ref 135–145)

## 2021-04-12 LAB — CK: Total CK: 375 U/L — ABNORMAL HIGH (ref 38–234)

## 2021-04-12 MED ORDER — GADOBUTROL 1 MMOL/ML IV SOLN
5.0000 mL | Freq: Once | INTRAVENOUS | Status: AC | PRN
  Administered 2021-04-12: 5 mL via INTRAVENOUS

## 2021-04-12 NOTE — Progress Notes (Signed)
Modified Barium Swallow Progress Note  Patient Details  Name: Lauren Avery MRN: [id number] Date of Birth: Sep 24, 1927  Today's Date: 04/12/2021  Modified Barium Swallow completed.  Full report located under Chart Review in the Imaging Section.  Brief recommendations include the following:  Clinical Impression  Pt presents with pharyngeal dysphagia characterized by mildly reduced lingual retraction and a pharyngeal delay. Pt demonstrated vallecular residue which increased with bolus size and with advancement of solids. Vallecular residue was reduced and sometimes eliminated with a  liquid wash. The pharyngeal delay resulted in penetration (PAS 3, 5) of thin liquids during deglutition and aspiration (PAS  7) after deglutition. Penetration (PAS 3) was also noted with nectar thick liquids via straw. A chin tuck posture was effective in eliminating instances of penetration and aspiration, but this was most effective with thin liquids via cup. A regular texture diet with thin liquids via cup is recommended at this time. SLP will follow to assess diet tolerance and for treatment.   Swallow Evaluation Recommendations       SLP Diet Recommendations: Regular solids;Thin liquid   Liquid Administration via: Cup;No straw   Medication Administration: Whole meds with puree   Supervision: Full supervision/cueing for compensatory strategies;Patient able to self feed   Compensations: Slow rate;Small sips/bites;Chin tuck   Postural Changes: Seated upright at 90 degrees   Oral Care Recommendations: Oral care BID       Lauren Avery Lauren. Vear Clock, Lauren Avery, Lauren Avery Acute Rehabilitation Services Office number [phone] Pager [phone]  Lauren Avery 04/12/2021,2:49 PM

## 2021-04-12 NOTE — Consult Note (Signed)
I saw Ms. Krzyzanowski today and her daughter was at bedside.  Her daughter is very upset about her mother's recent fall and hospitalization.  The patient is quite confused this morning.  The daughter states her mother has been declining recently.    Her orthopedic exam is unremarkable other than pain with palpation of the pelvic brim and lateral compression of the pelvis.  She is able to move her ankle and toes on command.    I went over the xrays with her daughter and explained that the fractures will not need to be surgically repair as that should be stable and heal.  They understand that pain with weight bearing is to be expected.  She may mobilize with PT as her pain allows.  All questions answered.  I am available if they have any questions or concerns.

## 2021-04-12 NOTE — Progress Notes (Signed)
Dulera not available. Rx paged.

## 2021-04-12 NOTE — Progress Notes (Signed)
  Speech Language Pathology Treatment: Dysphagia  Patient Details Name: Lauren Avery MRN: [id number] DOB: January 16, 1927 Today's Date: 04/12/2021 Time: 5400-8676 SLP Time Calculation (min) (ACUTE ONLY): 17 min  Assessment / Plan / Recommendation Clinical Impression  Pt was seen for dysphagia treatment with her daughter present. Pt and her daughter were educated regarding the results of the modified barium swallow study, diet recommendations, and swallowing precautions. Video recording of the study was used to facilitate education and both parties verbalized understanding regarding all areas of education. Pt was able to demonstrate understanding of areas of education using teach back and use of the chin tuck posture. Both parties verbalized agreement with SLP services for dysphagia treatment after discharge. SLP will continue to follow pt.   HPI HPI: Pt is a 87 y.o. female who was brought to the ED after being found on the floor from fall.  Pt hit her head but did not lose consciousness. MRA5/12: Grossly negative. Head CT 5/12: No acute or traumatic finding. SLP was consulted after significant coughing event with intake of thin liquids. PMH: hypertension, diabetes mellitus, emphysema, GERD (on protonix).      SLP Plan  Continue with current plan of care       Recommendations  Diet recommendations: Regular;Thin liquid Liquids provided via: Cup;No straw Medication Administration: Whole meds with puree Supervision: Full supervision/cueing for compensatory strategies Compensations: Slow rate;Small sips/bites;Chin tuck Postural Changes and/or Swallow Maneuvers: Seated upright 90 degrees;Chin tuck                Oral Care Recommendations: Oral care BID;Staff/trained caregiver to provide oral care Follow up Recommendations:  (Continued SLP services at level of care recommended by PT/OT) SLP Visit Diagnosis: Dysphagia, pharyngeal phase (R13.13) Plan: Continue with current plan of  care       Lauren Larsson I. Vear Clock, MS, CCC-SLP Acute Rehabilitation Services Office number [phone] Pager [phone]                Lauren Avery 04/12/2021, 3:33 PM

## 2021-04-12 NOTE — Plan of Care (Signed)

## 2021-04-12 NOTE — TOC CAGE-AID Note (Signed)
Transition of Care [facility]) - CAGE-AID Screening   Patient Details  Name: Lauren Avery MRN: [id number] Date of Birth: 08-Aug-1927  Clinical Narrative:  Patient denies any alcohol or drug use.  CAGE-AID Screening:    Have You Ever Felt You Ought to Cut Down on Your Drinking or Drug Use?: No Have People Annoyed You By Critizing Your Drinking Or Drug Use?: No Have You Felt Bad Or Guilty About Your Drinking Or Drug Use?: No Have You Ever Had a Drink or Used Drugs First Thing In The Morning to Steady Your Nerves or to Get Rid of a Hangover?: No CAGE-AID Score: 0  Substance Abuse Education Offered: No (Patient denies current alcohol or drug use)

## 2021-04-12 NOTE — Evaluation (Signed)
Physical Therapy Evaluation Patient Details Name: Lauren Avery MRN: [id number] DOB: [birth year] Today's Date: 04/12/2021   History of Present Illness  Patient is a 87 y/o female who presents with left sup/inf pubic rami fx s/p fall. PMH includes HTN, pulmonary fibrosis, DM, osteoporosis, ORIF left humerus.  Clinical Impression  Patient presents with generalized weakness, impaired balance, pain, decreased activity tolerance and impaired mobility s/p above. Pt lives at home alone and has an aide/sitter come in 3 days/week for 6 hours to assist with some ADLs, mostly IADLs/meal prep etc. Pt has supportive children that assist as well. Pt uses rollator for ambulation. Hx of falls. Today, pt requires max A for bed mobility and Min-Mod A for standing from all surfaces. Pt with poor standing tolerance due to fatigue and bil knee instability standing multiple times from [facility] for pericare. Incontinent of stool/urine upon standing. Would benefit from SNF to maximize independence and mobility prior to return home. Will follow acutely.    Follow Up Recommendations SNF;Supervision for mobility/OOB    Equipment Recommendations  None recommended by PT    Recommendations for Other Services       Precautions / Restrictions Precautions Precautions: Fall Restrictions Weight Bearing Restrictions: No      Mobility  Bed Mobility Overal bed mobility: Needs Assistance Bed Mobility: Rolling;Sidelying to Sit Rolling: Mod assist Sidelying to sit: HOB elevated;Max assist       General bed mobility comments: ASsist to bring LEs to EOB and to elevate trunk; able to assist with scooting bottom to EOB. Increased time/effort. Kyphotic posture.    Transfers Overall transfer level: Needs assistance Equipment used: Rolling walker (2 wheeled) Transfers: Sit to/from Stand Sit to Stand: Min assist;Mod assist         General transfer comment: MIn-Mod A to stand from EOB x2, from BSC x5, SPT bed to [facility]  with Mod A for balance and RW management. Increased time/difficulty due to Bil knee instability. Flexed trunk/hips. Incontinent of bowel/bladder upon standing. Cues for hand placement as pt prefers to pull up on RW. Shallow breaths which daughter reports as baseline. HR up to 115 bpm  Ambulation/Gait             General Gait Details: Unable  Stairs            Wheelchair Mobility    Modified Rankin (Stroke Patients Only)       Balance Overall balance assessment: History of Falls;Needs assistance Sitting-balance support: Feet supported;Bilateral upper extremity supported Sitting balance-Leahy Scale: Fair Sitting balance - Comments: Close Min guard for safety due to anterior lean.   Standing balance support: During functional activity Standing balance-Leahy Scale: Poor Standing balance comment: Requires Min A for standing; poor standing tolerance due to fatigue/weakness.                             Pertinent Vitals/Pain Pain Assessment: Faces Faces Pain Scale: Hurts little more Pain Location: pelvis with movement; right flank Pain Descriptors / Indicators: Sore Pain Intervention(s): Monitored during session;Repositioned;Premedicated before session    Home Living Family/patient expects to be discharged to:: Private residence Living Arrangements: Alone Available Help at Discharge: Family;Available PRN/intermittently;Personal care attendant Type of Home: House Home Access: Ramped entrance     Home Layout: One level Home Equipment: Bedside commode;Walker - 2 wheels;Walker - 4 wheels;Shower seat;Other (comment) Additional Comments: hospital bed    Prior Function Level of Independence: Needs assistance   Gait / Transfers  Assistance Needed: Uses rollator for ambulation; last fall in january  ADL's / Homemaking Assistance Needed: Family assists with IADLs, driving. Has a sitter come in 3 days/week for 6 hours to assist with bathing/dressing and IADLs/meal  prep. Family preps med box        Hand Dominance   Dominant Hand: Left    Extremity/Trunk Assessment   Upper Extremity Assessment Upper Extremity Assessment: Defer to OT evaluation    Lower Extremity Assessment Lower Extremity Assessment: Generalized weakness    Cervical / Trunk Assessment Cervical / Trunk Assessment: Kyphotic  Communication   Communication: No difficulties  Cognition Arousal/Alertness: Awake/alert Behavior During Therapy: Impulsive;WFL for tasks assessed/performed Overall Cognitive Status: Within Functional Limits for tasks assessed                                 General Comments: for basic mobility tasks. Answering questions appropriately. Impulsive.      General Comments General comments (skin integrity, edema, etc.): Daughter present and assists with mobility/pericare.    Exercises     Assessment/Plan    PT Assessment Patient needs continued PT services  PT Problem List Decreased strength;Decreased mobility;Pain;Decreased balance;Decreased activity tolerance;Decreased range of motion;Decreased safety awareness       PT Treatment Interventions Therapeutic exercise;Patient/family education;Therapeutic activities;Functional mobility training;Balance training;Gait training;DME instruction;Wheelchair mobility training    PT Goals (Current goals can be found in the Care Plan section)  Acute Rehab PT Goals Patient Stated Goal: to get better and go home PT Goal Formulation: With patient/family Time For Goal Achievement: 04/26/21 Potential to Achieve Goals: Fair    Frequency Min 2X/week   Barriers to discharge Decreased caregiver support      Co-evaluation               AM-PAC PT "6 Clicks" Mobility  Outcome Measure Help needed turning from your back to your side while in a flat bed without using bedrails?: A Little Help needed moving from lying on your back to sitting on the side of a flat bed without using bedrails?: A  Lot Help needed moving to and from a bed to a chair (including a wheelchair)?: A Lot Help needed standing up from a chair using your arms (e.g., wheelchair or bedside chair)?: A Lot Help needed to walk in hospital room?: A Lot Help needed climbing 3-5 steps with a railing? : Total 6 Click Score: 12    End of Session Equipment Utilized During Treatment: Gait belt Activity Tolerance: Patient limited by fatigue Patient left: in chair;with call bell/phone within reach;with chair alarm set;with family/visitor present Nurse Communication: Mobility status;Other (comment) (assist of 2 for mobility; purewick) PT Visit Diagnosis: Pain;Muscle weakness (generalized) (M62.81);Difficulty in walking, not elsewhere classified (R26.2);Unsteadiness on feet (R26.81) Pain - Right/Left: Right Pain - part of body:  (flank, pelvis)    Time: 8144-8185 PT Time Calculation (min) (ACUTE ONLY): 35 min   Charges:   PT Evaluation $PT Eval Moderate Complexity: 1 Mod PT Treatments $Therapeutic Activity: 8-22 mins        Vale Haven, PT, DPT Acute Rehabilitation Services Pager [phone] Office [phone]      Blake Divine A Lanier Ensign 04/12/2021, 3:26 PM

## 2021-04-12 NOTE — Progress Notes (Signed)
PROGRESS NOTE    [name]  IFO:[id number] DOB: [birth year] DOA: 04/11/2021 PCP: Gaspar Garbe, MD  Outpatient Specialists:   Brief Narrative:  As per H&P done by Dr. Toniann Fail: "Lauren Avery is a 87 y.o. female with history of hypertension, diabetes mellitus, emphysema was brought to the ER after patient was found on the floor after the patient's daughter went to have a routine visit this afternoon.  Patient states she fell yesterday probably after tripping on something and was unable to get up.  She did hit her head but did not lose consciousness.  ED Course: In the ER x-rays revealed left superior and inferior pubic rami fracture.  Patient also has a large ecchymotic area on the right neck area but CT scan of the head and neck was unremarkable.  EKG shows normal sinus rhythm with RBBB and nonspecific ST-T changes COVID test was negative.  Labs are significant for CK of 590 WBC of 13.5.  ER physician discussed with on-call orthopedic surgeon Dr. Roda Shutters will be seeing patient in consult.  Patient admitted for further management".  04/12/2021: Patient seen alongside 2 daughters and patient's nurse.  No new complaints.  Pain is controlled.  No surgery is planned.  For conservative management.  Awaiting physical therapy input.  There are concerns about possible aspiration.  Speech therapy has been consulted.  MRA neck is negative.  MRI cervical spine is negative for any acute traumatic injury.  Assessment & Plan:   Principal Problem:   Pelvic fracture (HCC) Active Problems:   Essential hypertension   IPF (idiopathic pulmonary fibrosis) (HCC)   Diabetes mellitus type 2 in nonobese (HCC)  Left superior and inferior pubic rami fracture: -Status post mechanical fall  -For conservative management as per orthopedic team. -Pursue pain control. -Await PT OT input.   Large ecchymotic area in the right neck area: -MRA neck is not revealing.  History of idiopathic pulmonary  fibrosis/emphysema: -On inhalers.  Hypertension: -On losartan.  Diabetes mellitus type 2: -On sliding scale coverage.  Leukocytosis: -Likely reactionary.  Elevated CK levels: -Renal function remains normal.   -CPK is on the downward trend.  CPK today is 375.  DVT prophylaxis: SCD Code Status: Full code Family Communication: Daughters Disposition Plan: Await PT evaluation.   Consultants:   Orthopedic team  Procedures:   None  Antimicrobials:   None   Subjective: No new complaints. Pain seems to be adequately controlled  Objective: Vitals:   04/12/21 0137 04/12/21 0444 04/12/21 0600 04/12/21 0746  BP: (!) 146/63 (!) 163/71 136/72 (!) 144/72  Pulse: 92 (!) 106  85  Resp: 19 18  17   Temp: 98.3 F (36.8 C) 98.2 F (36.8 C)  98.3 F (36.8 C)  TempSrc: Oral   Oral  SpO2: 91% 92%  94%  Weight:      Height:        Intake/Output Summary (Last 24 hours) at 04/12/2021 1338 Last data filed at 04/11/2021 2257 Gross per 24 hour  Intake 120 ml  Output --  Net 120 ml   Filed Weights   04/11/21 1735 04/11/21 2252  Weight: 51.2 kg 49 kg    Examination:  General exam: Appears calm and comfortable.  Ecchymosis around the anterior aspect of neck and upper chest area. Respiratory system: Clear to auscultation.  Cardiovascular system: S1 & S2 heard Gastrointestinal system: Abdomen is nondistended, soft and nontender. No organomegaly or masses felt. Normal bowel sounds heard. Central nervous system: Alert and oriented. No focal  neurological deficits. Extremities: No leg edema  Data Reviewed: I have personally reviewed following labs and imaging studies  CBC: Recent Labs  Lab 04/11/21 1753 04/12/21 0330  WBC 13.5* 14.6*  NEUTROABS 11.0*  --   HGB 13.9 12.8  HCT 42.0 38.4  MCV 92.1 89.7  PLT 97* 85*   Basic Metabolic Panel: Recent Labs  Lab 04/11/21 1753 04/12/21 0330  NA 131* 133*  K 4.3 4.1  CL 97* 100  CO2 27 24  GLUCOSE 179* 163*  BUN 12 15   CREATININE 0.92 0.82  CALCIUM 10.0 9.7   GFR: Estimated Creatinine Clearance: 33 mL/min (by C-G formula based on SCr of 0.82 mg/dL). Liver Function Tests: Recent Labs  Lab 04/11/21 1753  AST 55*  ALT 29  ALKPHOS 116  BILITOT 4.2*  PROT 7.0  ALBUMIN 3.6   No results for input(s): LIPASE, AMYLASE in the last 168 hours. No results for input(s): AMMONIA in the last 168 hours. Coagulation Profile: No results for input(s): INR, PROTIME in the last 168 hours. Cardiac Enzymes: Recent Labs  Lab 04/11/21 1753 04/12/21 0330  CKTOTAL 590* 375*   BNP (last 3 results) No results for input(s): PROBNP in the last 8760 hours. HbA1C: No results for input(s): HGBA1C in the last 72 hours. CBG: Recent Labs  Lab 04/11/21 2256 04/12/21 0646 04/12/21 1112  GLUCAP 148* 207* 185*   Lipid Profile: No results for input(s): CHOL, HDL, LDLCALC, TRIG, CHOLHDL, LDLDIRECT in the last 72 hours. Thyroid Function Tests: No results for input(s): TSH, T4TOTAL, FREET4, T3FREE, THYROIDAB in the last 72 hours. Anemia Panel: No results for input(s): VITAMINB12, FOLATE, FERRITIN, TIBC, IRON, RETICCTPCT in the last 72 hours. Urine analysis:    Component Value Date/Time   COLORURINE YELLOW 04/11/2021 1754   APPEARANCEUR CLEAR 04/11/2021 1754   LABSPEC 1.015 04/11/2021 1754   PHURINE 7.0 04/11/2021 1754   GLUCOSEU 50 (A) 04/11/2021 1754   HGBUR MODERATE (A) 04/11/2021 1754   BILIRUBINUR NEGATIVE 04/11/2021 1754   KETONESUR NEGATIVE 04/11/2021 1754   PROTEINUR NEGATIVE 04/11/2021 1754   UROBILINOGEN 0.2 02/17/2013 1933   NITRITE NEGATIVE 04/11/2021 1754   LEUKOCYTESUR NEGATIVE 04/11/2021 1754   Sepsis Labs: @LABRCNTIP (procalcitonin:4,lacticidven:4)  ) Recent Results (from the past 240 hour(s))  SARS CORONAVIRUS 2 (TAT 6-24 HRS) Nasopharyngeal Nasopharyngeal Swab     Status: None   Collection Time: 04/11/21  8:27 PM   Specimen: Nasopharyngeal Swab  Result Value Ref Range Status   SARS  Coronavirus 2 NEGATIVE NEGATIVE Final    Comment: (NOTE) SARS-CoV-2 target nucleic acids are NOT DETECTED.  The SARS-CoV-2 RNA is generally detectable in upper and lower respiratory specimens during the acute phase of infection. Negative results do not preclude SARS-CoV-2 infection, do not rule out co-infections with other pathogens, and should not be used as the sole basis for treatment or other patient management decisions. Negative results must be combined with clinical observations, patient history, and epidemiological information. The expected result is Negative.  Fact Sheet for Patients: 06/11/21  Fact Sheet for Healthcare Providers: HairSlick.no  This test is not yet approved or cleared by the quierodirigir.com FDA and  has been authorized for detection and/or diagnosis of SARS-CoV-2 by FDA under an Emergency Use Authorization (EUA). This EUA will remain  in effect (meaning this test can be used) for the duration of the COVID-19 declaration under Se ction 564(b)(1) of the Act, 21 U.S.C. section 360bbb-3(b)(1), unless the authorization is terminated or revoked sooner.  Performed at [facility], [address], [place], [place] [address]          Radiology Studies: DG Chest 1 View  Result Date: 04/11/2021 CLINICAL DATA:  Left hip pain following a fall last night. EXAM: CHEST  1 VIEW COMPARISON:  12/28/2020 FINDINGS: Interval mildly enlarged cardiac silhouette. Tortuous aorta. Stable bilateral interstitial fibrosis. Diffuse osteopenia. Interval increased compression deformity of the T12 vertebral body. Stable T10 vertebral body compression deformity. Possible interval T8 vertebral body compression deformity. Old, healed left posterior 2nd rib fracture. IMPRESSION: 1. Interval mild cardiomegaly. 2. Stable interstitial fibrosis. 3. Progressive thoracolumbar vertebral compression deformities. Electronically  Signed   By: Beckie Salts M.D.   On: 04/11/2021 18:52   CT Head Wo Contrast  Result Date: 04/11/2021 CLINICAL DATA:  Larey Seat last night.  Subsequent mental status changes. EXAM: CT HEAD WITHOUT CONTRAST CT CERVICAL SPINE WITHOUT CONTRAST TECHNIQUE: Multidetector CT imaging of the head and cervical spine was performed following the standard protocol without intravenous contrast. Multiplanar CT image reconstructions of the cervical spine were also generated. COMPARISON:  02/17/2013 FINDINGS: CT HEAD FINDINGS Brain: Mild age related volume loss. Mild chronic small-vessel change of the white matter. No sign of acute infarction, mass lesion, hemorrhage, hydrocephalus or extra-axial collection. Vascular: There is atherosclerotic calcification of the major vessels at the base of the brain. Skull: Negative Sinuses/Orbits: Clear except for a tiny amount of fluid layering in the right maxillary sinus, not likely significant. Other: None CT CERVICAL SPINE FINDINGS Alignment: Normal alignment. Skull base and vertebrae: No regional fracture or focal bone lesion. Soft tissues and spinal canal: Negative Disc levels: Mild spondylosis and facet osteoarthritis. No apparent compressive stenosis of the canal or foramina. Upper chest: Emphysema and scarring. Other: None IMPRESSION: Head CT: No acute or traumatic finding. Mild age related volume loss and small-vessel change of the white matter. Cervical spine CT: No acute or traumatic finding. Ordinary mild spondylosis and facet osteoarthritis. Emphysema (ICD10-J43.9). Electronically Signed   By: Paulina Fusi M.D.   On: 04/11/2021 18:59   CT Cervical Spine Wo Contrast  Result Date: 04/11/2021 CLINICAL DATA:  Larey Seat last night.  Subsequent mental status changes. EXAM: CT HEAD WITHOUT CONTRAST CT CERVICAL SPINE WITHOUT CONTRAST TECHNIQUE: Multidetector CT imaging of the head and cervical spine was performed following the standard protocol without intravenous contrast. Multiplanar CT  image reconstructions of the cervical spine were also generated. COMPARISON:  02/17/2013 FINDINGS: CT HEAD FINDINGS Brain: Mild age related volume loss. Mild chronic small-vessel change of the white matter. No sign of acute infarction, mass lesion, hemorrhage, hydrocephalus or extra-axial collection. Vascular: There is atherosclerotic calcification of the major vessels at the base of the brain. Skull: Negative Sinuses/Orbits: Clear except for a tiny amount of fluid layering in the right maxillary sinus, not likely significant. Other: None CT CERVICAL SPINE FINDINGS Alignment: Normal alignment. Skull base and vertebrae: No regional fracture or focal bone lesion. Soft tissues and spinal canal: Negative Disc levels: Mild spondylosis and facet osteoarthritis. No apparent compressive stenosis of the canal or foramina. Upper chest: Emphysema and scarring. Other: None IMPRESSION: Head CT: No acute or traumatic finding. Mild age related volume loss and small-vessel change of the white matter. Cervical spine CT: No acute or traumatic finding. Ordinary mild spondylosis and facet osteoarthritis. Emphysema (ICD10-J43.9). Electronically Signed   By: Paulina Fusi M.D.   On: 04/11/2021 18:59   MR ANGIO NECK W WO CONTRAST  Result Date: 04/12/2021 CLINICAL DATA:  Initial evaluation for acute trauma, fall. EXAM:  MRA NECK WITHOUT AND WITH CONTRAST TECHNIQUE: Multiplanar and multiecho pulse sequences of the neck were obtained without and with intravenous contrast. Angiographic images of the neck were obtained using MRA technique without and with intravenous contrast. CONTRAST:  54mL GADAVIST GADOBUTROL 1 MMOL/ML IV SOLN COMPARISON:  None. FINDINGS: Examination moderately to severely degraded by motion artifact. AORTIC ARCH: Visualized aortic arch normal in caliber with normal branch pattern. No hemodynamically significant stenosis seen about the origin of the great vessels. Visualized subclavian arteries widely patent. RIGHT CAROTID  SYSTEM: Right CCA patent from its origin to the bifurcation without stenosis. No more than mild atheromatous irregularity about the right bifurcation/proximal right ICA without significant stenosis. Right ICA patent distally without stenosis, evidence for dissection, or occlusion. LEFT CAROTID SYSTEM: Left CCA patent from its origin to the bifurcation without stenosis. No appreciable atheromatous irregularity or narrowing about the left bifurcation. Left ICA patent distally without stenosis, evidence for dissection, or occlusion. VERTEBRAL ARTERIES: Both vertebral arteries arise from the subclavian arteries. Vertebral arteries patent with antegrade flow, with no appreciable stenosis, evidence for dissection or occlusion. IMPRESSION: 1. Technically limited exam due to motion artifact. 2. Grossly negative MRA of the neck. No evidence for acute traumatic vascular injury or other abnormality identified. 3. Wide patency of both carotid artery systems as well as the vertebral arteries within the neck. No hemodynamically significant stenosis. Electronically Signed   By: Rise Mu M.D.   On: 04/12/2021 01:42   MR CERVICAL SPINE WO CONTRAST  Result Date: 04/12/2021 CLINICAL DATA:  Initial evaluation for acute trauma, fall. EXAM: MRI CERVICAL SPINE WITHOUT CONTRAST TECHNIQUE: Multiplanar, multisequence MR imaging of the cervical spine was performed. No intravenous contrast was administered. COMPARISON:  Prior CT from 04/11/2021. FINDINGS: Alignment: Examination degraded by motion artifact. Vertebral bodies normally aligned with preservation of the normal cervical lordosis. No significant listhesis. Vertebrae: Vertebral body height maintained without acute or chronic fracture. Bone marrow signal intensity within normal limits. No worrisome osseous lesions. No abnormal marrow edema. Cord: Grossly normal signal and morphology on this motion degraded exam. No appreciable cord signal abnormality. No findings to  suggest ligamentous injury. Posterior Fossa, vertebral arteries, paraspinal tissues: Visualized brain and posterior fossa within normal limits. Craniocervical junction normal. Paraspinous and prevertebral soft tissues demonstrate no acute finding. Normal flow voids seen within the vertebral arteries bilaterally. Disc levels: C2-C3: Small central disc protrusion indents the ventral thecal sac. No spinal stenosis or cord deformity. Left greater than right facet hypertrophy without significant foraminal stenosis. C3-C4: Disc bulge with right greater than left uncovertebral hypertrophy. Mild facet degeneration. No spinal stenosis. Moderate right with mild left C4 foraminal stenosis. C4-C5: Disc bulge with bilateral uncovertebral hypertrophy. Left worse than right facet degeneration. No significant spinal stenosis. Moderate left C5 foraminal narrowing. No significant right foraminal stenosis. C5-C6: Degenerative intervertebral disc space narrowing. Diffuse disc bulge with bilateral uncovertebral and endplate spurring. Posterior disc osteophyte flattens and partially effaces the ventral thecal sac with resultant mild spinal stenosis. No frank cord impingement. Moderate right worse than left C6 foraminal narrowing. C6-C7: Degenerative intervertebral disc space narrowing with diffuse disc osteophyte complex. Flattening and partial effacement of the ventral thecal sac with no more than mild spinal stenosis. No cord impingement. Moderate left with moderate to severe right C7 foraminal stenosis. C7-T1: Mild disc bulge. Bilateral facet hypertrophy. No spinal stenosis. Foramina appear grossly patent. Visualized upper thoracic spine demonstrates no significant finding. IMPRESSION: 1. No acute traumatic injury or other abnormality within the cervical spine. 2.  Multilevel cervical spondylosis with resultant mild spinal stenosis at C5-6 and C6-7. 3. Multifactorial degenerative changes with resultant multilevel foraminal narrowing as  above. Notable findings include moderate right C4 and left C5 foraminal stenosis, moderate right worse than left C6 foraminal narrowing, with moderate left and moderate to severe right C7 foraminal stenosis. Electronically Signed   By: Rise MuBenjamin  McClintock M.D.   On: 04/12/2021 01:37   DG Hip Unilat W or Wo Pelvis 2-3 Views Left  Result Date: 04/11/2021 CLINICAL DATA:  Left hip pain following a fall last night. EXAM: DG HIP (WITH OR WITHOUT PELVIS) 2-3V LEFT COMPARISON:  Pelvis and right hip dated 05/09/2019. Abdomen and pelvis CT dated 10/04/2020. FINDINGS: Stable old, possible acute left inferior pubic ramus fracture at the location of a previously demonstrated old, healed fracture. There is also an acute, mildly comminuted and mildly displaced fracture of the left superior pubic ramus. No hip fracture or dislocation seen. Stable right hip prosthesis. Diffuse osteopenia. IMPRESSION: 1. Left superior and inferior pubic ramus fractures. 2. No hip fracture or dislocation. Electronically Signed   By: Beckie SaltsSteven  Reid M.D.   On: 04/11/2021 18:48        Scheduled Meds: . insulin aspart  0-9 Units Subcutaneous TID WC  . insulin glargine  8 Units Subcutaneous QHS  . losartan  50 mg Oral Daily  . mometasone-formoterol  2 puff Inhalation BID  . pantoprazole  40 mg Oral Daily  . sertraline  25 mg Oral QHS   Continuous Infusions: . sodium chloride 75 mL/hr at 04/11/21 2257  . methocarbamol (ROBAXIN) IV       LOS: 0 days    Time spent: 35 minutes    Berton MountSylvester Lewayne Pauley, MD  [facility] Pager #: [phone] 7PM-7AM contact night coverage as above

## 2021-04-12 NOTE — Evaluation (Signed)
Clinical/Bedside Swallow Evaluation Patient Details  Name: Lauren Avery MRN: [id number] Date of Birth: 1926-12-24  Today's Date: 04/12/2021 Time: SLP Start Time (ACUTE ONLY): 1130 SLP Stop Time (ACUTE ONLY): 1145 SLP Time Calculation (min) (ACUTE ONLY): 15.78 min  Past Medical History:  Past Medical History:  Diagnosis Date  . Diabetes mellitus   . Fracture 10/2012   left wrist  . Fracture of left clavicle    12/12 wore a sling  . GERD (gastroesophageal reflux disease)   . Hypertension   . Osteoporosis   . Pulmonary fibrosis (HCC)    Past Surgical History:  Past Surgical History:  Procedure Laterality Date  . FEMUR FRACTURE SURGERY      6/08 partial hip replacement right  . ganglion cyst removal     left wrist 1960  . HIP FRACTURE SURGERY     1984 pin and plate placed right  . HIP SURGERY     pin and plate removed 08/5637  . ORIF ELBOW FRACTURE Right 02/18/2013   Procedure: OPEN REDUCTION INTERNAL FIXATION (ORIF) ELBOW/OLECRANON FRACTURE;  Surgeon: Nadara Mustard, MD;  Location: [facility] OR;  Service: Orthopedics;  Laterality: Right;  . ORIF ELBOW FRACTURE Left 05/09/2015   Procedure: OPEN REDUCTION INTERNAL FIXATION (ORIF) LEFT MEDIAL HUMERAL CONDYLE FRACTURE;  Surgeon: Tarry Kos, MD;  Location: [facility] OR;  Service: Orthopedics;  Laterality: Left;   HPI:  Pt is a 87 y.o. female who was brought to the ED after being found on the floor from fall.  Pt hit her head but did not lose consciousness. MRA5/12: Grossly negative. Head CT 5/12: No acute or traumatic finding. SLP was consulted after significant coughing event with intake of thin liquids. PMH: hypertension, diabetes mellitus, emphysema, GERD (on protonix).   Assessment / Plan / Recommendation Clinical Impression  Pt was seen for bedside swallow evaluation with her daughters present. Pt reported that she has a coughing episode with liquids at least twice per week and pt's family reported frequent throat clearing at baseline.  Oral mechanism exam was [facility] and dentition was adequate. She presented with symptoms of oropharyngeal dysphagia characterized by consistent throat clearing and delayed coughing  with thin liquids and multiple swallows with purees. A modified barium swallow study is recommended at this time to further assess swallow function. It is currently scheduled for today at 1330 and pt's current diet may be continued with observance of swallowing precautions until the study is completed. SLP Visit Diagnosis: Dysphagia, unspecified (R13.10)    Aspiration Risk  Mild aspiration risk    Diet Recommendation  (Continue current diet until MBS is completed)   Liquid Administration via: Cup;No straw Medication Administration: Whole meds with puree Supervision: Staff to assist with self feeding Compensations: Slow rate;Small sips/bites Postural Changes: Seated upright at 90 degrees    Other  Recommendations Oral Care Recommendations: Oral care BID   Follow up Recommendations  (TBD)      Frequency and Duration min 2x/week  2 weeks       Prognosis Prognosis for Safe Diet Advancement: Good Barriers to Reach Goals: Time post onset      Swallow Study   General Date of Onset: 04/11/21 HPI: Pt is a 87 y.o. female who was brought to the ED after being found on the floor from fall.  Pt hit her head but did not lose consciousness. MRA5/12: Grossly negative. Head CT 5/12: No acute or traumatic finding. SLP was consulted after significant coughing event with intake of thin liquids. PMH:  hypertension, diabetes mellitus, emphysema, GERD (on protonix). Type of Study: Bedside Swallow Evaluation Previous Swallow Assessment: none Diet Prior to this Study: Regular;Thin liquids Temperature Spikes Noted: No Respiratory Status: Room air History of Recent Intubation: No Behavior/Cognition: Alert;Cooperative;Pleasant mood Oral Cavity Assessment: Within Functional Limits Oral Care Completed by SLP: No Oral Cavity -  Dentition: Adequate natural dentition Vision: Functional for self-feeding Self-Feeding Abilities: Able to feed self Patient Positioning: Upright in bed;Postural control adequate for testing Baseline Vocal Quality: Low vocal intensity;Hoarse (mildly hoarse) Volitional Cough: Strong;Congested Volitional Swallow: Able to elicit    Oral/Motor/Sensory Function Overall Oral Motor/Sensory Function: Within functional limits   Ice Chips Ice chips: Not tested   Thin Liquid Thin Liquid: Impaired Presentation: Cup;Spoon;Straw Pharyngeal  Phase Impairments: Multiple swallows;Throat Clearing - Immediate;Cough - Delayed    Nectar Thick Nectar Thick Liquid: Not tested   Honey Thick Honey Thick Liquid: Not tested   Puree Puree: Within functional limits Presentation: Spoon   Solid     Solid: Within functional limits Presentation: Self Fed     Carlitos Bottino I. Vear Clock, MS, CCC-SLP Acute Rehabilitation Services Office number [phone] Pager [phone]  Scheryl Marten 04/12/2021,12:15 PM

## 2021-04-12 NOTE — TOC Progression Note (Signed)
Transition of Care [facility]) - Progression Note    Patient Details  Name: Lauren Avery MRN: [id number] Date of Birth: 03/31/1927  [facility] CM/SW Contact  Ralene Bathe, LCSWA Phone Number: 04/12/2021, 10:12 AM  Clinical Narrative:     CSW following patient for any d/c planning needs once medically stable.  Pending PT eval and recs.  Cleon Gustin, MSW, LCSWA       Expected Discharge Plan and Services                                                 Social Determinants of Health (SDOH) Interventions    Readmission Risk Interventions No flowsheet data found.

## 2021-04-13 DIAGNOSIS — S20219A Contusion of unspecified front wall of thorax, initial encounter: Secondary | ICD-10-CM | POA: Diagnosis present

## 2021-04-13 DIAGNOSIS — F0150 Vascular dementia without behavioral disturbance: Secondary | ICD-10-CM | POA: Diagnosis not present

## 2021-04-13 DIAGNOSIS — Z7951 Long term (current) use of inhaled steroids: Secondary | ICD-10-CM | POA: Diagnosis not present

## 2021-04-13 DIAGNOSIS — R1313 Dysphagia, pharyngeal phase: Secondary | ICD-10-CM | POA: Diagnosis present

## 2021-04-13 DIAGNOSIS — E119 Type 2 diabetes mellitus without complications: Secondary | ICD-10-CM | POA: Diagnosis present

## 2021-04-13 DIAGNOSIS — R410 Disorientation, unspecified: Secondary | ICD-10-CM | POA: Diagnosis not present

## 2021-04-13 DIAGNOSIS — M810 Age-related osteoporosis without current pathological fracture: Secondary | ICD-10-CM | POA: Diagnosis present

## 2021-04-13 DIAGNOSIS — Z833 Family history of diabetes mellitus: Secondary | ICD-10-CM | POA: Diagnosis not present

## 2021-04-13 DIAGNOSIS — J84112 Idiopathic pulmonary fibrosis: Secondary | ICD-10-CM | POA: Diagnosis present

## 2021-04-13 DIAGNOSIS — M25552 Pain in left hip: Secondary | ICD-10-CM | POA: Diagnosis present

## 2021-04-13 DIAGNOSIS — I10 Essential (primary) hypertension: Secondary | ICD-10-CM | POA: Diagnosis present

## 2021-04-13 DIAGNOSIS — Z888 Allergy status to other drugs, medicaments and biological substances status: Secondary | ICD-10-CM | POA: Diagnosis not present

## 2021-04-13 DIAGNOSIS — Z7952 Long term (current) use of systemic steroids: Secondary | ICD-10-CM | POA: Diagnosis not present

## 2021-04-13 DIAGNOSIS — W010XXA Fall on same level from slipping, tripping and stumbling without subsequent striking against object, initial encounter: Secondary | ICD-10-CM | POA: Diagnosis present

## 2021-04-13 DIAGNOSIS — S32599D Other specified fracture of unspecified pubis, subsequent encounter for fracture with routine healing: Secondary | ICD-10-CM

## 2021-04-13 DIAGNOSIS — Z20822 Contact with and (suspected) exposure to covid-19: Secondary | ICD-10-CM | POA: Diagnosis present

## 2021-04-13 DIAGNOSIS — S32599A Other specified fracture of unspecified pubis, initial encounter for closed fracture: Secondary | ICD-10-CM | POA: Diagnosis present

## 2021-04-13 DIAGNOSIS — Z23 Encounter for immunization: Secondary | ICD-10-CM | POA: Diagnosis present

## 2021-04-13 DIAGNOSIS — D72829 Elevated white blood cell count, unspecified: Secondary | ICD-10-CM | POA: Diagnosis present

## 2021-04-13 DIAGNOSIS — F32A Depression, unspecified: Secondary | ICD-10-CM | POA: Diagnosis present

## 2021-04-13 DIAGNOSIS — K219 Gastro-esophageal reflux disease without esophagitis: Secondary | ICD-10-CM | POA: Diagnosis present

## 2021-04-13 DIAGNOSIS — J439 Emphysema, unspecified: Secondary | ICD-10-CM | POA: Diagnosis present

## 2021-04-13 DIAGNOSIS — G9341 Metabolic encephalopathy: Secondary | ICD-10-CM | POA: Diagnosis not present

## 2021-04-13 DIAGNOSIS — S50319A Abrasion of unspecified elbow, initial encounter: Secondary | ICD-10-CM | POA: Diagnosis present

## 2021-04-13 DIAGNOSIS — S32512A Fracture of superior rim of left pubis, initial encounter for closed fracture: Secondary | ICD-10-CM | POA: Diagnosis present

## 2021-04-13 DIAGNOSIS — S32592A Other specified fracture of left pubis, initial encounter for closed fracture: Secondary | ICD-10-CM | POA: Diagnosis present

## 2021-04-13 DIAGNOSIS — Y9301 Activity, walking, marching and hiking: Secondary | ICD-10-CM | POA: Diagnosis present

## 2021-04-13 DIAGNOSIS — R5381 Other malaise: Secondary | ICD-10-CM | POA: Diagnosis not present

## 2021-04-13 DIAGNOSIS — Z79899 Other long term (current) drug therapy: Secondary | ICD-10-CM | POA: Diagnosis not present

## 2021-04-13 DIAGNOSIS — Z96641 Presence of right artificial hip joint: Secondary | ICD-10-CM | POA: Diagnosis present

## 2021-04-13 DIAGNOSIS — G8911 Acute pain due to trauma: Secondary | ICD-10-CM | POA: Diagnosis not present

## 2021-04-13 DIAGNOSIS — G9340 Encephalopathy, unspecified: Secondary | ICD-10-CM | POA: Diagnosis present

## 2021-04-13 DIAGNOSIS — R748 Abnormal levels of other serum enzymes: Secondary | ICD-10-CM | POA: Diagnosis present

## 2021-04-13 DIAGNOSIS — Y92009 Unspecified place in unspecified non-institutional (private) residence as the place of occurrence of the external cause: Secondary | ICD-10-CM | POA: Diagnosis not present

## 2021-04-13 DIAGNOSIS — S329XXA Fracture of unspecified parts of lumbosacral spine and pelvis, initial encounter for closed fracture: Secondary | ICD-10-CM | POA: Diagnosis not present

## 2021-04-13 DIAGNOSIS — I4510 Unspecified right bundle-branch block: Secondary | ICD-10-CM | POA: Diagnosis present

## 2021-04-13 LAB — URINALYSIS, ROUTINE W REFLEX MICROSCOPIC
Bilirubin Urine: NEGATIVE
Glucose, UA: >=500 mg/dL — AB
Ketones, ur: NEGATIVE mg/dL
Nitrite: NEGATIVE
Protein, ur: NEGATIVE mg/dL
Specific Gravity, Urine: 1.008 (ref 1.005–1.030)
pH: 7 (ref 5.0–8.0)

## 2021-04-13 LAB — VITAMIN B12: Vitamin B-12: 832 pg/mL (ref 180–914)

## 2021-04-13 LAB — GLUCOSE, CAPILLARY
Glucose-Capillary: 126 mg/dL — ABNORMAL HIGH (ref 70–99)
Glucose-Capillary: 146 mg/dL — ABNORMAL HIGH (ref 70–99)
Glucose-Capillary: 178 mg/dL — ABNORMAL HIGH (ref 70–99)
Glucose-Capillary: 233 mg/dL — ABNORMAL HIGH (ref 70–99)

## 2021-04-13 LAB — FOLATE: Folate: 13.3 ng/mL (ref >5.9)

## 2021-04-13 NOTE — Progress Notes (Signed)
PROGRESS NOTE    Lauren Avery  ZOX:[id number] DOB: 1927/07/06 DOA: 04/11/2021 PCP: Gaspar Garbe, MD  Outpatient Specialists:   Brief Narrative:  As per H&P done by Dr. Toniann Fail: "Lauren Avery is a 87 y.o. female with history of hypertension, diabetes mellitus, emphysema was brought to the ER after patient was found on the floor after the patient's daughter went to have a routine visit this afternoon.  Patient states she fell yesterday probably after tripping on something and was unable to get up.  She did hit her head but did not lose consciousness.  ED Course: In the ER x-rays revealed left superior and inferior pubic rami fracture.  Patient also has a large ecchymotic area on the right neck area but CT scan of the head and neck was unremarkable.  EKG shows normal sinus rhythm with RBBB and nonspecific ST-T changes COVID test was negative.  Labs are significant for CK of 590 WBC of 13.5.  ER physician discussed with on-call orthopedic surgeon Dr. Roda Shutters will be seeing patient in consult.  Patient admitted for further management".  04/12/2021: Patient seen alongside 2 daughters and patient's nurse.  No new complaints.  Pain is controlled.  No surgery is planned.  For conservative management.  Awaiting physical therapy input.  There are concerns about possible aspiration.  Speech therapy has been consulted.  MRA neck is negative.  MRI cervical spine is negative for any acute traumatic injury.  04/13/2021: Patient seen alongside patient's nurse and patient's daughter.  Events of last night noted.  Apparently, patient barely slept last night, reported to have been agitated and confused.  No prior history of dementing illness.  Patient was living independently at home.  Patient's daughter reports that patient was coping at home.  Patient was able to count from 20 backwards to 1.  Patient was able to spell "world " backwards.  Patient knew the date, date, month and year.  However, CT head  revealed small vessel changes of the white matter.  Cannot entirely rule out possible underlying vascular dementia/multi-infarct dementia.  We will also check folate, B12.  Will repeat urinalysis and urine culture to rule out any organic component.  Difficulty bearing weight.   Assessment & Plan:   Principal Problem:   Pelvic fracture (HCC) Active Problems:   Essential hypertension   IPF (idiopathic pulmonary fibrosis) (HCC)   Diabetes mellitus type 2 in nonobese (HCC)   Pubic ramus fracture (HCC)  Left superior and inferior pubic rami fracture: -Status post mechanical fall  -For conservative management as per orthopedic team. -Pursue pain control. -Await PT OT input. 04/13/2021: Input from surgery, PT OT teams appreciated.  Large ecchymotic area in the right neck area: -MRA neck is not revealing.  History of idiopathic pulmonary fibrosis/emphysema: -On inhalers.  Hypertension: -On losartan.  Diabetes mellitus type 2: -On sliding scale coverage.  Leukocytosis: -Likely reactionary.  Elevated CK levels: -Renal function remains normal.   -CPK is on the downward trend.  CPK today is 375.  Delirium/encephalopathy: -Etiology unclear. -Cannot entirely rule out possible underlying multi-infarct dementia/vascular dementia. -Continue work-up for possible organic causes. -Further management depend on hospital course.  DVT prophylaxis: SCD Code Status: Full code Family Communication: Daughters Disposition Plan: Await PT evaluation.   Consultants:   Orthopedic team  Procedures:   None  Antimicrobials:   None   Subjective: Reported to have been agitated and confused this morning and last night.  Objective: Vitals:   04/13/21 0500 04/13/21 0736 04/13/21 8119  04/13/21 1503  BP: 130/60 (!) 160/75  132/69  Pulse: 90 94  100  Resp: 17 17  16   Temp: 98 F (36.7 C) 98 F (36.7 C)  97.9 F (36.6 C)  TempSrc: Oral Oral  Oral  SpO2: 94% 93% 94% 94%  Weight:       Height:        Intake/Output Summary (Last 24 hours) at 04/13/2021 1738 Last data filed at 04/13/2021 0648 Gross per 24 hour  Intake --  Output 1000 ml  Net -1000 ml   Filed Weights   04/11/21 1735 04/11/21 2252  Weight: 51.2 kg 49 kg    Examination:  General exam: Appears calm and comfortable.  Ecchymosis around the anterior aspect of neck and upper chest area. Respiratory system: Clear to auscultation.  Cardiovascular system: S1 & S2 heard Gastrointestinal system: Abdomen is nondistended, soft and nontender. No organomegaly or masses felt. Normal bowel sounds heard. Central nervous system: Alert and oriented. No focal neurological deficits. Extremities: No leg edema  Data Reviewed: I have personally reviewed following labs and imaging studies  CBC: Recent Labs  Lab 04/11/21 1753 04/12/21 0330  WBC 13.5* 14.6*  NEUTROABS 11.0*  --   HGB 13.9 12.8  HCT 42.0 38.4  MCV 92.1 89.7  PLT 97* 85*   Basic Metabolic Panel: Recent Labs  Lab 04/11/21 1753 04/12/21 0330  NA 131* 133*  K 4.3 4.1  CL 97* 100  CO2 27 24  GLUCOSE 179* 163*  BUN 12 15  CREATININE 0.92 0.82  CALCIUM 10.0 9.7   GFR: Estimated Creatinine Clearance: 33 mL/min (by C-G formula based on SCr of 0.82 mg/dL). Liver Function Tests: Recent Labs  Lab 04/11/21 1753  AST 55*  ALT 29  ALKPHOS 116  BILITOT 4.2*  PROT 7.0  ALBUMIN 3.6   No results for input(s): LIPASE, AMYLASE in the last 168 hours. No results for input(s): AMMONIA in the last 168 hours. Coagulation Profile: No results for input(s): INR, PROTIME in the last 168 hours. Cardiac Enzymes: Recent Labs  Lab 04/11/21 1753 04/12/21 0330  CKTOTAL 590* 375*   BNP (last 3 results) No results for input(s): PROBNP in the last 8760 hours. HbA1C: No results for input(s): HGBA1C in the last 72 hours. CBG: Recent Labs  Lab 04/12/21 1618 04/12/21 2005 04/13/21 0627 04/13/21 1108 04/13/21 1615  GLUCAP 290* 154* 126* 146* 178*    Lipid Profile: No results for input(s): CHOL, HDL, LDLCALC, TRIG, CHOLHDL, LDLDIRECT in the last 72 hours. Thyroid Function Tests: No results for input(s): TSH, T4TOTAL, FREET4, T3FREE, THYROIDAB in the last 72 hours. Anemia Panel: Recent Labs    04/13/21 1424  FOLATE 13.3   Urine analysis:    Component Value Date/Time   COLORURINE YELLOW 04/11/2021 1754   APPEARANCEUR CLEAR 04/11/2021 1754   LABSPEC 1.015 04/11/2021 1754   PHURINE 7.0 04/11/2021 1754   GLUCOSEU 50 (A) 04/11/2021 1754   HGBUR MODERATE (A) 04/11/2021 1754   BILIRUBINUR NEGATIVE 04/11/2021 1754   KETONESUR NEGATIVE 04/11/2021 1754   PROTEINUR NEGATIVE 04/11/2021 1754   UROBILINOGEN 0.2 02/17/2013 1933   NITRITE NEGATIVE 04/11/2021 1754   LEUKOCYTESUR NEGATIVE 04/11/2021 1754   Sepsis Labs: @LABRCNTIP (procalcitonin:4,lacticidven:4)  ) Recent Results (from the past 240 hour(s))  SARS CORONAVIRUS 2 (TAT 6-24 HRS) Nasopharyngeal Nasopharyngeal Swab     Status: None   Collection Time: 04/11/21  8:27 PM   Specimen: Nasopharyngeal Swab  Result Value Ref Range Status   SARS Coronavirus 2 NEGATIVE NEGATIVE  Final    Comment: (NOTE) SARS-CoV-2 target nucleic acids are NOT DETECTED.  The SARS-CoV-2 RNA is generally detectable in upper and lower respiratory specimens during the acute phase of infection. Negative results do not preclude SARS-CoV-2 infection, do not rule out co-infections with other pathogens, and should not be used as the sole basis for treatment or other patient management decisions. Negative results must be combined with clinical observations, patient history, and epidemiological information. The expected result is Negative.  Fact Sheet for Patients: HairSlick.nohttps://www.fda.gov/media/138098/download  Fact Sheet for Healthcare Providers: quierodirigir.comhttps://www.fda.gov/media/138095/download  This test is not yet approved or cleared by the [place] FDA and  has been authorized for detection and/or  diagnosis of SARS-CoV-2 by FDA under an Emergency Use Authorization (EUA). This EUA will remain  in effect (meaning this test can be used) for the duration of the COVID-19 declaration under Se ction 564(b)(1) of the Act, 21 U.S.C. section 360bbb-3(b)(1), unless the authorization is terminated or revoked sooner.  Performed at [facility], [address] [place], [place] [address]          Radiology Studies: DG Chest 1 View  Result Date: 04/11/2021 CLINICAL DATA:  Left hip pain following a fall last night. EXAM: CHEST  1 VIEW COMPARISON:  12/28/2020 FINDINGS: Interval mildly enlarged cardiac silhouette. Tortuous aorta. Stable bilateral interstitial fibrosis. Diffuse osteopenia. Interval increased compression deformity of the T12 vertebral body. Stable T10 vertebral body compression deformity. Possible interval T8 vertebral body compression deformity. Old, healed left posterior 2nd rib fracture. IMPRESSION: 1. Interval mild cardiomegaly. 2. Stable interstitial fibrosis. 3. Progressive thoracolumbar vertebral compression deformities. Electronically Signed   By: Beckie SaltsSteven  Reid M.D.   On: 04/11/2021 18:52   CT Head Wo Contrast  Result Date: 04/11/2021 CLINICAL DATA:  Larey SeatFell last night.  Subsequent mental status changes. EXAM: CT HEAD WITHOUT CONTRAST CT CERVICAL SPINE WITHOUT CONTRAST TECHNIQUE: Multidetector CT imaging of the head and cervical spine was performed following the standard protocol without intravenous contrast. Multiplanar CT image reconstructions of the cervical spine were also generated. COMPARISON:  02/17/2013 FINDINGS: CT HEAD FINDINGS Brain: Mild age related volume loss. Mild chronic small-vessel change of the white matter. No sign of acute infarction, mass lesion, hemorrhage, hydrocephalus or extra-axial collection. Vascular: There is atherosclerotic calcification of the major vessels at the base of the brain. Skull: Negative Sinuses/Orbits: Clear except for a tiny amount of  fluid layering in the right maxillary sinus, not likely significant. Other: None CT CERVICAL SPINE FINDINGS Alignment: Normal alignment. Skull base and vertebrae: No regional fracture or focal bone lesion. Soft tissues and spinal canal: Negative Disc levels: Mild spondylosis and facet osteoarthritis. No apparent compressive stenosis of the canal or foramina. Upper chest: Emphysema and scarring. Other: None IMPRESSION: Head CT: No acute or traumatic finding. Mild age related volume loss and small-vessel change of the white matter. Cervical spine CT: No acute or traumatic finding. Ordinary mild spondylosis and facet osteoarthritis. Emphysema (ICD10-J43.9). Electronically Signed   By: Paulina FusiMark  Shogry M.D.   On: 04/11/2021 18:59   CT Cervical Spine Wo Contrast  Result Date: 04/11/2021 CLINICAL DATA:  Larey SeatFell last night.  Subsequent mental status changes. EXAM: CT HEAD WITHOUT CONTRAST CT CERVICAL SPINE WITHOUT CONTRAST TECHNIQUE: Multidetector CT imaging of the head and cervical spine was performed following the standard protocol without intravenous contrast. Multiplanar CT image reconstructions of the cervical spine were also generated. COMPARISON:  02/17/2013 FINDINGS: CT HEAD FINDINGS Brain: Mild age related volume loss. Mild chronic small-vessel change of  the white matter. No sign of acute infarction, mass lesion, hemorrhage, hydrocephalus or extra-axial collection. Vascular: There is atherosclerotic calcification of the major vessels at the base of the brain. Skull: Negative Sinuses/Orbits: Clear except for a tiny amount of fluid layering in the right maxillary sinus, not likely significant. Other: None CT CERVICAL SPINE FINDINGS Alignment: Normal alignment. Skull base and vertebrae: No regional fracture or focal bone lesion. Soft tissues and spinal canal: Negative Disc levels: Mild spondylosis and facet osteoarthritis. No apparent compressive stenosis of the canal or foramina. Upper chest: Emphysema and scarring.  Other: None IMPRESSION: Head CT: No acute or traumatic finding. Mild age related volume loss and small-vessel change of the white matter. Cervical spine CT: No acute or traumatic finding. Ordinary mild spondylosis and facet osteoarthritis. Emphysema (ICD10-J43.9). Electronically Signed   By: Paulina Fusi M.D.   On: 04/11/2021 18:59   MR ANGIO NECK W WO CONTRAST  Result Date: 04/12/2021 CLINICAL DATA:  Initial evaluation for acute trauma, fall. EXAM: MRA NECK WITHOUT AND WITH CONTRAST TECHNIQUE: Multiplanar and multiecho pulse sequences of the neck were obtained without and with intravenous contrast. Angiographic images of the neck were obtained using MRA technique without and with intravenous contrast. CONTRAST:  28mL GADAVIST GADOBUTROL 1 MMOL/ML IV SOLN COMPARISON:  None. FINDINGS: Examination moderately to severely degraded by motion artifact. AORTIC ARCH: Visualized aortic arch normal in caliber with normal branch pattern. No hemodynamically significant stenosis seen about the origin of the great vessels. Visualized subclavian arteries widely patent. RIGHT CAROTID SYSTEM: Right CCA patent from its origin to the bifurcation without stenosis. No more than mild atheromatous irregularity about the right bifurcation/proximal right ICA without significant stenosis. Right ICA patent distally without stenosis, evidence for dissection, or occlusion. LEFT CAROTID SYSTEM: Left CCA patent from its origin to the bifurcation without stenosis. No appreciable atheromatous irregularity or narrowing about the left bifurcation. Left ICA patent distally without stenosis, evidence for dissection, or occlusion. VERTEBRAL ARTERIES: Both vertebral arteries arise from the subclavian arteries. Vertebral arteries patent with antegrade flow, with no appreciable stenosis, evidence for dissection or occlusion. IMPRESSION: 1. Technically limited exam due to motion artifact. 2. Grossly negative MRA of the neck. No evidence for acute  traumatic vascular injury or other abnormality identified. 3. Wide patency of both carotid artery systems as well as the vertebral arteries within the neck. No hemodynamically significant stenosis. Electronically Signed   By: Rise Mu M.D.   On: 04/12/2021 01:42   MR CERVICAL SPINE WO CONTRAST  Result Date: 04/12/2021 CLINICAL DATA:  Initial evaluation for acute trauma, fall. EXAM: MRI CERVICAL SPINE WITHOUT CONTRAST TECHNIQUE: Multiplanar, multisequence MR imaging of the cervical spine was performed. No intravenous contrast was administered. COMPARISON:  Prior CT from 04/11/2021. FINDINGS: Alignment: Examination degraded by motion artifact. Vertebral bodies normally aligned with preservation of the normal cervical lordosis. No significant listhesis. Vertebrae: Vertebral body height maintained without acute or chronic fracture. Bone marrow signal intensity within normal limits. No worrisome osseous lesions. No abnormal marrow edema. Cord: Grossly normal signal and morphology on this motion degraded exam. No appreciable cord signal abnormality. No findings to suggest ligamentous injury. Posterior Fossa, vertebral arteries, paraspinal tissues: Visualized brain and posterior fossa within normal limits. Craniocervical junction normal. Paraspinous and prevertebral soft tissues demonstrate no acute finding. Normal flow voids seen within the vertebral arteries bilaterally. Disc levels: C2-C3: Small central disc protrusion indents the ventral thecal sac. No spinal stenosis or cord deformity. Left greater than right facet hypertrophy without significant foraminal  stenosis. C3-C4: Disc bulge with right greater than left uncovertebral hypertrophy. Mild facet degeneration. No spinal stenosis. Moderate right with mild left C4 foraminal stenosis. C4-C5: Disc bulge with bilateral uncovertebral hypertrophy. Left worse than right facet degeneration. No significant spinal stenosis. Moderate left C5 foraminal  narrowing. No significant right foraminal stenosis. C5-C6: Degenerative intervertebral disc space narrowing. Diffuse disc bulge with bilateral uncovertebral and endplate spurring. Posterior disc osteophyte flattens and partially effaces the ventral thecal sac with resultant mild spinal stenosis. No frank cord impingement. Moderate right worse than left C6 foraminal narrowing. C6-C7: Degenerative intervertebral disc space narrowing with diffuse disc osteophyte complex. Flattening and partial effacement of the ventral thecal sac with no more than mild spinal stenosis. No cord impingement. Moderate left with moderate to severe right C7 foraminal stenosis. C7-T1: Mild disc bulge. Bilateral facet hypertrophy. No spinal stenosis. Foramina appear grossly patent. Visualized upper thoracic spine demonstrates no significant finding. IMPRESSION: 1. No acute traumatic injury or other abnormality within the cervical spine. 2. Multilevel cervical spondylosis with resultant mild spinal stenosis at C5-6 and C6-7. 3. Multifactorial degenerative changes with resultant multilevel foraminal narrowing as above. Notable findings include moderate right C4 and left C5 foraminal stenosis, moderate right worse than left C6 foraminal narrowing, with moderate left and moderate to severe right C7 foraminal stenosis. Electronically Signed   By: Rise Mu M.D.   On: 04/12/2021 01:37   DG Swallowing Func-Speech Pathology  Result Date: 04/12/2021 Objective Swallowing Evaluation: Type of Study: MBS-Modified Barium Swallow Study  Patient Details Name: KODIE PICK MRN: [id number] Date of Birth: Feb 23, 1927 Today's Date: 04/12/2021 Time: SLP Start Time (ACUTE ONLY): 1244 -SLP Stop Time (ACUTE ONLY): 1300 SLP Time Calculation (min) (ACUTE ONLY): 16 min Past Medical History: Past Medical History: Diagnosis Date . Diabetes mellitus  . Fracture 10/2012  left wrist . Fracture of left clavicle   12/12 wore a sling . GERD (gastroesophageal  reflux disease)  . Hypertension  . Osteoporosis  . Pulmonary fibrosis (HCC)  Past Surgical History: Past Surgical History: Procedure Laterality Date . FEMUR FRACTURE SURGERY     6/08 partial hip replacement right . ganglion cyst removal    left wrist 1960 . HIP FRACTURE SURGERY    1984 pin and plate placed right . HIP SURGERY    pin and plate removed 03/980 . ORIF ELBOW FRACTURE Right 02/18/2013  Procedure: OPEN REDUCTION INTERNAL FIXATION (ORIF) ELBOW/OLECRANON FRACTURE;  Surgeon: Nadara Mustard, MD;  Location: [facility] OR;  Service: Orthopedics;  Laterality: Right; . ORIF ELBOW FRACTURE Left 05/09/2015  Procedure: OPEN REDUCTION INTERNAL FIXATION (ORIF) LEFT MEDIAL HUMERAL CONDYLE FRACTURE;  Surgeon: Tarry Kos, MD;  Location: [facility] OR;  Service: Orthopedics;  Laterality: Left; HPI: Pt is a 87 y.o. female who was brought to the ED after being found on the floor from fall.  Pt hit her head but did not lose consciousness. MRA5/12: Grossly negative. Head CT 5/12: No acute or traumatic finding. SLP was consulted after significant coughing event with intake of thin liquids. PMH: hypertension, diabetes mellitus, emphysema, GERD (on protonix).  No data recorded Assessment / Plan / Recommendation CHL IP CLINICAL IMPRESSIONS 04/12/2021 Clinical Impression Pt presents with pharyngeal dysphagia characterized by mildly reduced vallcular residue and a pharyngeal delay. Pt demonstrated vallecular residue which increased with bolus size and with advancement of solids. Vallecular residue was reduced and sometimes eliminated with a  liquid wash. Pharyngeal delay resulted in penetration (PAS 3, 5) of thin liquids during deglutition and aspiration (PAS  7) after deglutition. Penetration (PAS 3) was also noted with nectar thick liquids via straw. A chin tuck posture was effective in eliminating instances of penetration and aspiration, but this was most effective with thin liquids via cup. A regular texture diet with thin liquids via cup is  recommended at this time. SLP will follow to assess diet tolerance and for treatment. SLP Visit Diagnosis Dysphagia, pharyngeal phase (R13.13) Attention and concentration deficit following -- Frontal lobe and executive function deficit following -- Impact on safety and function Mild aspiration risk   CHL IP TREATMENT RECOMMENDATION 04/12/2021 Treatment Recommendations Therapy as outlined in treatment plan below   Prognosis 04/12/2021 Prognosis for Safe Diet Advancement Good Barriers to Reach Goals Time post onset Barriers/Prognosis Comment -- CHL IP DIET RECOMMENDATION 04/12/2021 SLP Diet Recommendations Regular solids;Thin liquid Liquid Administration via Cup;No straw Medication Administration Whole meds with puree Compensations Slow rate;Small sips/bites;Chin tuck Postural Changes Seated upright at 90 degrees   CHL IP OTHER RECOMMENDATIONS 04/12/2021 Recommended Consults -- Oral Care Recommendations Oral care BID Other Recommendations --   CHL IP FOLLOW UP RECOMMENDATIONS 04/12/2021 Follow up Recommendations (No Data)   CHL IP FREQUENCY AND DURATION 04/12/2021 Speech Therapy Frequency (ACUTE ONLY) min 2x/week Treatment Duration 2 weeks      CHL IP ORAL PHASE 04/12/2021 Oral Phase WFL Oral - Pudding Teaspoon -- Oral - Pudding Cup -- Oral - Honey Teaspoon -- Oral - Honey Cup -- Oral - Nectar Teaspoon -- Oral - Nectar Cup -- Oral - Nectar Straw -- Oral - Thin Teaspoon -- Oral - Thin Cup -- Oral - Thin Straw -- Oral - Puree -- Oral - Mech Soft -- Oral - Regular -- Oral - Multi-Consistency -- Oral - Pill -- Oral Phase - Comment --  CHL IP PHARYNGEAL PHASE 04/12/2021 Pharyngeal Phase Impaired Pharyngeal- Pudding Teaspoon -- Pharyngeal -- Pharyngeal- Pudding Cup -- Pharyngeal -- Pharyngeal- Honey Teaspoon -- Pharyngeal -- Pharyngeal- Honey Cup -- Pharyngeal -- Pharyngeal- Nectar Teaspoon -- Pharyngeal -- Pharyngeal- Nectar Cup Pharyngeal residue - valleculae;Delayed swallow initiation-vallecula Pharyngeal -- Pharyngeal- Nectar  Straw Pharyngeal residue - valleculae;Delayed swallow initiation-vallecula;Penetration/Aspiration during swallow Pharyngeal Material enters airway, remains ABOVE vocal cords and not ejected out Pharyngeal- Thin Teaspoon -- Pharyngeal -- Pharyngeal- Thin Cup Pharyngeal residue - valleculae;Delayed swallow initiation-vallecula;Penetration/Aspiration during swallow;Penetration/Apiration after swallow Pharyngeal Material enters airway, CONTACTS cords and not ejected out;Material enters airway, passes BELOW cords and not ejected out despite cough attempt by patient Pharyngeal- Thin Straw Pharyngeal residue - valleculae;Delayed swallow initiation-vallecula;Penetration/Aspiration during swallow;Penetration/Apiration after swallow Pharyngeal Material enters airway, CONTACTS cords and not ejected out;Material enters airway, passes BELOW cords and not ejected out despite cough attempt by patient Pharyngeal- Puree Pharyngeal residue - valleculae;Delayed swallow initiation-vallecula Pharyngeal -- Pharyngeal- Mechanical Soft -- Pharyngeal -- Pharyngeal- Regular Pharyngeal residue - valleculae;Delayed swallow initiation-vallecula Pharyngeal -- Pharyngeal- Multi-consistency -- Pharyngeal -- Pharyngeal- Pill Pharyngeal residue - valleculae;Delayed swallow initiation-vallecula Pharyngeal -- Pharyngeal Comment --  CHL IP CERVICAL ESOPHAGEAL PHASE 04/12/2021 Cervical Esophageal Phase WFL Pudding Teaspoon -- Pudding Cup -- Honey Teaspoon -- Honey Cup -- Nectar Teaspoon -- Nectar Cup -- Nectar Straw -- Thin Teaspoon -- Thin Cup -- Thin Straw -- Puree -- Mechanical Soft -- Regular -- Multi-consistency -- Pill -- Cervical Esophageal Comment -- Shanika I. Vear Clock, MS, CCC-SLP Acute Rehabilitation Services Office number [phone] Pager [phone] Scheryl Marten 04/12/2021, 2:53 PM              DG Hip Unilat W or Wo Pelvis 2-3 Views Left  Result  Date: 04/11/2021 CLINICAL DATA:  Left hip pain following a fall last night. EXAM: DG  HIP (WITH OR WITHOUT PELVIS) 2-3V LEFT COMPARISON:  Pelvis and right hip dated 05/09/2019. Abdomen and pelvis CT dated 10/04/2020. FINDINGS: Stable old, possible acute left inferior pubic ramus fracture at the location of a previously demonstrated old, healed fracture. There is also an acute, mildly comminuted and mildly displaced fracture of the left superior pubic ramus. No hip fracture or dislocation seen. Stable right hip prosthesis. Diffuse osteopenia. IMPRESSION: 1. Left superior and inferior pubic ramus fractures. 2. No hip fracture or dislocation. Electronically Signed   By: Beckie Salts M.D.   On: 04/11/2021 18:48        Scheduled Meds: . insulin aspart  0-9 Units Subcutaneous TID WC  . insulin glargine  8 Units Subcutaneous QHS  . losartan  50 mg Oral Daily  . mometasone-formoterol  2 puff Inhalation BID  . pantoprazole  40 mg Oral Daily  . sertraline  25 mg Oral QHS   Continuous Infusions: . methocarbamol (ROBAXIN) IV       LOS: 0 days    Time spent: 35 minutes    Berton Mount, MD  [facility] Pager #: [phone] 7PM-7AM contact night coverage as above

## 2021-04-14 DIAGNOSIS — F0150 Vascular dementia without behavioral disturbance: Secondary | ICD-10-CM

## 2021-04-14 DIAGNOSIS — R410 Disorientation, unspecified: Secondary | ICD-10-CM

## 2021-04-14 LAB — GLUCOSE, CAPILLARY
Glucose-Capillary: 124 mg/dL — ABNORMAL HIGH (ref 70–99)
Glucose-Capillary: 197 mg/dL — ABNORMAL HIGH (ref 70–99)
Glucose-Capillary: 212 mg/dL — ABNORMAL HIGH (ref 70–99)
Glucose-Capillary: 221 mg/dL — ABNORMAL HIGH (ref 70–99)

## 2021-04-14 NOTE — TOC Initial Note (Signed)
Transition of Care [facility]) - Initial/Assessment Note    Patient Details  Name: Lauren Avery MRN: [id number] Date of Birth: 01-25-1927  Transition of Care [facility]) CM/SW Contact:    Joanne Chars, LCSW Phone Number: 04/14/2021, 3:33 PM  Clinical Narrative:  CSW met with pt and daughter Santiago Glad to discuss recommendation for SNF.  Pt gives permission to speak with both daughters.  Pt agreeable to SNF referral, choice document given.  Permission to send info out in hub.  Daughter asks for [facility], which is near where daughter lives.  Pt is vaccinated for covid but not boosted.  Current equipment in home: 2 walkers, electric wheelchair, shower chair.                   Expected Discharge Plan: Skilled Nursing Facility Barriers to Discharge: Continued Medical Work up,SNF Pending bed offer   Patient Goals and CMS Choice Patient states their goals for this hospitalization and ongoing recovery are:: walking, "just go" CMS Medicare.gov Compare Post Acute Care list provided to:: Patient Represenative (must comment) Choice offered to / list presented to : Adult Children  Expected Discharge Plan and Services Expected Discharge Plan: [place] In-house Referral: Clinical Social Work   Post Acute Care Choice: [place] Living arrangements for the past 2 months: [place]                                      Prior Living Arrangements/Services Living arrangements for the past 2 months: Single Family Home Lives with:: Self Patient language and need for interpreter reviewed:: Yes Do you feel safe going back to the place where you live?: No   recent falls  Need for Family Participation in Patient Care: Yes (Comment) Care giver support system in place?: Yes (comment) Current home services: Homehealth aide (private duty) Criminal Activity/Legal Involvement Pertinent to Current Situation/Hospitalization: No - Comment as  needed  Activities of Daily Living Home Assistive Devices/Equipment: Environmental consultant (specify type) ADL Screening (condition at time of admission) Patient's cognitive ability adequate to safely complete daily activities?: Yes Is the patient deaf or have difficulty hearing?: Yes Does the patient have difficulty seeing, even when wearing glasses/contacts?: No Does the patient have difficulty concentrating, remembering, or making decisions?: No Patient able to express need for assistance with ADLs?: Yes Does the patient have difficulty dressing or bathing?: No Independently performs ADLs?: Yes (appropriate for developmental age) Does the patient have difficulty walking or climbing stairs?: Yes Weakness of Legs: Both Weakness of Arms/Hands: None  Permission Sought/Granted Permission sought to share information with : Family Chief Financial Officer Permission granted to share information with : Yes, Verbal Permission Granted  Share Information with NAME: daughters Helene Kelp and Santiago Glad  Permission granted to share info w AGENCY: SNF        Emotional Assessment Appearance:: Appears stated age Attitude/Demeanor/Rapport: Engaged Affect (typically observed): Pleasant Orientation: : Oriented to Self,Oriented to Place,Oriented to Situation Alcohol / Substance Use: Not Applicable Psych Involvement: No (comment)  Admission diagnosis:  Pelvic fracture ([place]) [S32.9XXA] Hematoma [T14.8XXA] Elevated CK [R74.8] Multiple closed fractures of pelvis without disruption of pelvic ring, initial encounter ([place]) [S32.82XA] Pubic ramus fracture ([place]) [S32.599A] Patient Active Problem List   Diagnosis Date Noted  . Pubic ramus fracture ([place]) 04/13/2021  . Pelvic fracture ([place]) 04/11/2021  . Diabetes mellitus type 2 in nonobese ([place]) 04/11/2021  .  Coagulation disorder ([place]) 05/02/2020  . Physical deconditioning 09/23/2017  . Laryngopharyngeal reflux (LPR) 09/20/2015  . Fracture of elbow, medial  condyle, left, closed 05/09/2015  . Fracture of medial condyle of elbow 05/09/2015  . Long term (current) use of anticoagulants 03/21/2013  . GERD (gastroesophageal reflux disease) 02/24/2013  . Constipation 02/24/2013  . Fracture of elbow, medial condyle, right, closed 02/19/2013    Class: Acute  . Closed fracture of single pubic ramus of pelvis ([place]) 02/19/2013    Class: Acute  . Bilateral sacral insufficiency fracture 02/19/2013    Class: Chronic  . IPF (idiopathic pulmonary fibrosis) ([place]) 02/24/2012  . Chronic cough 02/24/2012  . DM 11/12/2007  . Essential hypertension 11/12/2007   PCP:  Haywood Pao, MD Pharmacy:   [facility] [place], [place] [address] AT [address] [place] [address] Phone: [phone] Fax: [phone]     Social Determinants of Health (SDOH) Interventions    Readmission Risk Interventions No flowsheet data found.

## 2021-04-14 NOTE — Plan of Care (Signed)
  Problem: Education: Goal: Knowledge of General Education information will improve Description: Including pain rating scale, medication(s)/side effects and non-pharmacologic comfort measures Outcome: Progressing   Problem: Clinical Measurements: Goal: Respiratory complications will improve Outcome: Progressing   Problem: Nutrition: Goal: Adequate nutrition will be maintained Outcome: Progressing   Problem: Coping: Goal: Level of anxiety will decrease Outcome: Progressing   Problem: Elimination: Goal: Will not experience complications related to urinary retention Outcome: Progressing   Problem: Pain Managment: Goal: General experience of comfort will improve Outcome: Progressing   Problem: Safety: Goal: Ability to remain free from injury will improve Outcome: Progressing   

## 2021-04-14 NOTE — Progress Notes (Signed)
PROGRESS NOTE    Lauren Avery  WEX:[id number] DOB: 11/20/1927 DOA: 04/11/2021 PCP: Gaspar Garbe, MD  Outpatient Specialists:   Brief Narrative:  Patient is a 87 year old Caucasian female with past medical history significant for hypertension, diabetes mellitus and emphysema.  Patient was found on the floor after the patient's daughter went to have a routine visit.  Apparently, patient had fallen a day earlier.  According to the patient, she tripped and fell.  Work-up done revealed left superior and inferior pubic rami fracture.  Patient also has a large ecchymotic area on the right neck area but CT scan of the head and neck was unremarkable.  Orthopedic team was consulted to assist with patient's management.  The pubic rami fracture will be managed supportively.  During the hospital stay, patient has had episodes of confusion every night, with associated insomnia.  No prior documented history of dementia.  However, CT head revealed small vessel changes of the white matter.  Possibility of prior undiagnosed multi-infarct/vascular dementia has been entertained.  04/12/2021: Patient seen alongside 2 daughters and patient's nurse.  No new complaints.  Pain is controlled.  No surgery is planned.  For conservative management.  Awaiting physical therapy input.  There are concerns about possible aspiration.  Speech therapy has been consulted.  MRA neck is negative.  MRI cervical spine is negative for any acute traumatic injury.  04/13/2021: Patient seen alongside patient's nurse and patient's daughter.  Events of last night noted.  Apparently, patient barely slept last night, reported to have been agitated and confused.  No prior history of dementing illness.  Patient was living independently at home.  Patient's daughter reports that patient was coping at home.  Patient was able to count from 20 backwards to 1.  Patient was able to spell "world " backwards.  Patient knew the date, date, month and  year.  However, CT head revealed small vessel changes of the white matter.  Cannot entirely rule out possible underlying vascular dementia/multi-infarct dementia.  We will also check folate, B12.  Will repeat urinalysis and urine culture to rule out any organic component.  Difficulty bearing weight.  04/14/2021: Patient had another episode of confusion and wakefulness last night.  Continue to assess for possible vascular dementia versus acute delirium.  Folate is 13.3.  Vitamin B12 is 832.  The plan is to discharge patient to skilled nursing facility.   Assessment & Plan:   Principal Problem:   Pelvic fracture (HCC) Active Problems:   Essential hypertension   IPF (idiopathic pulmonary fibrosis) (HCC)   Diabetes mellitus type 2 in nonobese (HCC)   Pubic ramus fracture (HCC)  Left superior and inferior pubic rami fracture: -Status post mechanical fall  -For conservative management as per orthopedic team. -Pursue pain control. -Await PT OT input. 04/13/2021: Input from surgery, PT OT teams appreciated. 04/14/2021: Skilled nursing facility discharge.    Large ecchymotic area in the right neck area: -MRA neck is not revealing.  History of idiopathic pulmonary fibrosis/emphysema: -On inhalers.  Hypertension: -On losartan.  Diabetes mellitus type 2: -On sliding scale coverage.  Leukocytosis: -Likely reactionary.  Elevated CK levels: -Renal function remains normal.   -CPK is on the downward trend.  CPK today is 375.  Acute delirium/encephalopathy versus previously undiagnosed vascular dementia: -Etiology unclear. -Cannot entirely rule out possible underlying multi-infarct dementia/vascular dementia. -Continue work-up for possible organic causes. -Further management depend on hospital course.  DVT prophylaxis: SCD Code Status: Full code Family Communication: Daughters Disposition Plan: Await PT  evaluation.   Consultants:   Orthopedic team  Procedures:    None  Antimicrobials:   None   Subjective: Reported to have been agitated and confused this morning and last night.  Objective: Vitals:   04/13/21 2143 04/14/21 0825 04/14/21 0842 04/14/21 1515  BP: (!) 155/76 (!) 167/86  138/67  Pulse: 99 97  100  Resp: 18 17  17   Temp: 98.8 F (37.1 C) 98.1 F (36.7 C)  98.1 F (36.7 C)  TempSrc: Oral Oral  Oral  SpO2: 96% 93% 93% 94%  Weight:      Height:        Intake/Output Summary (Last 24 hours) at 04/14/2021 1756 Last data filed at 04/14/2021 1000 Gross per 24 hour  Intake 240 ml  Output 650 ml  Net -410 ml   Filed Weights   04/11/21 1735 04/11/21 2252  Weight: 51.2 kg 49 kg    Examination:  General exam: Appears calm and comfortable.  Ecchymosis around the anterior aspect of neck and upper chest area. Respiratory system: Clear to auscultation.  Cardiovascular system: S1 & S2 heard Gastrointestinal system: Abdomen is nondistended, soft and nontender. No organomegaly or masses felt. Normal bowel sounds heard. Central nervous system: Alert and oriented. No focal neurological deficits. Extremities: No leg edema  Data Reviewed: I have personally reviewed following labs and imaging studies  CBC: Recent Labs  Lab 04/11/21 1753 04/12/21 0330  WBC 13.5* 14.6*  NEUTROABS 11.0*  --   HGB 13.9 12.8  HCT 42.0 38.4  MCV 92.1 89.7  PLT 97* 85*   Basic Metabolic Panel: Recent Labs  Lab 04/11/21 1753 04/12/21 0330  NA 131* 133*  K 4.3 4.1  CL 97* 100  CO2 27 24  GLUCOSE 179* 163*  BUN 12 15  CREATININE 0.92 0.82  CALCIUM 10.0 9.7   GFR: Estimated Creatinine Clearance: 33 mL/min (by C-G formula based on SCr of 0.82 mg/dL). Liver Function Tests: Recent Labs  Lab 04/11/21 1753  AST 55*  ALT 29  ALKPHOS 116  BILITOT 4.2*  PROT 7.0  ALBUMIN 3.6   No results for input(s): LIPASE, AMYLASE in the last 168 hours. No results for input(s): AMMONIA in the last 168 hours. Coagulation Profile: No results for  input(s): INR, PROTIME in the last 168 hours. Cardiac Enzymes: Recent Labs  Lab 04/11/21 1753 04/12/21 0330  CKTOTAL 590* 375*   BNP (last 3 results) No results for input(s): PROBNP in the last 8760 hours. HbA1C: No results for input(s): HGBA1C in the last 72 hours. CBG: Recent Labs  Lab 04/13/21 1615 04/13/21 2057 04/14/21 0657 04/14/21 1108 04/14/21 1616  GLUCAP 178* 233* 124* 197* 212*   Lipid Profile: No results for input(s): CHOL, HDL, LDLCALC, TRIG, CHOLHDL, LDLDIRECT in the last 72 hours. Thyroid Function Tests: No results for input(s): TSH, T4TOTAL, FREET4, T3FREE, THYROIDAB in the last 72 hours. Anemia Panel: Recent Labs    04/13/21 1424  VITAMINB12 832  FOLATE 13.3   Urine analysis:    Component Value Date/Time   COLORURINE YELLOW 04/13/2021 2241   APPEARANCEUR HAZY (A) 04/13/2021 2241   LABSPEC 1.008 04/13/2021 2241   PHURINE 7.0 04/13/2021 2241   GLUCOSEU >=500 (A) 04/13/2021 2241   HGBUR SMALL (A) 04/13/2021 2241   BILIRUBINUR NEGATIVE 04/13/2021 2241   KETONESUR NEGATIVE 04/13/2021 2241   PROTEINUR NEGATIVE 04/13/2021 2241   UROBILINOGEN 0.2 02/17/2013 1933   NITRITE NEGATIVE 04/13/2021 2241   LEUKOCYTESUR LARGE (A) 04/13/2021 2241   Sepsis Labs: @LABRCNTIP (procalcitonin:4,lacticidven:4)  )  Recent Results (from the past 240 hour(s))  SARS CORONAVIRUS 2 (TAT 6-24 HRS) Nasopharyngeal Nasopharyngeal Swab     Status: None   Collection Time: 04/11/21  8:27 PM   Specimen: Nasopharyngeal Swab  Result Value Ref Range Status   SARS Coronavirus 2 NEGATIVE NEGATIVE Final    Comment: (NOTE) SARS-CoV-2 target nucleic acids are NOT DETECTED.  The SARS-CoV-2 RNA is generally detectable in upper and lower respiratory specimens during the acute phase of infection. Negative results do not preclude SARS-CoV-2 infection, do not rule out co-infections with other pathogens, and should not be used as the sole basis for treatment or other patient management  decisions. Negative results must be combined with clinical observations, patient history, and epidemiological information. The expected result is Negative.  Fact Sheet for Patients: HairSlick.no  Fact Sheet for Healthcare Providers: quierodirigir.com  This test is not yet approved or cleared by the [place] FDA and  has been authorized for detection and/or diagnosis of SARS-CoV-2 by FDA under an Emergency Use Authorization (EUA). This EUA will remain  in effect (meaning this test can be used) for the duration of the COVID-19 declaration under Se ction 564(b)(1) of the Act, 21 U.S.C. section 360bbb-3(b)(1), unless the authorization is terminated or revoked sooner.  Performed at [facility], [address], [place], [place] [address]          Radiology Studies: No results found.      Scheduled Meds: . insulin aspart  0-9 Units Subcutaneous TID WC  . insulin glargine  8 Units Subcutaneous QHS  . losartan  50 mg Oral Daily  . mometasone-formoterol  2 puff Inhalation BID  . pantoprazole  40 mg Oral Daily  . sertraline  25 mg Oral QHS   Continuous Infusions: . methocarbamol (ROBAXIN) IV       LOS: 1 day    Time spent: 25 minutes    Berton Mount, MD  [facility] Pager #: [phone] 7PM-7AM contact night coverage as above

## 2021-04-14 NOTE — NC FL2 (Signed)
[place] MEDICAID FL2 LEVEL OF CARE SCREENING TOOL     IDENTIFICATION  Patient Name: Lauren Avery Birthdate: Jun 27, 1927 Sex: female Admission Date (Current Location): 04/11/2021  [facility] and [place] Number:  Producer, television/film/video and Address:  [facility], [address] [place], [place] [address]      Provider Number: [id number]  Attending Physician Name and Address:  Barnetta Chapel, MD  Relative Name and Phone Number:  Rudene Re Daughter [phone]  [phone]    Current Level of Care: Hospital Recommended Level of Care: Skilled Nursing Facility Prior Approval Number:    Date Approved/Denied:   PASRR Number:    Discharge Plan: SNF    Current Diagnoses: Patient Active Problem List   Diagnosis Date Noted  . Pubic ramus fracture (HCC) 04/13/2021  . Pelvic fracture (HCC) 04/11/2021  . Diabetes mellitus type 2 in nonobese (HCC) 04/11/2021  . Coagulation disorder (HCC) 05/02/2020  . Physical deconditioning 09/23/2017  . Laryngopharyngeal reflux (LPR) 09/20/2015  . Fracture of elbow, medial condyle, left, closed 05/09/2015  . Fracture of medial condyle of elbow 05/09/2015  . Long term (current) use of anticoagulants 03/21/2013  . GERD (gastroesophageal reflux disease) 02/24/2013  . Constipation 02/24/2013  . Fracture of elbow, medial condyle, right, closed 02/19/2013  . Closed fracture of single pubic ramus of pelvis (HCC) 02/19/2013  . Bilateral sacral insufficiency fracture 02/19/2013  . IPF (idiopathic pulmonary fibrosis) (HCC) 02/24/2012  . Chronic cough 02/24/2012  . DM 11/12/2007  . Essential hypertension 11/12/2007    Orientation RESPIRATION BLADDER Height & Weight     Self,Situation,Place  Normal External catheter Weight: 108 lb 0.4 oz (49 kg) Height:  5\' 1"  (154.9 cm)  BEHAVIORAL SYMPTOMS/MOOD NEUROLOGICAL BOWEL NUTRITION STATUS      Continent Diet (Carb modified.  See discharge summary)  AMBULATORY STATUS  COMMUNICATION OF NEEDS Skin   Total Care Verbally Skin abrasions                       Personal Care Assistance Level of Assistance  Bathing,Feeding,Dressing Bathing Assistance: Limited assistance Feeding assistance: Independent Dressing Assistance: Limited assistance     Functional Limitations Info  Sight,Hearing,Speech Sight Info: Adequate Hearing Info: Adequate Speech Info: Adequate    SPECIAL CARE FACTORS FREQUENCY  PT (By licensed PT),OT (By licensed OT)     PT Frequency: 5x week OT Frequency: 5x week            Contractures Contractures Info: Not present    Additional Factors Info  Code Status,Allergies,Insulin Sliding Scale Code Status Info: full Allergies Info: Hydrochlorothiazide, Prilosec (Omeprazole   Insulin Sliding Scale Info: Novolog, 0-9 units 3x day with meals.  See discharge summary       Current Medications (04/14/2021):  This is the current hospital active medication list Current Facility-Administered Medications  Medication Dose Route Frequency Provider Last Rate Last Admin  . acetaminophen (TYLENOL) tablet 650 mg  650 mg Oral Q6H PRN 04/16/2021, MD   650 mg at 04/13/21 2147   Or  . acetaminophen (TYLENOL) suppository 650 mg  650 mg Rectal Q6H PRN 2148, MD      . insulin aspart (novoLOG) injection 0-9 Units  0-9 Units Subcutaneous TID WC Eduard Clos, MD   2 Units at 04/14/21 1218  . insulin glargine (LANTUS) injection 8 Units  8 Units Subcutaneous QHS 1219, MD   8 Units at 04/13/21 2147  . labetalol (NORMODYNE)  injection 10 mg  10 mg Intravenous Q2H PRN Eduard Clos, MD   10 mg at 04/12/21 0511  . losartan (COZAAR) tablet 50 mg  50 mg Oral Daily Eduard Clos, MD   50 mg at 04/14/21 1023  . methocarbamol (ROBAXIN) 500 mg in dextrose 5 % 50 mL IVPB  500 mg Intravenous Q6H PRN Eduard Clos, MD      . mometasone-formoterol [facility]) 200-5 MCG/ACT inhaler 2 puff  2 puff  Inhalation BID Eduard Clos, MD   2 puff at 04/14/21 [phone]  . morphine 2 MG/ML injection 0.5 mg  0.5 mg Intravenous Q2H PRN Eduard Clos, MD      . pantoprazole (PROTONIX) EC tablet 40 mg  40 mg Oral Daily Eduard Clos, MD   40 mg at 04/14/21 1023  . sertraline (ZOLOFT) tablet 25 mg  25 mg Oral QHS Eduard Clos, MD   25 mg at 04/13/21 2148     Discharge Medications: Please see discharge summary for a list of discharge medications.  Relevant Imaging Results:  Relevant Lab Results:   Additional Information SSN [id number].  Pt has been vaccinated for covid, no booster.  Lorri Frederick, LCSW

## 2021-04-15 DIAGNOSIS — S32592A Other specified fracture of left pubis, initial encounter for closed fracture: Secondary | ICD-10-CM

## 2021-04-15 LAB — URINE CULTURE

## 2021-04-15 LAB — GLUCOSE, CAPILLARY
Glucose-Capillary: 127 mg/dL — ABNORMAL HIGH (ref 70–99)
Glucose-Capillary: 247 mg/dL — ABNORMAL HIGH (ref 70–99)
Glucose-Capillary: 183 mg/dL — ABNORMAL HIGH (ref 70–99)
Glucose-Capillary: 155 mg/dL — ABNORMAL HIGH (ref 70–99)

## 2021-04-15 NOTE — Progress Notes (Signed)
SNF authorization started.  Initial clinical submitted via [employer] portal.

## 2021-04-15 NOTE — Progress Notes (Signed)
PROGRESS NOTE    [name]  OZD:[id number] DOB: 1927/02/26 DOA: 04/11/2021 PCP: Gaspar Garbe, MD   Brief Narrative: Lauren Avery is a 87 y.o. female with history of hypertension, diabetes mellitus type 2, IPF.  Patient presented secondary to being found down.  On admission she was found to have a superior/inferior pubic rami fracture on the left.  Orthopedic surgery was consulted with recommendations for conservative management.  Pain management.  PT/OT consulted with recommendations for SNF on discharge.  Admission was complicated by delirium which is improved.   Assessment & Plan:   Principal Problem:   Pelvic fracture (HCC) Active Problems:   Essential hypertension   IPF (idiopathic pulmonary fibrosis) (HCC)   Diabetes mellitus type 2 in nonobese (HCC)   Pubic ramus fracture (HCC)   Pelvic fracture Secondary to fall and involves left superior/inferior pubic rami. Orthopedic surgery consulted on admission and recommend no surgical management, WBAT. PT/OT consulted with recommendations for SNF on discharge.  Ecchymosis Located on right side of neck/upper chest. Likely related to recent fall. MRA neck without evidence of vascular injury.  IPF -Continue Dulera  Primary hypertension -Continue losartan  Diabetes mellitus, type 2 Last hemoglobin A1C from 2016. Patient is on Tresiba 8 units qhs as an outpatient. -Continue Lantus 8 units qhs and SSI  Leukocytosis Likely reactive secondary to fall.  GERD -Continue protonix  Depression -Continue Zoloft  Elevated CK Mild. Related to recent fall. CK of 590 on admission with improvement to 375.  Acute delirium Patient with underlying memory problems with concern for dementia/vascular dementia. Delirium is improved.  DVT prophylaxis: SCDs Code Status:   Code Status: Full Code Family Communication: Daughter at bedside Disposition Plan: Discharge to SNF when bed is available   Consultants:    Orthopedic surgery  Procedures:   None  Antimicrobials:  None    Subjective: Some pain of her left hip area with turning. No other concerns.  Objective: Vitals:   04/14/21 2001 04/14/21 2045 04/15/21 0700 04/15/21 1527  BP: (!) 128/58  (!) 149/70 (!) 120/59  Pulse: 100  95 96  Resp: 16 18 16 16   Temp: 98.5 F (36.9 C)  97.8 F (36.6 C) 98.2 F (36.8 C)  TempSrc: Oral  Axillary Oral  SpO2: 95% 95% 95% 96%  Weight:      Height:       No intake or output data in the 24 hours ending 04/15/21 1613 Filed Weights   04/11/21 1735 04/11/21 2252  Weight: 51.2 kg 49 kg    Examination:  General exam: Appears calm and comfortable Respiratory system: Clear to auscultation. Respiratory effort normal. Cardiovascular system: S1 & S2 heard, RRR. No murmurs, rubs, gallops or clicks. Gastrointestinal system: Abdomen is nondistended, soft and nontender. No organomegaly or masses felt. Normal bowel sounds heard. Central nervous system: Alert. No focal neurological deficits. Musculoskeletal: No edema. No calf tenderness Skin: No cyanosis. Large area of ecchymosis of right neck/right upper chest. No bruising noted around left pelvis. Psychiatry: Judgement and insight appear normal. Mood & affect appropriate.     Data Reviewed: I have personally reviewed following labs and imaging studies  CBC Lab Results  Component Value Date   WBC 14.6 (H) 04/12/2021   RBC 4.28 04/12/2021   HGB 12.8 04/12/2021   HCT 38.4 04/12/2021   MCV 89.7 04/12/2021   MCH 29.9 04/12/2021   PLT 85 (L) 04/12/2021   MCHC 33.3 04/12/2021   RDW 14.6 04/12/2021   LYMPHSABS 1.2  04/11/2021   MONOABS 1.1 (H) 04/11/2021   EOSABS 0.0 04/11/2021   BASOSABS 0.0 04/11/2021     Last metabolic panel Lab Results  Component Value Date   NA 133 (L) 04/12/2021   K 4.1 04/12/2021   CL 100 04/12/2021   CO2 24 04/12/2021   BUN 15 04/12/2021   CREATININE 0.82 04/12/2021   GLUCOSE 163 (H) 04/12/2021   GFRNONAA  >60 04/12/2021   GFRAA 56 (L) 05/10/2015   CALCIUM 9.7 04/12/2021   PROT 7.0 04/11/2021   ALBUMIN 3.6 04/11/2021   BILITOT 4.2 (H) 04/11/2021   ALKPHOS 116 04/11/2021   AST 55 (H) 04/11/2021   ALT 29 04/11/2021   ANIONGAP 9 04/12/2021    CBG (last 3)  Recent Labs    04/15/21 0614 04/15/21 1127 04/15/21 1608  GLUCAP 127* 247* 183*     GFR: Estimated Creatinine Clearance: 33 mL/min (by C-G formula based on SCr of 0.82 mg/dL).  Coagulation Profile: No results for input(s): INR, PROTIME in the last 168 hours.  Recent Results (from the past 240 hour(s))  SARS CORONAVIRUS 2 (TAT 6-24 HRS) Nasopharyngeal Nasopharyngeal Swab     Status: None   Collection Time: 04/11/21  8:27 PM   Specimen: Nasopharyngeal Swab  Result Value Ref Range Status   SARS Coronavirus 2 NEGATIVE NEGATIVE Final    Comment: (NOTE) SARS-CoV-2 target nucleic acids are NOT DETECTED.  The SARS-CoV-2 RNA is generally detectable in upper and lower respiratory specimens during the acute phase of infection. Negative results do not preclude SARS-CoV-2 infection, do not rule out co-infections with other pathogens, and should not be used as the sole basis for treatment or other patient management decisions. Negative results must be combined with clinical observations, patient history, and epidemiological information. The expected result is Negative.  Fact Sheet for Patients: HairSlick.no  Fact Sheet for Healthcare Providers: quierodirigir.com  This test is not yet approved or cleared by the [place] FDA and  has been authorized for detection and/or diagnosis of SARS-CoV-2 by FDA under an Emergency Use Authorization (EUA). This EUA will remain  in effect (meaning this test can be used) for the duration of the COVID-19 declaration under Se ction 564(b)(1) of the Act, 21 U.S.C. section 360bbb-3(b)(1), unless the authorization is terminated or revoked  sooner.  Performed at [facility], [address], [place], [place] [address]   Culture, Urine     Status: Abnormal   Collection Time: 04/13/21 10:00 PM   Specimen: Urine, Random  Result Value Ref Range Status   Specimen Description URINE, RANDOM  Final   Special Requests   Final    NONE Performed at [facility], [address] [place], [place] [address]    Culture MULTIPLE SPECIES PRESENT, SUGGEST RECOLLECTION (A)  Final   Report Status 04/15/2021 FINAL  Final        Radiology Studies: No results found.      Scheduled Meds: . insulin aspart  0-9 Units Subcutaneous TID WC  . insulin glargine  8 Units Subcutaneous QHS  . losartan  50 mg Oral Daily  . mometasone-formoterol  2 puff Inhalation BID  . pantoprazole  40 mg Oral Daily  . sertraline  25 mg Oral QHS   Continuous Infusions: . methocarbamol (ROBAXIN) IV       LOS: 2 days     Jacquelin Hawking, MD [facility] 04/15/2021, 4:13 PM  If 7PM-7AM, please contact night-coverage [email or web address]

## 2021-04-15 NOTE — TOC Progression Note (Addendum)
Transition of Care [facility]) - Progression Note    Patient Details  Name: Lauren Avery MRN: [id number] Date of Birth: 05/16/1927  Transition of Care [facility]) CM/SW Contact  Milinda Antis, LCSWA Phone Number: 04/15/2021, 12:43 PM  Clinical Narrative:     CSW met with the patient and adult daughter, Clarene Critchley, at bedside.  The patient was oriented x3.  Patient was slightly disoriented to time.  Bed offers were presented to the patient and family.  The adult daughter would like to visit [facility] SNF prior to choosing a facility.  CSW arranged for the daughter to visit the facility at 3pm today.  CSW completed a screening for a PASRR for the patient.  The PASRR number is [id number].  CSW contacted the [facility] CMAs to begin insurance authorization process with the SNF pending.    12:55-  CSW received a call from the patient's daughter, Clarene Critchley. The family would like to cancel the tour of [facility] and look at other facilities.    15:31-  CSW received a call from patient's adult daughter.  Patient choose [facility].  The patient would like to receive a COVID booster shot prior to d/c.  [facility] contacted and informed of patient's choice.  TOC CMAs informed of choice so that insurance auth. Could be updated.  Attending and RN informed of the need for COVID test prior to admission to SNF and patient's request for COVID booster.  Expected Discharge Plan: [place] Barriers to Discharge: Continued Medical Work up,SNF Pending bed offer  Expected Discharge Plan and Services Expected Discharge Plan: [place] In-house Referral: Clinical Social Work   Post Acute Care Choice: [place] Living arrangements for the past 2 months: Single Family Home                                       Social Determinants of Health (SDOH) Interventions    Readmission Risk Interventions No flowsheet data found.

## 2021-04-15 NOTE — Plan of Care (Signed)
  Problem: Clinical Measurements: Goal: Ability to maintain clinical measurements within normal limits will improve Outcome: Progressing Goal: Will remain free from infection Outcome: Progressing   

## 2021-04-15 NOTE — Progress Notes (Signed)
Physical Therapy Treatment Patient Details Name: Lauren Avery MRN: [id number] DOB: 1927-04-30 Today's Date: 04/15/2021    History of Present Illness Patient is a 87 y/o female who presents with left sup/inf pubic rami fx s/p fall. PMH includes HTN, pulmonary fibrosis, DM, osteoporosis, ORIF left humerus.    PT Comments    Pt did very well getting up OOB to the [facility] and recliner chair using the stedy standing frame.  She was able to power up and stabilize with her knees blocked by standing frame bars.  She participated in seated UE and LE exercises and daughter was present throughout.  Might be wise to wear a depends for future OOB attempts as she is incontinent.  PT will continue to follow acutely for safe mobility progression.   Follow Up Recommendations  SNF     Equipment Recommendations  Wheelchair (measurements PT);Wheelchair cushion (measurements PT);Hospital bed    Recommendations for Other Services       Precautions / Restrictions Precautions Precautions: Fall    Mobility  Bed Mobility Overal bed mobility: Needs Assistance Bed Mobility: Supine to Sit   Sidelying to sit: Mod assist;HOB elevated       General bed mobility comments: Mod assist to help progress leg to EOB and support trunk to come up to sitting.  Very painful transitions.    Transfers Overall transfer level: Needs assistance   Transfers: Sit to/from Stand Sit to Stand: Min assist         General transfer comment: Min assist to come up to standing from bed and BSC to the stedy standing frame.  Transferred once in standing and once in sitting in the stander  Ambulation/Gait                 Stairs             Wheelchair Mobility    Modified Rankin (Stroke Patients Only)       Balance Overall balance assessment: Needs assistance Sitting-balance support: Feet supported;Bilateral upper extremity supported Sitting balance-Leahy Scale: Poor Sitting balance - Comments:  reliant on hands to keep her sitting EOB. Postural control: Posterior lean Standing balance support: Bilateral upper extremity supported Standing balance-Leahy Scale: Poor Standing balance comment: needs min assist in standing and external support from standing frame.                            Cognition Arousal/Alertness: Awake/alert Behavior During Therapy: WFL for tasks assessed/performed                                   General Comments: Pt seems mildly reliant on daughter in room to provide accurate answers, seems like it is likely close to her baseline cognition.      Exercises General Exercises - Upper Extremity Shoulder Flexion: AROM;Both;10 reps Elbow Flexion: AROM;Both;10 reps General Exercises - Lower Extremity Ankle Circles/Pumps: AROM;Both;20 reps Long Arc Quad: AROM;Both;10 reps Hip Flexion/Marching: AAROM;Both;10 reps    General Comments        Pertinent Vitals/Pain Pain Assessment: Faces Faces Pain Scale: Hurts even more Pain Location: pelvis with movement; right flank Pain Descriptors / Indicators: Grimacing;Guarding Pain Intervention(s): Monitored during session;Limited activity within patient's tolerance;Repositioned    Home Living                      Prior Function  PT Goals (current goals can now be found in the care plan section) Acute Rehab PT Goals Patient Stated Goal: to get better and go home Progress towards PT goals: Progressing toward goals    Frequency    Min 2X/week      PT Plan Current plan remains appropriate    Co-evaluation              AM-PAC PT "6 Clicks" Mobility   Outcome Measure  Help needed turning from your back to your side while in a flat bed without using bedrails?: A Little Help needed moving from lying on your back to sitting on the side of a flat bed without using bedrails?: A Lot Help needed moving to and from a bed to a chair (including a wheelchair)?:  A Lot Help needed standing up from a chair using your arms (e.g., wheelchair or bedside chair)?: A Little Help needed to walk in hospital room?: A Lot Help needed climbing 3-5 steps with a railing? : Total 6 Click Score: 13    End of Session Equipment Utilized During Treatment: Gait belt Activity Tolerance: Patient limited by pain;Patient limited by fatigue;Other (comment) (by bowel and bladder incontinence) Patient left: in chair;with call bell/phone within reach;with chair alarm set;with family/visitor present Nurse Communication: Need for lift equipment;Other (comment) (need for stadning frame to RN tech) PT Visit Diagnosis: Pain;Muscle weakness (generalized) (M62.81);Difficulty in walking, not elsewhere classified (R26.2);Unsteadiness on feet (R26.81) Pain - Right/Left: Right Pain - part of body:  (pelvis)     Time: 8016-5537 (no charge for clean up after incontinent episode) PT Time Calculation (min) (ACUTE ONLY): 53 min  Charges:  $Therapeutic Exercise: 8-22 mins $Therapeutic Activity: 23-37 mins                     Corinna Capra, PT, DPT  Acute Rehabilitation [phone] pager [phone]) [phone] office

## 2021-04-15 NOTE — Progress Notes (Signed)
[facility]: [id number].  Approved for 5/17-5/19/22, next review date 04/18/21.  SICC is Technical brewer.

## 2021-04-15 NOTE — Progress Notes (Signed)
  Speech Language Pathology Treatment: Dysphagia  Patient Details Name: Lauren Avery MRN: [id number] DOB: 1927-07-17 Today's Date: 04/15/2021 Time: 3267-1245 SLP Time Calculation (min) (ACUTE ONLY): 15 min  Assessment / Plan / Recommendation Clinical Impression  [name] was upright in bed with daughter present, reporting excellent adherence to chin tuck strategy. Pt had recently finished her breakfast of sausage and egg biscuit and coffee. Pt/daughter deny any coughing during or after PO intake, but do endorse significant coughing after recent breathing treatment. Pt with PMH of emphysema and GERD. She was seen with thin liquid via cup with independent use of chin tuck. With ice water, pt had strong cough response x2, but once switched to room temperature water, pt had no s/s aspiration. Suspect cough response was more related to GERD/emphysema and recent breathing treatment, however, daughter and patient were educated on s/s aspiration and the need to let medical staff know if she is experiencing increased coughing with PO intake. Recommend use of room temperature liquids. Continue thin liquids and regular solids with meds taken whole in puree. ST recommended at next level of care.    HPI HPI: Pt is a 87 y.o. female who was brought to the ED after being found on the floor from fall.  Pt hit her head but did not lose consciousness. MRA5/12: Grossly negative. Head CT 5/12: No acute or traumatic finding. SLP was consulted after significant coughing event with intake of thin liquids. PMH: hypertension, diabetes mellitus, emphysema, GERD (on protonix).      SLP Plan  Continue with current plan of care       Recommendations  Liquids provided via: Cup;No straw Medication Administration: Whole meds with puree Supervision: Full supervision/cueing for compensatory strategies Compensations: Slow rate;Small sips/bites;Chin tuck Postural Changes and/or Swallow Maneuvers: Seated upright 90  degrees;Chin tuck               Oral Care Recommendations: Oral care BID;Staff/trained caregiver to provide oral care Follow up Recommendations:  (Continued SLP services at level of care recommended by PT/OT) SLP Visit Diagnosis: Dysphagia, pharyngeal phase (R13.13) Plan: Continue with current plan of care                     [name] P. Kahla Risdon, M.S., CCC-SLP Speech-Language Pathologist Acute Rehabilitation Services Pager: [phone]  Susanne Borders Jenniger Figiel 04/15/2021, 9:25 AM

## 2021-04-16 LAB — GLUCOSE, CAPILLARY
Glucose-Capillary: 121 mg/dL — ABNORMAL HIGH (ref 70–99)
Glucose-Capillary: 201 mg/dL — ABNORMAL HIGH (ref 70–99)
Glucose-Capillary: 147 mg/dL — ABNORMAL HIGH (ref 70–99)
Glucose-Capillary: 200 mg/dL — ABNORMAL HIGH (ref 70–99)

## 2021-04-16 LAB — SARS CORONAVIRUS 2 (TAT 6-24 HRS): SARS Coronavirus 2: NEGATIVE

## 2021-04-16 MED ORDER — COVID-19 MRNA VAC-TRIS(PFIZER) 30 MCG/0.3ML IM SUSP
0.3000 mL | Freq: Once | INTRAMUSCULAR | Status: AC
  Administered 2021-04-16: 0.3 mL via INTRAMUSCULAR
  Filled 2021-04-16: qty 0.3

## 2021-04-16 NOTE — Discharge Summary (Signed)
Physician Discharge Summary  Lauren Avery ZOX:[id number] DOB: 1926-12-25 DOA: 04/11/2021  PCP: Gaspar Garbe, MD  Admit date: 04/11/2021 Discharge date: 04/16/2021  Admitted From: Home Disposition: SNF  Recommendations for Outpatient Follow-up:  1. Follow up with PCP in 1 week 2. Please follow up on the following pending results: None  Discharge Condition: Stable CODE STATUS: Full code Diet recommendation:  Diet recommendations: Regular;Thin liquid Liquids provided via: Cup;No straw Medication Administration: Whole meds with puree Supervision: Full supervision/cueing for compensatory strategies Compensations: Slow rate;Small sips/bites;Chin tuck Postural Changes and/or Swallow Maneuvers: Seated upright 90 degrees;Chin tuck   Brief/Interim Summary:  Admission HPI written by Midge Minium, MD   HPI: Lauren Avery is a 87 y.o. female with history of hypertension, diabetes mellitus, emphysema was brought to the ER after patient was found on the floor after the patient's daughter went to have a routine visit this afternoon.  Patient states she fell yesterday probably after tripping on something and was unable to get up.  She did hit her head but did not lose consciousness.    Hospital course:  Pelvic fracture Secondary to fall and involves left superior/inferior pubic rami. Orthopedic surgery consulted on admission and recommend no surgical management, WBAT. PT/OT consulted with recommendations for SNF on discharge.  Ecchymosis Located on right side of neck/upper chest. Likely related to recent fall. MRA neck without evidence of vascular injury.  IPF Continue Advair.  Primary hypertension Continue losartan  Diabetes mellitus, type 2 Last hemoglobin A1C from 2016. Patient is on [place] 8 units qhs as an outpatient. Continue on discharge.  Dysphagia, pharyngeal phase Diet recommendations: Regular;Thin liquid Liquids provided via: Cup;No  straw Medication Administration: Whole meds with puree Supervision: Full supervision/cueing for compensatory strategies Compensations: Slow rate;Small sips/bites;Chin tuck Postural Changes and/or Swallow Maneuvers: Seated upright 90 degrees;Chin tuck   Leukocytosis Likely reactive secondary to fall.  GERD Continue protonix  Depression Continue Zoloft  Elevated CK Mild. Related to recent fall. CK of 590 on admission with improvement to 375.  Acute delirium Patient with underlying memory problems with concern for dementia/vascular dementia. Delirium is resolved.  Discharge Diagnoses:  Principal Problem:   Pelvic fracture (HCC) Active Problems:   Essential hypertension   IPF (idiopathic pulmonary fibrosis) (HCC)   Diabetes mellitus type 2 in nonobese [facility])   Pubic ramus fracture (HCC)    Discharge Instructions   Allergies as of 04/16/2021      Reactions   Hydrochlorothiazide Other (See Comments), Itching   Taken from faxed notes of dr Wylene Simmer (to short stay)  THROMBOCYTOPENIA Taken from faxed notes of dr Wylene Simmer (to short stay)  THROMBOCYTOPENIA   Prilosec [omeprazole] Diarrhea      Medication List    STOP taking these medications   HYDROcodone-acetaminophen 5-325 MG tablet Commonly known as: NORCO/VICODIN   HYDROcodone-homatropine 5-1.5 MG/5ML syrup Commonly known as: HYCODAN   traMADol 50 MG tablet Commonly known as: ULTRAM     TAKE these medications   acetaminophen 325 MG tablet Commonly known as: TYLENOL Take 2 tablets (650 mg total) by mouth every 6 (six) hours as needed. What changed: reasons to take this   Advair Diskus 500-50 MCG/ACT Aepb Generic drug: fluticasone-salmeterol INHALE 1 PUFF INTO THE LUNGS TWICE DAILY What changed:   how much to take  how to take this  when to take this  additional instructions   losartan 50 MG tablet Commonly known as: COZAAR Take 50 mg by mouth daily.   NovoFine Plus 32G  X 4 MM Misc Generic drug:  Insulin Pen Needle U UTD WITH TRESIBA   OneTouch Verio test strip Generic drug: glucose blood USE TO SELF MONITOR BLOOD GLUCOSE 2 TIMES DAILY AND AS NEEDED   pantoprazole 40 MG tablet Commonly known as: Protonix Take 1 tablet (40 mg total) by mouth 2 (two) times daily. What changed: when to take this   sertraline 25 MG tablet Commonly known as: ZOLOFT Take 25 mg by mouth at bedtime.   Evaristo Bury FlexTouch 100 UNIT/ML FlexTouch Pen Generic drug: insulin degludec Inject 8 Units into the skin at bedtime.       Contact information for after-discharge care    Destination    [facility] Preferred SNF .   Service: Skilled Nursing Contact information: [address] [place] [address] [phone]                 Allergies  Allergen Reactions  . Hydrochlorothiazide Other (See Comments) and Itching    Taken from faxed notes of dr Wylene Simmer (to short stay)  THROMBOCYTOPENIA Taken from faxed notes of dr Wylene Simmer (to short stay)  THROMBOCYTOPENIA  . Prilosec [Omeprazole] Diarrhea    Consultations:  Orthopedic surgery   Procedures/Studies: DG Chest 1 View  Result Date: 04/11/2021 CLINICAL DATA:  Left hip pain following a fall last night. EXAM: CHEST  1 VIEW COMPARISON:  12/28/2020 FINDINGS: Interval mildly enlarged cardiac silhouette. Tortuous aorta. Stable bilateral interstitial fibrosis. Diffuse osteopenia. Interval increased compression deformity of the T12 vertebral body. Stable T10 vertebral body compression deformity. Possible interval T8 vertebral body compression deformity. Old, healed left posterior 2nd rib fracture. IMPRESSION: 1. Interval mild cardiomegaly. 2. Stable interstitial fibrosis. 3. Progressive thoracolumbar vertebral compression deformities. Electronically Signed   By: Beckie Salts M.D.   On: 04/11/2021 18:52   CT Head Wo Contrast  Result Date: 04/11/2021 CLINICAL DATA:  Larey Seat last night.  Subsequent mental status changes. EXAM:  CT HEAD WITHOUT CONTRAST CT CERVICAL SPINE WITHOUT CONTRAST TECHNIQUE: Multidetector CT imaging of the head and cervical spine was performed following the standard protocol without intravenous contrast. Multiplanar CT image reconstructions of the cervical spine were also generated. COMPARISON:  02/17/2013 FINDINGS: CT HEAD FINDINGS Brain: Mild age related volume loss. Mild chronic small-vessel change of the white matter. No sign of acute infarction, mass lesion, hemorrhage, hydrocephalus or extra-axial collection. Vascular: There is atherosclerotic calcification of the major vessels at the base of the brain. Skull: Negative Sinuses/Orbits: Clear except for a tiny amount of fluid layering in the right maxillary sinus, not likely significant. Other: None CT CERVICAL SPINE FINDINGS Alignment: Normal alignment. Skull base and vertebrae: No regional fracture or focal bone lesion. Soft tissues and spinal canal: Negative Disc levels: Mild spondylosis and facet osteoarthritis. No apparent compressive stenosis of the canal or foramina. Upper chest: Emphysema and scarring. Other: None IMPRESSION: Head CT: No acute or traumatic finding. Mild age related volume loss and small-vessel change of the white matter. Cervical spine CT: No acute or traumatic finding. Ordinary mild spondylosis and facet osteoarthritis. Emphysema (ICD10-J43.9). Electronically Signed   By: Paulina Fusi M.D.   On: 04/11/2021 18:59   CT Cervical Spine Wo Contrast  Result Date: 04/11/2021 CLINICAL DATA:  Larey Seat last night.  Subsequent mental status changes. EXAM: CT HEAD WITHOUT CONTRAST CT CERVICAL SPINE WITHOUT CONTRAST TECHNIQUE: Multidetector CT imaging of the head and cervical spine was performed following the standard protocol without intravenous contrast. Multiplanar CT image reconstructions of the cervical spine were also generated.  COMPARISON:  02/17/2013 FINDINGS: CT HEAD FINDINGS Brain: Mild age related volume loss. Mild chronic small-vessel  change of the white matter. No sign of acute infarction, mass lesion, hemorrhage, hydrocephalus or extra-axial collection. Vascular: There is atherosclerotic calcification of the major vessels at the base of the brain. Skull: Negative Sinuses/Orbits: Clear except for a tiny amount of fluid layering in the right maxillary sinus, not likely significant. Other: None CT CERVICAL SPINE FINDINGS Alignment: Normal alignment. Skull base and vertebrae: No regional fracture or focal bone lesion. Soft tissues and spinal canal: Negative Disc levels: Mild spondylosis and facet osteoarthritis. No apparent compressive stenosis of the canal or foramina. Upper chest: Emphysema and scarring. Other: None IMPRESSION: Head CT: No acute or traumatic finding. Mild age related volume loss and small-vessel change of the white matter. Cervical spine CT: No acute or traumatic finding. Ordinary mild spondylosis and facet osteoarthritis. Emphysema (ICD10-J43.9). Electronically Signed   By: Paulina Fusi M.D.   On: 04/11/2021 18:59   MR ANGIO NECK W WO CONTRAST  Result Date: 04/12/2021 CLINICAL DATA:  Initial evaluation for acute trauma, fall. EXAM: MRA NECK WITHOUT AND WITH CONTRAST TECHNIQUE: Multiplanar and multiecho pulse sequences of the neck were obtained without and with intravenous contrast. Angiographic images of the neck were obtained using MRA technique without and with intravenous contrast. CONTRAST:  5mL GADAVIST GADOBUTROL 1 MMOL/ML IV SOLN COMPARISON:  None. FINDINGS: Examination moderately to severely degraded by motion artifact. AORTIC ARCH: Visualized aortic arch normal in caliber with normal branch pattern. No hemodynamically significant stenosis seen about the origin of the great vessels. Visualized subclavian arteries widely patent. RIGHT CAROTID SYSTEM: Right CCA patent from its origin to the bifurcation without stenosis. No more than mild atheromatous irregularity about the right bifurcation/proximal right ICA without  significant stenosis. Right ICA patent distally without stenosis, evidence for dissection, or occlusion. LEFT CAROTID SYSTEM: Left CCA patent from its origin to the bifurcation without stenosis. No appreciable atheromatous irregularity or narrowing about the left bifurcation. Left ICA patent distally without stenosis, evidence for dissection, or occlusion. VERTEBRAL ARTERIES: Both vertebral arteries arise from the subclavian arteries. Vertebral arteries patent with antegrade flow, with no appreciable stenosis, evidence for dissection or occlusion. IMPRESSION: 1. Technically limited exam due to motion artifact. 2. Grossly negative MRA of the neck. No evidence for acute traumatic vascular injury or other abnormality identified. 3. Wide patency of both carotid artery systems as well as the vertebral arteries within the neck. No hemodynamically significant stenosis. Electronically Signed   By: Rise Mu M.D.   On: 04/12/2021 01:42   MR CERVICAL SPINE WO CONTRAST  Result Date: 04/12/2021 CLINICAL DATA:  Initial evaluation for acute trauma, fall. EXAM: MRI CERVICAL SPINE WITHOUT CONTRAST TECHNIQUE: Multiplanar, multisequence MR imaging of the cervical spine was performed. No intravenous contrast was administered. COMPARISON:  Prior CT from 04/11/2021. FINDINGS: Alignment: Examination degraded by motion artifact. Vertebral bodies normally aligned with preservation of the normal cervical lordosis. No significant listhesis. Vertebrae: Vertebral body height maintained without acute or chronic fracture. Bone marrow signal intensity within normal limits. No worrisome osseous lesions. No abnormal marrow edema. Cord: Grossly normal signal and morphology on this motion degraded exam. No appreciable cord signal abnormality. No findings to suggest ligamentous injury. Posterior Fossa, vertebral arteries, paraspinal tissues: Visualized brain and posterior fossa within normal limits. Craniocervical junction normal.  Paraspinous and prevertebral soft tissues demonstrate no acute finding. Normal flow voids seen within the vertebral arteries bilaterally. Disc levels: C2-C3: Small central disc protrusion indents  the ventral thecal sac. No spinal stenosis or cord deformity. Left greater than right facet hypertrophy without significant foraminal stenosis. C3-C4: Disc bulge with right greater than left uncovertebral hypertrophy. Mild facet degeneration. No spinal stenosis. Moderate right with mild left C4 foraminal stenosis. C4-C5: Disc bulge with bilateral uncovertebral hypertrophy. Left worse than right facet degeneration. No significant spinal stenosis. Moderate left C5 foraminal narrowing. No significant right foraminal stenosis. C5-C6: Degenerative intervertebral disc space narrowing. Diffuse disc bulge with bilateral uncovertebral and endplate spurring. Posterior disc osteophyte flattens and partially effaces the ventral thecal sac with resultant mild spinal stenosis. No frank cord impingement. Moderate right worse than left C6 foraminal narrowing. C6-C7: Degenerative intervertebral disc space narrowing with diffuse disc osteophyte complex. Flattening and partial effacement of the ventral thecal sac with no more than mild spinal stenosis. No cord impingement. Moderate left with moderate to severe right C7 foraminal stenosis. C7-T1: Mild disc bulge. Bilateral facet hypertrophy. No spinal stenosis. Foramina appear grossly patent. Visualized upper thoracic spine demonstrates no significant finding. IMPRESSION: 1. No acute traumatic injury or other abnormality within the cervical spine. 2. Multilevel cervical spondylosis with resultant mild spinal stenosis at C5-6 and C6-7. 3. Multifactorial degenerative changes with resultant multilevel foraminal narrowing as above. Notable findings include moderate right C4 and left C5 foraminal stenosis, moderate right worse than left C6 foraminal narrowing, with moderate left and moderate to  severe right C7 foraminal stenosis. Electronically Signed   By: Rise Mu M.D.   On: 04/12/2021 01:37   DG Swallowing Func-Speech Pathology  Result Date: 04/12/2021 Objective Swallowing Evaluation: Type of Study: MBS-Modified Barium Swallow Study  Patient Details Name: JASILYN HOLDERMAN MRN: [id number] Date of Birth: 08-26-1927 Today's Date: 04/12/2021 Time: SLP Start Time (ACUTE ONLY): 1244 -SLP Stop Time (ACUTE ONLY): 1300 SLP Time Calculation (min) (ACUTE ONLY): 16 min Past Medical History: Past Medical History: Diagnosis Date . Diabetes mellitus  . Fracture 10/2012  left wrist . Fracture of left clavicle   12/12 wore a sling . GERD (gastroesophageal reflux disease)  . Hypertension  . Osteoporosis  . Pulmonary fibrosis (HCC)  Past Surgical History: Past Surgical History: Procedure Laterality Date . FEMUR FRACTURE SURGERY     6/08 partial hip replacement right . ganglion cyst removal    left wrist 1960 . HIP FRACTURE SURGERY    1984 pin and plate placed right . HIP SURGERY    pin and plate removed 01/8412 . ORIF ELBOW FRACTURE Right 02/18/2013  Procedure: OPEN REDUCTION INTERNAL FIXATION (ORIF) ELBOW/OLECRANON FRACTURE;  Surgeon: Nadara Mustard, MD;  Location: [facility] OR;  Service: Orthopedics;  Laterality: Right; . ORIF ELBOW FRACTURE Left 05/09/2015  Procedure: OPEN REDUCTION INTERNAL FIXATION (ORIF) LEFT MEDIAL HUMERAL CONDYLE FRACTURE;  Surgeon: Tarry Kos, MD;  Location: [facility] OR;  Service: Orthopedics;  Laterality: Left; HPI: Pt is a 87 y.o. female who was brought to the ED after being found on the floor from fall.  Pt hit her head but did not lose consciousness. MRA5/12: Grossly negative. Head CT 5/12: No acute or traumatic finding. SLP was consulted after significant coughing event with intake of thin liquids. PMH: hypertension, diabetes mellitus, emphysema, GERD (on protonix).  No data recorded Assessment / Plan / Recommendation CHL IP CLINICAL IMPRESSIONS 04/12/2021 Clinical Impression Pt presents with  pharyngeal dysphagia characterized by mildly reduced vallcular residue and a pharyngeal delay. Pt demonstrated vallecular residue which increased with bolus size and with advancement of solids. Vallecular residue was reduced and sometimes eliminated with a  liquid wash. Pharyngeal delay resulted in penetration (PAS 3, 5) of thin liquids during deglutition and aspiration (PAS  7) after deglutition. Penetration (PAS 3) was also noted with nectar thick liquids via straw. A chin tuck posture was effective in eliminating instances of penetration and aspiration, but this was most effective with thin liquids via cup. A regular texture diet with thin liquids via cup is recommended at this time. SLP will follow to assess diet tolerance and for treatment. SLP Visit Diagnosis Dysphagia, pharyngeal phase (R13.13) Attention and concentration deficit following -- Frontal lobe and executive function deficit following -- Impact on safety and function Mild aspiration risk   CHL IP TREATMENT RECOMMENDATION 04/12/2021 Treatment Recommendations Therapy as outlined in treatment plan below   Prognosis 04/12/2021 Prognosis for Safe Diet Advancement Good Barriers to Reach Goals Time post onset Barriers/Prognosis Comment -- CHL IP DIET RECOMMENDATION 04/12/2021 SLP Diet Recommendations Regular solids;Thin liquid Liquid Administration via Cup;No straw Medication Administration Whole meds with puree Compensations Slow rate;Small sips/bites;Chin tuck Postural Changes Seated upright at 90 degrees   CHL IP OTHER RECOMMENDATIONS 04/12/2021 Recommended Consults -- Oral Care Recommendations Oral care BID Other Recommendations --   CHL IP FOLLOW UP RECOMMENDATIONS 04/12/2021 Follow up Recommendations (No Data)   CHL IP FREQUENCY AND DURATION 04/12/2021 Speech Therapy Frequency (ACUTE ONLY) min 2x/week Treatment Duration 2 weeks      CHL IP ORAL PHASE 04/12/2021 Oral Phase WFL Oral - Pudding Teaspoon -- Oral - Pudding Cup -- Oral - Honey Teaspoon -- Oral -  Honey Cup -- Oral - Nectar Teaspoon -- Oral - Nectar Cup -- Oral - Nectar Straw -- Oral - Thin Teaspoon -- Oral - Thin Cup -- Oral - Thin Straw -- Oral - Puree -- Oral - Mech Soft -- Oral - Regular -- Oral - Multi-Consistency -- Oral - Pill -- Oral Phase - Comment --  CHL IP PHARYNGEAL PHASE 04/12/2021 Pharyngeal Phase Impaired Pharyngeal- Pudding Teaspoon -- Pharyngeal -- Pharyngeal- Pudding Cup -- Pharyngeal -- Pharyngeal- Honey Teaspoon -- Pharyngeal -- Pharyngeal- Honey Cup -- Pharyngeal -- Pharyngeal- Nectar Teaspoon -- Pharyngeal -- Pharyngeal- Nectar Cup Pharyngeal residue - valleculae;Delayed swallow initiation-vallecula Pharyngeal -- Pharyngeal- Nectar Straw Pharyngeal residue - valleculae;Delayed swallow initiation-vallecula;Penetration/Aspiration during swallow Pharyngeal Material enters airway, remains ABOVE vocal cords and not ejected out Pharyngeal- Thin Teaspoon -- Pharyngeal -- Pharyngeal- Thin Cup Pharyngeal residue - valleculae;Delayed swallow initiation-vallecula;Penetration/Aspiration during swallow;Penetration/Apiration after swallow Pharyngeal Material enters airway, CONTACTS cords and not ejected out;Material enters airway, passes BELOW cords and not ejected out despite cough attempt by patient Pharyngeal- Thin Straw Pharyngeal residue - valleculae;Delayed swallow initiation-vallecula;Penetration/Aspiration during swallow;Penetration/Apiration after swallow Pharyngeal Material enters airway, CONTACTS cords and not ejected out;Material enters airway, passes BELOW cords and not ejected out despite cough attempt by patient Pharyngeal- Puree Pharyngeal residue - valleculae;Delayed swallow initiation-vallecula Pharyngeal -- Pharyngeal- Mechanical Soft -- Pharyngeal -- Pharyngeal- Regular Pharyngeal residue - valleculae;Delayed swallow initiation-vallecula Pharyngeal -- Pharyngeal- Multi-consistency -- Pharyngeal -- Pharyngeal- Pill Pharyngeal residue - valleculae;Delayed swallow initiation-vallecula  Pharyngeal -- Pharyngeal Comment --  CHL IP CERVICAL ESOPHAGEAL PHASE 04/12/2021 Cervical Esophageal Phase WFL Pudding Teaspoon -- Pudding Cup -- Honey Teaspoon -- Honey Cup -- Nectar Teaspoon -- Nectar Cup -- Nectar Straw -- Thin Teaspoon -- Thin Cup -- Thin Straw -- Puree -- Mechanical Soft -- Regular -- Multi-consistency -- Pill -- Cervical Esophageal Comment -- Shanika I. Vear Clock, MS, CCC-SLP Acute Rehabilitation Services Office number [phone] Pager [phone] Scheryl Marten 04/12/2021, 2:53 PM  DG Hip Unilat W or Wo Pelvis 2-3 Views Left  Result Date: 04/11/2021 CLINICAL DATA:  Left hip pain following a fall last night. EXAM: DG HIP (WITH OR WITHOUT PELVIS) 2-3V LEFT COMPARISON:  Pelvis and right hip dated 05/09/2019. Abdomen and pelvis CT dated 10/04/2020. FINDINGS: Stable old, possible acute left inferior pubic ramus fracture at the location of a previously demonstrated old, healed fracture. There is also an acute, mildly comminuted and mildly displaced fracture of the left superior pubic ramus. No hip fracture or dislocation seen. Stable right hip prosthesis. Diffuse osteopenia. IMPRESSION: 1. Left superior and inferior pubic ramus fractures. 2. No hip fracture or dislocation. Electronically Signed   By: Beckie SaltsSteven  Reid M.D.   On: 04/11/2021 18:48      Subjective: No concerns this morning.  Discharge Exam: Vitals:   04/16/21 0626 04/16/21 0733  BP: (!) 155/68 (!) 150/66  Pulse: 88 86  Resp: 16 17  Temp: 98 F (36.7 C) 98.2 F (36.8 C)  SpO2: 96% 95%   Vitals:   04/15/21 1527 04/15/21 2012 04/16/21 0626 04/16/21 0733  BP: (!) 120/59 (!) 133/58 (!) 155/68 (!) 150/66  Pulse: 96 97 88 86  Resp: 16 16 16 17   Temp: 98.2 F (36.8 C) 97.7 F (36.5 C) 98 F (36.7 C) 98.2 F (36.8 C)  TempSrc: Oral Oral Oral Oral  SpO2: 96% 96% 96% 95%  Weight:      Height:        General: Pt is alert, awake, not in acute distress Cardiovascular: RRR, S1/S2 +, no rubs, no  gallops Respiratory: CTA bilaterally, no wheezing, no rhonchi Abdominal: Soft, NT, ND, bowel sounds + Extremities: no edema, no cyanosis Skin: large ecchymotic area of right neck and right upper chest    The results of significant diagnostics from this hospitalization (including imaging, microbiology, ancillary and laboratory) are listed below for reference.     Microbiology: Recent Results (from the past 240 hour(s))  SARS CORONAVIRUS 2 (TAT 6-24 HRS) Nasopharyngeal Nasopharyngeal Swab     Status: None   Collection Time: 04/11/21  8:27 PM   Specimen: Nasopharyngeal Swab  Result Value Ref Range Status   SARS Coronavirus 2 NEGATIVE NEGATIVE Final    Comment: (NOTE) SARS-CoV-2 target nucleic acids are NOT DETECTED.  The SARS-CoV-2 RNA is generally detectable in upper and lower respiratory specimens during the acute phase of infection. Negative results do not preclude SARS-CoV-2 infection, do not rule out co-infections with other pathogens, and should not be used as the sole basis for treatment or other patient management decisions. Negative results must be combined with clinical observations, patient history, and epidemiological information. The expected result is Negative.  Fact Sheet for Patients: HairSlick.nohttps://www.fda.gov/media/138098/download  Fact Sheet for Healthcare Providers: quierodirigir.comhttps://www.fda.gov/media/138095/download  This test is not yet approved or cleared by the [place] FDA and  has been authorized for detection and/or diagnosis of SARS-CoV-2 by FDA under an Emergency Use Authorization (EUA). This EUA will remain  in effect (meaning this test can be used) for the duration of the COVID-19 declaration under Se ction 564(b)(1) of the Act, 21 U.S.C. section 360bbb-3(b)(1), unless the authorization is terminated or revoked sooner.  Performed at [facility], [address] [place], [place] [address]   Culture, Urine     Status: Abnormal   Collection Time:  04/13/21 10:00 PM   Specimen: Urine, Random  Result Value Ref Range Status   Specimen Description URINE, RANDOM  Final   Special Requests  Final    NONE Performed at [facility], [address] [place], [place] [address]    Culture MULTIPLE SPECIES PRESENT, SUGGEST RECOLLECTION (A)  Final   Report Status 04/15/2021 FINAL  Final  SARS CORONAVIRUS 2 (TAT 6-24 HRS) Nasopharyngeal Nasopharyngeal Swab     Status: None   Collection Time: 04/15/21  4:03 PM   Specimen: Nasopharyngeal Swab  Result Value Ref Range Status   SARS Coronavirus 2 NEGATIVE NEGATIVE Final    Comment: (NOTE) SARS-CoV-2 target nucleic acids are NOT DETECTED.  The SARS-CoV-2 RNA is generally detectable in upper and lower respiratory specimens during the acute phase of infection. Negative results do not preclude SARS-CoV-2 infection, do not rule out co-infections with other pathogens, and should not be used as the sole basis for treatment or other patient management decisions. Negative results must be combined with clinical observations, patient history, and epidemiological information. The expected result is Negative.  Fact Sheet for Patients: HairSlick.no  Fact Sheet for Healthcare Providers: quierodirigir.com  This test is not yet approved or cleared by the [place] FDA and  has been authorized for detection and/or diagnosis of SARS-CoV-2 by FDA under an Emergency Use Authorization (EUA). This EUA will remain  in effect (meaning this test can be used) for the duration of the COVID-19 declaration under Se ction 564(b)(1) of the Act, 21 U.S.C. section 360bbb-3(b)(1), unless the authorization is terminated or revoked sooner.  Performed at [facility], [address], [place], [place] [address]      Labs: BNP (last 3 results) No results for input(s): BNP in the last 8760 hours. Basic Metabolic Panel: Recent Labs  Lab 04/11/21 1753  04/12/21 0330  NA 131* 133*  K 4.3 4.1  CL 97* 100  CO2 27 24  GLUCOSE 179* 163*  BUN 12 15  CREATININE 0.92 0.82  CALCIUM 10.0 9.7   Liver Function Tests: Recent Labs  Lab 04/11/21 1753  AST 55*  ALT 29  ALKPHOS 116  BILITOT 4.2*  PROT 7.0  ALBUMIN 3.6   No results for input(s): LIPASE, AMYLASE in the last 168 hours. No results for input(s): AMMONIA in the last 168 hours. CBC: Recent Labs  Lab 04/11/21 1753 04/12/21 0330  WBC 13.5* 14.6*  NEUTROABS 11.0*  --   HGB 13.9 12.8  HCT 42.0 38.4  MCV 92.1 89.7  PLT 97* 85*   Cardiac Enzymes: Recent Labs  Lab 04/11/21 1753 04/12/21 0330  CKTOTAL 590* 375*   BNP: Invalid input(s): POCBNP CBG: Recent Labs  Lab 04/15/21 0614 04/15/21 1127 04/15/21 1608 04/15/21 2014 04/16/21 0625  GLUCAP 127* 247* 183* 155* 121*   D-Dimer No results for input(s): DDIMER in the last 72 hours. Hgb A1c No results for input(s): HGBA1C in the last 72 hours. Lipid Profile No results for input(s): CHOL, HDL, LDLCALC, TRIG, CHOLHDL, LDLDIRECT in the last 72 hours. Thyroid function studies No results for input(s): TSH, T4TOTAL, T3FREE, THYROIDAB in the last 72 hours.  Invalid input(s): FREET3 Anemia work up Recent Labs    04/13/21 1424  VITAMINB12 832  FOLATE 13.3   Urinalysis    Component Value Date/Time   COLORURINE YELLOW 04/13/2021 2241   APPEARANCEUR HAZY (A) 04/13/2021 2241   LABSPEC 1.008 04/13/2021 2241   PHURINE 7.0 04/13/2021 2241   GLUCOSEU >=500 (A) 04/13/2021 2241   HGBUR SMALL (A) 04/13/2021 2241   BILIRUBINUR NEGATIVE 04/13/2021 2241   KETONESUR NEGATIVE 04/13/2021 2241   PROTEINUR NEGATIVE 04/13/2021 2241  UROBILINOGEN 0.2 02/17/2013 1933   NITRITE NEGATIVE 04/13/2021 2241   LEUKOCYTESUR LARGE (A) 04/13/2021 2241   Sepsis Labs Invalid input(s): PROCALCITONIN,  WBC,  LACTICIDVEN Microbiology Recent Results (from the past 240 hour(s))  SARS CORONAVIRUS 2 (TAT 6-24 HRS) Nasopharyngeal  Nasopharyngeal Swab     Status: None   Collection Time: 04/11/21  8:27 PM   Specimen: Nasopharyngeal Swab  Result Value Ref Range Status   SARS Coronavirus 2 NEGATIVE NEGATIVE Final    Comment: (NOTE) SARS-CoV-2 target nucleic acids are NOT DETECTED.  The SARS-CoV-2 RNA is generally detectable in upper and lower respiratory specimens during the acute phase of infection. Negative results do not preclude SARS-CoV-2 infection, do not rule out co-infections with other pathogens, and should not be used as the sole basis for treatment or other patient management decisions. Negative results must be combined with clinical observations, patient history, and epidemiological information. The expected result is Negative.  Fact Sheet for Patients: HairSlick.no  Fact Sheet for Healthcare Providers: quierodirigir.com  This test is not yet approved or cleared by the [place] FDA and  has been authorized for detection and/or diagnosis of SARS-CoV-2 by FDA under an Emergency Use Authorization (EUA). This EUA will remain  in effect (meaning this test can be used) for the duration of the COVID-19 declaration under Se ction 564(b)(1) of the Act, 21 U.S.C. section 360bbb-3(b)(1), unless the authorization is terminated or revoked sooner.  Performed at [facility], [address] [place], [place] [address]   Culture, Urine     Status: Abnormal   Collection Time: 04/13/21 10:00 PM   Specimen: Urine, Random  Result Value Ref Range Status   Specimen Description URINE, RANDOM  Final   Special Requests   Final    NONE Performed at [facility], [address] [place], [place] [address]    Culture MULTIPLE SPECIES PRESENT, SUGGEST RECOLLECTION (A)  Final   Report Status 04/15/2021 FINAL  Final  SARS CORONAVIRUS 2 (TAT 6-24 HRS) Nasopharyngeal Nasopharyngeal Swab     Status: None   Collection Time: 04/15/21  4:03 PM   Specimen:  Nasopharyngeal Swab  Result Value Ref Range Status   SARS Coronavirus 2 NEGATIVE NEGATIVE Final    Comment: (NOTE) SARS-CoV-2 target nucleic acids are NOT DETECTED.  The SARS-CoV-2 RNA is generally detectable in upper and lower respiratory specimens during the acute phase of infection. Negative results do not preclude SARS-CoV-2 infection, do not rule out co-infections with other pathogens, and should not be used as the sole basis for treatment or other patient management decisions. Negative results must be combined with clinical observations, patient history, and epidemiological information. The expected result is Negative.  Fact Sheet for Patients: HairSlick.no  Fact Sheet for Healthcare Providers: quierodirigir.com  This test is not yet approved or cleared by the [place] FDA and  has been authorized for detection and/or diagnosis of SARS-CoV-2 by FDA under an Emergency Use Authorization (EUA). This EUA will remain  in effect (meaning this test can be used) for the duration of the COVID-19 declaration under Se ction 564(b)(1) of the Act, 21 U.S.C. section 360bbb-3(b)(1), unless the authorization is terminated or revoked sooner.  Performed at [facility], [address] [place], [place] [address]      Time coordinating discharge: 35 minutes  SIGNED:   Jacquelin Hawking, MD [facility] 04/16/2021, 8:58 AM

## 2021-04-16 NOTE — Discharge Instructions (Signed)
Lauren Avery,  You were in the hospital with a pelvis fracture. This was managed conservatively with pain management and therapy.

## 2021-04-16 NOTE — TOC Transition Note (Signed)
Transition of Care [facility]) - CM/SW Discharge Note   Patient Details  Name: Lauren Avery MRN: [id number] Date of Birth: [birth year]  [facility] CM/SW Contact:  Ralene Bathe, LCSWA Phone Number: 04/16/2021, 10:46 AM   Clinical Narrative:    Patient will DC to: SNF/ [facility] Anticipated DC date: 04/16/2021 Family notified: Yes Transport by:  Sharin Mons   Per MD patient ready for DC to . RN to call report to [phone]  . RN, patient, patient's family, and facility notified of DC. Discharge Summary and FL2 sent to facility. DC packet on chart. Ambulance transport requested for patient.   Patient is currently awaiting the COVID booster and after receiving will be ready for d/c.    CSW will sign off for now as social work intervention is no longer needed. Please consult [place] again if new needs arise.     Final next level of care: Skilled Nursing Facility Barriers to Discharge: Barriers Resolved   Patient Goals and CMS Choice Patient states their goals for this hospitalization and ongoing recovery are:: walking, "just go" CMS Medicare.gov Compare Post Acute Care list provided to:: Patient Represenative (must comment) Choice offered to / list presented to : Adult Children  Discharge Placement PASRR number recieved: 04/15/21            Patient chooses bed at: [facility] Patient to be transferred to facility by: [facility] Name of family member notified: Clydie Braun, adult daughter Patient and family notified of of transfer: 04/16/21  Discharge Plan and Services In-house Referral: Clinical Social Work   Post Acute Care Choice: Skilled Nursing Facility                               Social Determinants of Health (SDOH) Interventions     Readmission Risk Interventions No flowsheet data found.

## 2021-04-17 NOTE — Progress Notes (Signed)
[facility] hear at 0100 to pick patient up, IV taken out, all night meds given including PRN tylenol for pain, family member at bedside upon d/c, no questions or concern, patient has all her belongings , will continue to monitor

## 2021-04-17 NOTE — Plan of Care (Signed)

## 2021-05-08 DIAGNOSIS — S32512D Fracture of superior rim of left pubis, subsequent encounter for fracture with routine healing: Secondary | ICD-10-CM | POA: Diagnosis not present

## 2021-05-08 DIAGNOSIS — S32000S Wedge compression fracture of unspecified lumbar vertebra, sequela: Secondary | ICD-10-CM | POA: Diagnosis not present

## 2021-05-08 DIAGNOSIS — M800AXD Age-related osteoporosis with current pathological fracture, other site, subsequent encounter for fracture with routine healing: Secondary | ICD-10-CM | POA: Diagnosis not present

## 2021-05-08 DIAGNOSIS — E119 Type 2 diabetes mellitus without complications: Secondary | ICD-10-CM | POA: Diagnosis not present

## 2021-05-14 DIAGNOSIS — M47812 Spondylosis without myelopathy or radiculopathy, cervical region: Secondary | ICD-10-CM | POA: Diagnosis not present

## 2021-05-14 DIAGNOSIS — M2578 Osteophyte, vertebrae: Secondary | ICD-10-CM | POA: Diagnosis not present

## 2021-05-14 DIAGNOSIS — Q2546 Tortuous aortic arch: Secondary | ICD-10-CM | POA: Diagnosis not present

## 2021-05-14 DIAGNOSIS — R1313 Dysphagia, pharyngeal phase: Secondary | ICD-10-CM | POA: Diagnosis not present

## 2021-05-14 DIAGNOSIS — M4802 Spinal stenosis, cervical region: Secondary | ICD-10-CM | POA: Diagnosis not present

## 2021-05-14 DIAGNOSIS — M8580 Other specified disorders of bone density and structure, unspecified site: Secondary | ICD-10-CM | POA: Diagnosis not present

## 2021-05-14 DIAGNOSIS — M5033 Other cervical disc degeneration, cervicothoracic region: Secondary | ICD-10-CM | POA: Diagnosis not present

## 2021-05-14 DIAGNOSIS — J439 Emphysema, unspecified: Secondary | ICD-10-CM | POA: Diagnosis not present

## 2021-05-14 DIAGNOSIS — M5021 Other cervical disc displacement,  high cervical region: Secondary | ICD-10-CM | POA: Diagnosis not present

## 2021-05-14 DIAGNOSIS — I119 Hypertensive heart disease without heart failure: Secondary | ICD-10-CM | POA: Diagnosis not present

## 2021-05-14 DIAGNOSIS — F32A Depression, unspecified: Secondary | ICD-10-CM | POA: Diagnosis not present

## 2021-05-14 DIAGNOSIS — K219 Gastro-esophageal reflux disease without esophagitis: Secondary | ICD-10-CM | POA: Diagnosis not present

## 2021-05-14 DIAGNOSIS — M5031 Other cervical disc degeneration,  high cervical region: Secondary | ICD-10-CM | POA: Diagnosis not present

## 2021-05-14 DIAGNOSIS — M80052D Age-related osteoporosis with current pathological fracture, left femur, subsequent encounter for fracture with routine healing: Secondary | ICD-10-CM | POA: Diagnosis not present

## 2021-05-14 DIAGNOSIS — E119 Type 2 diabetes mellitus without complications: Secondary | ICD-10-CM | POA: Diagnosis not present

## 2021-05-14 DIAGNOSIS — J84112 Idiopathic pulmonary fibrosis: Secondary | ICD-10-CM | POA: Diagnosis not present

## 2021-05-15 ENCOUNTER — Other Ambulatory Visit: Payer: Self-pay

## 2021-05-15 ENCOUNTER — Ambulatory Visit (INDEPENDENT_AMBULATORY_CARE_PROVIDER_SITE_OTHER): Payer: Medicare Other | Admitting: Podiatry

## 2021-05-15 ENCOUNTER — Encounter: Payer: Self-pay | Admitting: Podiatry

## 2021-05-15 DIAGNOSIS — E1142 Type 2 diabetes mellitus with diabetic polyneuropathy: Secondary | ICD-10-CM

## 2021-05-15 DIAGNOSIS — B351 Tinea unguium: Secondary | ICD-10-CM

## 2021-05-15 DIAGNOSIS — D689 Coagulation defect, unspecified: Secondary | ICD-10-CM | POA: Diagnosis not present

## 2021-05-15 DIAGNOSIS — M79676 Pain in unspecified toe(s): Secondary | ICD-10-CM | POA: Diagnosis not present

## 2021-05-15 NOTE — Progress Notes (Signed)
This patient returns to my office for at risk foot care.  This patient requires this care by a professional since this patient will be at risk due to having diabetes and long term use of anticoagulants.    This patient is unable to cut nails herself since the patient cannot reach her nails.These nails are painful walking and wearing shoes.  This patient presents for at risk foot care today.  General Appearance  Alert, conversant and in no acute stress.  Vascular  Dorsalis pedis and posterior tibial  pulses are weakly  palpable  bilaterally.  Capillary return is within normal limits  Bilaterally.Cold feet  Bilaterally. Absent digital hair.  Neurologic  Senn-Weinstein monofilament wire test diminished   bilaterally. Muscle power within normal limits bilaterally.  Nails Thick disfigured discolored nails with subungual debris  from hallux to fifth toes bilaterally.  Friable hallux toenails  B/L.   No evidence of bacterial infection or drainage bilaterally.  Orthopedic  No limitations of motion  feet .  No crepitus or effusions noted.  No bony pathology or digital deformities noted.  HAV  B/L.  Skin  normotropic skin with no porokeratosis noted bilaterally.  No signs of infections or ulcers noted.     Onychomycosis  Pain in right toes  Pain in left toes  Consent was obtained for treatment procedures.   Mechanical debridement of nails 1-5  bilaterally performed with a nail nipper.  Filed with dremel without incident. No infection or ulcer.     Return office visit   10 weeks                   Told patient to return for periodic foot care and evaluation due to potential at risk complications.   Helane Gunther DPM

## 2021-05-24 DIAGNOSIS — F330 Major depressive disorder, recurrent, mild: Secondary | ICD-10-CM | POA: Diagnosis not present

## 2021-05-30 DIAGNOSIS — F330 Major depressive disorder, recurrent, mild: Secondary | ICD-10-CM | POA: Diagnosis not present

## 2021-06-04 DIAGNOSIS — F329 Major depressive disorder, single episode, unspecified: Secondary | ICD-10-CM | POA: Diagnosis not present

## 2021-06-04 DIAGNOSIS — M810 Age-related osteoporosis without current pathological fracture: Secondary | ICD-10-CM | POA: Diagnosis not present

## 2021-06-05 DIAGNOSIS — R1312 Dysphagia, oropharyngeal phase: Secondary | ICD-10-CM | POA: Diagnosis not present

## 2021-06-05 DIAGNOSIS — J84112 Idiopathic pulmonary fibrosis: Secondary | ICD-10-CM | POA: Diagnosis not present

## 2021-06-05 DIAGNOSIS — J439 Emphysema, unspecified: Secondary | ICD-10-CM | POA: Diagnosis not present

## 2021-06-05 DIAGNOSIS — I10 Essential (primary) hypertension: Secondary | ICD-10-CM | POA: Diagnosis not present

## 2021-06-06 DIAGNOSIS — R0989 Other specified symptoms and signs involving the circulatory and respiratory systems: Secondary | ICD-10-CM | POA: Diagnosis not present

## 2021-06-12 ENCOUNTER — Emergency Department (HOSPITAL_COMMUNITY): Payer: Medicare Other

## 2021-06-12 ENCOUNTER — Inpatient Hospital Stay (HOSPITAL_COMMUNITY)
Admission: EM | Admit: 2021-06-12 | Discharge: 2021-06-17 | DRG: 480 | Disposition: A | Discharge status: Skilled Nursing Facility | Payer: Medicare Other | Attending: Family Medicine | Admitting: Family Medicine

## 2021-06-12 ENCOUNTER — Other Ambulatory Visit: Payer: Self-pay

## 2021-06-12 ENCOUNTER — Encounter (HOSPITAL_COMMUNITY): Payer: Self-pay | Admitting: Emergency Medicine

## 2021-06-12 DIAGNOSIS — J440 Chronic obstructive pulmonary disease with acute lower respiratory infection: Secondary | ICD-10-CM | POA: Diagnosis present

## 2021-06-12 DIAGNOSIS — R2689 Other abnormalities of gait and mobility: Secondary | ICD-10-CM | POA: Diagnosis not present

## 2021-06-12 DIAGNOSIS — Z20822 Contact with and (suspected) exposure to covid-19: Secondary | ICD-10-CM | POA: Diagnosis present

## 2021-06-12 DIAGNOSIS — R531 Weakness: Secondary | ICD-10-CM | POA: Diagnosis not present

## 2021-06-12 DIAGNOSIS — E119 Type 2 diabetes mellitus without complications: Secondary | ICD-10-CM | POA: Diagnosis not present

## 2021-06-12 DIAGNOSIS — E1122 Type 2 diabetes mellitus with diabetic chronic kidney disease: Secondary | ICD-10-CM

## 2021-06-12 DIAGNOSIS — S72002A Fracture of unspecified part of neck of left femur, initial encounter for closed fracture: Secondary | ICD-10-CM | POA: Diagnosis present

## 2021-06-12 DIAGNOSIS — Z7901 Long term (current) use of anticoagulants: Secondary | ICD-10-CM | POA: Diagnosis not present

## 2021-06-12 DIAGNOSIS — I959 Hypotension, unspecified: Secondary | ICD-10-CM | POA: Diagnosis not present

## 2021-06-12 DIAGNOSIS — S72142A Displaced intertrochanteric fracture of left femur, initial encounter for closed fracture: Secondary | ICD-10-CM | POA: Diagnosis not present

## 2021-06-12 DIAGNOSIS — Z66 Do not resuscitate: Secondary | ICD-10-CM | POA: Diagnosis present

## 2021-06-12 DIAGNOSIS — Z79899 Other long term (current) drug therapy: Secondary | ICD-10-CM

## 2021-06-12 DIAGNOSIS — R0689 Other abnormalities of breathing: Secondary | ICD-10-CM

## 2021-06-12 DIAGNOSIS — I10 Essential (primary) hypertension: Secondary | ICD-10-CM | POA: Diagnosis present

## 2021-06-12 DIAGNOSIS — R0902 Hypoxemia: Secondary | ICD-10-CM | POA: Diagnosis not present

## 2021-06-12 DIAGNOSIS — Z888 Allergy status to other drugs, medicaments and biological substances status: Secondary | ICD-10-CM

## 2021-06-12 DIAGNOSIS — I129 Hypertensive chronic kidney disease with stage 1 through stage 4 chronic kidney disease, or unspecified chronic kidney disease: Secondary | ICD-10-CM

## 2021-06-12 DIAGNOSIS — S72009A Fracture of unspecified part of neck of unspecified femur, initial encounter for closed fracture: Secondary | ICD-10-CM

## 2021-06-12 DIAGNOSIS — J84112 Idiopathic pulmonary fibrosis: Secondary | ICD-10-CM | POA: Diagnosis present

## 2021-06-12 DIAGNOSIS — M810 Age-related osteoporosis without current pathological fracture: Secondary | ICD-10-CM | POA: Diagnosis present

## 2021-06-12 DIAGNOSIS — Z96643 Presence of artificial hip joint, bilateral: Secondary | ICD-10-CM | POA: Diagnosis present

## 2021-06-12 DIAGNOSIS — E1165 Type 2 diabetes mellitus with hyperglycemia: Secondary | ICD-10-CM | POA: Diagnosis present

## 2021-06-12 DIAGNOSIS — J690 Pneumonitis due to inhalation of food and vomit: Secondary | ICD-10-CM | POA: Diagnosis not present

## 2021-06-12 DIAGNOSIS — F32A Depression, unspecified: Secondary | ICD-10-CM | POA: Diagnosis present

## 2021-06-12 DIAGNOSIS — M25552 Pain in left hip: Secondary | ICD-10-CM | POA: Diagnosis not present

## 2021-06-12 DIAGNOSIS — D62 Acute posthemorrhagic anemia: Secondary | ICD-10-CM | POA: Diagnosis present

## 2021-06-12 DIAGNOSIS — N179 Acute kidney failure, unspecified: Secondary | ICD-10-CM | POA: Diagnosis not present

## 2021-06-12 DIAGNOSIS — R0682 Tachypnea, not elsewhere classified: Secondary | ICD-10-CM | POA: Diagnosis not present

## 2021-06-12 DIAGNOSIS — W050XXA Fall from non-moving wheelchair, initial encounter: Secondary | ICD-10-CM | POA: Diagnosis present

## 2021-06-12 DIAGNOSIS — S2239XA Fracture of one rib, unspecified side, initial encounter for closed fracture: Secondary | ICD-10-CM | POA: Diagnosis not present

## 2021-06-12 DIAGNOSIS — R52 Pain, unspecified: Secondary | ICD-10-CM | POA: Diagnosis not present

## 2021-06-12 DIAGNOSIS — S72142D Displaced intertrochanteric fracture of left femur, subsequent encounter for closed fracture with routine healing: Secondary | ICD-10-CM | POA: Diagnosis not present

## 2021-06-12 DIAGNOSIS — R1312 Dysphagia, oropharyngeal phase: Secondary | ICD-10-CM | POA: Diagnosis not present

## 2021-06-12 DIAGNOSIS — R739 Hyperglycemia, unspecified: Secondary | ICD-10-CM | POA: Diagnosis not present

## 2021-06-12 DIAGNOSIS — R000 Tachycardia, unspecified: Secondary | ICD-10-CM | POA: Diagnosis not present

## 2021-06-12 DIAGNOSIS — J189 Pneumonia, unspecified organism: Secondary | ICD-10-CM | POA: Diagnosis not present

## 2021-06-12 DIAGNOSIS — E860 Dehydration: Secondary | ICD-10-CM | POA: Diagnosis not present

## 2021-06-12 DIAGNOSIS — Z833 Family history of diabetes mellitus: Secondary | ICD-10-CM

## 2021-06-12 DIAGNOSIS — M6281 Muscle weakness (generalized): Secondary | ICD-10-CM | POA: Diagnosis not present

## 2021-06-12 DIAGNOSIS — Z794 Long term (current) use of insulin: Secondary | ICD-10-CM

## 2021-06-12 DIAGNOSIS — K219 Gastro-esophageal reflux disease without esophagitis: Secondary | ICD-10-CM | POA: Diagnosis present

## 2021-06-12 DIAGNOSIS — W07XXXA Fall from chair, initial encounter: Secondary | ICD-10-CM

## 2021-06-12 DIAGNOSIS — Z96642 Presence of left artificial hip joint: Secondary | ICD-10-CM | POA: Diagnosis not present

## 2021-06-12 DIAGNOSIS — R41841 Cognitive communication deficit: Secondary | ICD-10-CM | POA: Diagnosis not present

## 2021-06-12 DIAGNOSIS — R0600 Dyspnea, unspecified: Secondary | ICD-10-CM

## 2021-06-12 DIAGNOSIS — J849 Interstitial pulmonary disease, unspecified: Secondary | ICD-10-CM | POA: Diagnosis not present

## 2021-06-12 DIAGNOSIS — Z9181 History of falling: Secondary | ICD-10-CM | POA: Diagnosis not present

## 2021-06-12 DIAGNOSIS — J449 Chronic obstructive pulmonary disease, unspecified: Secondary | ICD-10-CM | POA: Diagnosis not present

## 2021-06-12 LAB — CBC
HCT: 45.3 % (ref 36.0–46.0)
Hemoglobin: 14.6 g/dL (ref 12.0–15.0)
MCH: 31.3 pg (ref 26.0–34.0)
MCHC: 32.2 g/dL (ref 30.0–36.0)
MCV: 97 fL (ref 80.0–100.0)
Platelets: 108 10*3/uL — ABNORMAL LOW (ref 150–400)
RBC: 4.67 MIL/uL (ref 3.87–5.11)
RDW: 15.8 % — ABNORMAL HIGH (ref 11.5–15.5)
WBC: 7.6 10*3/uL (ref 4.0–10.5)
nRBC: 0 % (ref 0.0–0.2)

## 2021-06-12 LAB — BASIC METABOLIC PANEL
Anion gap: 9 (ref 5–15)
BUN: 16 mg/dL (ref 8–23)
CO2: 23 mmol/L (ref 22–32)
Calcium: 9.3 mg/dL (ref 8.9–10.3)
Chloride: 103 mmol/L (ref 98–111)
Creatinine, Ser: 0.92 mg/dL (ref 0.44–1.00)
GFR, Estimated: 58 mL/min — ABNORMAL LOW (ref >60)
Glucose, Bld: 276 mg/dL — ABNORMAL HIGH (ref 70–99)
Potassium: 4.2 mmol/L (ref 3.5–5.1)
Sodium: 135 mmol/L (ref 135–145)

## 2021-06-12 LAB — SURGICAL PCR SCREEN
MRSA, PCR: NEGATIVE
Staphylococcus aureus: NEGATIVE

## 2021-06-12 LAB — TYPE AND SCREEN
ABO/RH(D): O POS
Antibody Screen: NEGATIVE

## 2021-06-12 LAB — GLUCOSE, CAPILLARY: Glucose-Capillary: 238 mg/dL — ABNORMAL HIGH (ref 70–99)

## 2021-06-12 LAB — CBG MONITORING, ED: Glucose-Capillary: 215 mg/dL — ABNORMAL HIGH (ref 70–99)

## 2021-06-12 LAB — VITAMIN D 25 HYDROXY (VIT D DEFICIENCY, FRACTURES): Vit D, 25-Hydroxy: 42.29 ng/mL (ref 30–100)

## 2021-06-12 MED ORDER — SERTRALINE HCL 50 MG PO TABS
25.0000 mg | ORAL_TABLET | Freq: Every day | ORAL | Status: DC
  Administered 2021-06-12 – 2021-06-16 (×4): 25 mg via ORAL
  Filled 2021-06-12 (×4): qty 1

## 2021-06-12 MED ORDER — HYDROMORPHONE HCL 1 MG/ML IJ SOLN
0.5000 mg | Freq: Once | INTRAMUSCULAR | Status: AC
  Administered 2021-06-12: 0.5 mg via INTRAVENOUS
  Filled 2021-06-12: qty 1

## 2021-06-12 MED ORDER — CEFAZOLIN SODIUM-DEXTROSE 2-4 GM/100ML-% IV SOLN: 2.0000 g | INTRAVENOUS | Status: DC

## 2021-06-12 MED ORDER — INSULIN DETEMIR 100 UNIT/ML ~~LOC~~ SOLN
5.0000 [IU] | Freq: Every day | SUBCUTANEOUS | Status: DC
  Administered 2021-06-12: 5 [IU] via SUBCUTANEOUS
  Filled 2021-06-12 (×2): qty 0.05

## 2021-06-12 MED ORDER — METHOCARBAMOL 1000 MG/10ML IJ SOLN
500.0000 mg | Freq: Four times a day (QID) | INTRAVENOUS | Status: DC | PRN
  Filled 2021-06-12: qty 5

## 2021-06-12 MED ORDER — HYDROMORPHONE HCL 1 MG/ML IJ SOLN
0.5000 mg | INTRAMUSCULAR | Status: DC | PRN
  Administered 2021-06-13 – 2021-06-14 (×3): 1 mg via INTRAVENOUS
  Administered 2021-06-14: 0.5 mg via INTRAVENOUS
  Administered 2021-06-15: 1 mg via INTRAVENOUS
  Filled 2021-06-12 (×2): qty 1
  Filled 2021-06-12: qty 0.5
  Filled 2021-06-12 (×3): qty 1

## 2021-06-12 MED ORDER — ACETAMINOPHEN 325 MG PO TABS: 650.0000 mg | ORAL_TABLET | Freq: Four times a day (QID) | ORAL | Status: DC | PRN

## 2021-06-12 MED ORDER — PANTOPRAZOLE SODIUM 40 MG PO TBEC
40.0000 mg | DELAYED_RELEASE_TABLET | Freq: Every day | ORAL | Status: DC
  Administered 2021-06-13 – 2021-06-17 (×3): 40 mg via ORAL
  Filled 2021-06-12 (×4): qty 1

## 2021-06-12 MED ORDER — PANTOPRAZOLE SODIUM 40 MG PO TBEC: 40.0000 mg | DELAYED_RELEASE_TABLET | Freq: Two times a day (BID) | ORAL | Status: DC

## 2021-06-12 MED ORDER — SODIUM CHLORIDE 0.9 % IV SOLN: INTRAVENOUS | Status: AC

## 2021-06-12 MED ORDER — DEXTROSE 50 % IV SOLN
25.0000 g | Freq: Once | INTRAVENOUS | Status: DC
  Filled 2021-06-12: qty 50

## 2021-06-12 MED ORDER — MORPHINE SULFATE (PF) 4 MG/ML IV SOLN
4.0000 mg | Freq: Once | INTRAVENOUS | Status: AC
  Administered 2021-06-12: 4 mg via INTRAVENOUS
  Filled 2021-06-12: qty 1

## 2021-06-12 MED ORDER — CHLORHEXIDINE GLUCONATE 4 % EX LIQD
60.0000 mL | Freq: Once | CUTANEOUS | Status: AC
  Administered 2021-06-13: 4 via TOPICAL
  Filled 2021-06-12: qty 60

## 2021-06-12 MED ORDER — METHOCARBAMOL 500 MG PO TABS
500.0000 mg | ORAL_TABLET | Freq: Four times a day (QID) | ORAL | Status: DC | PRN
  Administered 2021-06-16: 500 mg via ORAL
  Filled 2021-06-12: qty 1

## 2021-06-12 MED ORDER — FLUTICASONE FUROATE-VILANTEROL 200-25 MCG/INH IN AEPB
1.0000 | INHALATION_SPRAY | Freq: Every day | RESPIRATORY_TRACT | Status: DC
  Administered 2021-06-13 – 2021-06-17 (×2): 1 via RESPIRATORY_TRACT
  Filled 2021-06-12: qty 28

## 2021-06-12 MED ORDER — FLUTICASONE FUROATE-VILANTEROL 200-25 MCG/INH IN AEPB: 1.0000 | INHALATION_SPRAY | Freq: Every day | RESPIRATORY_TRACT | Status: DC

## 2021-06-12 MED ORDER — ONDANSETRON HCL 4 MG/2ML IJ SOLN
4.0000 mg | Freq: Once | INTRAMUSCULAR | Status: AC
  Administered 2021-06-12: 4 mg via INTRAVENOUS
  Filled 2021-06-12: qty 2

## 2021-06-12 MED ORDER — POVIDONE-IODINE 10 % EX SWAB: 2.0000 "application " | Freq: Once | CUTANEOUS | Status: DC

## 2021-06-12 MED ORDER — LOSARTAN POTASSIUM 50 MG PO TABS
25.0000 mg | ORAL_TABLET | Freq: Every day | ORAL | Status: DC
  Administered 2021-06-12 – 2021-06-13 (×2): 25 mg via ORAL
  Filled 2021-06-12 (×3): qty 1

## 2021-06-12 MED ORDER — HYDROCODONE-ACETAMINOPHEN 5-325 MG PO TABS: 1.0000 | ORAL_TABLET | Freq: Four times a day (QID) | ORAL | Status: DC | PRN

## 2021-06-12 MED ORDER — ONDANSETRON HCL 4 MG/2ML IJ SOLN: 4.0000 mg | Freq: Four times a day (QID) | INTRAMUSCULAR | Status: DC | PRN

## 2021-06-12 MED ORDER — INSULIN ASPART 100 UNIT/ML IJ SOLN
0.0000 [IU] | Freq: Three times a day (TID) | INTRAMUSCULAR | Status: DC
  Administered 2021-06-13: 3 [IU] via SUBCUTANEOUS
  Administered 2021-06-13: 1 [IU] via SUBCUTANEOUS
  Administered 2021-06-13 – 2021-06-15 (×3): 3 [IU] via SUBCUTANEOUS
  Administered 2021-06-15 (×2): 2 [IU] via SUBCUTANEOUS
  Administered 2021-06-16 (×2): 3 [IU] via SUBCUTANEOUS
  Administered 2021-06-16 – 2021-06-17 (×2): 7 [IU] via SUBCUTANEOUS
  Administered 2021-06-17: 3 [IU] via SUBCUTANEOUS

## 2021-06-12 MED ORDER — MUPIROCIN 2 % EX OINT
1.0000 "application " | TOPICAL_OINTMENT | Freq: Two times a day (BID) | CUTANEOUS | Status: DC
  Administered 2021-06-12: 1 via NASAL
  Filled 2021-06-12: qty 22

## 2021-06-12 NOTE — Progress Notes (Addendum)
Patient ID: Lauren Avery, female   DOB: November 14, 1927, 87 y.o.   MRN: [id number]  Preoperative note  Diagnosis LEFT INTERTROCH FRACTURE   Plan surgical treatment ORIF W NAIL ON Friday 06/14/2021  CBC  CBC Latest Ref Rng & Units 06/12/2021 04/12/2021 04/11/2021  WBC 4.0 - 10.5 K/uL 7.6 14.6(H) 13.5(H)  Hemoglobin 12.0 - 15.0 g/dL 08.6 76.1 95.0  Hematocrit 36.0 - 46.0 % 45.3 38.4 42.0  Platelets 150 - 400 K/uL 108(L) 85(L) 97(L)     BASIC metabolic panel BMP Latest Ref Rng & Units 06/12/2021 04/12/2021 04/11/2021  Glucose 70 - 99 mg/dL 932(I) 712(W) 580(D)  BUN 8 - 23 mg/dL 16 15 12   Creatinine 0.44 - 1.00 mg/dL 9.83 3.82  Sodium 135 - 145 mmol/L 135 133(L) 131(L)  Potassium 3.5 - 5.1 mmol/L 4.2 4.1 4.3  Chloride 98 - 111 mmol/L 103 100 97(L)  CO2 22 - 32 mmol/L 23 24 27   Calcium 8.9 - 10.3 mg/dL 9.3 9.7 5.05     PT/INR  BLOOD UNITS TYPE SCREEN DONE    Radiograph 04/11/2021 CXR   FINDINGS: Normal cardiac silhouette. Chronic interstitial lung disease unchanged. No consolidation. No pneumothorax. Rib fracture identified.   IMPRESSION: 1. No evidence thoracic trauma. 2. Chronic interstitial lung disease.     Electronically Signed   By: 39.7 M.D.   On: 06/12/2021 11:27BLE T/SCREEN   EKG  SEE MEDICAL NOTE   Plain film findings  3 PART INTERTROCH LEFT HIP   OR NOTIFIED YES

## 2021-06-12 NOTE — H&P (Signed)
History and Physical    Lauren Avery LEX:[id number] DOB: 11-15-1927 DOA: 06/12/2021  PCP: Georgiann Hahn, MD   [facility] from[facility].  I have personally briefly reviewed patient's old medical records in [facility]  Chief Complaint: Mechanical fall and left hip pain.  HPI: Lauren Avery is a 87 y.o. female with medical history significant of hypertension, diabetes, osteoporosis, COPD/pulmonary fibrosis and GERD; who presented to the hospital secondary to mechanical fall and left hip pain.  Patient with limited mobility at baseline but able to transfer out of her wheelchair and walk short distances.  Reports that in the way to the bathroom this morning she ended up falling out of the wheelchair landing on her left hip and experiencing excruciating pain.  Patient denies any fever, chills, shortness of breath, cough, dysuria, hematuria, melena, hematochezia, chest pain, loss of consciousness or any other complaints.  Patient is vaccinated against COVID and boosted.  ED Course: Blood work overall stable and at baseline for patient.  X-rays demonstrating acute left hip fracture.  Physical exam with shortened and externally rotated left leg; significant pain with any movement.  IV pain medication and gentle fluid provided at time of admission.  Orthopedic service consulted and TRH contacted to place patient in the hospital for further evaluation and management.  Review of Systems: As per HPI otherwise all other systems reviewed and are negative.   Past Medical History:  Diagnosis Date   Diabetes mellitus    Fracture 10/2012   left wrist   Fracture of left clavicle    12/12 wore a sling   GERD (gastroesophageal reflux disease)    Hypertension    Osteoporosis    Pulmonary fibrosis (HCC)     Past Surgical History:  Procedure Laterality Date   FEMUR FRACTURE SURGERY      6/08 partial hip replacement right   ganglion cyst removal     left  wrist 1960   HIP FRACTURE SURGERY     1984 pin and plate placed right   HIP SURGERY     pin and plate removed 03/4966   ORIF ELBOW FRACTURE Right 02/18/2013   Procedure: OPEN REDUCTION INTERNAL FIXATION (ORIF) ELBOW/OLECRANON FRACTURE;  Surgeon: Nadara Mustard, MD;  Location: [facility] OR;  Service: Orthopedics;  Laterality: Right;   ORIF ELBOW FRACTURE Left 05/09/2015   Procedure: OPEN REDUCTION INTERNAL FIXATION (ORIF) LEFT MEDIAL HUMERAL CONDYLE FRACTURE;  Surgeon: Tarry Kos, MD;  Location: [facility] OR;  Service: Orthopedics;  Laterality: Left;    Social History  reports that she has never smoked. She has never used smokeless tobacco. She reports that she does not drink alcohol and does not use drugs.  Allergies  Allergen Reactions   Hydrochlorothiazide Other (See Comments) and Itching    Taken from faxed notes of dr Wylene Simmer (to short stay)  THROMBOCYTOPENIA Taken from faxed notes of dr Wylene Simmer (to short stay)  THROMBOCYTOPENIA   Prilosec [Omeprazole] Diarrhea    Family History  Problem Relation Age of Onset   Pancreatic cancer Brother    Stroke Mother    Diabetes Father     Prior to Admission medications   Medication Sig Start Date End Date Taking? Authorizing Provider  acetaminophen (TYLENOL) 325 MG tablet Take 2 tablets (650 mg total) by mouth every 6 (six) hours as needed. Patient taking differently: Take 650 mg by mouth in the morning and at bedtime. 04/20/13  Yes Lucrezia Starch, NP  ADVAIR DISKUS 500-50 MCG/DOSE  AEPB INHALE 1 PUFF INTO THE LUNGS TWICE DAILY Patient taking differently: Inhale 1 puff into the lungs in the morning and at bedtime. 11/04/19  Yes Mannam, Praveen, MD  calcitonin, salmon, (MIACALCIN/FORTICAL) 200 UNIT/ACT nasal spray SMARTSIG:Both Nares 05/09/21  Yes [provider]  D-5000 125 MCG (5000 UT) TABS Take 125 mcg by mouth daily. 05/23/21  Yes [provider]  ketoconazole (NIZORAL) 2 % cream Apply 1 application topically in the morning and at bedtime.  06/05/21  Yes [provider]  losartan (COZAAR) 50 MG tablet Take 50 mg by mouth daily.   Yes [provider]  pantoprazole (PROTONIX) 40 MG tablet Take 1 tablet (40 mg total) by mouth 2 (two) times daily. 09/20/15  Yes Michele Mcalpine, MD  sertraline (ZOLOFT) 25 MG tablet Take 25 mg by mouth at bedtime. 03/27/21  Yes [provider]  TRESIBA FLEXTOUCH 100 UNIT/ML SOPN FlexTouch Pen Inject 8 Units into the skin at bedtime. 11/11/18  Yes [provider]  NOVOFINE PLUS 32G X 4 MM MISC U UTD WITH TRESIBA 09/28/18   [provider]  ONETOUCH VERIO test strip USE TO SELF MONITOR BLOOD GLUCOSE 2 TIMES DAILY AND AS NEEDED 09/09/18   [provider]    Physical Exam: Vitals:   06/12/21 1500 06/12/21 1528 06/12/21 1700 06/12/21 1832  BP: (!) 161/72 (!) 161/72 (!) 135/53 (!) 146/67  Pulse: 85 86 93 96  Resp:  18 18 18   Temp:   98.3 F (36.8 C) 97.9 F (36.6 C)  TempSrc:   Oral Oral  SpO2: 97% 99% 91% 97%  Weight:      Height:        Constitutional: NAD, calm, comfortable Vitals:   06/12/21 1500 06/12/21 1528 06/12/21 1700 06/12/21 1832  BP: (!) 161/72 (!) 161/72 (!) 135/53 (!) 146/67  Pulse: 85 86 93 96  Resp:  18 18 18   Temp:   98.3 F (36.8 C) 97.9 F (36.6 C)  TempSrc:   Oral Oral  SpO2: 97% 99% 91% 97%  Weight:      Height:       Eyes: PERRL, lids and conjunctivae normal; no icterus or nystagmus. ENMT: Mucous membranes are moist. Posterior pharynx clear of any exudate or lesions.hard of hearing. Neck: normal, supple, no masses, no thyromegaly no JVD. Respiratory: Good air movement bilaterally; no using accessory muscle.  No wheezing or crackles on exam. Cardiovascular: Regular rate and rhythm, no rubs, no gallops, no JVD. Abdomen: no tenderness, no masses palpated. No hepatosplenomegaly. Bowel sounds positive.  Musculoskeletal: No cyanosis or clubbing; normal muscle tone.  Decreased range of motion secondary to pain on her left  lower extremity; left lower extremity shorter and externally rotated in comparison to right lower extremity. Skin: no rashes, lesions, ulcers. No induration Neurologic: CN 2-12 grossly intact. Sensation intact, DTR normal. Strength 5/5 in all 4.  Psychiatric: Normal judgment and insight. Normal mood.    Labs on Admission: I have personally reviewed following labs and imaging studies  CBC: Recent Labs  Lab 06/12/21 0954  WBC 7.6  HGB 14.6  HCT 45.3  MCV 97.0  PLT 108*    Basic Metabolic Panel: Recent Labs  Lab 06/12/21 0954  NA 135  K 4.2  CL 103  CO2 23  GLUCOSE 276*  BUN 16  CREATININE 0.92  CALCIUM 9.3    GFR: Estimated Creatinine Clearance: 29.4 mL/min (by C-G formula based on SCr of 0.92 mg/dL).  Urine analysis:  Component Value Date/Time   COLORURINE YELLOW 04/13/2021 2241   APPEARANCEUR HAZY (A) 04/13/2021 2241   LABSPEC 1.008 04/13/2021 2241   PHURINE 7.0 04/13/2021 2241   GLUCOSEU >=500 (A) 04/13/2021 2241   HGBUR SMALL (A) 04/13/2021 2241   BILIRUBINUR NEGATIVE 04/13/2021 2241   KETONESUR NEGATIVE 04/13/2021 2241   PROTEINUR NEGATIVE 04/13/2021 2241   UROBILINOGEN 0.2 02/17/2013 1933   NITRITE NEGATIVE 04/13/2021 2241   LEUKOCYTESUR LARGE (A) 04/13/2021 2241    Radiological Exams on Admission: DG Chest 1 View  Result Date: 06/12/2021 CLINICAL DATA:  Fall from wheelchair. EXAM: CHEST  1 VIEW COMPARISON:  Radiograph 04/11/2021 FINDINGS: Normal cardiac silhouette. Chronic interstitial lung disease unchanged. No consolidation. No pneumothorax. Rib fracture identified. IMPRESSION: 1. No evidence thoracic trauma. 2. Chronic interstitial lung disease. Electronically Signed   By: Genevive BiStewart  Edmunds M.D.   On: 06/12/2021 11:27   DG HIP UNILAT W OR W/O PELVIS 2-3 VIEWS LEFT  Result Date: 06/12/2021 CLINICAL DATA:  Left hip pain EXAM: DG HIP (WITH OR WITHOUT PELVIS) 2-3V LEFT COMPARISON:  04/11/2021 FINDINGS: Acute, comminuted intertrochanteric fracture of  the proximal left femur. Slight anterior apex angulation. Lesser trochanteric fragment displaced medially by approximately 8 mm. Greater tuberosity does not appear significantly displaced. Diffuse bony demineralization. Remote left superior and inferior pubic rami fractures. Prior left hip arthroplasty. Extensive vascular calcifications. IMPRESSION: Acute, comminuted intertrochanteric fracture of the proximal left femur. Electronically Signed   By: Duanne GuessNicholas  Plundo D.O.   On: 06/12/2021 11:25    EKG: Independently reviewed.  No acute ischemic changes.  Sinus rhythm  Assessment/Plan 1-close left Hip fracture (HCC) -Following mechanical fall while attempting to use the bathroom at her assisted living facility. -At baseline patient able to walk short distances and transfer out of her wheelchair. -Will follow hip fracture admission protocol and provide as needed analgesics -No weightbearing on 2 surgical repair -Patient high risk given her age underlying comorbidities; but currently stable and not requiring any further evaluation prior to surgery. -SCDs are in place awaiting final decision on timing for surgery from an orthopedic service. -Will check vitamin D level.  2-hypertension -Stable overall -Continue current antihypertensive regimen.  3-type 2 diabetes chronically on insulin -Will check A1c -Started on modified carbohydrate diet -Sliding scale insulin and low-dose Levemir will be given.  4-history of COPD/pulmonary fibrosis -No wheezing and no complaining of shortness of breath -Continue as needed bronchodilators and the use of Breo Ellipta (Advair equivalent).  5-history of depression -Mood overall stable -Continue Zoloft.  6-gastroesophageal reflux disease -Continue PPI.   DVT prophylaxis: SCDs Code Status:   DNR/DNI Family Communication:  Daughter at bedside. Disposition Plan:   [facility] from[facility].  Anticipated DC to:  To be  determined  Anticipated DC date:  To be determined  Anticipated DC barriers: need for surgical repair of left hip fracture.  Consults called:  Dr. Romeo AppleHarrison (orthopedic service). Admission status:  Inpatient, length of stay more than 2 midnights; MedSurg bed.    Severity of Illness: The appropriate patient status for this patient is INPATIENT. Inpatient status is judged to be reasonable and necessary in order to provide the required intensity of service to ensure the patient's safety. The patient's presenting symptoms, physical exam findings, and initial radiographic and laboratory data in the context of their chronic comorbidities is felt to place them at high risk for further clinical deterioration. Furthermore, it is not anticipated that the patient will be medically stable for discharge from the hospital  within 2 midnights of admission. The following factors support the patient status of inpatient.   " The patient's presenting symptoms include left hip pain after mechanical fall.. " The worrisome physical exam findings include left lower extremity shorter and externally rotated; with significant pain when attempting any movement.  Decreased range of motion appreciated. " The initial radiographic and laboratory data are worrisome because of stable blood work but x-rays demonstrating left hip fracture. " The chronic co-morbidities include type 2 diabetes, hypertension, osteoporosis.   * I certify that at the point of admission it is my clinical judgment that the patient will require inpatient hospital care spanning beyond 2 midnights from the point of admission due to high intensity of service, high risk for further deterioration and high frequency of surveillance required.Vassie Loll MD [facility]  How to contact the [facility] Attending or Consulting provider 7A - 7P or covering provider during after hours 7P -7A, for this patient?   Check the care team in [facility] and look for a)  attending/consulting [facility] provider listed and b) the [facility] team listed Log into [email or web address] and use [place]'s universal password to access. If you do not have the password, please contact the hospital operator. Locate the [facility] provider you are looking for under [facility] and page to a number that you can be directly reached. If you still have difficulty reaching the provider, please page the [facility] (Director on Call) for the Hospitalists listed on amion for assistance.  06/12/2021, 7:00 PM

## 2021-06-12 NOTE — ED Notes (Signed)
Kiera at [facility] updated about the patient's broken femur. Informed we will update again when we have more information.

## 2021-06-12 NOTE — ED Notes (Signed)
Patient is soiled, changed brief and placed the patient on pure wick.

## 2021-06-12 NOTE — ED Provider Notes (Addendum)
[facility] EMERGENCY DEPARTMENT Provider Note   CSN: [id number] Arrival date & time: 06/12/21  5732     History Chief Complaint  Patient presents with   Marletta Lor    Lauren Avery is a 87 y.o. female.  Patient presents via EMS s/p fall from wheelchair at ecf, c/o left hip pain. Symptoms acute onset, dull, moderate, worse w movement, non radiating. No associated numbness/weakness. Denies any faintness or dizziness prior to fall. No loc. No headache. No neck or back pain. No cp or sob. No abd pain or nv. Denies other extremity pain or injury. Skin intact. No anticoag use.   The history is provided by the patient, the EMS personnel, medical records and the nursing home.  Fall Pertinent negatives include no chest pain, no abdominal pain, no headaches and no shortness of breath.      Past Medical History:  Diagnosis Date   Diabetes mellitus    Fracture 10/2012   left wrist   Fracture of left clavicle    12/12 wore a sling   GERD (gastroesophageal reflux disease)    Hypertension    Osteoporosis    Pulmonary fibrosis (HCC)     Patient Active Problem List   Diagnosis Date Noted   Pubic ramus fracture (HCC) 04/13/2021   Pelvic fracture (HCC) 04/11/2021   Diabetes mellitus type 2 in nonobese (HCC) 04/11/2021   Coagulation disorder (HCC) 05/02/2020   Physical deconditioning 09/23/2017   Laryngopharyngeal reflux (LPR) 09/20/2015   Fracture of elbow, medial condyle, left, closed 05/09/2015   Fracture of medial condyle of elbow 05/09/2015   Long term (current) use of anticoagulants 03/21/2013   GERD (gastroesophageal reflux disease) 02/24/2013   Constipation 02/24/2013   Fracture of elbow, medial condyle, right, closed 02/19/2013    Class: Acute   Closed fracture of single pubic ramus of pelvis (HCC) 02/19/2013    Class: Acute   Bilateral sacral insufficiency fracture 02/19/2013    Class: Chronic   IPF (idiopathic pulmonary fibrosis) (HCC) 02/24/2012   Chronic cough  02/24/2012   DM 11/12/2007   Essential hypertension 11/12/2007    Past Surgical History:  Procedure Laterality Date   FEMUR FRACTURE SURGERY      6/08 partial hip replacement right   ganglion cyst removal     left wrist 1960   HIP FRACTURE SURGERY     1984 pin and plate placed right   HIP SURGERY     pin and plate removed 01/253   ORIF ELBOW FRACTURE Right 02/18/2013   Procedure: OPEN REDUCTION INTERNAL FIXATION (ORIF) ELBOW/OLECRANON FRACTURE;  Surgeon: Nadara Mustard, MD;  Location: [facility] OR;  Service: Orthopedics;  Laterality: Right;   ORIF ELBOW FRACTURE Left 05/09/2015   Procedure: OPEN REDUCTION INTERNAL FIXATION (ORIF) LEFT MEDIAL HUMERAL CONDYLE FRACTURE;  Surgeon: Tarry Kos, MD;  Location: [facility] OR;  Service: Orthopedics;  Laterality: Left;     OB History   No obstetric history on file.     Family History  Problem Relation Age of Onset   Pancreatic cancer Brother    Stroke Mother    Diabetes Father     Social History   Tobacco Use   Smoking status: Never   Smokeless tobacco: Never  Substance Use Topics   Alcohol use: No   Drug use: No    Home Medications Prior to Admission medications   Medication Sig Start Date End Date Taking? Authorizing Provider  D-5000 125 MCG (5000 UT) TABS Take 125 mcg by mouth daily.  05/23/21  Yes [provider]  ketoconazole (NIZORAL) 2 % cream Apply 1 application topically in the morning and at bedtime. 06/05/21  Yes [provider]  losartan (COZAAR) 50 MG tablet Take 50 mg by mouth daily.   Yes [provider]  acetaminophen (TYLENOL) 325 MG tablet Take 2 tablets (650 mg total) by mouth every 6 (six) hours as needed. Patient taking differently: Take 650 mg by mouth every 6 (six) hours as needed for mild pain, fever or headache. 04/20/13   Lucrezia StarchBerry, Edwina, NP  ADVAIR DISKUS 500-50 MCG/DOSE AEPB INHALE 1 PUFF INTO THE LUNGS TWICE DAILY Patient taking differently: Inhale 1 puff into the lungs at bedtime. 11/04/19    Chilton GreathouseMannam, Praveen, MD  calcitonin, salmon, (MIACALCIN/FORTICAL) 200 UNIT/ACT nasal spray SMARTSIG:Both Nares 05/09/21   [provider]  NOVOFINE PLUS 32G X 4 MM MISC U UTD WITH TRESIBA 09/28/18   [provider]  ONETOUCH VERIO test strip USE TO SELF MONITOR BLOOD GLUCOSE 2 TIMES DAILY AND AS NEEDED 09/09/18   [provider]  pantoprazole (PROTONIX) 40 MG tablet Take 1 tablet (40 mg total) by mouth 2 (two) times daily. Patient taking differently: Take 40 mg by mouth daily. 09/20/15   Michele McalpineNadel, Scott M, MD  sertraline (ZOLOFT) 25 MG tablet Take 25 mg by mouth at bedtime. 03/27/21   [provider]  TRESIBA FLEXTOUCH 100 UNIT/ML SOPN FlexTouch Pen Inject 8 Units into the skin at bedtime. 11/11/18   [provider]    Allergies    Hydrochlorothiazide and Prilosec [omeprazole]  Review of Systems   Review of Systems  Constitutional:  Negative for chills and fever.  HENT:  Negative for nosebleeds.   Eyes:  Negative for pain and redness.  Respiratory:  Negative for shortness of breath.   Cardiovascular:  Negative for chest pain.  Gastrointestinal:  Negative for abdominal pain and vomiting.  Genitourinary:  Negative for flank pain.  Musculoskeletal:  Negative for back pain and neck pain.  Skin:  Negative for wound.  Neurological:  Negative for weakness, numbness and headaches.  Hematological:  Does not bruise/bleed easily.  Psychiatric/Behavioral:  Negative for confusion.    Physical Exam Updated Vital Signs BP (!) 194/83 (BP Location: Left Arm)   Pulse (!) 102   Temp 97.9 F (36.6 C) (Oral)   Resp 19   Ht 1.549 m (5\' 1" )   Wt 49 kg   SpO2 100%   BMI 20.41 kg/m   Physical Exam Vitals and nursing note reviewed.  Constitutional:      Appearance: Normal appearance. She is well-developed.  HENT:     Head: Atraumatic.     Nose: Nose normal.     Mouth/Throat:     Mouth: Mucous membranes are moist.  Eyes:     General: No scleral icterus.     Conjunctiva/sclera: Conjunctivae normal.     Pupils: Pupils are equal, round, and reactive to light.  Neck:     Trachea: No tracheal deviation.  Cardiovascular:     Rate and Rhythm: Normal rate and regular rhythm.     Pulses: Normal pulses.     Heart sounds: Normal heart sounds. No murmur heard.   No friction rub. No gallop.  Pulmonary:     Effort: Pulmonary effort is normal. No respiratory distress.     Breath sounds: Normal breath sounds.  Abdominal:     General: Bowel sounds are normal. There is no distension.     Palpations: Abdomen is soft.  Tenderness: There is no abdominal tenderness. There is no guarding.  Genitourinary:    Comments: No cva tenderness.  Musculoskeletal:        General: No swelling.     Cervical back: Normal range of motion and neck supple. No rigidity. No muscular tenderness.     Comments: CTLS spine, non tender, aligned, no step off. Rotation of LLE, tenderness/pain left hip. Distal pulses palp bil. No other focal bony tenderness on bilateral extremity exam.   Skin:    General: Skin is warm and dry.     Findings: No rash.  Neurological:     Mental Status: She is alert.     Comments: Alert, speech normal. GCS 15. Motor/sens grossly intact bil.   Psychiatric:        Mood and Affect: Mood normal.    ED Results / Procedures / Treatments   Labs (all labs ordered are listed, but only abnormal results are displayed) Results for orders placed or performed during the hospital encounter of 06/12/21  Basic metabolic panel  Result Value Ref Range   Sodium 135 135 - 145 mmol/L   Potassium 4.2 3.5 - 5.1 mmol/L   Chloride 103 98 - 111 mmol/L   CO2 23 22 - 32 mmol/L   Glucose, Bld 276 (H) 70 - 99 mg/dL   BUN 16 8 - 23 mg/dL   Creatinine, Ser 1.61 0.44 - 1.00 mg/dL   Calcium 9.3 8.9 - 09.6 mg/dL   GFR, Estimated 58 (L) >60 mL/min   Anion gap 9 5 - 15  CBC  Result Value Ref Range   WBC 7.6 4.0 - 10.5 K/uL   RBC 4.67 3.87 - 5.11 MIL/uL   Hemoglobin 14.6  12.0 - 15.0 g/dL   HCT 04.5 40.9 - 81.1 %   MCV 97.0 80.0 - 100.0 fL   MCH 31.3 26.0 - 34.0 pg   MCHC 32.2 30.0 - 36.0 g/dL   RDW 91.4 (H) 78.2 - 95.6 %   Platelets 108 (L) 150 - 400 K/uL   nRBC 0.0 0.0 - 0.2 %   DG Chest 1 View  Result Date: 06/12/2021 CLINICAL DATA:  Fall from wheelchair. EXAM: CHEST  1 VIEW COMPARISON:  Radiograph 04/11/2021 FINDINGS: Normal cardiac silhouette. Chronic interstitial lung disease unchanged. No consolidation. No pneumothorax. Rib fracture identified. IMPRESSION: 1. No evidence thoracic trauma. 2. Chronic interstitial lung disease. Electronically Signed   By: Genevive Bi M.D.   On: 06/12/2021 11:27   DG HIP UNILAT W OR W/O PELVIS 2-3 VIEWS LEFT  Result Date: 06/12/2021 CLINICAL DATA:  Left hip pain EXAM: DG HIP (WITH OR WITHOUT PELVIS) 2-3V LEFT COMPARISON:  04/11/2021 FINDINGS: Acute, comminuted intertrochanteric fracture of the proximal left femur. Slight anterior apex angulation. Lesser trochanteric fragment displaced medially by approximately 8 mm. Greater tuberosity does not appear significantly displaced. Diffuse bony demineralization. Remote left superior and inferior pubic rami fractures. Prior left hip arthroplasty. Extensive vascular calcifications. IMPRESSION: Acute, comminuted intertrochanteric fracture of the proximal left femur. Electronically Signed   By: Duanne Guess D.O.   On: 06/12/2021 11:25     EKG None  Radiology DG Chest 1 View  Result Date: 06/12/2021 CLINICAL DATA:  Fall from wheelchair. EXAM: CHEST  1 VIEW COMPARISON:  Radiograph 04/11/2021 FINDINGS: Normal cardiac silhouette. Chronic interstitial lung disease unchanged. No consolidation. No pneumothorax. Rib fracture identified. IMPRESSION: 1. No evidence thoracic trauma. 2. Chronic interstitial lung disease. Electronically Signed   By: Genevive Bi M.D.   On: 06/12/2021  11:27   DG HIP UNILAT W OR W/O PELVIS 2-3 VIEWS LEFT  Result Date: 06/12/2021 CLINICAL DATA:  Left  hip pain EXAM: DG HIP (WITH OR WITHOUT PELVIS) 2-3V LEFT COMPARISON:  04/11/2021 FINDINGS: Acute, comminuted intertrochanteric fracture of the proximal left femur. Slight anterior apex angulation. Lesser trochanteric fragment displaced medially by approximately 8 mm. Greater tuberosity does not appear significantly displaced. Diffuse bony demineralization. Remote left superior and inferior pubic rami fractures. Prior left hip arthroplasty. Extensive vascular calcifications. IMPRESSION: Acute, comminuted intertrochanteric fracture of the proximal left femur. Electronically Signed   By: Duanne Guess D.O.   On: 06/12/2021 11:25    Procedures Procedures   Medications Ordered in ED Medications  morphine 4 MG/ML injection 4 mg (has no administration in time range)  ondansetron (ZOFRAN) injection 4 mg (has no administration in time range)    ED Course  I have reviewed the triage vital signs and the nursing notes.  Pertinent labs & imaging results that were available during my care of the patient were reviewed by me and considered in my medical decision making (see chart for details).    MDM Rules/Calculators/A&P                         Iv ns. Morphine iv. Zofran iv. Labs sent. Xrays.   Reviewed nursing notes and prior charts for additional history. Pt indicates at baseline is in wheelchair, but does walk short distances w walker.   Xrays reviewed/interpreted by me - left hip fx.  Labs reviewed/interpreted by me - hgb normal.   Pain improved, but persists. Dilaudid .5 mg iv.   Ortho consulted.   Hospitalists consulted for admission. Discussed pt - will admit.  Ortho repaged x3 - await return.  Reach ortho/Dr Romeo Apple on cell phone, discussed pt - he will see, and indicates once he sees pt, checks schedule, etc, he will leave in his note tentative plan for when he plans to take patient to OR.        Final Clinical Impression(s) / ED Diagnoses Final diagnoses:  None    Rx /  DC Orders ED Discharge Orders     None          Cathren Laine, MD 06/12/21 1520

## 2021-06-12 NOTE — ED Triage Notes (Signed)
Pt here from [facility] after falling from her wheelchair; reports wheelchair fell on her after the fall, denies LOC or hitting her head; pt c/o left hip pain, toes noted to be rotated out with no shortening or obvious deformity; EDP at bedside for MSE

## 2021-06-12 NOTE — ED Notes (Signed)
ED TO INPATIENT HANDOFF REPORT  ED Nurse Name and Phone #: [phone]  S Name/Age/Gender Lauren Avery 87 y.o. female Room/Bed: APA14/APA14  Code Status   Code Status: DNR  Home/SNF/Other Skilled nursing facility Patient oriented to: self, place, time and situation Is this baseline? Yes   Triage Complete: Triage complete  Chief Complaint Hip fracture (HCC) [S72.009A]  Triage Note Pt here from [facility] after falling from her wheelchair; reports wheelchair fell on her after the fall, denies LOC or hitting her head; pt c/o left hip pain, toes noted to be rotated out with no shortening or obvious deformity; EDP at bedside for MSE    Allergies Allergies  Allergen Reactions  . Hydrochlorothiazide Other (See Comments) and Itching    Taken from faxed notes of dr Wylene Simmer (to short stay)  THROMBOCYTOPENIA Taken from faxed notes of dr Wylene Simmer (to short stay)  THROMBOCYTOPENIA  . Prilosec [Omeprazole] Diarrhea    Level of Care/Admitting Diagnosis ED Disposition    ED Disposition  Admit   Condition  --   Comment  Hospital Area: [facility] [[id number]]  Level of Care: Med-Surg [16]  Covid Evaluation: Asymptomatic Screening Protocol (No Symptoms)  Diagnosis: Hip fracture [facility]) [[id number]]  Admitting Physician: Vassie Loll [[id number]]  Attending Physician: Vassie Loll [[id number]]  Estimated length of stay: past midnight tomorrow  Certification:: I certify this patient will need inpatient services for at least 2 midnights         B Medical/Surgery History Past Medical History:  Diagnosis Date  . Diabetes mellitus   . Fracture 10/2012   left wrist  . Fracture of left clavicle    12/12 wore a sling  . GERD (gastroesophageal reflux disease)   . Hypertension   . Osteoporosis   . Pulmonary fibrosis (HCC)    Past Surgical History:  Procedure Laterality Date  . FEMUR FRACTURE SURGERY      6/08 partial hip replacement right  . ganglion cyst removal     left wrist  1960  . HIP FRACTURE SURGERY     1984 pin and plate placed right  . HIP SURGERY     pin and plate removed 05/8468  . ORIF ELBOW FRACTURE Right 02/18/2013   Procedure: OPEN REDUCTION INTERNAL FIXATION (ORIF) ELBOW/OLECRANON FRACTURE;  Surgeon: Nadara Mustard, MD;  Location: [facility] OR;  Service: Orthopedics;  Laterality: Right;  . ORIF ELBOW FRACTURE Left 05/09/2015   Procedure: OPEN REDUCTION INTERNAL FIXATION (ORIF) LEFT MEDIAL HUMERAL CONDYLE FRACTURE;  Surgeon: Tarry Kos, MD;  Location: [facility] OR;  Service: Orthopedics;  Laterality: Left;     A IV Location/Drains/Wounds Patient Lines/Drains/Airways Status    Active Line/Drains/Airways    Name Placement date Placement time Site Days   Peripheral IV 06/12/21 20 G Anterior;Right Antecubital 06/12/21  0950  Antecubital  less than 1   External Urinary Catheter --  --  --  --   Incision 02/18/13 Arm Right 02/18/13  1527  -- 3036   Incision (Closed) 05/09/15 Arm Left 05/09/15  1310  -- 2226          Intake/Output Last 24 hours No intake or output data in the 24 hours ending 06/12/21 1721  Labs/Imaging Results for orders placed or performed during the hospital encounter of 06/12/21 (from the past 48 hour(s))  Basic metabolic panel     Status: Abnormal   Collection Time: 06/12/21  9:54 AM  Result Value Ref Range   Sodium 135 135 - 145 mmol/L   Potassium  4.2 3.5 - 5.1 mmol/L   Chloride 103 98 - 111 mmol/L   CO2 23 22 - 32 mmol/L   Glucose, Bld 276 (H) 70 - 99 mg/dL    Comment: Glucose reference range applies only to samples taken after fasting for at least 8 hours.   BUN 16 8 - 23 mg/dL   Creatinine, Ser 6.50 0.44 - 1.00 mg/dL   Calcium 9.3 8.9 - 35.4 mg/dL   GFR, Estimated 58 (L) >60 mL/min    Comment: (NOTE) Calculated using the CKD-EPI Creatinine Equation (2021)    Anion gap 9 5 - 15    Comment: Performed at [facility], [address] [place], [place] [address]  CBC     Status: Abnormal   Collection Time: 06/12/21  9:54 AM   Result Value Ref Range   WBC 7.6 4.0 - 10.5 K/uL   RBC 4.67 3.87 - 5.11 MIL/uL   Hemoglobin 14.6 12.0 - 15.0 g/dL   HCT 27.5 17.0 - 01.7 %   MCV 97.0 80.0 - 100.0 fL   MCH 31.3 26.0 - 34.0 pg   MCHC 32.2 30.0 - 36.0 g/dL   RDW 49.4 (H) 49.6 - 75.9 %   Platelets 108 (L) 150 - 400 K/uL    Comment: SPECIMEN CHECKED FOR CLOTS Immature Platelet Fraction may be clinically indicated, consider ordering this additional test [id number] PLATELET COUNT CONFIRMED BY SMEAR    nRBC 0.0 0.0 - 0.2 %    Comment: Performed at [facility], [address], [place], [place] [address]  Type and screen Ordered by PROVIDER DEFAULT     Status: None   Collection Time: 06/12/21 11:27 AM  Result Value Ref Range   ABO/RH(D) O POS    Antibody Screen NEG    Sample Expiration      06/15/2021,2359 Performed at [facility], [address] [place], [place] [address]   CBG monitoring, ED     Status: Abnormal   Collection Time: 06/12/21  1:23 PM  Result Value Ref Range   Glucose-Capillary 215 (H) 70 - 99 mg/dL    Comment: Glucose reference range applies only to samples taken after fasting for at least 8 hours.   DG Chest 1 View  Result Date: 06/12/2021 CLINICAL DATA:  Fall from wheelchair. EXAM: CHEST  1 VIEW COMPARISON:  Radiograph 04/11/2021 FINDINGS: Normal cardiac silhouette. Chronic interstitial lung disease unchanged. No consolidation. No pneumothorax. Rib fracture identified. IMPRESSION: 1. No evidence thoracic trauma. 2. Chronic interstitial lung disease. Electronically Signed   By: Genevive Bi M.D.   On: 06/12/2021 11:27   DG HIP UNILAT W OR W/O PELVIS 2-3 VIEWS LEFT  Result Date: 06/12/2021 CLINICAL DATA:  Left hip pain EXAM: DG HIP (WITH OR WITHOUT PELVIS) 2-3V LEFT COMPARISON:  04/11/2021 FINDINGS: Acute, comminuted intertrochanteric fracture of the proximal left femur. Slight anterior apex angulation. Lesser trochanteric fragment displaced medially by approximately 8 mm. Greater tuberosity does  not appear significantly displaced. Diffuse bony demineralization. Remote left superior and inferior pubic rami fractures. Prior left hip arthroplasty. Extensive vascular calcifications. IMPRESSION: Acute, comminuted intertrochanteric fracture of the proximal left femur. Electronically Signed   By: Duanne Guess D.O.   On: 06/12/2021 11:25    Pending Labs Unresulted Labs (From admission, onward)    Start     Ordered   06/13/21 0500  CBC  Tomorrow morning,   R        06/12/21 1535   06/13/21 0500  Basic metabolic panel  Tomorrow morning,  R        06/12/21 1535   06/12/21 1532  VITAMIN D 25 Hydroxy (Vit-D Deficiency, Fractures)  Once,   STAT        06/12/21 1535          Vitals/Pain Today's Vitals   06/12/21 1501 06/12/21 1528 06/12/21 1644 06/12/21 1700  BP:  (!) 161/72  (!) 135/53  Pulse:  86  93  Resp:  18  18  Temp:    98.3 F (36.8 C)  TempSrc:    Oral  SpO2:  99%  91%  Weight:      Height:      PainSc: 10-Worst pain ever  0-No pain     Isolation Precautions No active isolations  Medications Medications  dextrose 50 % solution 25 g (25 g Intravenous Not Given 06/12/21 1334)  HYDROcodone-acetaminophen (NORCO/VICODIN) 5-325 MG per tablet 1-2 tablet (has no administration in time range)  0.9 %  sodium chloride infusion ( Intravenous New Bag/Given 06/12/21 1648)  methocarbamol (ROBAXIN) tablet 500 mg (has no administration in time range)    Or  methocarbamol (ROBAXIN) 500 mg in dextrose 5 % 50 mL IVPB (has no administration in time range)  ondansetron (ZOFRAN) injection 4 mg (has no administration in time range)  HYDROmorphone (DILAUDID) injection 0.5-1 mg (has no administration in time range)  acetaminophen (TYLENOL) tablet 650 mg (has no administration in time range)  sertraline (ZOLOFT) tablet 25 mg (has no administration in time range)  losartan (COZAAR) tablet 25 mg (25 mg Oral Given 06/12/21 1658)  fluticasone furoate-vilanterol (BREO ELLIPTA) 200-25 MCG/INH 1  puff (has no administration in time range)  pantoprazole (PROTONIX) EC tablet 40 mg (has no administration in time range)  morphine 4 MG/ML injection 4 mg (4 mg Intravenous Given 06/12/21 1011)  ondansetron (ZOFRAN) injection 4 mg (4 mg Intravenous Given 06/12/21 1011)  HYDROmorphone (DILAUDID) injection 0.5 mg (0.5 mg Intravenous Given 06/12/21 1053)  HYDROmorphone (DILAUDID) injection 0.5 mg (0.5 mg Intravenous Given 06/12/21 1525)    Mobility walks with device High fall risk   Focused Assessments    R Recommendations: See Admitting Provider Note  Report given to:   Additional Notes:

## 2021-06-13 DIAGNOSIS — S72002A Fracture of unspecified part of neck of left femur, initial encounter for closed fracture: Secondary | ICD-10-CM | POA: Diagnosis not present

## 2021-06-13 LAB — CBC
HCT: 33.6 % — ABNORMAL LOW (ref 36.0–46.0)
Hemoglobin: 10.4 g/dL — ABNORMAL LOW (ref 12.0–15.0)
MCH: 31.9 pg (ref 26.0–34.0)
MCHC: 31 g/dL (ref 30.0–36.0)
MCV: 103.1 fL — ABNORMAL HIGH (ref 80.0–100.0)
Platelets: 117 10*3/uL — ABNORMAL LOW (ref 150–400)
RBC: 3.26 MIL/uL — ABNORMAL LOW (ref 3.87–5.11)
RDW: 15.9 % — ABNORMAL HIGH (ref 11.5–15.5)
WBC: 13.5 10*3/uL — ABNORMAL HIGH (ref 4.0–10.5)
nRBC: 0 % (ref 0.0–0.2)

## 2021-06-13 LAB — HEMOGLOBIN A1C
Hgb A1c MFr Bld: 7.3 % — ABNORMAL HIGH (ref 4.8–5.6)
Mean Plasma Glucose: 162.81 mg/dL

## 2021-06-13 LAB — GLUCOSE, CAPILLARY
Glucose-Capillary: 224 mg/dL — ABNORMAL HIGH (ref 70–99)
Glucose-Capillary: 211 mg/dL — ABNORMAL HIGH (ref 70–99)
Glucose-Capillary: 147 mg/dL — ABNORMAL HIGH (ref 70–99)
Glucose-Capillary: 187 mg/dL — ABNORMAL HIGH (ref 70–99)

## 2021-06-13 LAB — BASIC METABOLIC PANEL
Anion gap: 6 (ref 5–15)
BUN: 29 mg/dL — ABNORMAL HIGH (ref 8–23)
CO2: 26 mmol/L (ref 22–32)
Calcium: 8.8 mg/dL — ABNORMAL LOW (ref 8.9–10.3)
Chloride: 106 mmol/L (ref 98–111)
Creatinine, Ser: 1.14 mg/dL — ABNORMAL HIGH (ref 0.44–1.00)
GFR, Estimated: 45 mL/min — ABNORMAL LOW (ref >60)
Glucose, Bld: 291 mg/dL — ABNORMAL HIGH (ref 70–99)
Potassium: 5.2 mmol/L — ABNORMAL HIGH (ref 3.5–5.1)
Sodium: 138 mmol/L (ref 135–145)

## 2021-06-13 MED ORDER — SODIUM ZIRCONIUM CYCLOSILICATE 10 G PO PACK
10.0000 g | PACK | Freq: Once | ORAL | Status: AC
  Administered 2021-06-13: 10 g via ORAL
  Filled 2021-06-13: qty 1

## 2021-06-13 MED ORDER — ENOXAPARIN SODIUM 30 MG/0.3ML IJ SOSY
30.0000 mg | PREFILLED_SYRINGE | Freq: Once | INTRAMUSCULAR | Status: AC
  Administered 2021-06-13: 30 mg via SUBCUTANEOUS
  Filled 2021-06-13: qty 0.3

## 2021-06-13 MED ORDER — DIAZEPAM 2 MG PO TABS
2.0000 mg | ORAL_TABLET | Freq: Four times a day (QID) | ORAL | Status: DC
  Administered 2021-06-13 (×2): 2 mg via ORAL
  Filled 2021-06-13 (×2): qty 1

## 2021-06-13 MED ORDER — DIAZEPAM 2 MG PO TABS: 2.0000 mg | ORAL_TABLET | Freq: Four times a day (QID) | ORAL | Status: DC | PRN

## 2021-06-13 MED ORDER — LABETALOL HCL 5 MG/ML IV SOLN: 10.0000 mg | INTRAVENOUS | Status: DC | PRN

## 2021-06-13 MED ORDER — INSULIN DETEMIR 100 UNIT/ML ~~LOC~~ SOLN
5.0000 [IU] | Freq: Every day | SUBCUTANEOUS | Status: DC
  Administered 2021-06-14: 5 [IU] via SUBCUTANEOUS
  Filled 2021-06-13 (×3): qty 0.05

## 2021-06-13 NOTE — H&P (View-Only) (Signed)
HOSPITAL CONSULT  [facility]   Patient ID: Lauren Avery, female   DOB: 1927-01-23, 87 y.o.   MRN: [id number]  New patient  Requested by: Dr. Shon Hale  Reason for: Left hip fracture  Based on the information below I recommend open reduction internal fixation left hip with intramedullary implant*  Chief Complaint  Patient presents with   Fall     HPI  87 year old female status post right hip fracture with ORIF followed by removal of hardware and eventual right cemented bipolar replacement presents with new left hip pain secondary to mechanical fall complains of deformity left lower extremity inability to walk with severe pain and muscle spasms  Date of injury June 12, 2021 According to the patient and her daughter she has not been ambulatory recently but has been undergoing physical therapy with bed to wheelchair   Review of Systems (all) ROS Weakness back pain poor gait poor overall function  Otherwise no chest pain shortness of breath and remaining systems were negative  Past Medical History:  Diagnosis Date   Diabetes mellitus    Fracture 10/2012   left wrist   Fracture of left clavicle    12/12 wore a sling   GERD (gastroesophageal reflux disease)    Hypertension    Osteoporosis    Pulmonary fibrosis (HCC)     Past Surgical History:  Procedure Laterality Date   FEMUR FRACTURE SURGERY      6/08 partial hip replacement right   ganglion cyst removal     left wrist 1960   HIP FRACTURE SURGERY     1984 pin and plate placed right   HIP SURGERY     pin and plate removed 01/4741   ORIF ELBOW FRACTURE Right 02/18/2013   Procedure: OPEN REDUCTION INTERNAL FIXATION (ORIF) ELBOW/OLECRANON FRACTURE;  Surgeon: Nadara Mustard, MD;  Location: [facility] OR;  Service: Orthopedics;  Laterality: Right;   ORIF ELBOW FRACTURE Left 05/09/2015   Procedure: OPEN REDUCTION INTERNAL FIXATION (ORIF) LEFT MEDIAL HUMERAL CONDYLE FRACTURE;  Surgeon: Tarry Kos, MD;   Location: [facility] OR;  Service: Orthopedics;  Laterality: Left;    Family History  Problem Relation Age of Onset   Pancreatic cancer Brother    Stroke Mother    Diabetes Father    Social History   Tobacco Use   Smoking status: Never   Smokeless tobacco: Never  Substance Use Topics   Alcohol use: No   Drug use: No   Allergies  Allergen Reactions   Hydrochlorothiazide Other (See Comments) and Itching    Taken from faxed notes of dr Wylene Simmer (to short stay)  THROMBOCYTOPENIA Taken from faxed notes of dr Wylene Simmer (to short stay)  THROMBOCYTOPENIA   Prilosec [Omeprazole] Diarrhea    Current Facility-Administered Medications:    acetaminophen (TYLENOL) tablet 650 mg, 650 mg, Oral, Q6H PRN, Vassie Loll, MD   ceFAZolin (ANCEF) IVPB 2g/100 mL premix, 2 g, Intravenous, On Call to OR, Vickki Hearing, MD   dextrose 50 % solution 25 g, 25 g, Intravenous, Once, Cathren Laine, MD   diazepam (VALIUM) tablet 2 mg, 2 mg, Oral, Q6H, Vickki Hearing, MD   enoxaparin (LOVENOX) injection 30 mg, 30 mg, Subcutaneous, Once, Vickki Hearing, MD   fluticasone furoate-vilanterol (BREO ELLIPTA) 200-25 MCG/INH 1 puff, 1 puff, Inhalation, Daily, Vassie Loll, MD   HYDROcodone-acetaminophen (NORCO/VICODIN) 5-325 MG per tablet 1-2 tablet, 1-2 tablet, Oral, Q6H PRN, Vassie Loll, MD   HYDROmorphone (DILAUDID) injection 0.5-1 mg, 0.5-1 mg, Intravenous, Q3H  PRN, Vassie Loll, MD, 1 mg at 06/13/21 0744   insulin aspart (novoLOG) injection 0-9 Units, 0-9 Units, Subcutaneous, TID WC, Vassie Loll, MD   insulin detemir (LEVEMIR) injection 5 Units, 5 Units, Subcutaneous, QHS, Vassie Loll, MD, 5 Units at 06/12/21 2202   losartan (COZAAR) tablet 25 mg, 25 mg, Oral, Daily, Vassie Loll, MD, 25 mg at 06/12/21 1658   methocarbamol (ROBAXIN) tablet 500 mg, 500 mg, Oral, Q6H PRN **OR** methocarbamol (ROBAXIN) 500 mg in dextrose 5 % 50 mL IVPB, 500 mg, Intravenous, Q6H PRN, Vassie Loll, MD    ondansetron [facility]) injection 4 mg, 4 mg, Intravenous, Q6H PRN, Vassie Loll, MD   pantoprazole (PROTONIX) EC tablet 40 mg, 40 mg, Oral, Daily, Vassie Loll, MD   povidone-iodine 10 % swab 2 application, 2 application, Topical, Once, Vickki Hearing, MD   sertraline (ZOLOFT) tablet 25 mg, 25 mg, Oral, QHS, Vassie Loll, MD, 25 mg at 06/12/21 2155    Physical Exam(=30) BP 105/65 (BP Location: Left Arm)   Pulse 99   Temp 98.2 F (36.8 C)   Resp 18   Ht 5\' 1"  (1.549 m)   Wt 49 kg   SpO2 96%   BMI 20.41 kg/m   Gen. Appearance small ectomorphic body habitus Peripheral vascular system normal Lymph nodes ARE NORMAL  Gait unable to ambulate  Left Upper extremity  Inspection revealed no malalignment or asymmetry  Assessment of range of motion: Full range of motion was recorded  Assessment of stability: Elbow wrist and hand and shoulder were stable  Assessment of muscle strength and tone revealed grade 5 muscle strength and normal muscle tone  Skin was normal without rash lesion or ulceration  Right upper extremity  Inspection revealed no malalignment or asymmetry  Assessment of range of motion: Full range of motion was recorded  Assessment of stability: Elbow wrist and hand and shoulder were stable  Assessment of muscle strength and tone revealed grade 5 muscle strength and normal muscle tone  Skin was normal without rash lesion or ulceration  Right Lower extremity  Inspection revealed no malalignment or asymmetry  Assessment of range of motion: Full range of motion was recorded  Assessment of stability: Ankle, knee and hip were stable  Assessment of muscle strength and tone revealed grade 5 muscle strength and normal muscle tone  Skin was normal without rash lesion or ulceration  Left lower extremity Inspection tenderness left proximal hip with external rotation  Range of motion deferred because of pain left hip  Ankle and knee stable reduced hip not tested  because of fracture  Muscle tone normal  Skin normal Coordination was tested by finger-to-nose nose and was normal Deep tendon reflexes were 2+ in the upper extremities extremities deferred Examination of sensation by touch was normal  Mental status  Oriented to time person and place normal  Mood and affect normal without depression anxiety or agitation  Dx:   Data Reviewed  ER RECORD REVIEWED: CONFIRMS HISTORY   I reviewed the following images and the reports and my independent interpretation is three-part inner troches fracture left hip with fracture posteromedial buttress   Assessment  Left hip inner troches fracture in a 87 year old female bed to chair ambulatory status  Plan   Open treatment internal fixation left hip  The procedure has been fully reviewed with the patient; The risks and benefits of surgery have been discussed and explained and understood. Alternative treatment has also been reviewed, questions were encouraged and answered. The postoperative plan  is also been reviewed.    Vickki Hearing MD

## 2021-06-13 NOTE — Consult Note (Signed)
HOSPITAL CONSULT  [facility]   Patient ID: Lauren Avery, female   DOB: 1927-01-23, 87 y.o.   MRN: [id number]  New patient  Requested by: Dr. Shon Hale  Reason for: Left hip fracture  Based on the information below I recommend open reduction internal fixation left hip with intramedullary implant*  Chief Complaint  Patient presents with   Fall     HPI  87 year old female status post right hip fracture with ORIF followed by removal of hardware and eventual right cemented bipolar replacement presents with new left hip pain secondary to mechanical fall complains of deformity left lower extremity inability to walk with severe pain and muscle spasms  Date of injury June 12, 2021 According to the patient and her daughter she has not been ambulatory recently but has been undergoing physical therapy with bed to wheelchair   Review of Systems (all) ROS Weakness back pain poor gait poor overall function  Otherwise no chest pain shortness of breath and remaining systems were negative  Past Medical History:  Diagnosis Date   Diabetes mellitus    Fracture 10/2012   left wrist   Fracture of left clavicle    12/12 wore a sling   GERD (gastroesophageal reflux disease)    Hypertension    Osteoporosis    Pulmonary fibrosis (HCC)     Past Surgical History:  Procedure Laterality Date   FEMUR FRACTURE SURGERY      6/08 partial hip replacement right   ganglion cyst removal     left wrist 1960   HIP FRACTURE SURGERY     1984 pin and plate placed right   HIP SURGERY     pin and plate removed 01/4741   ORIF ELBOW FRACTURE Right 02/18/2013   Procedure: OPEN REDUCTION INTERNAL FIXATION (ORIF) ELBOW/OLECRANON FRACTURE;  Surgeon: Nadara Mustard, MD;  Location: [facility];  Service: Orthopedics;  Laterality: Right;   ORIF ELBOW FRACTURE Left 05/09/2015   Procedure: OPEN REDUCTION INTERNAL FIXATION (ORIF) LEFT MEDIAL HUMERAL CONDYLE FRACTURE;  Surgeon: Tarry Kos, MD;   Location: [facility];  Service: Orthopedics;  Laterality: Left;    Family History  Problem Relation Age of Onset   Pancreatic cancer Brother    Stroke Mother    Diabetes Father    Social History   Tobacco Use   Smoking status: Never   Smokeless tobacco: Never  Substance Use Topics   Alcohol use: No   Drug use: No   Allergies  Allergen Reactions   Hydrochlorothiazide Other (See Comments) and Itching    Taken from faxed notes of dr Wylene Simmer (to short stay)  THROMBOCYTOPENIA Taken from faxed notes of dr Wylene Simmer (to short stay)  THROMBOCYTOPENIA   Prilosec [Omeprazole] Diarrhea    Current Facility-Administered Medications:    acetaminophen (TYLENOL) tablet 650 mg, 650 mg, Oral, Q6H PRN, Vassie Loll, MD   ceFAZolin (ANCEF) IVPB 2g/100 mL premix, 2 g, Intravenous, On Call to OR, Vickki Hearing, MD   dextrose 50 % solution 25 g, 25 g, Intravenous, Once, Cathren Laine, MD   diazepam (VALIUM) tablet 2 mg, 2 mg, Oral, Q6H, Vickki Hearing, MD   enoxaparin (LOVENOX) injection 30 mg, 30 mg, Subcutaneous, Once, Vickki Hearing, MD   fluticasone furoate-vilanterol (BREO ELLIPTA) 200-25 MCG/INH 1 puff, 1 puff, Inhalation, Daily, Vassie Loll, MD   HYDROcodone-acetaminophen (NORCO/VICODIN) 5-325 MG per tablet 1-2 tablet, 1-2 tablet, Oral, Q6H PRN, Vassie Loll, MD   HYDROmorphone (DILAUDID) injection 0.5-1 mg, 0.5-1 mg, Intravenous, Q3H  PRN, Vassie Loll, MD, 1 mg at 06/13/21 0744   insulin aspart (novoLOG) injection 0-9 Units, 0-9 Units, Subcutaneous, TID WC, Vassie Loll, MD   insulin detemir (LEVEMIR) injection 5 Units, 5 Units, Subcutaneous, QHS, Vassie Loll, MD, 5 Units at 06/12/21 2202   losartan (COZAAR) tablet 25 mg, 25 mg, Oral, Daily, Vassie Loll, MD, 25 mg at 06/12/21 1658   methocarbamol (ROBAXIN) tablet 500 mg, 500 mg, Oral, Q6H PRN **OR** methocarbamol (ROBAXIN) 500 mg in dextrose 5 % 50 mL IVPB, 500 mg, Intravenous, Q6H PRN, Vassie Loll, MD    ondansetron [facility]) injection 4 mg, 4 mg, Intravenous, Q6H PRN, Vassie Loll, MD   pantoprazole (PROTONIX) EC tablet 40 mg, 40 mg, Oral, Daily, Vassie Loll, MD   povidone-iodine 10 % swab 2 application, 2 application, Topical, Once, Vickki Hearing, MD   sertraline (ZOLOFT) tablet 25 mg, 25 mg, Oral, QHS, Vassie Loll, MD, 25 mg at 06/12/21 2155    Physical Exam(=30) BP 105/65 (BP Location: Left Arm)   Pulse 99   Temp 98.2 F (36.8 C)   Resp 18   Ht 5\' 1"  (1.549 m)   Wt 49 kg   SpO2 96%   BMI 20.41 kg/m   Gen. Appearance small ectomorphic body habitus Peripheral vascular system normal Lymph nodes ARE NORMAL  Gait unable to ambulate  Left Upper extremity  Inspection revealed no malalignment or asymmetry  Assessment of range of motion: Full range of motion was recorded  Assessment of stability: Elbow wrist and hand and shoulder were stable  Assessment of muscle strength and tone revealed grade 5 muscle strength and normal muscle tone  Skin was normal without rash lesion or ulceration  Right upper extremity  Inspection revealed no malalignment or asymmetry  Assessment of range of motion: Full range of motion was recorded  Assessment of stability: Elbow wrist and hand and shoulder were stable  Assessment of muscle strength and tone revealed grade 5 muscle strength and normal muscle tone  Skin was normal without rash lesion or ulceration  Right Lower extremity  Inspection revealed no malalignment or asymmetry  Assessment of range of motion: Full range of motion was recorded  Assessment of stability: Ankle, knee and hip were stable  Assessment of muscle strength and tone revealed grade 5 muscle strength and normal muscle tone  Skin was normal without rash lesion or ulceration  Left lower extremity Inspection tenderness left proximal hip with external rotation  Range of motion deferred because of pain left hip  Ankle and knee stable reduced hip not tested  because of fracture  Muscle tone normal  Skin normal Coordination was tested by finger-to-nose nose and was normal Deep tendon reflexes were 2+ in the upper extremities extremities deferred Examination of sensation by touch was normal  Mental status  Oriented to time person and place normal  Mood and affect normal without depression anxiety or agitation  Dx:   Data Reviewed  ER RECORD REVIEWED: CONFIRMS HISTORY   I reviewed the following images and the reports and my independent interpretation is three-part inner troches fracture left hip with fracture posteromedial buttress   Assessment  Left hip inner troches fracture in a 87 year old female bed to chair ambulatory status  Plan   Open treatment internal fixation left hip  The procedure has been fully reviewed with the patient; The risks and benefits of surgery have been discussed and explained and understood. Alternative treatment has also been reviewed, questions were encouraged and answered. The postoperative plan  is also been reviewed.    Vickki Hearing MD

## 2021-06-13 NOTE — Progress Notes (Signed)
Initial Nutrition Assessment  DOCUMENTATION CODES:      INTERVENTION:  Recommend ST evaluation for appropriate texture and liquid consistency   Magic cup TID with meals, each supplement provides 290 kcal and 9 grams of protein.    NUTRITION DIAGNOSIS:   Increased nutrient needs related to hip fracture as evidenced by estimated needs.   GOAL:  Patient will meet greater than or equal to 90% of their needs   MONITOR:  PO intake, Supplement acceptance, Labs, Skin, Weight trends  REASON FOR ASSESSMENT:   Consult Hip fracture protocol  ASSESSMENT: Patient is a 87 yo female from [place]. Larey Seat out of wheelchair and presents with  complaint of left hip pain (fractured her femur). History of GERD, DM, HTN, Pulmonary fibrosis.  Per Ortho: plan for open reduction internal fixation left hip with intramedullary implant Friday 06/14/21.    Patients is resting and  looks comfortable. Daughter is at bedside and provided nutrition background. Patient has hx of swallow difficulty and is currently not eating but a few bites (applesauce and oatmeal). Holding food in her mouth and needing cues to swallow. At baseline pt takes herself to the dining room at [facility] and is able to feed herself.   Patient weight has ranged between 49-51 kg the past 2+ years. Currently 49 kg. Verified by family.   Labs: BMP Latest Ref Rng & Units 06/13/2021 06/12/2021 04/12/2021  Glucose 70 - 99 mg/dL 546(E) 703(J) 009(F)  BUN 8 - 23 mg/dL 81(W) 16 15  Creatinine 0.44 - 1.00 mg/dL 2.99(B) 7.16 9.67  Sodium 135 - 145 mmol/L 138 135 133(L)  Potassium 3.5 - 5.1 mmol/L 5.2(H) 4.2 4.1  Chloride 98 - 111 mmol/L 106 103 100  CO2 22 - 32 mmol/L 26 23 24   Calcium 8.9 - 10.3 mg/dL ) 9.3 9.7   Diet Order:   Diet Order             Diet Carb Modified Fluid consistency: Nectar Thick; Room service appropriate? Yes  Diet effective now                   EDUCATION NEEDS:  Not appropriate for education at this  time  Skin:  Skin Assessment: Reviewed RN Assessment  Last BM:  prior to admission  Height:   Ht Readings from Last 1 Encounters:  06/12/21 5\' 1"  (1.549 m)    Weight:   Wt Readings from Last 1 Encounters:  06/12/21 49 kg    Ideal Body Weight:   48 kg  BMI:  Body mass index is 20.41 kg/m.  Estimated Nutritional Needs:   Kcal:  1500-1700  Protein:  65-70 gr  Fluid:  >1400 ml daily  MS,RD,CSG,LDN Contact: 06/14/21

## 2021-06-13 NOTE — Plan of Care (Signed)

## 2021-06-13 NOTE — Progress Notes (Signed)
Patient Demographics:    Lauren Avery, is a 87 y.o. female, DOB - [birth year], YTK:[id number]  Admit date - 06/12/2021   Admitting Physician Vassie Loll, MD  Outpatient Primary MD for the patient is Georgiann Hahn, MD  LOS - 1   Chief Complaint  Patient presents with   Fall        Subjective:    [name] today has no fevers, no emesis,  No chest pain,   -Left hip pain persist  Assessment  & Plan :    Active Problems:   Hip fracture [facility])  Brief Summary:- 87 y.o. female with medical history significant of hypertension, diabetes, osteoporosis, COPD/pulmonary fibrosis and GERD admitted on 06/13/2019 with left hip fracture after mechanical fall  A/p 1)Lt Hip Fx--orthopedic consult appreciated plans for orthopedic fixation on 06/15/2019 -Continue methocarbamol and as needed IV Dilaudid  2)HTN-stable, continue losartan may use IV labetalol as needed  3)DM2-hold Levemir as patient will be n.p.o. after midnight Use Novolog/Humalog Sliding scale insulin with Accu-Cheks/Fingersticks as ordered   4) depression/anxiety--- stable, continue Zoloft 25 mg daily and Valium as ordered  Disposition/Need for in-Hospital Stay- patient unable to be discharged at this time due to -hip fracture requiring operative fixation*  Status is: Inpatient  Remains inpatient appropriate because: Please see disposition above  Disposition: The patient is from: Home              Anticipated d/c is to: Home              Anticipated d/c date is: 2 days              Patient currently is not medically stable to d/c. Barriers: Not Clinically Stable-   Code Status : -  Code Status: DNR   Family Communication:   NA (patient is alert, awake and coherent)   Consults  :  ortho  DVT Prophylaxis  :   - SCDs   SCDs Start: 06/12/21 1531 Place and maintain sequential compression device Start: 06/12/21 1529    Lab  Results  Component Value Date   PLT 117 (L) 06/13/2021    Inpatient Medications  Scheduled Meds:  dextrose  25 g Intravenous Once   diazepam  2 mg Oral Q6H   fluticasone furoate-vilanterol  1 puff Inhalation Daily   insulin aspart  0-9 Units Subcutaneous TID WC   insulin detemir  5 Units Subcutaneous QHS   losartan  25 mg Oral Daily   pantoprazole  40 mg Oral Daily   povidone-iodine  2 application Topical Once   sertraline  25 mg Oral QHS   Continuous Infusions:   ceFAZolin (ANCEF) IV     methocarbamol (ROBAXIN) IV     PRN Meds:.acetaminophen, HYDROcodone-acetaminophen, HYDROmorphone (DILAUDID) injection, methocarbamol **OR** methocarbamol (ROBAXIN) IV, ondansetron (ZOFRAN) IV    Anti-infectives (From admission, onward)    Start     Dose/Rate Route Frequency Ordered Stop   06/13/21 0600  ceFAZolin (ANCEF) IVPB 2g/100 mL premix        2 g 200 mL/hr over 30 Minutes Intravenous On call to O.R. 06/12/21 2000 06/14/21 0559         Objective:   Vitals:   06/12/21 2030 06/13/21 5573 06/13/21 0825 06/13/21 1318  BP: (!) 131/46 105/65  (!) 106/49  Pulse: 97 99  91  Resp: 15 18  18   Temp: 98 F (36.7 C) 98.2 F (36.8 C)  98 F (36.7 C)  TempSrc: Oral   Oral  SpO2: 99% 96% 93% 98%  Weight:      Height:        Wt Readings from Last 3 Encounters:  06/12/21 49 kg  04/11/21 49 kg  05/10/19 51.2 kg     Intake/Output Summary (Last 24 hours) at 06/13/2021 1803 Last data filed at 06/13/2021 0616 Gross per 24 hour  Intake --  Output 300 ml  Net -300 ml     Physical Exam  Gen:- Awake Alert,  in no apparent distress  HEENT:- [place].AT, No sclera icterus Neck-Supple Neck,No JVD,.  Lungs-  CTAB , fair symmetrical air movement CV- S1, S2 normal, regular  Abd-  +ve B.Sounds, Abd Soft, No tenderness,    Extremity/Skin:- No  edema, pedal pulses present  Psych-affect is appropriate, oriented x3 Neuro-no new focal deficits, no tremors MSK-left lower extremity shortened,  rotated and point tenderness noted   Data Review:   Micro Results Recent Results (from the past 240 hour(s))  Surgical PCR screen     Status: None   Collection Time: 06/12/21  8:03 PM   Specimen: Nasal Mucosa; Nasal Swab  Result Value Ref Range Status   MRSA, PCR NEGATIVE NEGATIVE Final   Staphylococcus aureus NEGATIVE NEGATIVE Final    Comment: (NOTE) The Xpert SA Assay (FDA approved for NASAL specimens in patients 41 years of age and older), is one component of a comprehensive surveillance program. It is not intended to diagnose infection nor to guide or monitor treatment. Performed at [facility], [address] [place], [place]     Radiology Reports DG Chest 1 View  Result Date: 06/12/2021 CLINICAL DATA:  Fall from wheelchair. EXAM: CHEST  1 VIEW COMPARISON:  Radiograph 04/11/2021 FINDINGS: Normal cardiac silhouette. Chronic interstitial lung disease unchanged. No consolidation. No pneumothorax. Rib fracture identified. IMPRESSION: 1. No evidence thoracic trauma. 2. Chronic interstitial lung disease. Electronically Signed   By: 06/11/2021 M.D.   On: 06/12/2021 11:27   DG HIP UNILAT W OR W/O PELVIS 2-3 VIEWS LEFT  Result Date: 06/12/2021 CLINICAL DATA:  Left hip pain EXAM: DG HIP (WITH OR WITHOUT PELVIS) 2-3V LEFT COMPARISON:  04/11/2021 FINDINGS: Acute, comminuted intertrochanteric fracture of the proximal left femur. Slight anterior apex angulation. Lesser trochanteric fragment displaced medially by approximately 8 mm. Greater tuberosity does not appear significantly displaced. Diffuse bony demineralization. Remote left superior and inferior pubic rami fractures. Prior left hip arthroplasty. Extensive vascular calcifications. IMPRESSION: Acute, comminuted intertrochanteric fracture of the proximal left femur. Electronically Signed   By: 06/11/2021 D.O.   On: 06/12/2021 11:25     CBC Recent Labs  Lab 06/12/21 0954 06/13/21 0557  WBC 7.6 13.5*  HGB 14.6  10.4*  HCT 45.3 33.6*  PLT 108* 117*  MCV 97.0 103.1*  MCH 31.3 31.9  MCHC 32.2 31.0  RDW 15.8* 15.9*    Chemistries  Recent Labs  Lab 06/12/21 0954 06/13/21 0557  NA 135 138  K 4.2 5.2*  CL 103 106  CO2 23 26  GLUCOSE 276* 291*  BUN 16 29*  CREATININE 0.92 1.14*  CALCIUM 9.3 8.8*   ------------------------------------------------------------------------------------------------------------------ No results for input(s): CHOL, HDL, LDLCALC, TRIG, CHOLHDL, LDLDIRECT in the last 72 hours.  Lab Results  Component Value Date   HGBA1C  7.3 (H) 06/12/2021   ------------------------------------------------------------------------------------------------------------------ No results for input(s): TSH, T4TOTAL, T3FREE, THYROIDAB in the last 72 hours.  Invalid input(s): FREET3 ------------------------------------------------------------------------------------------------------------------ No results for input(s): VITAMINB12, FOLATE, FERRITIN, TIBC, IRON, RETICCTPCT in the last 72 hours.  Coagulation profile No results for input(s): INR, PROTIME in the last 168 hours.  No results for input(s): DDIMER in the last 72 hours.  Cardiac Enzymes No results for input(s): CKMB, TROPONINI, MYOGLOBIN in the last 168 hours.  Invalid input(s): CK ------------------------------------------------------------------------------------------------------------------ No results found for: BNP   Shon Hale M.D on 06/13/2021 at 6:03 PM  Go to [email or web address] - for contact info  [facility] - Office  [phone]

## 2021-06-13 NOTE — Progress Notes (Signed)
Inpatient Diabetes Program Recommendations  AACE/ADA: New Consensus Statement on Inpatient Glycemic Control (2015)  Target Ranges:  Prepandial:   less than 140 mg/dL      Peak postprandial:   less than 180 mg/dL (1-2 hours)      Critically ill patients:  140 - 180 mg/dL   Lab Results  Component Value Date   GLUCAP 211 (H) 06/13/2021   HGBA1C 7.3 (H) 06/12/2021    Review of Glycemic Control Results for PAULETTA, PICKNEY (MRN [id number]) as of 06/13/2021 11:28  Ref. Range 06/12/2021 13:23 06/12/2021 20:41 06/13/2021 10:51  Glucose-Capillary Latest Ref Range: 70 - 99 mg/dL 254 (H) 982 (H) 641 (H)   Diabetes history: DM 2 Outpatient Diabetes medications: Tresiba 8 units Current orders for Inpatient glycemic control: Levemir 5 units Novolog 0-9 units tid  A1c 7.3% on 7/13  Inpatient Diabetes Program Recommendations:    - consider increasing Levemir to home dose 8 units  Thanks,  Christena Deem RN, MSN, BC-ADM Inpatient Diabetes Coordinator Team Pager [phone] (8a-5p)

## 2021-06-14 ENCOUNTER — Encounter (HOSPITAL_COMMUNITY)
Admission: EM | Disposition: A | Discharge status: Skilled Nursing Facility | Payer: Self-pay | Source: Home / Self Care | Attending: Family Medicine

## 2021-06-14 ENCOUNTER — Inpatient Hospital Stay (HOSPITAL_COMMUNITY): Payer: Medicare Other

## 2021-06-14 ENCOUNTER — Inpatient Hospital Stay (HOSPITAL_COMMUNITY): Payer: Medicare Other | Admitting: Certified Registered"

## 2021-06-14 ENCOUNTER — Other Ambulatory Visit: Payer: Self-pay

## 2021-06-14 ENCOUNTER — Encounter (HOSPITAL_COMMUNITY): Payer: Self-pay | Admitting: Internal Medicine

## 2021-06-14 DIAGNOSIS — S72002A Fracture of unspecified part of neck of left femur, initial encounter for closed fracture: Secondary | ICD-10-CM | POA: Diagnosis not present

## 2021-06-14 HISTORY — PX: INTRAMEDULLARY (IM) NAIL INTERTROCHANTERIC: SHX5875

## 2021-06-14 LAB — GLUCOSE, CAPILLARY
Glucose-Capillary: 193 mg/dL — ABNORMAL HIGH (ref 70–99)
Glucose-Capillary: 244 mg/dL — ABNORMAL HIGH (ref 70–99)
Glucose-Capillary: 174 mg/dL — ABNORMAL HIGH (ref 70–99)

## 2021-06-14 LAB — BASIC METABOLIC PANEL
Anion gap: 5 (ref 5–15)
BUN: 38 mg/dL — ABNORMAL HIGH (ref 8–23)
CO2: 26 mmol/L (ref 22–32)
Calcium: 8.6 mg/dL — ABNORMAL LOW (ref 8.9–10.3)
Chloride: 107 mmol/L (ref 98–111)
Creatinine, Ser: 1.16 mg/dL — ABNORMAL HIGH (ref 0.44–1.00)
GFR, Estimated: 44 mL/min — ABNORMAL LOW (ref >60)
Glucose, Bld: 215 mg/dL — ABNORMAL HIGH (ref 70–99)
Potassium: 4.6 mmol/L (ref 3.5–5.1)
Sodium: 138 mmol/L (ref 135–145)

## 2021-06-14 SURGERY — FIXATION, FRACTURE, INTERTROCHANTERIC, WITH INTRAMEDULLARY ROD
Anesthesia: Spinal | Site: Hip | Laterality: Left

## 2021-06-14 MED ORDER — SODIUM CHLORIDE 0.9 % IV SOLN
1.0000 g | INTRAVENOUS | Status: DC
  Administered 2021-06-14 – 2021-06-16 (×3): 1 g via INTRAVENOUS
  Filled 2021-06-14 (×3): qty 10

## 2021-06-14 MED ORDER — CHLORHEXIDINE GLUCONATE 0.12 % MT SOLN
15.0000 mL | Freq: Once | OROMUCOSAL | Status: AC
  Administered 2021-06-14: 15 mL via OROMUCOSAL

## 2021-06-14 MED ORDER — SODIUM CHLORIDE 0.9 % IR SOLN
Status: DC | PRN
  Administered 2021-06-14: 1000 mL

## 2021-06-14 MED ORDER — BUPIVACAINE LIPOSOME 1.3 % IJ SUSP
INTRAMUSCULAR | Status: DC | PRN
  Administered 2021-06-14: 20 mL

## 2021-06-14 MED ORDER — LIDOCAINE HCL (CARDIAC) PF 50 MG/5ML IV SOSY
PREFILLED_SYRINGE | INTRAVENOUS | Status: DC | PRN
  Administered 2021-06-14: 1 mL via INTRAVENOUS

## 2021-06-14 MED ORDER — SODIUM CHLORIDE 0.9 % IV SOLN
500.0000 mg | INTRAVENOUS | Status: DC
  Administered 2021-06-14 – 2021-06-16 (×3): 500 mg via INTRAVENOUS
  Filled 2021-06-14 (×3): qty 500

## 2021-06-14 MED ORDER — PHENYLEPHRINE HCL-NACL 10-0.9 MG/250ML-% IV SOLN
INTRAVENOUS | Status: AC
  Filled 2021-06-14: qty 250

## 2021-06-14 MED ORDER — DOCUSATE SODIUM 100 MG PO CAPS
100.0000 mg | ORAL_CAPSULE | Freq: Two times a day (BID) | ORAL | Status: DC
  Filled 2021-06-14: qty 1

## 2021-06-14 MED ORDER — ONDANSETRON HCL 4 MG/2ML IJ SOLN: 4.0000 mg | Freq: Four times a day (QID) | INTRAMUSCULAR | Status: DC | PRN

## 2021-06-14 MED ORDER — CEFAZOLIN SODIUM-DEXTROSE 2-4 GM/100ML-% IV SOLN
2.0000 g | Freq: Four times a day (QID) | INTRAVENOUS | Status: DC
  Administered 2021-06-14: 2 g via INTRAVENOUS
  Filled 2021-06-14: qty 100

## 2021-06-14 MED ORDER — ONDANSETRON HCL 4 MG PO TABS: 4.0000 mg | ORAL_TABLET | Freq: Four times a day (QID) | ORAL | Status: DC | PRN

## 2021-06-14 MED ORDER — LACTATED RINGERS IV SOLN: Freq: Once | INTRAVENOUS | Status: DC

## 2021-06-14 MED ORDER — BUPIVACAINE HCL (PF) 0.5 % IJ SOLN
INTRAMUSCULAR | Status: AC
  Filled 2021-06-14: qty 30

## 2021-06-14 MED ORDER — MENTHOL 3 MG MT LOZG: 1.0000 | LOZENGE | OROMUCOSAL | Status: DC | PRN

## 2021-06-14 MED ORDER — SODIUM CHLORIDE 0.9 % IV SOLN
INTRAVENOUS | Status: DC | PRN
  Administered 2021-06-14: 20 ug/min via INTRAVENOUS

## 2021-06-14 MED ORDER — CEFAZOLIN SODIUM-DEXTROSE 2-4 GM/100ML-% IV SOLN
INTRAVENOUS | Status: AC
  Filled 2021-06-14: qty 100

## 2021-06-14 MED ORDER — IPRATROPIUM-ALBUTEROL 0.5-2.5 (3) MG/3ML IN SOLN
3.0000 mL | Freq: Two times a day (BID) | RESPIRATORY_TRACT | Status: DC
  Administered 2021-06-14 – 2021-06-17 (×6): 3 mL via RESPIRATORY_TRACT
  Filled 2021-06-14 (×5): qty 3

## 2021-06-14 MED ORDER — ASPIRIN EC 325 MG PO TBEC
325.0000 mg | DELAYED_RELEASE_TABLET | Freq: Every day | ORAL | Status: DC
  Administered 2021-06-16 – 2021-06-17 (×2): 325 mg via ORAL
  Filled 2021-06-14 (×3): qty 1

## 2021-06-14 MED ORDER — LACTATED RINGERS IV SOLN: INTRAVENOUS | Status: DC

## 2021-06-14 MED ORDER — CEFAZOLIN SODIUM-DEXTROSE 2-3 GM-%(50ML) IV SOLR
INTRAVENOUS | Status: DC | PRN
  Administered 2021-06-14: 2 g via INTRAVENOUS

## 2021-06-14 MED ORDER — SODIUM CHLORIDE 0.9 % IV BOLUS
500.0000 mL | Freq: Once | INTRAVENOUS | Status: AC
  Administered 2021-06-14: 500 mL via INTRAVENOUS

## 2021-06-14 MED ORDER — EPHEDRINE 5 MG/ML INJ
INTRAVENOUS | Status: AC
  Filled 2021-06-14: qty 10

## 2021-06-14 MED ORDER — TRANEXAMIC ACID-NACL 1000-0.7 MG/100ML-% IV SOLN
1000.0000 mg | Freq: Once | INTRAVENOUS | Status: AC
  Administered 2021-06-14: 1000 mg via INTRAVENOUS
  Filled 2021-06-14: qty 100

## 2021-06-14 MED ORDER — METOCLOPRAMIDE HCL 10 MG PO TABS: 5.0000 mg | ORAL_TABLET | Freq: Three times a day (TID) | ORAL | Status: DC | PRN

## 2021-06-14 MED ORDER — METOCLOPRAMIDE HCL 5 MG/ML IJ SOLN: 5.0000 mg | Freq: Three times a day (TID) | INTRAMUSCULAR | Status: DC | PRN

## 2021-06-14 MED ORDER — IPRATROPIUM-ALBUTEROL 0.5-2.5 (3) MG/3ML IN SOLN: 3.0000 mL | Freq: Four times a day (QID) | RESPIRATORY_TRACT | Status: DC

## 2021-06-14 MED ORDER — PROPOFOL 10 MG/ML IV BOLUS
INTRAVENOUS | Status: DC | PRN
  Administered 2021-06-14: 20 mg via INTRAVENOUS
  Administered 2021-06-14: 30 mg via INTRAVENOUS
  Administered 2021-06-14: 15 ug/kg/min via INTRAVENOUS

## 2021-06-14 MED ORDER — PROPOFOL 10 MG/ML IV BOLUS
INTRAVENOUS | Status: AC
  Filled 2021-06-14: qty 20

## 2021-06-14 MED ORDER — EPHEDRINE SULFATE 50 MG/ML IJ SOLN
INTRAMUSCULAR | Status: DC | PRN
  Administered 2021-06-14 (×3): 5 mg via INTRAVENOUS

## 2021-06-14 MED ORDER — CHLORHEXIDINE GLUCONATE CLOTH 2 % EX PADS
6.0000 | MEDICATED_PAD | Freq: Every day | CUTANEOUS | Status: DC
  Administered 2021-06-14 – 2021-06-17 (×4): 6 via TOPICAL

## 2021-06-14 MED ORDER — ORAL CARE MOUTH RINSE: 15.0000 mL | Freq: Once | OROMUCOSAL | Status: AC

## 2021-06-14 MED ORDER — BUPIVACAINE LIPOSOME 1.3 % IJ SUSP
INTRAMUSCULAR | Status: AC
  Filled 2021-06-14: qty 20

## 2021-06-14 MED ORDER — BUPIVACAINE HCL (PF) 0.5 % IJ SOLN
INTRAMUSCULAR | Status: DC | PRN
  Administered 2021-06-14: 2.5 mL

## 2021-06-14 MED ORDER — PHENOL 1.4 % MT LIQD: 1.0000 | OROMUCOSAL | Status: DC | PRN

## 2021-06-14 SURGICAL SUPPLY — 59 items
BENZOIN TINCTURE PRP APPL 2/3 (GAUZE/BANDAGES/DRESSINGS) ×1 IMPLANT
BIT DRILL 4.0X280 (BIT) ×1 IMPLANT
BLADE SURG SZ10 CARB STEEL (BLADE) ×3 IMPLANT
BNDG GAUZE ELAST 4 BULKY (GAUZE/BANDAGES/DRESSINGS) ×3 IMPLANT
CHLORAPREP W/TINT 26 (MISCELLANEOUS) ×2 IMPLANT
CLOTH BEACON ORANGE TIMEOUT ST (SAFETY) ×2 IMPLANT
COVER LIGHT HANDLE STERIS (MISCELLANEOUS) ×4 IMPLANT
COVER MAYO STAND XLG (MISCELLANEOUS) ×2 IMPLANT
COVER PERINEAL POST (MISCELLANEOUS) ×2 IMPLANT
DRAPE STERI IOBAN 125X83 (DRAPES) ×2 IMPLANT
DRSG MEPILEX BORDER 4X12 (GAUZE/BANDAGES/DRESSINGS) ×2 IMPLANT
DRSG MEPILEX SACRM 8.7X9.8 (GAUZE/BANDAGES/DRESSINGS) ×2 IMPLANT
DRSG PAD ABDOMINAL 8X10 ST (GAUZE/BANDAGES/DRESSINGS) ×2 IMPLANT
ELECT REM PT RETURN 9FT ADLT (ELECTROSURGICAL) ×2
ELECTRODE REM PT RTRN 9FT ADLT (ELECTROSURGICAL) ×1 IMPLANT
GLOVE SKINSENSE NS SZ8.0 LF (GLOVE) ×1
GLOVE SKINSENSE STRL SZ8.0 LF (GLOVE) ×1 IMPLANT
GLOVE SS N UNI LF 8.5 STRL (GLOVE) ×2 IMPLANT
GLOVE SURG UNDER POLY LF SZ7 (GLOVE) ×4 IMPLANT
GOWN STRL REUS W/TWL LRG LVL3 (GOWN DISPOSABLE) ×2 IMPLANT
GOWN STRL REUS W/TWL XL LVL3 (GOWN DISPOSABLE) ×2 IMPLANT
GUIDEWIRE BALL NOSE 3.0X900 (WIRE) ×2
GUIDEWIRE ORTH 900X3XBALL NOSE (WIRE) IMPLANT
INST SET MAJOR BONE (KITS) ×2 IMPLANT
KIT BLADEGUARD II DBL (SET/KITS/TRAYS/PACK) ×2 IMPLANT
KIT TURNOVER CYSTO (KITS) ×2 IMPLANT
MANIFOLD NEPTUNE II (INSTRUMENTS) ×2 IMPLANT
MARKER SKIN DUAL TIP RULER LAB (MISCELLANEOUS) ×2 IMPLANT
NAIL LEFT 10X125X30 ES (Nail) ×1 IMPLANT
NDL HYPO 18GX1.5 BLUNT FILL (NEEDLE) IMPLANT
NDL HYPO 21X1.5 SAFETY (NEEDLE) ×1 IMPLANT
NDL SPNL 18GX3.5 QUINCKE PK (NEEDLE) ×1 IMPLANT
NEEDLE HYPO 18GX1.5 BLUNT FILL (NEEDLE) ×2 IMPLANT
NEEDLE HYPO 21X1.5 SAFETY (NEEDLE) ×2 IMPLANT
NEEDLE SPNL 18GX3.5 QUINCKE PK (NEEDLE) ×2 IMPLANT
NS IRRIG 1000ML POUR BTL (IV SOLUTION) ×2 IMPLANT
PACK BASIC III (CUSTOM PROCEDURE TRAY) ×2
PACK SRG BSC III STRL LF ECLPS (CUSTOM PROCEDURE TRAY) ×1 IMPLANT
PAD ABD 5X9 TENDERSORB (GAUZE/BANDAGES/DRESSINGS) ×2 IMPLANT
PAD ARMBOARD 7.5X6 YLW CONV (MISCELLANEOUS) ×2 IMPLANT
PENCIL SMOKE EVACUATOR COATED (MISCELLANEOUS) ×2 IMPLANT
PIN GUIDE THRD AR 3.2X330 (PIN) ×1 IMPLANT
SCREW LOCK CORT 5X36 (Screw) ×1 IMPLANT
SCREW LOCK LAG FEM 10.5X90 (Screw) ×1 IMPLANT
SET BASIN LINEN APH (SET/KITS/TRAYS/PACK) ×2 IMPLANT
SPONGE T-LAP 18X18 ~~LOC~~+RFID (SPONGE) ×4 IMPLANT
STAPLER VISISTAT 35W (STAPLE) IMPLANT
STRIP CLOSURE SKIN 1/2X4 (GAUZE/BANDAGES/DRESSINGS) ×1 IMPLANT
SUT BRALON NAB BRD #1 30IN (SUTURE) ×2 IMPLANT
SUT MNCRL 0 VIOLET CTX 36 (SUTURE) ×1 IMPLANT
SUT MON AB 2-0 CT1 36 (SUTURE) ×2 IMPLANT
SUT MONOCRYL 0 CTX 36 (SUTURE) ×2
SYR 20ML LL LF (SYRINGE) ×2 IMPLANT
SYR 30ML LL (SYRINGE) ×2 IMPLANT
SYR BULB IRRIG 60ML STRL (SYRINGE) ×4 IMPLANT
TOOL ACTIVATION (INSTRUMENTS) ×1 IMPLANT
TRAY FOLEY MTR SLVR 16FR STAT (SET/KITS/TRAYS/PACK) ×2 IMPLANT
WATER STERILE IRR 500ML POUR (IV SOLUTION) ×2 IMPLANT
YANKAUER SUCT BULB TIP NO VENT (SUCTIONS) ×2 IMPLANT

## 2021-06-14 NOTE — Progress Notes (Signed)
   06/14/21 1505  Assess: MEWS Score  Temp 98.4 F (36.9 C)  BP (!) 141/57  Pulse Rate (!) 133  Resp (!) 30  SpO2 92 %  O2 Device Nasal Cannula  O2 Flow Rate (L/min) 2 L/min  Assess: MEWS Score  MEWS Temp 0  MEWS Systolic 0  MEWS Pulse 3  MEWS RR 2  MEWS LOC 0  MEWS Score 5  MEWS Score Color Red  Assess: if the MEWS score is Yellow or Red  Were vital signs taken at a resting state? Yes  Focused Assessment Change from prior assessment (see assessment flowsheet)  Early Detection of Sepsis Score *See Row Information* Medium  MEWS guidelines implemented *See Row Information* Yes  Treat  MEWS Interventions Administered prn meds/treatments  Pain Scale Faces  Faces Pain Scale 10  Pain Type Surgical pain  Pain Location Hip  Pain Intervention(s) Medication (See eMAR)  Take Vital Signs  Increase Vital Sign Frequency  Red: Q 1hr X 4 then Q 4hr X 4, if remains red, continue Q 4hrs  Escalate  MEWS: Escalate Red: discuss with charge nurse/RN and provider, consider discussing with RRT  Notify: Charge Nurse/RN  Name of Charge Nurse/RN Notified Tonna Boehringer, RN  Date Charge Nurse/RN Notified 06/14/21  Time Charge Nurse/RN Notified 1510  Notify: Provider  Provider Name/Title Dr. Mariea Clonts  Date Provider Notified 06/14/21  Time Provider Notified 1506  Notification Type Page  Notification Reason Change in status  Provider response See new orders  Date of Provider Response 06/14/21  Time of Provider Response 1507  Notify: Rapid Response  Name of Rapid Response RN Notified N/A  Document  Progress note created (see row info) Yes

## 2021-06-14 NOTE — Progress Notes (Addendum)
Patient Demographics:    Lauren Avery, is a 87 y.o. female, DOB - 1927-07-23, TOI:[id number]  Admit date - 06/12/2021   Admitting Physician Vassie Loll, MD  Outpatient Primary MD for the patient is Georgiann Hahn, MD  LOS - 2   Chief Complaint  Patient presents with   Fall        Subjective:    [name] today has no fevers, no emesis,  No chest pain,   = Postop patient had hypoxia, tachypnea and tachycardia--- daughter at bedside, - Cannot rule out aspiration event  Assessment  & Plan :    Active Problems:   Hip fracture [facility])  Brief Summary:- 87 y.o. female with medical history significant of hypertension, diabetes, osteoporosis, COPD/pulmonary fibrosis and GERD admitted on 06/13/2019 with left hip fracture after mechanical fall  A/p 1)Lt Hip Fx--orthopedic consult appreciated  S/p  orthopedic fixation on 06/15/2019 -Continue methocarbamol and as needed IV Dilaudid  2)HTN-stable, continue losartan may use IV labetalol as needed  3)DM2-hold Levemir as patient is sleepy and not eating well Use Novolog/Humalog Sliding scale insulin with Accu-Cheks/Fingersticks as ordered   4) depression/anxiety--- stable, continue Zoloft 25 mg daily and Valium as ordered  5) acute hypoxic respiratory failure--- patient with underlying COPD/pulmonary fibrosis, postop on 06/14/2021 patient developed worsening hypoxia, tachypnea and tachycardia chest x-ray suggestive of possible right-sided lower lobe pneumonia--cannot rule out aspiration component -Empirically treat with IV Rocephin and azithromycin along with bronchodilators  6) social/ethics--- plan of care discussed with patient daughter at bedside, patient is a DNR/DNI  Disposition/Need for in-Hospital Stay- patient unable to be discharged at this time due to -hip fracture requiring operative fixation*  Status is: Inpatient  Remains  inpatient appropriate because: Please see disposition above  Disposition: The patient is from: Home              Anticipated d/c is to: Home              Anticipated d/c date is: 2 days              Patient currently is not medically stable to d/c. Barriers: Not Clinically Stable-   Code Status : -  Code Status: DNR   Family Communication:   NA (patient is alert, awake and coherent)  Discussed with daughter at bedside Consults  :  ortho  DVT Prophylaxis  :   - SCDs   SCDs Start: 06/14/21 1505 SCDs Start: 06/12/21 1531 Place and maintain sequential compression device Start: 06/12/21 1529   Lab Results  Component Value Date   PLT 117 (L) 06/13/2021    Inpatient Medications  Scheduled Meds:  [START ON 06/15/2021] aspirin EC  325 mg Oral Q breakfast   docusate sodium  100 mg Oral BID   fluticasone furoate-vilanterol  1 puff Inhalation Daily   insulin aspart  0-9 Units Subcutaneous TID WC   insulin detemir  5 Units Subcutaneous QHS   ipratropium-albuterol  3 mL Nebulization Q6H   losartan  25 mg Oral Daily   pantoprazole  40 mg Oral Daily   sertraline  25 mg Oral QHS   Continuous Infusions:  azithromycin     ceFAZolin     cefTRIAXone (ROCEPHIN)  IV  methocarbamol (ROBAXIN) IV     PRN Meds:.HYDROcodone-acetaminophen, HYDROmorphone (DILAUDID) injection, labetalol, menthol-cetylpyridinium **OR** phenol, methocarbamol **OR** methocarbamol (ROBAXIN) IV, metoCLOPramide **OR** metoCLOPramide (REGLAN) injection, ondansetron (ZOFRAN) IV, ondansetron **OR** ondansetron (ZOFRAN) IV  Anti-infectives (From admission, onward)    Start     Dose/Rate Route Frequency Ordered Stop   06/14/21 1930  cefTRIAXone (ROCEPHIN) 1 g in sodium chloride 0.9 % 100 mL IVPB        1 g 200 mL/hr over 30 Minutes Intravenous Every 24 hours 06/14/21 1837     06/14/21 1930  azithromycin (ZITHROMAX) 500 mg in sodium chloride 0.9 % 250 mL IVPB        500 mg 250 mL/hr over 60 Minutes Intravenous Every 24  hours 06/14/21 1837     06/14/21 1600  ceFAZolin (ANCEF) IVPB 2g/100 mL premix  Status:  Discontinued        2 g 200 mL/hr over 30 Minutes Intravenous Every 6 hours 06/14/21 1504 06/14/21 1837   06/14/21 0733  ceFAZolin (ANCEF) 2-4 GM/100ML-% IVPB       Note to Pharmacy: Waynard Edwards   : cabinet override      06/14/21 0733 06/14/21 1944   06/13/21 0600  ceFAZolin (ANCEF) IVPB 2g/100 mL premix  Status:  Discontinued        2 g 200 mL/hr over 30 Minutes Intravenous On call to O.R. 06/12/21 2000 06/14/21 0559         Objective:   Vitals:   06/14/21 1505 06/14/21 1618 06/14/21 1630 06/14/21 1709  BP: (!) 141/57 (!) 81/46  (!) 98/51  Pulse: (!) 133 (!) 132  (!) 128  Resp: (!) 30 20  16   Temp: 98.4 F (36.9 C)     TempSrc: Oral     SpO2: 92% (!) 89% 95% 98%  Weight:      Height:        Wt Readings from Last 3 Encounters:  06/12/21 49 kg  04/11/21 49 kg  05/10/19 51.2 kg     Intake/Output Summary (Last 24 hours) at 06/14/2021 1839 Last data filed at 06/14/2021 1700 Gross per 24 hour  Intake 1650 ml  Output 600 ml  Net 1050 ml     Physical Exam  Gen:- Awake Alert,  in no apparent distress  HEENT:- [place].AT, No sclera icterus Neck-Supple Neck,No JVD,.  Lungs-diminished breath sounds, Velcro type brace noted CV- S1, S2 normal, regular  Abd-  +ve B.Sounds, Abd Soft, No tenderness,    Extremity/Skin:- No  edema, pedal pulses present  Psych-affect is appropriate, oriented x3 Neuro-no new focal deficits, no tremors MSK-left lower extremity shortened, rotated and point tenderness noted   Data Review:   Micro Results Recent Results (from the past 240 hour(s))  Surgical PCR screen     Status: None   Collection Time: 06/12/21  8:03 PM   Specimen: Nasal Mucosa; Nasal Swab  Result Value Ref Range Status   MRSA, PCR NEGATIVE NEGATIVE Final   Staphylococcus aureus NEGATIVE NEGATIVE Final    Comment: (NOTE) The Xpert SA Assay (FDA approved for NASAL specimens in patients  85 years of age and older), is one component of a comprehensive surveillance program. It is not intended to diagnose infection nor to guide or monitor treatment. Performed at [facility], [address], [place], [place]     Radiology Reports DG Chest 1 View  Result Date: 06/12/2021 CLINICAL DATA:  Fall from wheelchair. EXAM: CHEST  1 VIEW COMPARISON:  Radiograph 04/11/2021 FINDINGS: Normal  cardiac silhouette. Chronic interstitial lung disease unchanged. No consolidation. No pneumothorax. Rib fracture identified. IMPRESSION: 1. No evidence thoracic trauma. 2. Chronic interstitial lung disease. Electronically Signed   By: Genevive Bi M.D.   On: 06/12/2021 11:27   DG CHEST PORT 1 VIEW  Result Date: 06/14/2021 CLINICAL DATA:  Cough and short of breath EXAM: PORTABLE CHEST 1 VIEW COMPARISON:  06/12/2021, CT 01/25/2019, radiograph 04/11/2021 FINDINGS: Chronic lung disease with diffuse pulmonary fibrosis. No acute superimposed airspace disease on the left. Possible acute airspace disease at the right base. No pleural effusion. Stable cardiomediastinal silhouette. No pneumothorax. IMPRESSION: Chronic lung disease and fibrosis with possible acute mild superimposed airspace disease at the right base. Electronically Signed   By: Jasmine Pang M.D.   On: 06/14/2021 17:16   DG HIP OPERATIVE UNILAT WITH PELVIS LEFT  Result Date: 06/14/2021 CLINICAL DATA:  Status post left hip surgery EXAM: OPERATIVE LEFT HIP (WITH PELVIS IF PERFORMED) 12 VIEWS TECHNIQUE: Fluoroscopic spot image(s) were submitted for interpretation post-operatively. COMPARISON:  None. FINDINGS: Left intertrochanteric fracture transfixed with a intramedullary nail and multiple interlocking screws. Anatomic alignment. Postsurgical changes in the soft tissues overlying the left hip. IMPRESSION: Interval left intertrochanteric fracture ORIF. Electronically Signed   By: Elige Ko   On: 06/14/2021 12:55   DG HIP UNILAT WITH PELVIS  2-3 VIEWS LEFT  Result Date: 06/14/2021 CLINICAL DATA:  Status post ORIF of the proximal left femur EXAM: DG HIP (WITH OR WITHOUT PELVIS) 2-3V LEFT COMPARISON:  06/12/2021 FINDINGS: Postoperative changes within the left femur are noted. There is been interval placement of cannulated screw and IM nail with reduction of the intertrochanteric fracture of the proximal left femur. Subacute left superior and inferior pubic rami fractures are again noted. Previous right hip arthroplasty. Diffuse osteopenia. IMPRESSION: Status post ORIF of intertrochanteric fracture of the proximal left femur. Electronically Signed   By: Signa Kell M.D.   On: 06/14/2021 13:39   DG HIP UNILAT W OR W/O PELVIS 2-3 VIEWS LEFT  Result Date: 06/12/2021 CLINICAL DATA:  Left hip pain EXAM: DG HIP (WITH OR WITHOUT PELVIS) 2-3V LEFT COMPARISON:  04/11/2021 FINDINGS: Acute, comminuted intertrochanteric fracture of the proximal left femur. Slight anterior apex angulation. Lesser trochanteric fragment displaced medially by approximately 8 mm. Greater tuberosity does not appear significantly displaced. Diffuse bony demineralization. Remote left superior and inferior pubic rami fractures. Prior left hip arthroplasty. Extensive vascular calcifications. IMPRESSION: Acute, comminuted intertrochanteric fracture of the proximal left femur. Electronically Signed   By: Duanne Guess D.O.   On: 06/12/2021 11:25     CBC Recent Labs  Lab 06/12/21 0954 06/13/21 0557  WBC 7.6 13.5*  HGB 14.6 10.4*  HCT 45.3 33.6*  PLT 108* 117*  MCV 97.0 103.1*  MCH 31.3 31.9  MCHC 32.2 31.0  RDW 15.8* 15.9*    Chemistries  Recent Labs  Lab 06/12/21 0954 06/13/21 0557 06/14/21 0536  NA 135 138 138  K 4.2 5.2* 4.6  CL 103 106 107  CO2 23 26 26   GLUCOSE 276* 291* 215*  BUN 16 29* 38*  CREATININE 0.92 1.14* 1.16*  CALCIUM 9.3 8.8* 8.6*    ------------------------------------------------------------------------------------------------------------------ No results for input(s): CHOL, HDL, LDLCALC, TRIG, CHOLHDL, LDLDIRECT in the last 72 hours.  Lab Results  Component Value Date   HGBA1C 7.3 (H) 06/12/2021   ------------------------------------------------------------------------------------------------------------------ No results for input(s): TSH, T4TOTAL, T3FREE, THYROIDAB in the last 72 hours.  Invalid input(s): FREET3 ------------------------------------------------------------------------------------------------------------------ No results for input(s): VITAMINB12, FOLATE, FERRITIN, TIBC, IRON,  RETICCTPCT in the last 72 hours.  Coagulation profile No results for input(s): INR, PROTIME in the last 168 hours.  No results for input(s): DDIMER in the last 72 hours.  Cardiac Enzymes No results for input(s): CKMB, TROPONINI, MYOGLOBIN in the last 168 hours.  Invalid input(s): CK ------------------------------------------------------------------------------------------------------------------ No results found for: BNP   Shon Haleourage Barbette Mcglaun M.D on 06/14/2021 at 6:39 PM  Go to [email or web address] - for contact info  [facility] - Office  [phone]

## 2021-06-14 NOTE — NC FL2 (Signed)
[place] MEDICAID FL2 LEVEL OF CARE SCREENING TOOL     IDENTIFICATION  Patient Name: Lauren Avery Birthdate: 01-Mar-1927 Sex: female Admission Date (Current Location): 06/12/2021  [facility] and [place] Number:  [employer] and Address:  [facility],  [address], [place] [address]      Provider Number: [id number]  Attending Physician Name and Address:  Shon Hale, MD  Relative Name and Phone Number:       Current Level of Care: Hospital Recommended Level of Care: Skilled Nursing Facility Prior Approval Number:    Date Approved/Denied:   PASRR Number: [id number]  Discharge Plan: SNF    Current Diagnoses: Patient Active Problem List   Diagnosis Date Noted   Hip fracture (HCC) 06/12/2021   Pubic ramus fracture (HCC) 04/13/2021   Pelvic fracture (HCC) 04/11/2021   Diabetes mellitus type 2 in nonobese (HCC) 04/11/2021   Coagulation disorder (HCC) 05/02/2020   Physical deconditioning 09/23/2017   Laryngopharyngeal reflux (LPR) 09/20/2015   Fracture of elbow, medial condyle, left, closed 05/09/2015   Fracture of medial condyle of elbow 05/09/2015   Long term (current) use of anticoagulants 03/21/2013   GERD (gastroesophageal reflux disease) 02/24/2013   Constipation 02/24/2013   Fracture of elbow, medial condyle, right, closed 02/19/2013   Closed fracture of single pubic ramus of pelvis (HCC) 02/19/2013   Bilateral sacral insufficiency fracture 02/19/2013   IPF (idiopathic pulmonary fibrosis) (HCC) 02/24/2012   Chronic cough 02/24/2012   DM 11/12/2007   Essential hypertension 11/12/2007    Orientation RESPIRATION BLADDER Height & Weight     Self  O2 (see dc summary) Incontinent Weight: 108 lb (49 kg) Height:  5\' 1"  (154.9 cm)  BEHAVIORAL SYMPTOMS/MOOD NEUROLOGICAL BOWEL NUTRITION STATUS      Continent Diet (see dc summary)  AMBULATORY STATUS COMMUNICATION OF NEEDS Skin   Extensive Assist Verbally Surgical wounds                        Personal Care Assistance Level of Assistance  Bathing, Feeding, Dressing Bathing Assistance: Maximum assistance Feeding assistance: Limited assistance Dressing Assistance: Maximum assistance     Functional Limitations Info  Sight, Hearing, Speech Sight Info: Adequate Hearing Info: Adequate Speech Info: Adequate    SPECIAL CARE FACTORS FREQUENCY  PT (By licensed PT), OT (By licensed OT)     PT Frequency: 5x week OT Frequency: 3x week            Contractures Contractures Info: Not present    Additional Factors Info  Code Status, Allergies Code Status Info: DNR Allergies Info: Hydrochlorothiazide, Prilosec           Current Medications (06/14/2021):  This is the current hospital active medication list Current Facility-Administered Medications  Medication Dose Route Frequency Provider Last Rate Last Admin   [MAR Hold] acetaminophen (TYLENOL) tablet 650 mg  650 mg Oral Q6H PRN 06/16/2021, MD       bupivacaine liposome (EXPAREL) 1.3 % injection    PRN Vassie Loll, MD   20 mL at 06/14/21 0935   ceFAZolin (ANCEF) 2-4 GM/100ML-% IVPB            [MAR Hold] dextrose 50 % solution 25 g  25 g Intravenous Once 06/16/21, MD       Cathren Laine Hold] diazepam (VALIUM) tablet 2 mg  2 mg Oral Q6H PRN Emokpae, Courage, MD       [MAR Hold] fluticasone furoate-vilanterol (BREO ELLIPTA) 200-25 MCG/INH  1 puff  1 puff Inhalation Daily Vassie Loll, MD   1 puff at 06/13/21 0822   [MAR Hold] HYDROcodone-acetaminophen (NORCO/VICODIN) 5-325 MG per tablet 1-2 tablet  1-2 tablet Oral Q6H PRN Vassie Loll, MD       [facility] Hold] HYDROmorphone (DILAUDID) injection 0.5-1 mg  0.5-1 mg Intravenous Q3H PRN Vassie Loll, MD   1 mg at 06/14/21 0318   [MAR Hold] insulin aspart (novoLOG) injection 0-9 Units  0-9 Units Subcutaneous TID WC Vassie Loll, MD   1 Units at 06/13/21 1734   [MAR Hold] insulin detemir (LEVEMIR) injection 5 Units  5 Units Subcutaneous QHS Emokpae, Courage,  MD       [MAR Hold] labetalol (NORMODYNE) injection 10 mg  10 mg Intravenous Q4H PRN Shon Hale, MD       lactated ringers infusion   Intravenous Continuous Windell Norfolk, MD 10 mL/hr at 06/14/21 0734 New Bag at 06/14/21 2563   lactated ringers infusion   Intravenous Once Windell Norfolk, MD       [employer] Hold] losartan (COZAAR) tablet 25 mg  25 mg Oral Daily Vassie Loll, MD   25 mg at 06/13/21 0853   [MAR Hold] methocarbamol (ROBAXIN) tablet 500 mg  500 mg Oral Q6H PRN Vassie Loll, MD       Or   Mitzi Hansen Hold] methocarbamol (ROBAXIN) 500 mg in dextrose 5 % 50 mL IVPB  500 mg Intravenous Q6H PRN Vassie Loll, MD       [MAR Hold] ondansetron [facility]) injection 4 mg  4 mg Intravenous Q6H PRN Vassie Loll, MD       [MAR Hold] pantoprazole (PROTONIX) EC tablet 40 mg  40 mg Oral Daily Vassie Loll, MD   40 mg at 06/13/21 0853   povidone-iodine 10 % swab 2 application  2 application Topical Once Vickki Hearing, MD       [facility] Hold] sertraline (ZOLOFT) tablet 25 mg  25 mg Oral QHS Vassie Loll, MD   25 mg at 06/13/21 2105   sodium chloride irrigation 0.9 %    PRN Vickki Hearing, MD   1,000 mL at 06/14/21 8937     Discharge Medications: Please see discharge summary for a list of discharge medications.  Relevant Imaging Results:  Relevant Lab Results:   Additional Information SSN: [id number] Vaccinated x2 booster x1  Elliot Gault, LCSW

## 2021-06-14 NOTE — Anesthesia Preprocedure Evaluation (Signed)
Anesthesia Evaluation  Patient identified by MRN, date of birth, ID band Patient awake    Reviewed: Allergy & Precautions, H&P , NPO status , Patient's Chart, lab work & pertinent test results, reviewed documented beta blocker date and time   Airway Mallampati: II  TM Distance: >3 FB Neck ROM: full    Dental no notable dental hx.    Pulmonary pneumonia, resolved,    Pulmonary exam normal breath sounds clear to auscultation       Cardiovascular Exercise Tolerance: Good hypertension, negative cardio ROS   Rhythm:regular Rate:Normal     Neuro/Psych negative neurological ROS  negative psych ROS   GI/Hepatic Neg liver ROS, GERD  Medicated,  Endo/Other  negative endocrine ROSdiabetes  Renal/GU negative Renal ROS  negative genitourinary   Musculoskeletal   Abdominal   Peds  Hematology negative hematology ROS (+)   Anesthesia Other Findings   Reproductive/Obstetrics negative OB ROS                             Anesthesia Physical Anesthesia Plan  ASA: 2  Anesthesia Plan: Spinal   Post-op Pain Management:    Induction:   PONV Risk Score and Plan: Propofol infusion  Airway Management Planned:   Additional Equipment:   Intra-op Plan:   Post-operative Plan:   Informed Consent: I have reviewed the patients History and Physical, chart, labs and discussed the procedure including the risks, benefits and alternatives for the proposed anesthesia with the patient or authorized representative who has indicated his/her understanding and acceptance.     Dental Advisory Given  Plan Discussed with: CRNA  Anesthesia Plan Comments:         Anesthesia Quick Evaluation

## 2021-06-14 NOTE — TOC Initial Note (Signed)
Transition of Care [facility]) - Initial/Assessment Note    Patient Details  Name: Lauren Avery MRN: [id number] Date of Birth: Sep 27, 1927  Transition of Care [facility]) CM/SW Contact:    Elliot Gault, LCSW Phone Number: 06/14/2021, 1:40 PM  Clinical Narrative:                  Pt admitted from [facility]. She is in surgery today for hip fracture repair. Anticipating pt will need SNF rehab at dc. Spoke with one of pt's daughters today to discuss. Will start referrals and pt will need insurance authorization once PT has assessed and made recommendations.  TOC will follow.  Expected Discharge Plan: Skilled Nursing Facility Barriers to Discharge: Continued Medical Work up   Patient Goals and CMS Choice Patient states their goals for this hospitalization and ongoing recovery are:: get better CMS Medicare.gov Compare Post Acute Care list provided to:: Patient Represenative (must comment) Choice offered to / list presented to : Adult Children  Expected Discharge Plan and Services Expected Discharge Plan: Skilled Nursing Facility In-house Referral: Clinical Social Work     Living arrangements for the past 2 months: Assisted Living Facility                                      Prior Living Arrangements/Services Living arrangements for the past 2 months: Assisted Living Facility Lives with:: Facility Resident Patient language and need for interpreter reviewed:: Yes Do you feel safe going back to the place where you live?: Yes      Need for Family Participation in Patient Care: No (Comment) Care giver support system in place?: Yes (comment)   Criminal Activity/Legal Involvement Pertinent to Current Situation/Hospitalization: No - Comment as needed  Activities of Daily Living Home Assistive Devices/Equipment: Wheelchair ADL Screening (condition at time of admission) Patient's cognitive ability adequate to safely complete daily activities?: No Is the patient deaf or have  difficulty hearing?: No Does the patient have difficulty seeing, even when wearing glasses/contacts?: Yes Does the patient have difficulty concentrating, remembering, or making decisions?: No Patient able to express need for assistance with ADLs?: No Does the patient have difficulty dressing or bathing?: Yes Independently performs ADLs?: No Communication: Needs assistance Is this a change from baseline?: Pre-admission baseline Dressing (OT): Needs assistance Is this a change from baseline?: Pre-admission baseline Grooming: Needs assistance Is this a change from baseline?: Pre-admission baseline Feeding: Needs assistance Is this a change from baseline?: Pre-admission baseline Bathing: Needs assistance Is this a change from baseline?: Pre-admission baseline Toileting: Needs assistance Is this a change from baseline?: Pre-admission baseline In/Out Bed: Needs assistance Is this a change from baseline?: Pre-admission baseline Walks in Home: Needs assistance Is this a change from baseline?: Pre-admission baseline Does the patient have difficulty walking or climbing stairs?: Yes Weakness of Legs: Both Weakness of Arms/Hands: None  Permission Sought/Granted Permission sought to share information with : Facility Industrial/product designer granted to share information with : Yes, Verbal Permission Granted     Permission granted to share info w AGENCY: [facility]        Emotional Assessment         Alcohol / Substance Use: Not Applicable Psych Involvement: No (comment)  Admission diagnosis:  Hip fracture (HCC) [S72.009A] Fall from chair, initial encounter [W07.XXXA] Closed 2-part intertrochanteric fracture of left femur, initial encounter [facility]) [S72.142A] Patient Active Problem List   Diagnosis Date Noted  Hip fracture (HCC) 06/12/2021   Pubic ramus fracture (HCC) 04/13/2021   Pelvic fracture (HCC) 04/11/2021   Diabetes mellitus type 2 in nonobese (HCC) 04/11/2021    Coagulation disorder (HCC) 05/02/2020   Physical deconditioning 09/23/2017   Laryngopharyngeal reflux (LPR) 09/20/2015   Fracture of elbow, medial condyle, left, closed 05/09/2015   Fracture of medial condyle of elbow 05/09/2015   Long term (current) use of anticoagulants 03/21/2013   GERD (gastroesophageal reflux disease) 02/24/2013   Constipation 02/24/2013   Fracture of elbow, medial condyle, right, closed 02/19/2013    Class: Acute   Closed fracture of single pubic ramus of pelvis (HCC) 02/19/2013    Class: Acute   Bilateral sacral insufficiency fracture 02/19/2013    Class: Chronic   IPF (idiopathic pulmonary fibrosis) (HCC) 02/24/2012   Chronic cough 02/24/2012   DM 11/12/2007   Essential hypertension 11/12/2007   PCP:  Georgiann Hahn, MD Pharmacy:   [facility] [phone] Gin[place] Otto, [place] - [address] AT [facility] [address] [facility] [address] [place] [address] Phone: [phone] Fax: [phone]     Social Determinants of Health (SDOH) Interventions    Readmission Risk Interventions No flowsheet data found.

## 2021-06-14 NOTE — Anesthesia Postprocedure Evaluation (Signed)
Anesthesia Post Note  Patient: Lauren Avery  Procedure(s) Performed: INTRAMEDULLARY (IM) NAIL INTERTROCHANTRIC (Left: Hip)  Patient location during evaluation: Phase II Anesthesia Type: Spinal Level of consciousness: awake Pain management: pain level controlled Vital Signs Assessment: post-procedure vital signs reviewed and stable Respiratory status: spontaneous breathing and respiratory function stable Cardiovascular status: blood pressure returned to baseline and stable Postop Assessment: no headache and no apparent nausea or vomiting Anesthetic complications: no Comments: Late entry   No notable events documented.   Last Vitals:  Vitals:   06/14/21 1000 06/14/21 1020  BP: 123/62   Pulse: (!) 108 (!) 101  Resp: 16 13  Temp: (!) 35.7 C   SpO2: 100% 100%    Last Pain:  Vitals:   06/14/21 1020  TempSrc:   PainSc: 0-No pain                 Windell Norfolk

## 2021-06-14 NOTE — Brief Op Note (Signed)
06/14/2021  9:52 AM  PATIENT:  Lauren Avery  87 y.o. female  PRE-OPERATIVE DIAGNOSIS:  LEFT HIP FRACTURE  POST-OPERATIVE DIAGNOSIS:  LEFT HIP FRACTURE 3 part intertrochanteric hip fracture  PROCEDURE:  Procedure(s): INTRAMEDULLARY (IM) NAIL INTERTROCHANTRIC (Left)  Implant Arthrex 125 degree 300 mm nail with 95 lag screw and 36 distal locking screw Galileo nail 10 mm in width  SURGEON:  Surgeon(s) and Role:    Vickki Hearing, MD - Primary  Surgery was done as follows.  The patient was seen in the preop area the left hip was confirmed and marked as the surgical site she was taken the operating for spinal anesthesia Foley catheter was inserted patient was placed supine on the fracture table the right leg which has a partial hip replacement was placed in extension and abduction the left leg was placed in traction over a perineal post  After traction and internal rotation radiographs confirmed adequate reduction there was a posterior fragment which was attached to the greater trochanter but it was a fragment which did not require anatomic reduction  After adequate reduction the leg was prepped and draped sterilely timeout was completed  The incision was made over the trochanter subcutaneous tissue was divided fascia was split in line with skin incision the muscle was divided bluntly the greater trochanter was posterior and the awl was placed into the trochanter of the trochanter was pulled superiorly but it was difficult to get the guide rod from the anterior cortex despite several attempts  I therefore decided to use a shorter nail and although the guide rod went down approximately 330 mm I chose a 300 mm nail to avoid anterior cortex penetration.  We made a second incision for the lag screw the subcutaneous tissue was divided the cannula was advanced down to bone the threaded tip guidewire was advanced into the femoral head and then center center position as close as I can get.   This measured approximately 95 mm.  We set the reamer for 95 and then passed the reamer over the guidewire under fluoroscopy.  The lag screw was advanced x-rays confirmed good positioning.  The internal pistoning screw was removed to allow pistoning of the screw  We then placed the distal screw in standard fashion with a third incision  Final images confirmed adequate placement of all hardware fracture was reduced  All wounds were irrigated proximal wound was closed with #1 Braylon 0 Monocryl 2-0 Monocryl and then 0 Monocryl and 2-0 Monocryl were placed in the second incision and 2-0 Monocryl in the third incision we did inject 20 cc of exparel in the subcutaneous tissue  Sterile bandage was applied  Postop plan Weightbearing as tolerated Follow-up visit in 4 weeks for x-ray DVT prevention for 30 days   PHYSICIAN ASSISTANT:   ASSISTANTS: none   ANESTHESIA:   spinal  EBL:  none   BLOOD ADMINISTERED:none  DRAINS: none   LOCAL MEDICATIONS USED:  OTHER exparel 20 cc full-strength  SPECIMEN:  No Specimen  DISPOSITION OF SPECIMEN:  N/A  COUNTS:  YES  TOURNIQUET:  * No tourniquets in log *  DICTATION: .Dragon Dictation  PLAN OF CARE: Admit to inpatient   PATIENT DISPOSITION:  PACU - hemodynamically stable.   Delay start of Pharmacological VTE agent (>24hrs) due to surgical blood loss or risk of bleeding: yes

## 2021-06-14 NOTE — Progress Notes (Signed)
Inpatient Diabetes Program Recommendations  AACE/ADA: New Consensus Statement on Inpatient Glycemic Control (2015)  Target Ranges:  Prepandial:   less than 140 mg/dL      Peak postprandial:   less than 180 mg/dL (1-2 hours)      Critically ill patients:  140 - 180 mg/dL   Lab Results  Component Value Date   GLUCAP 193 (H) 06/14/2021   HGBA1C 7.3 (H) 06/12/2021    Review of Glycemic Control Results for Lauren Avery, Lauren Avery (MRN [id number]) as of 06/14/2021 10:34  Ref. Range 06/13/2021 07:39 06/13/2021 10:51 06/13/2021 16:03 06/13/2021 20:50 06/14/2021 07:07  Glucose-Capillary Latest Ref Range: 70 - 99 mg/dL 102 (H) 585 (H) 277 (H) 187 (H) 193 (H)    Diabetes history: DM 2 Outpatient Diabetes medications: Tresiba 8 units Current orders for Inpatient glycemic control: Levemir 5 units Novolog 0-9 units tid  A1c 7.3% on 7/13  Inpatient Diabetes Program Recommendations:    - consider increasing Levemir to home dose 8 units  Thanks,  Christena Deem RN, MSN, BC-ADM Inpatient Diabetes Coordinator Team Pager [phone] (8a-5p)

## 2021-06-14 NOTE — Progress Notes (Signed)
   06/14/21 1709  Assess: MEWS Score  BP (!) 98/51  Pulse Rate (!) 128  Resp 16  SpO2 98 %  O2 Device Nasal Cannula  O2 Flow Rate (L/min) 4 L/min  Assess: MEWS Score  MEWS Temp 0  MEWS Systolic 1  MEWS Pulse 2  MEWS RR 0  MEWS LOC 0  MEWS Score 3  MEWS Score Color Yellow  Assess: if the MEWS score is Yellow or Red  Were vital signs taken at a resting state? Yes  Focused Assessment No change from prior assessment  Early Detection of Sepsis Score *See Row Information* Medium  MEWS guidelines implemented *See Row Information* Yes  Take Vital Signs  Increase Vital Sign Frequency  Yellow: Q 2hr X 2 then Q 4hr X 2, if remains yellow, continue Q 4hrs  Escalate  MEWS: Escalate Yellow: discuss with charge nurse/RN and consider discussing with provider and RRT

## 2021-06-14 NOTE — Anesthesia Procedure Notes (Signed)
Spinal  Start time: 06/14/2021 7:45 AM End time: 06/14/2021 7:49 AM Reason for block: surgical anesthesia Staffing Performed: resident/CRNA  Anesthesiologist: Windell Norfolk, MD Resident/CRNA: Brynda Peon, CRNA Preanesthetic Checklist Completed: patient identified, IV checked, site marked, risks and benefits discussed, surgical consent, monitors and equipment checked, pre-op evaluation and timeout performed Spinal Block Patient position: left lateral decubitus Prep: ChloraPrep Patient monitoring: heart rate, cardiac monitor, continuous pulse ox and blood pressure Approach: left paramedian Location: L4-5 Injection technique: single-shot Needle Needle gauge: 22 G Needle length: 10 cm Assessment Sensory level: T4 Events: CSF return

## 2021-06-14 NOTE — Progress Notes (Signed)
   06/14/21 1618  Assess: MEWS Score  BP (!) 81/46  Pulse Rate (!) 132  Resp 20  SpO2 (!) 89 %  O2 Device Nasal Cannula  O2 Flow Rate (L/min) 2 L/min  Assess: MEWS Score  MEWS Temp 0  MEWS Systolic 1  MEWS Pulse 3  MEWS RR 0  MEWS LOC 0  MEWS Score 4  MEWS Score Color Red  Assess: if the MEWS score is Yellow or Red  Were vital signs taken at a resting state? Yes  Focused Assessment No change from prior assessment  Early Detection of Sepsis Score *See Row Information* Medium  MEWS guidelines implemented *See Row Information* Yes  Take Vital Signs  Increase Vital Sign Frequency  Red: Q 1hr X 4 then Q 4hr X 4, if remains red, continue Q 4hrs  Escalate  MEWS: Escalate Red: discuss with charge nurse/RN and provider, consider discussing with RRT  Document  Progress note created (see row info) Yes

## 2021-06-14 NOTE — Interval H&P Note (Signed)
History and Physical Interval Note:  06/14/2021 7:28 AM  Lauren Avery  has presented today for surgery, with the diagnosis of LEFT HIP FRACTURE.  The various methods of treatment have been discussed with the patient and family. After consideration of risks, benefits and other options for treatment, the patient has consented to  Procedure(s) with comments: INTRAMEDULLARY (IM) NAIL INTERTROCHANTRIC (Left) - ARTHREX GALILEO as a surgical intervention.  The patient's history has been reviewed, patient examined, no change in status, stable for surgery.  I have reviewed the patient's chart and labs.  Questions were answered to the patient's satisfaction.     Fuller Canada

## 2021-06-14 NOTE — Progress Notes (Signed)
   06/14/21 1847  Assess: MEWS Score  Temp 98.8 F (37.1 C)  BP (!) 84/47  Pulse Rate (!) 113  Resp 17  SpO2 99 %  O2 Device Nasal Cannula  Assess: MEWS Score  MEWS Temp 0  MEWS Systolic 1  MEWS Pulse 2  MEWS RR 0  MEWS LOC 2  MEWS Score 5  MEWS Score Color Red  Assess: if the MEWS score is Yellow or Red  Were vital signs taken at a resting state? Yes  Focused Assessment No change from prior assessment  Early Detection of Sepsis Score *See Row Information* Medium  MEWS guidelines implemented *See Row Information* Yes  Take Vital Signs  Increase Vital Sign Frequency  Red: Q 1hr X 4 then Q 4hr X 4, if remains red, continue Q 4hrs  Escalate  MEWS: Escalate Red: discuss with charge nurse/RN and provider, consider discussing with RRT  Notify: Charge Nurse/RN  Name of Charge Nurse/RN Notified Tonna Boehringer, RN  Date Charge Nurse/RN Notified 06/14/21  Time Charge Nurse/RN Notified 1850  Notify: Provider  Provider Name/Title Dr. Mariea Clonts  Date Provider Notified 06/14/21  Time Provider Notified 1850  Notification Type Page  Provider response See new orders  Date of Provider Response 06/14/21  Time of Provider Response 1851  Notify: Rapid Response  Name of Rapid Response RN Notified N/A  Document  Progress note created (see row info) Yes

## 2021-06-14 NOTE — Transfer of Care (Signed)
Immediate Anesthesia Transfer of Care Note  Patient: Lauren Avery  Procedure(s) Performed: INTRAMEDULLARY (IM) NAIL INTERTROCHANTRIC (Left: Hip)  Patient Location: PACU  Anesthesia Type:Spinal  Level of Consciousness: awake, alert , patient cooperative and responds to stimulation  Airway & Oxygen Therapy: Patient Spontanous Breathing and Patient connected to face mask oxygen  Post-op Assessment: Report given to RN, Post -op Vital signs reviewed and stable and Patient moving all extremities X 4  Post vital signs: Reviewed and stable  Last Vitals:  Vitals Value Taken Time  BP 123/62 06/14/21 1000  Temp    Pulse 103 06/14/21 1000  Resp 15 06/14/21 1000  SpO2 96 % 06/14/21 1000  Vitals shown include unvalidated device data.  Last Pain:  Vitals:   06/14/21 0626  TempSrc: Oral  PainSc:          Complications: No notable events documented.

## 2021-06-14 NOTE — Op Note (Signed)
06/14/2021  9:52 AM  PATIENT:  Lauren Avery  87 y.o. female  PRE-OPERATIVE DIAGNOSIS:  LEFT HIP FRACTURE  POST-OPERATIVE DIAGNOSIS:  LEFT HIP FRACTURE 3 part intertrochanteric hip fracture  PROCEDURE:  Procedure(s): INTRAMEDULLARY (IM) NAIL INTERTROCHANTRIC (Left)  Implant Arthrex 125 degree 300 mm nail with 95 lag screw and 36 distal locking screw Galileo nail 10 mm in width  SURGEON:  Surgeon(s) and Role:    * Rahmel Nedved E, MD - Primary  Surgery was done as follows.  The patient was seen in the preop area the left hip was confirmed and marked as the surgical site she was taken the operating for spinal anesthesia Foley catheter was inserted patient was placed supine on the fracture table the right leg which has a partial hip replacement was placed in extension and abduction the left leg was placed in traction over a perineal post  After traction and internal rotation radiographs confirmed adequate reduction there was a posterior fragment which was attached to the greater trochanter but it was a fragment which did not require anatomic reduction  After adequate reduction the leg was prepped and draped sterilely timeout was completed  The incision was made over the trochanter subcutaneous tissue was divided fascia was split in line with skin incision the muscle was divided bluntly the greater trochanter was posterior and the awl was placed into the trochanter of the trochanter was pulled superiorly but it was difficult to get the guide rod from the anterior cortex despite several attempts  I therefore decided to use a shorter nail and although the guide rod went down approximately 330 mm I chose a 300 mm nail to avoid anterior cortex penetration.  We made a second incision for the lag screw the subcutaneous tissue was divided the cannula was advanced down to bone the threaded tip guidewire was advanced into the femoral head and then center center position as close as I can get.   This measured approximately 95 mm.  We set the reamer for 95 and then passed the reamer over the guidewire under fluoroscopy.  The lag screw was advanced x-rays confirmed good positioning.  The internal pistoning screw was removed to allow pistoning of the screw  We then placed the distal screw in standard fashion with a third incision  Final images confirmed adequate placement of all hardware fracture was reduced  All wounds were irrigated proximal wound was closed with #1 Braylon 0 Monocryl 2-0 Monocryl and then 0 Monocryl and 2-0 Monocryl were placed in the second incision and 2-0 Monocryl in the third incision we did inject 20 cc of exparel in the subcutaneous tissue  Sterile bandage was applied  Postop plan Weightbearing as tolerated Follow-up visit in 4 weeks for x-ray DVT prevention for 30 days   PHYSICIAN ASSISTANT:   ASSISTANTS: none   ANESTHESIA:   spinal  EBL:  none   BLOOD ADMINISTERED:none  DRAINS: none   LOCAL MEDICATIONS USED:  OTHER exparel 20 cc full-strength  SPECIMEN:  No Specimen  DISPOSITION OF SPECIMEN:  N/A  COUNTS:  YES  TOURNIQUET:  * No tourniquets in log *  DICTATION: .Dragon Dictation  PLAN OF CARE: Admit to inpatient   PATIENT DISPOSITION:  PACU - hemodynamically stable.   Delay start of Pharmacological VTE agent (>24hrs) due to surgical blood loss or risk of bleeding: yes  

## 2021-06-15 DIAGNOSIS — J189 Pneumonia, unspecified organism: Secondary | ICD-10-CM | POA: Clinically undetermined

## 2021-06-15 DIAGNOSIS — N179 Acute kidney failure, unspecified: Secondary | ICD-10-CM | POA: Clinically undetermined

## 2021-06-15 LAB — BASIC METABOLIC PANEL
Anion gap: 6 (ref 5–15)
BUN: 48 mg/dL — ABNORMAL HIGH (ref 8–23)
CO2: 25 mmol/L (ref 22–32)
Calcium: 8.2 mg/dL — ABNORMAL LOW (ref 8.9–10.3)
Chloride: 108 mmol/L (ref 98–111)
Creatinine, Ser: 1.49 mg/dL — ABNORMAL HIGH (ref 0.44–1.00)
GFR, Estimated: 33 mL/min — ABNORMAL LOW (ref >60)
Glucose, Bld: 238 mg/dL — ABNORMAL HIGH (ref 70–99)
Potassium: 4.8 mmol/L (ref 3.5–5.1)
Sodium: 139 mmol/L (ref 135–145)

## 2021-06-15 LAB — GLUCOSE, CAPILLARY
Glucose-Capillary: 203 mg/dL — ABNORMAL HIGH (ref 70–99)
Glucose-Capillary: 163 mg/dL — ABNORMAL HIGH (ref 70–99)
Glucose-Capillary: 168 mg/dL — ABNORMAL HIGH (ref 70–99)
Glucose-Capillary: 244 mg/dL — ABNORMAL HIGH (ref 70–99)

## 2021-06-15 LAB — CBC
HCT: 25.5 % — ABNORMAL LOW (ref 36.0–46.0)
Hemoglobin: 8 g/dL — ABNORMAL LOW (ref 12.0–15.0)
MCH: 32.4 pg (ref 26.0–34.0)
MCHC: 31.4 g/dL (ref 30.0–36.0)
MCV: 103.2 fL — ABNORMAL HIGH (ref 80.0–100.0)
Platelets: 100 10*3/uL — ABNORMAL LOW (ref 150–400)
RBC: 2.47 MIL/uL — ABNORMAL LOW (ref 3.87–5.11)
RDW: 15.4 % (ref 11.5–15.5)
WBC: 19.1 10*3/uL — ABNORMAL HIGH (ref 4.0–10.5)
nRBC: 0 % (ref 0.0–0.2)

## 2021-06-15 LAB — CORTISOL-AM, BLOOD: Cortisol - AM: 28.8 ug/dL — ABNORMAL HIGH (ref 6.7–22.6)

## 2021-06-15 MED ORDER — SODIUM CHLORIDE 0.9 % IV SOLN: INTRAVENOUS | Status: DC

## 2021-06-15 MED ORDER — SODIUM CHLORIDE 0.9 % IV BOLUS
500.0000 mL | Freq: Once | INTRAVENOUS | Status: AC
  Administered 2021-06-15: 250 mL via INTRAVENOUS

## 2021-06-15 MED ORDER — FENTANYL CITRATE (PF) 100 MCG/2ML IJ SOLN
25.0000 ug | INTRAMUSCULAR | Status: DC | PRN
  Administered 2021-06-15 – 2021-06-16 (×4): 25 ug via INTRAVENOUS
  Filled 2021-06-15 (×6): qty 2

## 2021-06-15 MED ORDER — FENTANYL CITRATE (PF) 100 MCG/2ML IJ SOLN
25.0000 ug | Freq: Once | INTRAMUSCULAR | Status: AC
  Administered 2021-06-15: 25 ug via INTRAVENOUS

## 2021-06-15 MED ORDER — OXYCODONE HCL 5 MG/5ML PO SOLN
5.0000 mg | ORAL | Status: DC | PRN
  Administered 2021-06-15 – 2021-06-17 (×6): 5 mg via ORAL
  Filled 2021-06-15 (×6): qty 5

## 2021-06-15 MED ORDER — ACETAMINOPHEN 650 MG RE SUPP
650.0000 mg | RECTAL | Status: DC | PRN
  Administered 2021-06-15: 650 mg via RECTAL
  Filled 2021-06-15: qty 1

## 2021-06-15 MED ORDER — SENNOSIDES-DOCUSATE SODIUM 8.6-50 MG PO TABS
2.0000 | ORAL_TABLET | Freq: Every day | ORAL | Status: DC
  Administered 2021-06-15 – 2021-06-16 (×2): 2 via ORAL
  Filled 2021-06-15 (×2): qty 2

## 2021-06-15 NOTE — Progress Notes (Signed)
RN called due to patient having a low blood pressure with systolic BP in the 80s, IV NS 979 mL bolus was ordered.  Patient did not respond to the bolus and the RN called back, a second 500 mL bolus at 250 mL/h was given. RN states that patient's BP is still did not respond, she was asked to check the BP manually and it was noted that the BP was 132/52.  I went to bedside and I spoke to the patient and daughter.  Patient was engaged in a conversation,  daughter was also at bedside and patient was able to identify her daughter and was smiling.  She was in no acute distress. Vital signs this morning was BP 140/62, pulse 86/minute, RR 20/minute, temperature 41F and O2 sat was 98% via [place] at 4 LPM (same oxygen requirement since after surgery).  Daughter's concerns were addressed and all questions were answered to satisfaction.  BP 140/62 (BP Location: Right Arm)   Pulse 86   Temp 99 F (37.2 C) (Oral)   Resp 20   Ht 5\' 1"  (1.549 m)   Wt 49 kg   SpO2 98%   BMI 20.41 kg/m   Temp:  [96.2 F (35.7 C)-99 F (37.2 C)] 99 F (37.2 C) (07/16 0536) Pulse Rate:  [82-133] 86 (07/16 0600) Resp:  [11-30] 20 (07/16 0600) BP: (81-150)/(37-75) 140/62 (07/16 0600) SpO2:  [89 %-100 %] 98 % (07/16 0536)  Vitals remained stable throughout the night  12-29-2005, DO

## 2021-06-15 NOTE — Progress Notes (Signed)
Patient very lethargic this morning responds to voice and touch. Opens eyes but not speaking. Pt very drowsy. Holding morning meds until patient more alert for safe administration. Manually checked BP and it was 102/38 MD made aware. No new orders. Will continue to closely monitor patient.

## 2021-06-15 NOTE — Progress Notes (Signed)
   06/15/21 1452  Assess: MEWS Score  BP (!) 146/59  Pulse Rate (!) 112  Assess: MEWS Score  MEWS Temp 0  MEWS Systolic 0  MEWS Pulse 2  MEWS RR 0  MEWS LOC 0  MEWS Score 2  MEWS Score Color Yellow  Notify: Charge Nurse/RN  Name of Charge Nurse/RN Notified Oceanographer  Date Charge Nurse/RN Notified 06/15/21  Time Charge Nurse/RN Notified 1453  Notify: Provider  Provider Name/Title Dr Marisa Severin  Date Provider Notified 06/15/21  Time Provider Notified 1454  Notification Type Face-to-face  Notification Reason Other (Comment) (pain management and HR increased, temp of 100)

## 2021-06-15 NOTE — Progress Notes (Signed)
Patient had had multiple episodes of low blood pressures and was at a red mews on shift report. MD was contacted multiple times in regards to low bp's with several 500 cc boluses given. MD contacted to assess patient. MD agreed to come.  MD stated "she's 14, and it takes them a little longer to bounce back."   Will continue to monitor with manual BP vital.

## 2021-06-15 NOTE — Progress Notes (Addendum)
Patient Demographics:    Lauren Avery, is a 87 y.o. female, DOB - Oct 20, 1927, FHQ:[id number]  Admit date - 06/12/2021   Admitting Physician Lauren Loll, MD  Outpatient Primary MD for the patient is Lauren Hahn, MD  LOS - 3   Chief Complaint  Patient presents with   Fall        Subjective:    Lauren Avery today has no fevers, no emesis,  No chest pain,    -Episodes of hypotension, lethargy and hypoxia post -op  Compounded by IV Dilaudid use--- required multiple fluid boluses--BP improved however BP went soft again after IV Dilaudid - staying somewhat sleepy after IV Dilaudid, does open eyes and talks some -  Assessment  & Plan :    Principal Problem:   Hip fracture (HCC) Active Problems:   AKI (acute kidney injury) (HCC)   Pneumonia--?? Aspiration   IPF (idiopathic pulmonary fibrosis) (HCC)   Diabetes mellitus type 2 in nonobese [facility])  Brief Summary:- 87 y.o. female with medical history significant of hypertension, diabetes, osteoporosis, COPD/pulmonary fibrosis and GERD admitted on 06/13/2019 with left hip fracture after mechanical fall  A/p 1)Lt Hip Fx--orthopedic consult appreciated  S/p  orthopedic fixation on 06/15/2019 -Continue methocarbamol and  -Okay to stop IV Dilaudid due to oversedation and soft BPs -Try low-dose fentanyl IV, will prefer oral opiates once patient is awake enough to safely swallow -Too sleepy, BP too soft and too medically unstable at this time for PT eval  2)HTN-stable, hold  losartan due to soft BP and AKI  3)DM2-hold Levemir as patient is sleepy and not eating well -A1c 7.3 reflecting uncontrolled DM with hyperglycemia PTA Use Novolog/Humalog Sliding scale insulin with Accu-Cheks/Fingersticks as ordered   4) hypotension--- due to dehydration and IV Dilaudid use  -okay to stop IV Dilaudid due to oversedation and soft BPs -Try low-dose fentanyl  IV, will prefer oral opiates once patient is awake enough to safely swallow -Stop losartan -Continue IV fluids until oral intake is more reliable -Given advanced age, soft BP and AKI- --Preferred narcotic agents for pain control are hydromorphone, fentanyl, and methadone. Morphine should Not be used. Baclofen should be avoided  5)AKI----acute kidney injury  --creatinine is up to 1.49 from a baseline of 0.9, creatinine on 06/14/2021 was 1.1--- suspect ATN in the setting of dehydration and hypotension -- , renally adjust medications, avoid nephrotoxic agents / dehydration  / hypotension   Medication Issue:- Preferred narcotic agents for pain control are hydromorphone, fentanyl, and methadone. Morphine should not be used.  Baclofen should be avoided Avoid oral sodium phosphate and magnesium citrate based laxatives / bowel preps    6)Depression/anxiety--- stable, continue Zoloft 25 mg daily   7) acute hypoxic respiratory failure--- patient with underlying COPD/pulmonary fibrosis, postop on 06/14/2021 patient developed worsening hypoxia, tachypnea and tachycardia chest x-ray suggestive of possible right-sided lower lobe pneumonia--cannot rule out aspiration component -Empirically treat with IV Rocephin and azithromycin along with bronchodilators  8) social/ethics--- plan of care discussed with  daughter at bedside, patient is a DNR/DNI -Very very high risk for decompensation of poor outcome given advanced age, comorbid conditions and hip fracture  Disposition/Need for in-Hospital Stay- patient unable to be discharged at this time due to -persistent hypoxia, hypotension  requiring IV fluids IV antibiotics---  Status is: Inpatient  Remains inpatient appropriate because: Please see disposition above  Disposition: The patient is from: Home              Anticipated d/c is to: SNF              Anticipated d/c date is: 2 days              Patient currently is not medically stable to d/c. Barriers: Not  Clinically Stable-   Code Status : -  Code Status: DNR   Family Communication:   Discussed with daughter at bedside Consults  :  ortho  DVT Prophylaxis  :   - SCDs   SCDs Start: 06/14/21 1505 SCDs Start: 06/12/21 1531 Place and maintain sequential compression device Start: 06/12/21 1529   Lab Results  Component Value Date   PLT 100 (L) 06/15/2021    Inpatient Medications  Scheduled Meds:  aspirin EC  325 mg Oral Q breakfast   Chlorhexidine Gluconate Cloth  6 each Topical Daily   fluticasone furoate-vilanterol  1 puff Inhalation Daily   insulin aspart  0-9 Units Subcutaneous TID WC   ipratropium-albuterol  3 mL Nebulization BID   pantoprazole  40 mg Oral Daily   senna-docusate  2 tablet Oral QHS   sertraline  25 mg Oral QHS   Continuous Infusions:  sodium chloride 75 mL/hr at 06/15/21 0839   azithromycin 500 mg (06/14/21 2032)   cefTRIAXone (ROCEPHIN)  IV 1 g (06/14/21 1949)   methocarbamol (ROBAXIN) IV     PRN Meds:.fentaNYL (SUBLIMAZE) injection, HYDROcodone-acetaminophen, labetalol, menthol-cetylpyridinium **OR** phenol, methocarbamol **OR** methocarbamol (ROBAXIN) IV, metoCLOPramide **OR** metoCLOPramide (REGLAN) injection, ondansetron (ZOFRAN) IV, ondansetron **OR** ondansetron (ZOFRAN) IV  Anti-infectives (From admission, onward)    Start     Dose/Rate Route Frequency Ordered Stop   06/14/21 1930  cefTRIAXone (ROCEPHIN) 1 g in sodium chloride 0.9 % 100 mL IVPB        1 g 200 mL/hr over 30 Minutes Intravenous Every 24 hours 06/14/21 1837     06/14/21 1930  azithromycin (ZITHROMAX) 500 mg in sodium chloride 0.9 % 250 mL IVPB        500 mg 250 mL/hr over 60 Minutes Intravenous Every 24 hours 06/14/21 1837     06/14/21 1600  ceFAZolin (ANCEF) IVPB 2g/100 mL premix  Status:  Discontinued        2 g 200 mL/hr over 30 Minutes Intravenous Every 6 hours 06/14/21 1504 06/14/21 1837   06/14/21 0733  ceFAZolin (ANCEF) 2-4 GM/100ML-% IVPB       Note to Pharmacy: Waynard EdwardsMorris,  Barbara   : cabinet override      06/14/21 0733 06/14/21 1944   06/13/21 0600  ceFAZolin (ANCEF) IVPB 2g/100 mL premix  Status:  Discontinued        2 g 200 mL/hr over 30 Minutes Intravenous On call to O.R. 06/12/21 2000 06/14/21 0559         Objective:   Vitals:   06/15/21 0322 06/15/21 0536 06/15/21 0600 06/15/21 0820  BP: (!) 139/58  140/62   Pulse: 82 98 86   Resp: 20 18 20    Temp: 99 F (37.2 C) 99 F (37.2 C)    TempSrc: Axillary Oral    SpO2: 100% 98%  99%  Weight:      Height:        Wt Readings from Last 3 Encounters:  06/12/21 49 kg  04/11/21 49 kg  05/10/19 51.2 kg     Intake/Output Summary (Last 24 hours) at 06/15/2021 1319 Last data filed at 06/15/2021 0900 Gross per 24 hour  Intake 1075 ml  Output --  Net 1075 ml     Physical Exam  Gen:- Sleepy, does open eyes and talks some HEENT:- [place].AT, No sclera icterus Nose- [place] 2L/min Neck-Supple Neck,No JVD,.  Lungs-diminished breath sounds, Velcro type brace noted CV- S1, S2 normal, regular  Abd-  +ve B.Sounds, Abd Soft, No tenderness,    Extremity/Skin:- No  edema, pedal pulses present  Psych-Sleepy, does open eyes and talks some Neuro-generalized weakness, no new focal deficits, no tremors MSK-left hip postop wound is clean dry and intact  Data Review:   Micro Results Recent Results (from the past 240 hour(s))  Surgical PCR screen     Status: None   Collection Time: 06/12/21  8:03 PM   Specimen: Nasal Mucosa; Nasal Swab  Result Value Ref Range Status   MRSA, PCR NEGATIVE NEGATIVE Final   Staphylococcus aureus NEGATIVE NEGATIVE Final    Comment: (NOTE) The Xpert SA Assay (FDA approved for NASAL specimens in patients 47 years of age and older), is one component of a comprehensive surveillance program. It is not intended to diagnose infection nor to guide or monitor treatment. Performed at [facility], [address], [place], [place] [address]     Radiology Reports DG Chest 1 View  Result  Date: 06/12/2021 CLINICAL DATA:  Fall from wheelchair. EXAM: CHEST  1 VIEW COMPARISON:  Radiograph 04/11/2021 FINDINGS: Normal cardiac silhouette. Chronic interstitial lung disease unchanged. No consolidation. No pneumothorax. Rib fracture identified. IMPRESSION: 1. No evidence thoracic trauma. 2. Chronic interstitial lung disease. Electronically Signed   By: Genevive Bi M.D.   On: 06/12/2021 11:27   DG CHEST PORT 1 VIEW  Result Date: 06/14/2021 CLINICAL DATA:  Cough and short of breath EXAM: PORTABLE CHEST 1 VIEW COMPARISON:  06/12/2021, CT 01/25/2019, radiograph 04/11/2021 FINDINGS: Chronic lung disease with diffuse pulmonary fibrosis. No acute superimposed airspace disease on the left. Possible acute airspace disease at the right base. No pleural effusion. Stable cardiomediastinal silhouette. No pneumothorax. IMPRESSION: Chronic lung disease and fibrosis with possible acute mild superimposed airspace disease at the right base. Electronically Signed   By: Jasmine Pang M.D.   On: 06/14/2021 17:16   DG HIP OPERATIVE UNILAT WITH PELVIS LEFT  Result Date: 06/14/2021 CLINICAL DATA:  Status post left hip surgery EXAM: OPERATIVE LEFT HIP (WITH PELVIS IF PERFORMED) 12 VIEWS TECHNIQUE: Fluoroscopic spot image(s) were submitted for interpretation post-operatively. COMPARISON:  None. FINDINGS: Left intertrochanteric fracture transfixed with a intramedullary nail and multiple interlocking screws. Anatomic alignment. Postsurgical changes in the soft tissues overlying the left hip. IMPRESSION: Interval left intertrochanteric fracture ORIF. Electronically Signed   By: Elige Ko   On: 06/14/2021 12:55   DG HIP UNILAT WITH PELVIS 2-3 VIEWS LEFT  Result Date: 06/14/2021 CLINICAL DATA:  Status post ORIF of the proximal left femur EXAM: DG HIP (WITH OR WITHOUT PELVIS) 2-3V LEFT COMPARISON:  06/12/2021 FINDINGS: Postoperative changes within the left femur are noted. There is been interval placement of cannulated  screw and IM nail with reduction of the intertrochanteric fracture of the proximal left femur. Subacute left superior and inferior pubic rami fractures are again noted. Previous right hip arthroplasty. Diffuse osteopenia. IMPRESSION: Status post ORIF of intertrochanteric fracture of the proximal left femur. Electronically Signed   By: Signa Kell M.D.   On: 06/14/2021 13:39   DG HIP  UNILAT W OR W/O PELVIS 2-3 VIEWS LEFT  Result Date: 06/12/2021 CLINICAL DATA:  Left hip pain EXAM: DG HIP (WITH OR WITHOUT PELVIS) 2-3V LEFT COMPARISON:  04/11/2021 FINDINGS: Acute, comminuted intertrochanteric fracture of the proximal left femur. Slight anterior apex angulation. Lesser trochanteric fragment displaced medially by approximately 8 mm. Greater tuberosity does not appear significantly displaced. Diffuse bony demineralization. Remote left superior and inferior pubic rami fractures. Prior left hip arthroplasty. Extensive vascular calcifications. IMPRESSION: Acute, comminuted intertrochanteric fracture of the proximal left femur. Electronically Signed   By: Duanne Guess D.O.   On: 06/12/2021 11:25     CBC Recent Labs  Lab 06/12/21 0954 06/13/21 0557 06/15/21 0447  WBC 7.6 13.5* 19.1*  HGB 14.6 10.4* 8.0*  HCT 45.3 33.6* 25.5*  PLT 108* 117* 100*  MCV 97.0 103.1* 103.2*  MCH 31.3 31.9 32.4  MCHC 32.2 31.0 31.4  RDW 15.8* 15.9* 15.4    Chemistries  Recent Labs  Lab 06/12/21 0954 06/13/21 0557 06/14/21 0536 06/15/21 0447  NA 135 138 138 139  K 4.2 5.2* 4.6 4.8  CL 103 106 107 108  CO2 23 26 26 25   GLUCOSE 276* 291* 215* 238*  BUN 16 29* 38* 48*  CREATININE 0.92 1.14* 1.16* 1.49*  CALCIUM 9.3 8.8* 8.6* 8.2*   ------------------------------------------------------------------------------------------------------------------ No results for input(s): CHOL, HDL, LDLCALC, TRIG, CHOLHDL, LDLDIRECT in the last 72 hours.  Lab Results  Component Value Date   HGBA1C 7.3 (H) 06/12/2021    ------------------------------------------------------------------------------------------------------------------ No results for input(s): TSH, T4TOTAL, T3FREE, THYROIDAB in the last 72 hours.  Invalid input(s): FREET3 ------------------------------------------------------------------------------------------------------------------ No results for input(s): VITAMINB12, FOLATE, FERRITIN, TIBC, IRON, RETICCTPCT in the last 72 hours.  Coagulation profile No results for input(s): INR, PROTIME in the last 168 hours.  No results for input(s): DDIMER in the last 72 hours.  Cardiac Enzymes No results for input(s): CKMB, TROPONINI, MYOGLOBIN in the last 168 hours.  Invalid input(s): CK ------------------------------------------------------------------------------------------------------------------ No results found for: BNP   06/14/2021 M.D on 06/15/2021 at 1:19 PM  Go to [email or web address] - for contact info  [facility] - Office  [phone]

## 2021-06-15 NOTE — Evaluation (Signed)
Clinical/Bedside Swallow Evaluation Patient Details  Name: Lauren Avery MRN: [id number] Date of Birth: Jun 11, 1927  Today's Date: 06/15/2021 Time: SLP Start Time (ACUTE ONLY): 1722 SLP Stop Time (ACUTE ONLY): 1749 SLP Time Calculation (min) (ACUTE ONLY): 27 min  Past Medical History:  Past Medical History:  Diagnosis Date   Diabetes mellitus    Fracture 10/2012   left wrist   Fracture of left clavicle    12/12 wore a sling   GERD (gastroesophageal reflux disease)    Hypertension    Osteoporosis    Pulmonary fibrosis (HCC)    Past Surgical History:  Past Surgical History:  Procedure Laterality Date   FEMUR FRACTURE SURGERY      6/08 partial hip replacement right   ganglion cyst removal     left wrist 1960   HIP FRACTURE SURGERY     1984 pin and plate placed right   HIP SURGERY     pin and plate removed 05/736   ORIF ELBOW FRACTURE Right 02/18/2013   Procedure: OPEN REDUCTION INTERNAL FIXATION (ORIF) ELBOW/OLECRANON FRACTURE;  Surgeon: Nadara Mustard, MD;  Location: [facility] OR;  Service: Orthopedics;  Laterality: Right;   ORIF ELBOW FRACTURE Left 05/09/2015   Procedure: OPEN REDUCTION INTERNAL FIXATION (ORIF) LEFT MEDIAL HUMERAL CONDYLE FRACTURE;  Surgeon: Tarry Kos, MD;  Location: [facility] OR;  Service: Orthopedics;  Laterality: Left;   HPI:  87 y.o. female with medical history significant of hypertension, diabetes, osteoporosis, COPD/pulmonary fibrosis and GERD admitted on 06/13/2019 with left hip fracture after mechanical fall. S/p orthopedic fixation on 06/15/2019. Pt with acute hypoxic respiratory failure--- patient with underlying COPD/pulmonary fibrosis, postop on 06/14/2021 patient developed worsening hypoxia, tachypnea and tachycardia chest x-ray suggestive of possible right-sided lower lobe pneumonia--cannot rule out aspiration component. BSE ordered. Pt had MBSS 04/12/2021 with recommendation for regular textures and thin liquids with use of a chin tuck.   Assessment / Plan /  Recommendation Clinical Impression  Clinical swallow evaluation completed at bedside in room with daughter present. Pt had MBSS at [facility] in May with recommendation for regular textures and thin liquids witha chin tuck. Pt was still adhering to chin tuck prior to admission, however daughter indicates reduced intake of liquids for the past week. Pt reportedly more alert now than she was earlier today. She required repositioning to help keep her head upright and cues to look down at her hands. SLP provided hand over hand assist for self feeding of cup sips water, NTL, and puree. Pt with occasional oral holding, suspected delay in swallow initiation, and occasional throat clearing and delayed dry cough. She accepted her liquid pain medication and then started hiccupping and expectorating saliva (her daughter indicates expectoration is her baseline for years). Pt with improved oral control with NTL. Continue diet of D1/puree and NTL and SLP to follow in acute stay for diet advancement as appropriate. OK for po medications whole in puree or with NTL. Pt and nursing in agreement with plan of care. SLP Visit Diagnosis: Dysphagia, unspecified (R13.10)    Aspiration Risk  Mild aspiration risk    Diet Recommendation Dysphagia 1 (Puree);Nectar-thick liquid   Liquid Administration via: Cup;No straw Medication Administration: Whole meds with puree Supervision: Staff to assist with self feeding;Full supervision/cueing for compensatory strategies Compensations: Slow rate;Small sips/bites Postural Changes: Seated upright at 90 degrees;Remain upright for at least 30 minutes after po intake    Other  Recommendations Oral Care Recommendations: Oral care BID;Staff/trained caregiver to provide oral care Other Recommendations: Clarify  dietary restrictions;Order thickener from pharmacy;Prohibited food (jello, ice cream, thin soups) (Ok for ice chips after oral care presented by RN)   Follow up Recommendations 24 hour  supervision/assistance      Frequency and Duration min 2x/week  1 week       Prognosis Prognosis for Safe Diet Advancement: Good Barriers to Reach Goals: Time post onset      Swallow Study   General Date of Onset: 06/12/21 HPI: 87 y.o. female with medical history significant of hypertension, diabetes, osteoporosis, COPD/pulmonary fibrosis and GERD admitted on 06/13/2019 with left hip fracture after mechanical fall. S/p orthopedic fixation on 06/15/2019. Pt with acute hypoxic respiratory failure--- patient with underlying COPD/pulmonary fibrosis, postop on 06/14/2021 patient developed worsening hypoxia, tachypnea and tachycardia chest x-ray suggestive of possible right-sided lower lobe pneumonia--cannot rule out aspiration component. BSE ordered. Pt had MBSS 04/12/2021 with recommendation for regular textures and thin liquids with use of a chin tuck. Type of Study: Bedside Swallow Evaluation Previous Swallow Assessment: MBSS 04/12/21 Diet Prior to this Study: Dysphagia 1 (puree);Nectar-thick liquids Temperature Spikes Noted: No Respiratory Status: Nasal cannula History of Recent Intubation: No Behavior/Cognition: Alert;Cooperative;Pleasant mood;Requires cueing Oral Cavity Assessment: Within Functional Limits Oral Care Completed by SLP: Recent completion by staff Oral Cavity - Dentition: Adequate natural dentition Vision: Functional for self-feeding Self-Feeding Abilities: Needs set up;Needs assist Patient Positioning: Upright in bed Baseline Vocal Quality: Normal;Hoarse Volitional Cough: Strong Volitional Swallow: Able to elicit    Oral/Motor/Sensory Function Overall Oral Motor/Sensory Function: Within functional limits   Ice Chips Ice chips: Within functional limits Presentation: Spoon   Thin Liquid Thin Liquid: Impaired Presentation: Cup;Self Fed;Spoon Oral Phase Impairments: Reduced lingual movement/coordination (occasional lingual thrust and expectoration) Oral Phase Functional  Implications: Prolonged oral transit;Oral holding Pharyngeal  Phase Impairments: Suspected delayed Swallow;Cough - Delayed;Throat Clearing - Delayed    Nectar Thick Nectar Thick Liquid: Impaired Presentation: Cup Oral Phase Impairments: Reduced lingual movement/coordination Oral phase functional implications: Oral holding Pharyngeal Phase Impairments: Throat Clearing - Delayed   Honey Thick Honey Thick Liquid: Not tested   Puree Puree: Within functional limits Presentation: Spoon;Self Fed (hand over hand assist)   Solid     Solid: Not tested     Thank you,  Havery Moros, CCC-SLP [phone]  Lauren Avery 06/15/2021,5:57 PM

## 2021-06-15 NOTE — Progress Notes (Signed)
   06/15/21 1707  Vitals  BP (!) 149/56  Pulse Rate (!) 113  MEWS COLOR  MEWS Score Color Yellow  MEWS Score  MEWS Temp 0  MEWS Systolic 0  MEWS Pulse 2  MEWS RR 0  MEWS LOC 0  MEWS Score 2  Provider Notification  Provider Name/Title Dr Marisa Severin  Date Provider Notified 06/15/21  Time Provider Notified 1733  Notification Type Face-to-face  Notification Reason Other (Comment) (BP and HR)  Provider response No new orders  Date of Provider Response 06/15/21  Time of Provider Response 1733

## 2021-06-15 NOTE — Progress Notes (Signed)
Subjective: 1 Day Post-Op Procedure(s) (LRB): INTRAMEDULLARY (IM) NAIL INTERTROCHANTRIC (Left) Patient reports pain as  confused .    Objective: Vital signs in last 24 hours:BP 140/62 (BP Location: Right Arm)   Pulse 86   Temp 99 F (37.2 C) (Oral)   Resp 20   Ht 5\' 1"  (1.549 m)   Wt 49 kg   SpO2 99%   BMI 20.41 kg/m   Intake/Output from previous day: 07/15 0701 - 07/16 0700 In: 2625 [I.V.:1500; IV Piggyback:1125] Out: 450 [Urine:400; Blood:50] Intake/Output this shift: No intake/output data recorded.  Recent Labs    06/13/21 0557 06/15/21 0447  HGB 10.4* 8.0*   Recent Labs    06/13/21 0557 06/15/21 0447  WBC 13.5* 19.1*  RBC 3.26* 2.47*  HCT 33.6* 25.5*  PLT 117* 100*   Recent Labs    06/14/21 0536 06/15/21 0447  NA 138 139  K 4.6 4.8  CL 107 108  CO2 26 25  BUN 38* 48*  CREATININE 1.16* 1.49*  GLUCOSE 215* 238*  CALCIUM 8.6* 8.2*   No results for input(s): LABPT, INR in the last 72 hours.  Incision: scant drainage   Assessment/Plan: 1 Day Post-Op Procedure(s) (LRB): INTRAMEDULLARY (IM) NAIL INTERTROCHANTRIC (Left)  Attempt PT if possible but seems too sedate       06/17/21 06/15/2021, 10:03 AM

## 2021-06-16 ENCOUNTER — Inpatient Hospital Stay (HOSPITAL_COMMUNITY): Payer: Medicare Other

## 2021-06-16 DIAGNOSIS — E119 Type 2 diabetes mellitus without complications: Secondary | ICD-10-CM

## 2021-06-16 DIAGNOSIS — J84112 Idiopathic pulmonary fibrosis: Secondary | ICD-10-CM

## 2021-06-16 LAB — CBC
HCT: 25.4 % — ABNORMAL LOW (ref 36.0–46.0)
Hemoglobin: 7.9 g/dL — ABNORMAL LOW (ref 12.0–15.0)
MCH: 32.1 pg (ref 26.0–34.0)
MCHC: 31.1 g/dL (ref 30.0–36.0)
MCV: 103.3 fL — ABNORMAL HIGH (ref 80.0–100.0)
Platelets: 92 10*3/uL — ABNORMAL LOW (ref 150–400)
RBC: 2.46 MIL/uL — ABNORMAL LOW (ref 3.87–5.11)
RDW: 15.6 % — ABNORMAL HIGH (ref 11.5–15.5)
WBC: 12 10*3/uL — ABNORMAL HIGH (ref 4.0–10.5)
nRBC: 0 % (ref 0.0–0.2)

## 2021-06-16 LAB — GLUCOSE, CAPILLARY
Glucose-Capillary: 215 mg/dL — ABNORMAL HIGH (ref 70–99)
Glucose-Capillary: 247 mg/dL — ABNORMAL HIGH (ref 70–99)
Glucose-Capillary: 325 mg/dL — ABNORMAL HIGH (ref 70–99)
Glucose-Capillary: 170 mg/dL — ABNORMAL HIGH (ref 70–99)

## 2021-06-16 LAB — BASIC METABOLIC PANEL
Anion gap: 4 — ABNORMAL LOW (ref 5–15)
BUN: 47 mg/dL — ABNORMAL HIGH (ref 8–23)
CO2: 26 mmol/L (ref 22–32)
Calcium: 8.3 mg/dL — ABNORMAL LOW (ref 8.9–10.3)
Chloride: 110 mmol/L (ref 98–111)
Creatinine, Ser: 0.98 mg/dL (ref 0.44–1.00)
GFR, Estimated: 54 mL/min — ABNORMAL LOW (ref >60)
Glucose, Bld: 253 mg/dL — ABNORMAL HIGH (ref 70–99)
Potassium: 4.1 mmol/L (ref 3.5–5.1)
Sodium: 140 mmol/L (ref 135–145)

## 2021-06-16 MED ORDER — FUROSEMIDE 10 MG/ML IJ SOLN
20.0000 mg | Freq: Once | INTRAMUSCULAR | Status: AC
  Administered 2021-06-16: 20 mg via INTRAVENOUS
  Filled 2021-06-16: qty 2

## 2021-06-16 MED ORDER — AMLODIPINE BESYLATE 5 MG PO TABS
2.5000 mg | ORAL_TABLET | Freq: Every day | ORAL | Status: DC
  Administered 2021-06-17: 2.5 mg via ORAL
  Filled 2021-06-16: qty 1

## 2021-06-16 NOTE — Progress Notes (Signed)
Patient Demographics:    Lauren Avery, is a 87 y.o. female, DOB - Oct 15, 1927, GYF:[id number]  Admit date - 06/12/2021   Admitting Physician Vassie Loll, MD  Outpatient Primary MD for the patient is Georgiann Hahn, MD  LOS - 4   Chief Complaint  Patient presents with   Fall        Subjective:    [name] today has no fevers, no emesis,  No chest pain,    Both daughters at bedside, more awake, oral intake improving  Assessment  & Plan :    Principal Problem:   Hip fracture (HCC) Active Problems:   AKI (acute kidney injury) (HCC)   Pneumonia--?? Aspiration   IPF (idiopathic pulmonary fibrosis) (HCC)   Diabetes mellitus type 2 in nonobese [facility])  Brief Summary:- 87 y.o. female with medical history significant of hypertension, diabetes, osteoporosis, COPD/pulmonary fibrosis and GERD admitted on 06/13/2019 with left hip fracture after mechanical fall  A/p 1)Lt Hip Fx--orthopedic consult appreciated  S/p  orthopedic fixation on 06/15/2019 -Continue methocarbamol and  -Okay to stop IV Dilaudid due to oversedation and soft BPs -Doing better with IV fentanyl alternating with p.o. oxycodone -Physical therapy eval noted, recommends SNF rehab  2)HTN-stable, continue to hold  losartan due to soft BP and AKI  3)DM2-hold Levemir as patient is sleepy and not eating well -A1c 7.3 reflecting uncontrolled DM with hyperglycemia PTA Use Novolog/Humalog Sliding scale insulin with Accu-Cheks/Fingersticks as ordered   4) hypotension--- due to dehydration and IV Dilaudid use  -okay to stop IV Dilaudid due to oversedation and soft BPs -BP improved with discontinuation of Dilaudid and IV fluids -Okay to stop IV fluids  5)AKI----acute kidney injury  --creatinine is down to 0.9 from 1.49 from a baseline of 0.9, creatinine on 06/14/2021 was 1.1--- suspect ATN in the setting of dehydration and  hypotension--- -Renal function improving with improvement in BP and hydration -- , renally adjust medications, avoid nephrotoxic agents / dehydration  / hypotension   Medication Issue:- Preferred narcotic agents for pain control are hydromorphone, fentanyl, and methadone. Morphine should not be used.  Baclofen should be avoided Avoid oral sodium phosphate and magnesium citrate based laxatives / bowel preps    6)Depression/anxiety--- stable, continue Zoloft 25 mg daily   7) acute hypoxic respiratory failure--- patient with underlying COPD/pulmonary fibrosis, postop on 06/14/2021 patient developed worsening hypoxia, tachypnea and tachycardia chest x-ray suggestive of possible right-sided lower lobe pneumonia--cannot rule out aspiration component -Continue IV Rocephin and azithromycin along with bronchodilators -Repeat chest x-ray on 06/16/2021 shows underlying pulmonary fibrosis and improving right-sided pneumonia  8) social/ethics--- plan of care discussed with  daughter at bedside, patient is a DNR/DNI -Very very high risk for decompensation of poor outcome given advanced age, comorbid conditions and hip fracture  Disposition/Need for in-Hospital Stay- patient unable to be discharged at this time due to -persistent hypoxia, hypotension requiring IV fluids IV antibiotics---  Status is: Inpatient  Remains inpatient appropriate because: Please see disposition above  Disposition: The patient is from: Home              Anticipated d/c is to: SNF              Anticipated d/c date is: 1 day  Patient currently is not medically stable to d/c. Barriers: Not Clinically Stable-   Code Status : -  Code Status: DNR   Family Communication:   Discussed with daughter at bedside Consults  :  ortho  DVT Prophylaxis  :   - SCDs   SCDs Start: 06/14/21 1505 SCDs Start: 06/12/21 1531 Place and maintain sequential compression device Start: 06/12/21 1529   Lab Results  Component Value Date    PLT 92 (L) 06/16/2021    Inpatient Medications  Scheduled Meds:  aspirin EC  325 mg Oral Q breakfast   Chlorhexidine Gluconate Cloth  6 each Topical Daily   fluticasone furoate-vilanterol  1 puff Inhalation Daily   furosemide  20 mg Intravenous Once   insulin aspart  0-9 Units Subcutaneous TID WC   ipratropium-albuterol  3 mL Nebulization BID   pantoprazole  40 mg Oral Daily   senna-docusate  2 tablet Oral QHS   sertraline  25 mg Oral QHS   Continuous Infusions:  azithromycin 500 mg (06/15/21 2127)   cefTRIAXone (ROCEPHIN)  IV 1 g (06/15/21 2045)   methocarbamol (ROBAXIN) IV     PRN Meds:.acetaminophen, fentaNYL (SUBLIMAZE) injection, labetalol, menthol-cetylpyridinium **OR** phenol, methocarbamol **OR** methocarbamol (ROBAXIN) IV, metoCLOPramide **OR** metoCLOPramide (REGLAN) injection, ondansetron (ZOFRAN) IV, ondansetron **OR** ondansetron (ZOFRAN) IV, oxyCODONE  Anti-infectives (From admission, onward)    Start     Dose/Rate Route Frequency Ordered Stop   06/14/21 1930  cefTRIAXone (ROCEPHIN) 1 g in sodium chloride 0.9 % 100 mL IVPB        1 g 200 mL/hr over 30 Minutes Intravenous Every 24 hours 06/14/21 1837     06/14/21 1930  azithromycin (ZITHROMAX) 500 mg in sodium chloride 0.9 % 250 mL IVPB        500 mg 250 mL/hr over 60 Minutes Intravenous Every 24 hours 06/14/21 1837     06/14/21 1600  ceFAZolin (ANCEF) IVPB 2g/100 mL premix  Status:  Discontinued        2 g 200 mL/hr over 30 Minutes Intravenous Every 6 hours 06/14/21 1504 06/14/21 1837   06/14/21 0733  ceFAZolin (ANCEF) 2-4 GM/100ML-% IVPB       Note to Pharmacy: Waynard Edwards   : cabinet override      06/14/21 0733 06/14/21 1944   06/13/21 0600  ceFAZolin (ANCEF) IVPB 2g/100 mL premix  Status:  Discontinued        2 g 200 mL/hr over 30 Minutes Intravenous On call to O.R. 06/12/21 2000 06/14/21 0559         Objective:   Vitals:   06/15/21 2218 06/16/21 0116 06/16/21 0814 06/16/21 1412  BP: (!) 128/53  (!) 160/73  (!) 156/61  Pulse: (!) 112 (!) 109  (!) 110  Resp: 16 16  20   Temp: 98.1 F (36.7 C) 98.4 F (36.9 C)  97.7 F (36.5 C)  TempSrc: Oral Oral  Oral  SpO2: 100% 100% 99% 100%  Weight:      Height:        Wt Readings from Last 3 Encounters:  06/12/21 49 kg  04/11/21 49 kg  05/10/19 51.2 kg     Intake/Output Summary (Last 24 hours) at 06/16/2021 1555 Last data filed at 06/16/2021 1300 Gross per 24 hour  Intake 1219.67 ml  Output 1150 ml  Net 69.67 ml     Physical Exam  Gen:-Awake, in no acute distress HEENT:- [place].AT, No sclera icterus Nose- [place] 2L/min Neck-Supple Neck,No JVD,.  Lungs-diminished breath sounds, Velcro type  brace noted CV- S1, S2 normal, regular  Abd-  +ve B.Sounds, Abd Soft, No tenderness,    Extremity/Skin:- No  edema, pedal pulses present  Psych-affect is appropriate, occasional episodes of confusion and disorientation  neuro-generalized weakness, no new focal deficits, no tremors MSK-left hip postop wound is clean dry and intact  Data Review:   Micro Results Recent Results (from the past 240 hour(s))  Surgical PCR screen     Status: None   Collection Time: 06/12/21  8:03 PM   Specimen: Nasal Mucosa; Nasal Swab  Result Value Ref Range Status   MRSA, PCR NEGATIVE NEGATIVE Final   Staphylococcus aureus NEGATIVE NEGATIVE Final    Comment: (NOTE) The Xpert SA Assay (FDA approved for NASAL specimens in patients 85 years of age and older), is one component of a comprehensive surveillance program. It is not intended to diagnose infection nor to guide or monitor treatment. Performed at [facility], [address] [place], [place] [address]     Radiology Reports DG Chest 1 View  Result Date: 06/12/2021 CLINICAL DATA:  Fall from wheelchair. EXAM: CHEST  1 VIEW COMPARISON:  Radiograph 04/11/2021 FINDINGS: Normal cardiac silhouette. Chronic interstitial lung disease unchanged. No consolidation. No pneumothorax. Rib fracture identified.  IMPRESSION: 1. No evidence thoracic trauma. 2. Chronic interstitial lung disease. Electronically Signed   By: Genevive BiStewart  Edmunds M.D.   On: 06/12/2021 11:27   DG CHEST PORT 1 VIEW  Result Date: 06/16/2021 CLINICAL DATA:  History of pulmonary fibrosis. Respiratory abnormality. Shortness of breath. EXAM: PORTABLE CHEST 1 VIEW COMPARISON:  June 14, 2021 and June 12, 2021. FINDINGS: Findings of pulmonary fibrosis identified. The suspected possible acute opacity over the right base persists but is improved. No pneumothorax. No change in the cardiomediastinal silhouette. IMPRESSION: 1. Findings of fibrosis. 2. Persistent but improving superimposed right basilar opacity. 3. No other change. Electronically Signed   By: Gerome Samavid  Williams III M.D   On: 06/16/2021 15:16   DG CHEST PORT 1 VIEW  Result Date: 06/14/2021 CLINICAL DATA:  Cough and short of breath EXAM: PORTABLE CHEST 1 VIEW COMPARISON:  06/12/2021, CT 01/25/2019, radiograph 04/11/2021 FINDINGS: Chronic lung disease with diffuse pulmonary fibrosis. No acute superimposed airspace disease on the left. Possible acute airspace disease at the right base. No pleural effusion. Stable cardiomediastinal silhouette. No pneumothorax. IMPRESSION: Chronic lung disease and fibrosis with possible acute mild superimposed airspace disease at the right base. Electronically Signed   By: Jasmine PangKim  Fujinaga M.D.   On: 06/14/2021 17:16   DG HIP OPERATIVE UNILAT WITH PELVIS LEFT  Result Date: 06/14/2021 CLINICAL DATA:  Status post left hip surgery EXAM: OPERATIVE LEFT HIP (WITH PELVIS IF PERFORMED) 12 VIEWS TECHNIQUE: Fluoroscopic spot image(s) were submitted for interpretation post-operatively. COMPARISON:  None. FINDINGS: Left intertrochanteric fracture transfixed with a intramedullary nail and multiple interlocking screws. Anatomic alignment. Postsurgical changes in the soft tissues overlying the left hip. IMPRESSION: Interval left intertrochanteric fracture ORIF. Electronically  Signed   By: Elige KoHetal  Patel   On: 06/14/2021 12:55   DG HIP UNILAT WITH PELVIS 2-3 VIEWS LEFT  Result Date: 06/14/2021 CLINICAL DATA:  Status post ORIF of the proximal left femur EXAM: DG HIP (WITH OR WITHOUT PELVIS) 2-3V LEFT COMPARISON:  06/12/2021 FINDINGS: Postoperative changes within the left femur are noted. There is been interval placement of cannulated screw and IM nail with reduction of the intertrochanteric fracture of the proximal left femur. Subacute left superior and inferior pubic rami fractures are again noted. Previous right hip arthroplasty. Diffuse  osteopenia. IMPRESSION: Status post ORIF of intertrochanteric fracture of the proximal left femur. Electronically Signed   By: Signa Kell M.D.   On: 06/14/2021 13:39   DG HIP UNILAT W OR W/O PELVIS 2-3 VIEWS LEFT  Result Date: 06/12/2021 CLINICAL DATA:  Left hip pain EXAM: DG HIP (WITH OR WITHOUT PELVIS) 2-3V LEFT COMPARISON:  04/11/2021 FINDINGS: Acute, comminuted intertrochanteric fracture of the proximal left femur. Slight anterior apex angulation. Lesser trochanteric fragment displaced medially by approximately 8 mm. Greater tuberosity does not appear significantly displaced. Diffuse bony demineralization. Remote left superior and inferior pubic rami fractures. Prior left hip arthroplasty. Extensive vascular calcifications. IMPRESSION: Acute, comminuted intertrochanteric fracture of the proximal left femur. Electronically Signed   By: Duanne Guess D.O.   On: 06/12/2021 11:25     CBC Recent Labs  Lab 06/12/21 0954 06/13/21 0557 06/15/21 0447 06/16/21 0151  WBC 7.6 13.5* 19.1* 12.0*  HGB 14.6 10.4* 8.0* 7.9*  HCT 45.3 33.6* 25.5* 25.4*  PLT 108* 117* 100* 92*  MCV 97.0 103.1* 103.2* 103.3*  MCH 31.3 31.9 32.4 32.1  MCHC 32.2 31.0 31.4 31.1  RDW 15.8* 15.9* 15.4 15.6*    Chemistries  Recent Labs  Lab 06/12/21 0954 06/13/21 0557 06/14/21 0536 06/15/21 0447 06/16/21 0151  NA 135 138 138 139 140  K 4.2 5.2* 4.6 4.8  4.1  CL 103 106 107 108 110  CO2 23 26 26 25 26   GLUCOSE 276* 291* 215* 238* 253*  BUN 16 29* 38* 48* 47*  CREATININE 0.92 1.14* 1.16* 1.49* 0.98  CALCIUM 9.3 8.8* 8.6* 8.2* 8.3*   ------------------------------------------------------------------------------------------------------------------ No results for input(s): CHOL, HDL, LDLCALC, TRIG, CHOLHDL, LDLDIRECT in the last 72 hours.  Lab Results  Component Value Date   HGBA1C 7.3 (H) 06/12/2021   ------------------------------------------------------------------------------------------------------------------ No results for input(s): TSH, T4TOTAL, T3FREE, THYROIDAB in the last 72 hours.  Invalid input(s): FREET3 ------------------------------------------------------------------------------------------------------------------ No results for input(s): VITAMINB12, FOLATE, FERRITIN, TIBC, IRON, RETICCTPCT in the last 72 hours.  Coagulation profile No results for input(s): INR, PROTIME in the last 168 hours.  No results for input(s): DDIMER in the last 72 hours.  Cardiac Enzymes No results for input(s): CKMB, TROPONINI, MYOGLOBIN in the last 168 hours.  Invalid input(s): CK ------------------------------------------------------------------------------------------------------------------ No results found for: BNP   06/14/2021 M.D on 06/16/2021 at 3:55 PM  Go to [email or web address] - for contact info  [facility] - Office  [phone]

## 2021-06-16 NOTE — Progress Notes (Signed)
Subjective: 2 Days Post-Op Procedure(s) (LRB): INTRAMEDULLARY (IM) NAIL INTERTROCHANTRIC (Left) Patient reports pain as  can not assess .    Objective: Vital signs in last 24 hours: Temp:  [97.7 F (36.5 C)-98.4 F (36.9 C)] 97.7 F (36.5 C) (07/17 1412) Pulse Rate:  [109-119] 119 (07/17 1937) Resp:  [16-20] 18 (07/17 1937) BP: (128-160)/(53-73) 156/61 (07/17 1412) SpO2:  [98 %-100 %] 98 % (07/17 1937)  Intake/Output from previous day: 07/16 0701 - 07/17 0700 In: 1538.7 [P.O.:510; I.V.:1028.7] Out: 1150 [Urine:1150] Intake/Output this shift: Total I/O In: 50 [P.O.:50] Out: 1500 [Urine:1500]  Recent Labs    06/15/21 0447 06/16/21 0151  HGB 8.0* 7.9*   Recent Labs    06/15/21 0447 06/16/21 0151  WBC 19.1* 12.0*  RBC 2.47* 2.46*  HCT 25.5* 25.4*  PLT 100* 92*   Recent Labs    06/15/21 0447 06/16/21 0151  NA 139 140  K 4.8 4.1  CL 108 110  CO2 25 26  BUN 48* 47*  CREATININE 1.49* 0.98  GLUCOSE 238* 253*  CALCIUM 8.2* 8.3*   No results for input(s): LABPT, INR in the last 72 hours.  Sensation intact distally Intact pulses distally Dorsiflexion/Plantar flexion intact Incision: scant drainage   Assessment/Plan: 2 Days Post-Op Procedure(s) (LRB): INTRAMEDULLARY (IM) NAIL INTERTROCHANTRIC (Left) Advance diet Up with therapy D/C IV fluids    Fuller Canada 06/16/2021, 10:13 PM

## 2021-06-16 NOTE — TOC Progression Note (Signed)
Transition of Care [facility]) - Progression Note    Patient Details  Name: Lauren Avery MRN: [id number] Date of Birth: November 01, 1927  [facility]) CM/SW Contact  Barry Brunner, LCSW Phone Number: 06/16/2021, 5:32 PM  Clinical Narrative:    CSW contacted patient's daughter Rudene Re to discuss bed offers. Patient's daughter requested to wait for [facility] to review patient Monday. CSW started [place]. Facility to be added. TOC to follow.   Expected Discharge Plan: Skilled Nursing Facility Barriers to Discharge: Continued Medical Work up  Expected Discharge Plan and Services Expected Discharge Plan: Skilled Nursing Facility In-house Referral: Clinical Social Work     Living arrangements for the past 2 months: Assisted Living Facility                                       Social Determinants of Health (SDOH) Interventions    Readmission Risk Interventions No flowsheet data found.

## 2021-06-16 NOTE — Plan of Care (Signed)
  Problem: Acute Rehab PT Goals(only PT should resolve) Goal: Pt Will Go Supine/Side To Sit Flowsheets (Taken 06/16/2021 1106) Pt will go Supine/Side to Sit: with moderate assist Goal: Pt Will Go Sit To Supine/Side Flowsheets (Taken 06/16/2021 1106) Pt will go Sit to Supine/Side: with moderate assist Goal: Pt Will Transfer Bed To Chair/Chair To Bed Flowsheets (Taken 06/16/2021 1106) Pt will Transfer Bed to Chair/Chair to Bed: with mod assist Goal: Pt Will Ambulate Flowsheets (Taken 06/16/2021 1106) Pt will Ambulate:  15 feet  with moderate assist  with rolling walker

## 2021-06-16 NOTE — Evaluation (Signed)
Physical Therapy Evaluation Patient Details Name: Lauren Avery MRN: [id number] DOB: 11-26-1927 Today's Date: 06/16/2021   History of Present Illness  Lauren Avery is a 87 y.o. female with medical history significant of hypertension, diabetes, osteoporosis, COPD/pulmonary fibrosis and GERD; who presented to the hospital secondary to mechanical fall and left hip pain.  Patient with limited mobility at baseline but able to transfer out of her wheelchair and walk short distances.  Reports that in the way to the bathroom this morning she ended up falling out of the wheelchair landing on her left hip and experiencing excruciating pain. Pt sustained a Lt hip fx and recieved an IM nailing. Pt is WBAT  Clinical Impression  Pt able to participate in treatment but fatigue quickly.     Follow Up Recommendations SNF    Equipment Recommendations  None recommended by PT    Recommendations for Other Services OT consult     Precautions / Restrictions Restrictions Weight Bearing Restrictions: Yes LLE Weight Bearing: Non weight bearing      Mobility  Bed Mobility Overal bed mobility: Needs Assistance Bed Mobility: Sit to Supine       Sit to supine: Max assist        Transfers Overall transfer level: Modified independent Equipment used: None             General transfer comment: sit to stand x3 for activity tolerance and strength  Ambulation/Gait Ambulation/Gait assistance: Max assist Gait Distance (Feet): 4 Feet   Gait Pattern/deviations: Trunk flexed     General Gait Details: side stepping to head of bed  Stairs            Wheelchair Mobility    Modified Rankin (Stroke Patients Only)       Balance                                             Pertinent Vitals/Pain Pain Assessment: Faces Faces Pain Scale: Hurts even more Pain Location: when moving; when still pt appears comfortable. Pain Descriptors / Indicators:  Throbbing Pain Intervention(s): Limited activity within patient's tolerance    Home Living Family/patient expects to be discharged to:: Skilled nursing facility               Home Equipment: Bedside commode;Walker - 2 wheels;Walker - 4 wheels;Shower seat;Other (comment) Additional Comments: hospital bed    Prior Function Level of Independence: Needs assistance   Gait / Transfers Assistance Needed: Uses rollator for ambulation with assistance ; last fall in january  ADL's / Homemaking Assistance Needed: PT is at an assisted living        Hand Dominance        Extremity/Trunk Assessment        Lower Extremity Assessment Lower Extremity Assessment: Generalized weakness       Communication   Communication: No difficulties  Cognition Arousal/Alertness: Lethargic Behavior During Therapy: WFL for tasks assessed/performed Overall Cognitive Status: Within Functional Limits for tasks assessed                                        General Comments      Exercises General Exercises - Lower Extremity Ankle Circles/Pumps: AAROM;Both;10 reps Quad Sets:  (attempted unable to get pt to complete.) Heel Slides: AAROM;10  reps;Both Hip ABduction/ADduction: AAROM;10 reps;Both   Assessment/Plan    PT Assessment Patient needs continued PT services  PT Problem List Decreased strength;Decreased range of motion;Decreased activity tolerance;Decreased balance;Decreased mobility;Pain       PT Treatment Interventions Therapeutic activities;Therapeutic exercise;Gait training;Functional mobility training    PT Goals (Current goals can be found in the Care Plan section)  Acute Rehab PT Goals Patient Stated Goal: daughter would like to get pt back to Assisted living PT Goal Formulation: With family Time For Goal Achievement: 08/19/21 Potential to Achieve Goals: Good    Frequency Min 4X/week   Barriers to discharge        Co-evaluation                AM-PAC PT "6 Clicks" Mobility  Outcome Measure Help needed turning from your back to your side while in a flat bed without using bedrails?: A Lot Help needed moving from lying on your back to sitting on the side of a flat bed without using bedrails?: A Lot Help needed moving to and from a bed to a chair (including a wheelchair)?: Total Help needed standing up from a chair using your arms (e.g., wheelchair or bedside chair)?: A Lot Help needed to walk in hospital room?: Total Help needed climbing 3-5 steps with a railing? : Total 6 Click Score: 9    End of Session Equipment Utilized During Treatment: Gait belt Activity Tolerance: Patient limited by lethargy;Patient limited by pain Patient left: in bed;with call bell/phone within reach;with family/visitor present   PT Visit Diagnosis: Unsteadiness on feet (R26.81);Other abnormalities of gait and mobility (R26.89);Muscle weakness (generalized) (M62.81);History of falling (Z91.81)    Time: 0900-0930 PT Time Calculation (min) (ACUTE ONLY): 30 min   Charges:   PT Evaluation $PT Eval Low Complexity: 1 Low PT Treatments $Therapeutic Exercise: 8-22 mins       Virgina Organ, PT CLT [phone]  06/16/2021, 11:08 AM

## 2021-06-17 ENCOUNTER — Encounter (HOSPITAL_COMMUNITY): Payer: Self-pay | Admitting: Orthopedic Surgery

## 2021-06-17 DIAGNOSIS — F411 Generalized anxiety disorder: Secondary | ICD-10-CM | POA: Diagnosis not present

## 2021-06-17 DIAGNOSIS — R2689 Other abnormalities of gait and mobility: Secondary | ICD-10-CM | POA: Diagnosis not present

## 2021-06-17 DIAGNOSIS — R531 Weakness: Secondary | ICD-10-CM | POA: Diagnosis not present

## 2021-06-17 DIAGNOSIS — N179 Acute kidney failure, unspecified: Secondary | ICD-10-CM

## 2021-06-17 DIAGNOSIS — E119 Type 2 diabetes mellitus without complications: Secondary | ICD-10-CM | POA: Diagnosis not present

## 2021-06-17 DIAGNOSIS — K219 Gastro-esophageal reflux disease without esophagitis: Secondary | ICD-10-CM | POA: Diagnosis not present

## 2021-06-17 DIAGNOSIS — R41841 Cognitive communication deficit: Secondary | ICD-10-CM | POA: Diagnosis not present

## 2021-06-17 DIAGNOSIS — J84112 Idiopathic pulmonary fibrosis: Secondary | ICD-10-CM | POA: Diagnosis not present

## 2021-06-17 DIAGNOSIS — M6281 Muscle weakness (generalized): Secondary | ICD-10-CM | POA: Diagnosis not present

## 2021-06-17 DIAGNOSIS — R1312 Dysphagia, oropharyngeal phase: Secondary | ICD-10-CM | POA: Diagnosis not present

## 2021-06-17 DIAGNOSIS — Z794 Long term (current) use of insulin: Secondary | ICD-10-CM | POA: Diagnosis not present

## 2021-06-17 DIAGNOSIS — S72142D Displaced intertrochanteric fracture of left femur, subsequent encounter for closed fracture with routine healing: Secondary | ICD-10-CM | POA: Diagnosis not present

## 2021-06-17 DIAGNOSIS — J189 Pneumonia, unspecified organism: Secondary | ICD-10-CM

## 2021-06-17 DIAGNOSIS — Z96642 Presence of left artificial hip joint: Secondary | ICD-10-CM | POA: Diagnosis not present

## 2021-06-17 DIAGNOSIS — Z9181 History of falling: Secondary | ICD-10-CM | POA: Diagnosis not present

## 2021-06-17 DIAGNOSIS — D62 Acute posthemorrhagic anemia: Secondary | ICD-10-CM

## 2021-06-17 DIAGNOSIS — I10 Essential (primary) hypertension: Secondary | ICD-10-CM | POA: Diagnosis not present

## 2021-06-17 DIAGNOSIS — S72002A Fracture of unspecified part of neck of left femur, initial encounter for closed fracture: Secondary | ICD-10-CM | POA: Diagnosis not present

## 2021-06-17 DIAGNOSIS — J449 Chronic obstructive pulmonary disease, unspecified: Secondary | ICD-10-CM | POA: Diagnosis not present

## 2021-06-17 DIAGNOSIS — F32A Depression, unspecified: Secondary | ICD-10-CM | POA: Diagnosis not present

## 2021-06-17 LAB — CBC
HCT: 29.4 % — ABNORMAL LOW (ref 36.0–46.0)
Hemoglobin: 9.4 g/dL — ABNORMAL LOW (ref 12.0–15.0)
MCH: 32.6 pg (ref 26.0–34.0)
MCHC: 32 g/dL (ref 30.0–36.0)
MCV: 102.1 fL — ABNORMAL HIGH (ref 80.0–100.0)
Platelets: 115 10*3/uL — ABNORMAL LOW (ref 150–400)
RBC: 2.88 MIL/uL — ABNORMAL LOW (ref 3.87–5.11)
RDW: 15.8 % — ABNORMAL HIGH (ref 11.5–15.5)
WBC: 14.3 10*3/uL — ABNORMAL HIGH (ref 4.0–10.5)
nRBC: 0 % (ref 0.0–0.2)

## 2021-06-17 LAB — BASIC METABOLIC PANEL
Anion gap: 8 (ref 5–15)
BUN: 28 mg/dL — ABNORMAL HIGH (ref 8–23)
CO2: 28 mmol/L (ref 22–32)
Calcium: 8.8 mg/dL — ABNORMAL LOW (ref 8.9–10.3)
Chloride: 106 mmol/L (ref 98–111)
Creatinine, Ser: 0.7 mg/dL (ref 0.44–1.00)
GFR, Estimated: >60 mL/min (ref >60)
Glucose, Bld: 208 mg/dL — ABNORMAL HIGH (ref 70–99)
Potassium: 3.6 mmol/L (ref 3.5–5.1)
Sodium: 142 mmol/L (ref 135–145)

## 2021-06-17 LAB — RESP PANEL BY RT-PCR (FLU A&B, COVID) ARPGX2
Influenza A by PCR: NEGATIVE
Influenza B by PCR: NEGATIVE
SARS Coronavirus 2 by RT PCR: NEGATIVE

## 2021-06-17 LAB — GLUCOSE, CAPILLARY
Glucose-Capillary: 240 mg/dL — ABNORMAL HIGH (ref 70–99)
Glucose-Capillary: 331 mg/dL — ABNORMAL HIGH (ref 70–99)

## 2021-06-17 MED ORDER — AZITHROMYCIN 250 MG PO TABS
500.0000 mg | ORAL_TABLET | Freq: Once | ORAL | Status: AC
  Administered 2021-06-17: 500 mg via ORAL
  Filled 2021-06-17: qty 2

## 2021-06-17 MED ORDER — INSULIN ASPART 100 UNIT/ML FLEXPEN: 0.0000 [IU] | PEN_INJECTOR | Freq: Three times a day (TID) | SUBCUTANEOUS | 11 refills | Status: DC

## 2021-06-17 MED ORDER — POLYETHYLENE GLYCOL 3350 17 G PO PACK: 17.0000 g | PACK | Freq: Every day | ORAL | 0 refills | Status: DC

## 2021-06-17 MED ORDER — SENNOSIDES-DOCUSATE SODIUM 8.6-50 MG PO TABS: 2.0000 | ORAL_TABLET | Freq: Every day | ORAL | 3 refills | Status: DC

## 2021-06-17 MED ORDER — ONDANSETRON HCL 4 MG PO TABS: 4.0000 mg | ORAL_TABLET | Freq: Four times a day (QID) | ORAL | 0 refills | Status: AC | PRN

## 2021-06-17 MED ORDER — ASPIRIN 325 MG PO TBEC: 325.0000 mg | DELAYED_RELEASE_TABLET | Freq: Every day | ORAL | 0 refills | Status: DC

## 2021-06-17 MED ORDER — CEFDINIR 300 MG PO CAPS
300.0000 mg | ORAL_CAPSULE | Freq: Once | ORAL | Status: AC
  Administered 2021-06-17: 300 mg via ORAL
  Filled 2021-06-17: qty 1

## 2021-06-17 MED ORDER — AMLODIPINE BESYLATE 2.5 MG PO TABS: 2.5000 mg | ORAL_TABLET | Freq: Every day | ORAL | 3 refills | Status: DC

## 2021-06-17 MED ORDER — ACETAMINOPHEN 650 MG RE SUPP: 650.0000 mg | RECTAL | 0 refills | Status: DC | PRN

## 2021-06-17 MED ORDER — METHOCARBAMOL 500 MG PO TABS: 500.0000 mg | ORAL_TABLET | Freq: Three times a day (TID) | ORAL | 1 refills | Status: DC

## 2021-06-17 MED ORDER — OXYCODONE HCL 5 MG PO TABS: 5.0000 mg | ORAL_TABLET | Freq: Four times a day (QID) | ORAL | 0 refills | Status: AC | PRN

## 2021-06-17 MED ORDER — CEFDINIR 300 MG PO CAPS: 300.0000 mg | ORAL_CAPSULE | Freq: Two times a day (BID) | ORAL | 0 refills | Status: AC

## 2021-06-17 NOTE — Care Management Important Message (Signed)
Important Message  Patient Details  Name: Lauren Avery MRN: [id number] Date of Birth: June 22, 1927   Medicare Important Message Given:  Yes     Corey Harold 06/17/2021, 11:10 AM

## 2021-06-17 NOTE — Progress Notes (Signed)
Physical Therapy Treatment Patient Details Name: Lauren Avery MRN: [id number] DOB: 11-Apr-1927 Today's Date: 06/17/2021    History of Present Illness Lauren Avery is a 87 y.o. female with medical history significant of hypertension, diabetes, osteoporosis, COPD/pulmonary fibrosis and GERD; who presented to the hospital secondary to mechanical fall and left hip pain.  Patient with limited mobility at baseline but able to transfer out of her wheelchair and walk short distances.  Reports that in the way to the bathroom this morning she ended up falling out of the wheelchair landing on her left hip and experiencing excruciating pain. Pt sustained a Lt hip fx and recieved an IM nailing. Pt is WBAT    PT Comments    Patient demonstrates slow labored movement for sitting up at bedside with Mod assist to move LLE and scooting to EOB, demonstrates fair/good return for completing BLE ROM/strengthening exercises requiring active assistance to complete LLE hip flexion during seated marching in place, very unsteady on feet with limited movement of LLE when attempting steps at bedside having to drag/shuffle LLE during transfer to chair due to pain and weakness.  Patient tolerated sitting up in chair after therapy with her daughter in room.  Patient on room air during treatment with SpO2 at 91-94%, but HR increased to 130-140's with exertion - RN notified.      Follow Up Recommendations  SNF     Equipment Recommendations  None recommended by PT    Recommendations for Other Services       Precautions / Restrictions Precautions Precautions: Fall Restrictions Weight Bearing Restrictions: Yes LLE Weight Bearing: Weight bearing as tolerated    Mobility  Bed Mobility Overal bed mobility: Needs Assistance Bed Mobility: Supine to Sit       Sit to supine: Mod assist   General bed mobility comments: increased time, labored movement, fair return for using BUE to scoot self to EOB     Transfers Overall transfer level: Needs assistance Equipment used: Rolling walker (2 wheeled) Transfers: Sit to/from [employer] Sit to Stand: Mod assist Stand pivot transfers: Mod assist;Max assist       General transfer comment: slow labored movement with limited weightbearing on LLE due to increased pain  Ambulation/Gait Ambulation/Gait assistance: Max assist Gait Distance (Feet): 4 Feet Assistive device: Rolling walker (2 wheeled) Gait Pattern/deviations: Decreased step length - right;Decreased step length - left;Decreased stance time - left;Decreased stride length;Antalgic;Shuffle Gait velocity: slow   General Gait Details: patient unable to take steps with LLE due to weakness, increased pain, has to shuffle/drag LLE during transfer to chair   Stairs             Wheelchair Mobility    Modified Rankin (Stroke Patients Only)       Balance Overall balance assessment: Needs assistance Sitting-balance support: Feet supported;No upper extremity supported Sitting balance-Leahy Scale: Good Sitting balance - Comments: seated at EOB   Standing balance support: During functional activity;Bilateral upper extremity supported Standing balance-Leahy Scale: Poor Standing balance comment: using RW                            Cognition Arousal/Alertness: Awake/alert Behavior During Therapy: WFL for tasks assessed/performed Overall Cognitive Status: Within Functional Limits for tasks assessed  Exercises General Exercises - Lower Extremity Long Arc Quad: Seated;AROM;Strengthening;Both;10 reps Hip Flexion/Marching: Seated;AROM;AAROM;Both;10 reps;Strengthening Toe Raises: Seated;AROM;Strengthening;Both;10 reps Heel Raises: Seated;AROM;Strengthening;Both;10 reps    General Comments        Pertinent Vitals/Pain Pain Assessment: Faces Faces Pain Scale: Hurts little more Pain Location:  with movement of LLE Pain Descriptors / Indicators: Grimacing;Guarding;Sore Pain Intervention(s): Limited activity within patient's tolerance;Monitored during session;Repositioned;Premedicated before session    Home Living                      Prior Function            PT Goals (current goals can now be found in the care plan section) Acute Rehab PT Goals Patient Stated Goal: daughter would like to get pt back to Assisted living after rehab PT Goal Formulation: With patient Time For Goal Achievement: 07/14/21 Potential to Achieve Goals: Good Progress towards PT goals: Progressing toward goals    Frequency    Min 4X/week      PT Plan Current plan remains appropriate    Co-evaluation              AM-PAC PT "6 Clicks" Mobility   Outcome Measure  Help needed turning from your back to your side while in a flat bed without using bedrails?: A Lot Help needed moving from lying on your back to sitting on the side of a flat bed without using bedrails?: A Lot Help needed moving to and from a bed to a chair (including a wheelchair)?: A Lot Help needed standing up from a chair using your arms (e.g., wheelchair or bedside chair)?: A Lot Help needed to walk in hospital room?: A Lot Help needed climbing 3-5 steps with a railing? : Total 6 Click Score: 11    End of Session Equipment Utilized During Treatment: Oxygen Activity Tolerance: Patient tolerated treatment well;Patient limited by fatigue Patient left: in chair;with call bell/phone within reach;with family/visitor present Nurse Communication: Mobility status PT Visit Diagnosis: Unsteadiness on feet (R26.81);Other abnormalities of gait and mobility (R26.89);Muscle weakness (generalized) (M62.81);History of falling (Z91.81)     Time: 4825-0037 PT Time Calculation (min) (ACUTE ONLY): 28 min  Charges:  $Therapeutic Exercise: 8-22 mins $Therapeutic Activity: 8-22 mins                     11:12 AM,  06/17/21 Ocie Bob, MPT Physical Therapist with [facility] [phone] office [phone] mobile phone

## 2021-06-17 NOTE — Discharge Summary (Addendum)
Lauren Avery, is a 87 y.o. female  DOB 1927-01-14  MRN [id number].  Admission date:  06/12/2021  Admitting Physician  Vassie Loll, MD  Discharge Date:  06/17/2021   Primary MD  Georgiann Hahn, MD  Recommendations for primary care physician for things to follow:   1)Lt Hip Postoperative plan Weightbearing as tolerated on Lt lower extremity Orthopedic follow-up visit in 4 weeks for x-ray--with Dr. Fuller Canada in [place] Aspirin 325 mg daily with food for DVT prevention for 30 days  2) sliding scale insulin with NovoLog coverage-- 0 to 10 units  -insulin aspart (novoLOG) injection 0-10 Units 0-10 Units Subcutaneous, 3 times daily with meals CBG < 70: Implement Hypoglycemia Standing Orders and refer to Hypoglycemia Standing Orders sidebar report  CBG 70 - 120: 0 unit CBG 121 - 150: 0 unit  CBG 151 - 200: 1 unit CBG 201 - 250: 2 units CBG 251 - 300: 4 units CBG 301 - 350: 6 units  CBG 351 - 400: 8 units  CBG > 400: 10 units  3)Repeat CBC and BMP Blood test advised on Thursday, 06/20/2021   Admission Diagnosis  Hip fracture (HCC) [S72.009A] Fall from chair, initial encounter [W07.XXXA] Closed 2-part intertrochanteric fracture of left femur, initial encounter [facility]) [S72.142A]   Discharge Diagnosis  Hip fracture (HCC) [S72.009A] Fall from chair, initial encounter [W07.XXXA] Closed 2-part intertrochanteric fracture of left femur, initial encounter (HCC) [S72.142A]    Principal Problem:   Hip fracture (HCC) Active Problems:   AKI (acute kidney injury) (HCC)   Pneumonia--?? Aspiration   IPF (idiopathic pulmonary fibrosis) (HCC)   Acute blood loss anemia (ABLA)--post Lt Hip Surgery   Diabetes mellitus type 2 in nonobese [facility])      Past Medical History:  Diagnosis Date   Diabetes mellitus    Fracture 10/2012   left wrist   Fracture of left clavicle    12/12 wore a sling   GERD  (gastroesophageal reflux disease)    Hypertension    Osteoporosis    Pulmonary fibrosis (HCC)     Past Surgical History:  Procedure Laterality Date   FEMUR FRACTURE SURGERY      6/08 partial hip replacement right   ganglion cyst removal     left wrist 1960   HIP FRACTURE SURGERY     1984 pin and plate placed right   HIP SURGERY     pin and plate removed 0/9983   INTRAMEDULLARY (IM) NAIL INTERTROCHANTERIC Left 06/14/2021   Procedure: INTRAMEDULLARY (IM) NAIL INTERTROCHANTRIC;  Surgeon: Vickki Hearing, MD;  Location: [facility];  Service: Orthopedics;  Laterality: Left;   ORIF ELBOW FRACTURE Right 02/18/2013   Procedure: OPEN REDUCTION INTERNAL FIXATION (ORIF) ELBOW/OLECRANON FRACTURE;  Surgeon: Nadara Mustard, MD;  Location: [facility] OR;  Service: Orthopedics;  Laterality: Right;   ORIF ELBOW FRACTURE Left 05/09/2015   Procedure: OPEN REDUCTION INTERNAL FIXATION (ORIF) LEFT MEDIAL HUMERAL CONDYLE FRACTURE;  Surgeon: Tarry Kos, MD;  Location: [facility] OR;  Service: Orthopedics;  Laterality: Left;  HPI  from the history and physical done on the day of admission:     Chief Complaint: Mechanical fall and left hip pain.   HPI: Lauren Avery is a 87 y.o. female with medical history significant of hypertension, diabetes, osteoporosis, COPD/pulmonary fibrosis and GERD; who presented to the hospital secondary to mechanical fall and left hip pain.  Patient with limited mobility at baseline but able to transfer out of her wheelchair and walk short distances.  Reports that in the way to the bathroom this morning she ended up falling out of the wheelchair landing on her left hip and experiencing excruciating pain.  Patient denies any fever, chills, shortness of breath, cough, dysuria, hematuria, melena, hematochezia, chest pain, loss of consciousness or any other complaints.   Patient is vaccinated against COVID and boosted.   ED Course: Blood work overall stable and at baseline for patient.  X-rays  demonstrating acute left hip fracture.  Physical exam with shortened and externally rotated left leg; significant pain with any movement.  IV pain medication and gentle fluid provided at time of admission.  Orthopedic service consulted and [facility] contacted to place patient in the hospital for further evaluation and management.   Review of Systems: As per HPI otherwise all other systems reviewed and are negative    Hospital Course:    Brief Summary:- 87 y.o. female with medical history significant of hypertension, diabetes, osteoporosis, COPD/pulmonary fibrosis and GERD admitted on 06/13/2019 with left hip fracture after mechanical fall   A/p 1)Lt Hip Fx--orthopedic consult appreciated S/p  orthopedic fixation on 06/15/2019 -Continue methocarbamol and prn oxycodone  -Physical therapy eval noted, recommends SNF rehab -Lt Hip Postoperative plan:- Weightbearing as tolerated on Lt lower extremity Orthopedic follow-up visit in 4 weeks for x-ray--with Dr. Fuller Canada in [place] Aspirin 325 mg daily with food for DVT prevention for 30 days   2)HTN-stable, stop losartan due to AKI, may use amlodipine 2.5 mg daily for BP   3)DM2-resume  Levemir   -A1c 7.3 reflecting uncontrolled DM with hyperglycemia PTA Use Novolog/Humalog Sliding scale insulin with Accu-Cheks/Fingersticks as ordered   4) AKI----acute kidney injury  --creatinine is down to 0.70 from 1.49 from a baseline of 0.9, creatinine on 06/14/2021 was 1.1--- suspect ATN in the setting of dehydration and hypotension--- -Renal function has normalized with improvement in BP and hydration -- , renally adjust medications, avoid nephrotoxic agents / dehydration  / hypotension   5)Depression/anxiety--- stable, continue Zoloft 25 mg daily   6) acute hypoxic respiratory failure--- patient with underlying COPD/pulmonary fibrosis, postop on 06/14/2021 patient developed worsening hypoxia, tachypnea and tachycardia chest x-ray suggestive of possible  right-sided lower lobe pneumonia--cannot rule out aspiration component -Continue IV Rocephin and azithromycin along with bronchodilators -Repeat chest x-ray on 06/16/2021 shows underlying pulmonary fibrosis and improving right-sided pneumonia -Okay to discharge on Omnicef -Discharge on oxygen via nasal cannula at 2 L/min continuously   7)ABLA--due to Lt hip fracture and hip surgery--Baseline hemoglobin usually between 12-14,, postop hemoglobin was as low as 7.9,--hemoglobin currently back up to 9.4-- -- Hemodynamically stable  8) leukocytosis--- suspect this is reactive in the setting of hip fracture and hip surgery, possible aspiration pneumonia could be contributory -WBC down to 14.3 from 19.1 on 06/15/2021--- discharge on Omnicef for pneumonia, she completed azithromycin   Disposition- to SNF with - oxygen via nasal cannula at 2 L/min continuously   Disposition: The patient is from: Home  Anticipated d/c is to: SNF                Code Status : -  Code Status: DNR    Family Communication:   Discussed with daughter at bedside Consults  :  ortho   DVT Prophylaxis  :   -ASA 325 mg daily x 1 month  Discharge Condition: stable----Discharge on oxygen via nasal cannula at 2 L/min continuously  Follow UP   Contact information for follow-up providers     Vickki Hearing, MD. Schedule an appointment as soon as possible for a visit on 07/11/2021.   Specialties: Orthopedic Surgery, Radiology Contact information: [address] [place] [address] [phone]              Contact information for after-discharge care     Destination     [facility] Preferred SNF .   Service: Skilled Nursing Contact information: [address] [place] [address] [phone]                     Diet and Activity recommendation:  As advised  Discharge Instructions    Discharge Instructions      Call MD for:  difficulty breathing, headache or visual disturbances   Complete by: As directed    Call MD for:  persistant dizziness or light-headedness   Complete by: As directed    Call MD for:  persistant nausea and vomiting   Complete by: As directed    Call MD for:  severe uncontrolled pain   Complete by: As directed    Call MD for:  temperature >100.4   Complete by: As directed    Diet - low sodium heart healthy   Complete by: As directed    Diet Carb Modified   Complete by: As directed    Discharge instructions   Complete by: As directed    1)Lt Hip Postoperative plan Weightbearing as tolerated on Lt lower extremity Orthopedic follow-up visit in 4 weeks for x-ray--with Dr. Fuller Canada in [place] Aspirin 325 mg daily with food for DVT prevention for 30 days  2) sliding scale insulin with NovoLog coverage-- 0 to 10 units  -insulin aspart (novoLOG) injection 0-10 Units 0-10 Units Subcutaneous, 3 times daily with meals CBG < 70: Implement Hypoglycemia Standing Orders and refer to Hypoglycemia Standing Orders sidebar report  CBG 70 - 120: 0 unit CBG 121 - 150: 0 unit  CBG 151 - 200: 1 unit CBG 201 - 250: 2 units CBG 251 - 300: 4 units CBG 301 - 350: 6 units  CBG 351 - 400: 8 units  CBG > 400: 10 units  3)Repeat CBC and BMP Blood test advised on Thursday, 06/20/2021   Discharge wound care:   Complete by: As directed    Keep left hip dressing clean and dry   Increase activity slowly   Complete by: As directed          Discharge Medications     Allergies as of 06/17/2021       Reactions   Hydrochlorothiazide Other (See Comments), Itching   Taken from faxed notes of dr Wylene Simmer (to short stay)  THROMBOCYTOPENIA Taken from faxed notes of dr Wylene Simmer (to short stay)  THROMBOCYTOPENIA   Prilosec [omeprazole] Diarrhea        Medication List     STOP taking these medications    acetaminophen 325 MG tablet Commonly known as: TYLENOL Replaced by:  acetaminophen 650  MG suppository   losartan 50 MG tablet Commonly known as: COZAAR       TAKE these medications    acetaminophen 650 MG suppository Commonly known as: TYLENOL Place 1 suppository (650 mg total) rectally every 4 (four) hours as needed for fever or mild pain. Replaces: acetaminophen 325 MG tablet   Advair Diskus 500-50 MCG/ACT Aepb Generic drug: fluticasone-salmeterol INHALE 1 PUFF INTO THE LUNGS TWICE DAILY What changed:  how much to take how to take this when to take this additional instructions   amLODipine 2.5 MG tablet Commonly known as: NORVASC Take 1 tablet (2.5 mg total) by mouth daily. Start taking on: June 18, 2021   aspirin 325 MG EC tablet Take 1 tablet (325 mg total) by mouth daily with breakfast. Start taking on: June 18, 2021   calcitonin (salmon) 200 UNIT/ACT nasal spray Commonly known as: MIACALCIN/FORTICAL SMARTSIG:Both Nares   cefdinir 300 MG capsule Commonly known as: OMNICEF Take 1 capsule (300 mg total) by mouth 2 (two) times daily for 3 days.   D-5000 125 MCG (5000 UT) Tabs Generic drug: Cholecalciferol Take 125 mcg by mouth daily.   insulin aspart 100 UNIT/ML FlexPen Commonly known as: NOVOLOG Inject 0-10 Units into the skin 3 (three) times daily with meals. insulin aspart (novoLOG) injection 0-10 Units 0-10 Units Subcutaneous, 3 times daily with meals CBG < 70: Implement Hypoglycemia Standing Orders and refer to Hypoglycemia Standing Orders sidebar report  CBG 70 - 120: 0 unit CBG 121 - 150: 0 unit  CBG 151 - 200: 1 unit CBG 201 - 250: 2 units CBG 251 - 300: 4 units CBG 301 - 350: 6 units  CBG 351 - 400: 8 units  CBG > 400: 10 units   ketoconazole 2 % cream Commonly known as: NIZORAL Apply 1 application topically in the morning and at bedtime.   methocarbamol 500 MG tablet Commonly known as: ROBAXIN Take 1 tablet (500 mg total) by mouth 3 (three) times daily.   NovoFine Plus 32G X 4 MM Misc Generic drug: Insulin Pen Needle U UTD WITH  TRESIBA   ondansetron 4 MG tablet Commonly known as: ZOFRAN Take 1 tablet (4 mg total) by mouth every 6 (six) hours as needed for nausea.   OneTouch Verio test strip Generic drug: glucose blood USE TO SELF MONITOR BLOOD GLUCOSE 2 TIMES DAILY AND AS NEEDED   oxyCODONE 5 MG immediate release tablet Commonly known as: Roxicodone Take 1 tablet (5 mg total) by mouth every 6 (six) hours as needed for up to 5 days for severe pain.   pantoprazole 40 MG tablet Commonly known as: Protonix Take 1 tablet (40 mg total) by mouth 2 (two) times daily.   polyethylene glycol 17 g packet Commonly known as: MiraLax Take 17 g by mouth daily.   senna-docusate 8.6-50 MG tablet Commonly known as: Senokot-S Take 2 tablets by mouth at bedtime.   sertraline 25 MG tablet Commonly known as: ZOLOFT Take 25 mg by mouth at bedtime.   Evaristo Bury FlexTouch 100 UNIT/ML FlexTouch Pen Generic drug: insulin degludec Inject 8 Units into the skin at bedtime.               Discharge Care Instructions  (From admission, onward)           Start     Ordered   06/17/21 0000  Discharge wound care:       Comments: Keep left hip dressing clean and dry   06/17/21 1245  Major procedures and Radiology Reports - PLEASE review detailed and final reports for all details, in brief - \  DG Chest 1 View  Result Date: 06/12/2021 CLINICAL DATA:  Fall from wheelchair. EXAM: CHEST  1 VIEW COMPARISON:  Radiograph 04/11/2021 FINDINGS: Normal cardiac silhouette. Chronic interstitial lung disease unchanged. No consolidation. No pneumothorax. Rib fracture identified. IMPRESSION: 1. No evidence thoracic trauma. 2. Chronic interstitial lung disease. Electronically Signed   By: Genevive Bi M.D.   On: 06/12/2021 11:27   DG CHEST PORT 1 VIEW  Result Date: 06/16/2021 CLINICAL DATA:  History of pulmonary fibrosis. Respiratory abnormality. Shortness of breath. EXAM: PORTABLE CHEST 1 VIEW COMPARISON:  June 14, 2021 and June 12, 2021. FINDINGS: Findings of pulmonary fibrosis identified. The suspected possible acute opacity over the right base persists but is improved. No pneumothorax. No change in the cardiomediastinal silhouette. IMPRESSION: 1. Findings of fibrosis. 2. Persistent but improving superimposed right basilar opacity. 3. No other change. Electronically Signed   By: Gerome Sam III M.D   On: 06/16/2021 15:16   DG CHEST PORT 1 VIEW  Result Date: 06/14/2021 CLINICAL DATA:  Cough and short of breath EXAM: PORTABLE CHEST 1 VIEW COMPARISON:  06/12/2021, CT 01/25/2019, radiograph 04/11/2021 FINDINGS: Chronic lung disease with diffuse pulmonary fibrosis. No acute superimposed airspace disease on the left. Possible acute airspace disease at the right base. No pleural effusion. Stable cardiomediastinal silhouette. No pneumothorax. IMPRESSION: Chronic lung disease and fibrosis with possible acute mild superimposed airspace disease at the right base. Electronically Signed   By: Jasmine Pang M.D.   On: 06/14/2021 17:16   DG HIP OPERATIVE UNILAT WITH PELVIS LEFT  Result Date: 06/14/2021 CLINICAL DATA:  Status post left hip surgery EXAM: OPERATIVE LEFT HIP (WITH PELVIS IF PERFORMED) 12 VIEWS TECHNIQUE: Fluoroscopic spot image(s) were submitted for interpretation post-operatively. COMPARISON:  None. FINDINGS: Left intertrochanteric fracture transfixed with a intramedullary nail and multiple interlocking screws. Anatomic alignment. Postsurgical changes in the soft tissues overlying the left hip. IMPRESSION: Interval left intertrochanteric fracture ORIF. Electronically Signed   By: Elige Ko   On: 06/14/2021 12:55   DG HIP UNILAT WITH PELVIS 2-3 VIEWS LEFT  Result Date: 06/14/2021 CLINICAL DATA:  Status post ORIF of the proximal left femur EXAM: DG HIP (WITH OR WITHOUT PELVIS) 2-3V LEFT COMPARISON:  06/12/2021 FINDINGS: Postoperative changes within the left femur are noted. There is been interval placement of  cannulated screw and IM nail with reduction of the intertrochanteric fracture of the proximal left femur. Subacute left superior and inferior pubic rami fractures are again noted. Previous right hip arthroplasty. Diffuse osteopenia. IMPRESSION: Status post ORIF of intertrochanteric fracture of the proximal left femur. Electronically Signed   By: Signa Kell M.D.   On: 06/14/2021 13:39   DG HIP UNILAT W OR W/O PELVIS 2-3 VIEWS LEFT  Result Date: 06/12/2021 CLINICAL DATA:  Left hip pain EXAM: DG HIP (WITH OR WITHOUT PELVIS) 2-3V LEFT COMPARISON:  04/11/2021 FINDINGS: Acute, comminuted intertrochanteric fracture of the proximal left femur. Slight anterior apex angulation. Lesser trochanteric fragment displaced medially by approximately 8 mm. Greater tuberosity does not appear significantly displaced. Diffuse bony demineralization. Remote left superior and inferior pubic rami fractures. Prior left hip arthroplasty. Extensive vascular calcifications. IMPRESSION: Acute, comminuted intertrochanteric fracture of the proximal left femur. Electronically Signed   By: Duanne Guess D.O.   On: 06/12/2021 11:25    Micro Results   Recent Results (from the past 240 hour(s))  Surgical PCR screen  Status: None   Collection Time: 06/12/21  8:03 PM   Specimen: Nasal Mucosa; Nasal Swab  Result Value Ref Range Status   MRSA, PCR NEGATIVE NEGATIVE Final   Staphylococcus aureus NEGATIVE NEGATIVE Final    Comment: (NOTE) The Xpert SA Assay (FDA approved for NASAL specimens in patients 10822 years of age and older), is one component of a comprehensive surveillance program. It is not intended to diagnose infection nor to guide or monitor treatment. Performed at [facility], [address] [place], [place] [address]   Resp Panel by RT-PCR (Flu A&B, Covid) Nasopharyngeal Swab     Status: None   Collection Time: 06/17/21 10:50 AM   Specimen: Nasopharyngeal Swab; Nasopharyngeal(NP) swabs in vial transport medium   Result Value Ref Range Status   SARS Coronavirus 2 by RT PCR NEGATIVE NEGATIVE Final    Comment: (NOTE) SARS-CoV-2 target nucleic acids are NOT DETECTED.  The SARS-CoV-2 RNA is generally detectable in upper respiratory specimens during the acute phase of infection. The lowest concentration of SARS-CoV-2 viral copies this assay can detect is 138 copies/mL. A negative result does not preclude SARS-Cov-2 infection and should not be used as the sole basis for treatment or other patient management decisions. A negative result may occur with  improper specimen collection/handling, submission of specimen other than nasopharyngeal swab, presence of viral mutation(s) within the areas targeted by this assay, and inadequate number of viral copies(<138 copies/mL). A negative result must be combined with clinical observations, patient history, and epidemiological information. The expected result is Negative.  Fact Sheet for Patients:  BloggerCourse.comhttps://www.fda.gov/media/152166/download  Fact Sheet for Healthcare Providers:  SeriousBroker.ithttps://www.fda.gov/media/152162/download  This test is no t yet approved or cleared by the [place] FDA and  has been authorized for detection and/or diagnosis of SARS-CoV-2 by FDA under an Emergency Use Authorization (EUA). This EUA will remain  in effect (meaning this test can be used) for the duration of the COVID-19 declaration under Section 564(b)(1) of the Act, 21 U.S.C.section 360bbb-3(b)(1), unless the authorization is terminated  or revoked sooner.       Influenza A by PCR NEGATIVE NEGATIVE Final   Influenza B by PCR NEGATIVE NEGATIVE Final    Comment: (NOTE) The Xpert Xpress SARS-CoV-2/FLU/RSV plus assay is intended as an aid in the diagnosis of influenza from Nasopharyngeal swab specimens and should not be used as a sole basis for treatment. Nasal washings and aspirates are unacceptable for Xpert Xpress SARS-CoV-2/FLU/RSV testing.  Fact Sheet for  Patients: BloggerCourse.comhttps://www.fda.gov/media/152166/download  Fact Sheet for Healthcare Providers: SeriousBroker.ithttps://www.fda.gov/media/152162/download  This test is not yet approved or cleared by the [place] FDA and has been authorized for detection and/or diagnosis of SARS-CoV-2 by FDA under an Emergency Use Authorization (EUA). This EUA will remain in effect (meaning this test can be used) for the duration of the COVID-19 declaration under Section 564(b)(1) of the Act, 21 U.S.C. section 360bbb-3(b)(1), unless the authorization is terminated or revoked.  Performed at [facility], [address] [place], [place] [address]        Today   Subjective    Oris DroneMargaret Avery today has no new complaints  -Oral intake is fair, voiding well, respiratory status is improved, patient's daughter at bedside, questions answered --Discharge on oxygen via nasal cannula at 2 L/min continuously          Patient has been seen and examined prior to discharge   Objective   Blood pressure (!) 147/94, pulse (!) 110, temperature 98.3 F (36.8 C), temperature source  Oral, resp. rate 20, height 5\' 1"  (1.549 m), weight 49 kg, SpO2 100 %.   Intake/Output Summary (Last 24 hours) at 06/17/2021 1303 Last data filed at 06/17/2021 0900 Gross per 24 hour  Intake 110 ml  Output 1500 ml  Net -1390 ml    Exam Gen:-Awake, in no acute distress HEENT:- [place].AT, No sclera icterus Nose- [place] 2L/min Neck-Supple Neck,No JVD,. Lungs-improving air movement,, Velcro type brace noted CV- S1, S2 normal, regular Abd-  +ve B.Sounds, Abd Soft, No tenderness,    Extremity/Skin:- No  edema, pedal pulses present Psych-affect is appropriate, occasional episodes of confusion and disorientation neuro-generalized weakness, no new focal deficits, no tremors MSK-left hip postop wound is clean dry and intact   Data Review   CBC w Diff:  Lab Results  Component Value Date   WBC 14.3 (H) 06/17/2021   HGB 9.4 (L) 06/17/2021   HCT 29.4  (L) 06/17/2021   PLT 115 (L) 06/17/2021   LYMPHOPCT 9 04/11/2021   MONOPCT 8 04/11/2021   EOSPCT 0 04/11/2021   BASOPCT 0 04/11/2021    CMP:  Lab Results  Component Value Date   NA 142 06/17/2021   NA 138 05/16/2015   K 3.6 06/17/2021   CL 106 06/17/2021   CO2 28 06/17/2021   BUN 28 (H) 06/17/2021   BUN 28 (A) 05/16/2015   CREATININE 0.70 06/17/2021   GLU 136 05/16/2015   PROT 7.0 04/11/2021   ALBUMIN 3.6 04/11/2021   BILITOT 4.2 (H) 04/11/2021   ALKPHOS 116 04/11/2021   AST 55 (H) 04/11/2021   ALT 29 04/11/2021  .   Total Discharge time is about 33 minutes  06/11/2021 M.D on 06/17/2021 at 1:03 PM  Go to [email or web address] -  for contact info  [facility] - Office  [phone]

## 2021-06-17 NOTE — Discharge Instructions (Signed)
1)Lt Hip Postoperative plan Weightbearing as tolerated on Lt lower extremity Orthopedic follow-up visit in 4 weeks for x-ray--with Dr. Fuller [place] in [place] Aspirin 325 mg daily with food for DVT prevention for 30 days  2) sliding scale insulin with NovoLog coverage-- 0 to 10 units  -insulin aspart (novoLOG) injection 0-10 Units 0-10 Units Subcutaneous, 3 times daily with meals CBG < 70: Implement Hypoglycemia Standing Orders and refer to Hypoglycemia Standing Orders sidebar report  CBG 70 - 120: 0 unit CBG 121 - 150: 0 unit  CBG 151 - 200: 1 unit CBG 201 - 250: 2 units CBG 251 - 300: 4 units CBG 301 - 350: 6 units  CBG 351 - 400: 8 units  CBG > 400: 10 units  3)Repeat CBC and BMP Blood test advised on Thursday, 06/20/2021

## 2021-06-17 NOTE — TOC Transition Note (Signed)
Transition of Care [facility]) - CM/SW Discharge Note   Patient Details  Name: Lauren Avery MRN: [id number] Date of Birth: 1927-03-15  Transition [facility] Care [facility]) CM/SW Contact:  Elliot Gault, LCSW Phone Number: 06/17/2021, 1:15 PM   Clinical Narrative:     Pt stable for dc to SNF today per MD. Reviewed bed offers with family and they select [facility]. Spoke with Shawna Orleans at [facility] and they can accept pt today.   RN to call report. DC clinical sent electronically. EMS to transport.  There are no other TOC needs for dc.  Final next level of care: Skilled Nursing Facility Barriers to Discharge: Barriers Resolved   Patient Goals and CMS Choice Patient states their goals for this hospitalization and ongoing recovery are:: get better CMS Medicare.gov Compare Post Acute Care list provided to:: Patient Represenative (must comment) Choice offered to / list presented to : Adult Children  Discharge Placement              Patient chooses bed at: [facility] Patient to be transferred to facility by: EMS Name of family member notified: Clydie Braun Patient and family notified of of transfer: 06/17/21  Discharge Plan and Services In-house Referral: Clinical Social Work                                   Social Determinants of Health (SDOH) Interventions     Readmission Risk Interventions No flowsheet data found.

## 2021-06-17 NOTE — Progress Notes (Signed)
Inpatient Diabetes Program Recommendations  AACE/ADA: New Consensus Statement on Inpatient Glycemic Control (2015)  Target Ranges:  Prepandial:   less than 140 mg/dL      Peak postprandial:   less than 180 mg/dL (1-2 hours)      Critically ill patients:  140 - 180 mg/dL   Lab Results  Component Value Date   GLUCAP 240 (H) 06/17/2021   HGBA1C 7.3 (H) 06/12/2021    Review of Glycemic Control Results for TIFFINI, BLACKSHER (MRN [id number]) as of 06/17/2021 11:12  Ref. Range 06/17/2021 07:17  Glucose-Capillary Latest Ref Range: 70 - 99 mg/dL 545 (H)  Results for ETHELENE, CLOSSER (MRN [id number]) as of 06/17/2021 11:12  Ref. Range 06/16/2021 11:05 06/16/2021 16:27  Glucose-Capillary Latest Ref Range: 70 - 99 mg/dL 342 (H) 876 (H)   Diabetes history: DM 2 Outpatient Diabetes medications: Tresiba 8 units Current orders for Inpatient glycemic control: Novolog 0-9 units tid   A1c 7.3% on 7/13   Inpatient Diabetes Program Recommendations:     FSBG 240 mg/dL and post prandials yesterday exceeding 300's mg/dL. Despite poor intake, would consider adding Levemir 5 units QD back to inpatient regimen.   Thanks, Lujean Rave, MSN, RNC-OB Diabetes Coordinator [phone] (8a-5p)

## 2021-06-26 ENCOUNTER — Telehealth: Payer: Self-pay | Admitting: Podiatry

## 2021-06-26 ENCOUNTER — Encounter: Payer: Self-pay | Admitting: Podiatry

## 2021-06-26 NOTE — Telephone Encounter (Signed)
Called patient lvm to reschedule 8/24 appt and sent reschedule letter  

## 2021-07-11 ENCOUNTER — Ambulatory Visit: Payer: Medicare Other

## 2021-07-11 ENCOUNTER — Other Ambulatory Visit: Payer: Self-pay

## 2021-07-11 ENCOUNTER — Ambulatory Visit (INDEPENDENT_AMBULATORY_CARE_PROVIDER_SITE_OTHER): Payer: Medicare Other | Admitting: Orthopedic Surgery

## 2021-07-11 DIAGNOSIS — S72002D Fracture of unspecified part of neck of left femur, subsequent encounter for closed fracture with routine healing: Secondary | ICD-10-CM

## 2021-07-11 NOTE — Progress Notes (Signed)
POST OP   Chief Complaint  Patient presents with   Routine Post Op    DOS 06/14/21//follow up    Encounter Diagnosis  Name Primary?   Closed fracture of left hip with routine healing, subsequent encounter s/p IM nail 06/14/21 Yes    PROCEDURE: IM nailing of the left hip  POV #1, postop day 27 approximately 4 weeks  Meds related:   Surgery has been complicated by postop decubitus ulcer  X-rays of the fracture and implant show the implant to be in good position the fracture be healing appropriately  The decubitus ulcer will be treated at the nursing facility with the doctors there  Follow-up in 8 weeks

## 2021-07-15 ENCOUNTER — Inpatient Hospital Stay
Admission: RE | Admit: 2021-07-15 | Discharge: 2021-10-01 | Disposition: E | Payer: Medicare Other | Source: Ambulatory Visit | Attending: Internal Medicine | Admitting: Internal Medicine

## 2021-07-16 ENCOUNTER — Other Ambulatory Visit (HOSPITAL_COMMUNITY)
Admission: RE | Admit: 2021-07-16 | Discharge: 2021-07-16 | Disposition: A | Discharge status: Home / Self Care | Payer: Medicare Other | Source: Skilled Nursing Facility | Attending: Internal Medicine | Admitting: Internal Medicine

## 2021-07-16 ENCOUNTER — Encounter: Payer: Self-pay | Admitting: Adult Health

## 2021-07-16 ENCOUNTER — Non-Acute Institutional Stay (SKILLED_NURSING_FACILITY): Payer: Medicare Other | Admitting: Adult Health

## 2021-07-16 DIAGNOSIS — J84112 Idiopathic pulmonary fibrosis: Secondary | ICD-10-CM | POA: Diagnosis not present

## 2021-07-16 DIAGNOSIS — F339 Major depressive disorder, recurrent, unspecified: Secondary | ICD-10-CM

## 2021-07-16 DIAGNOSIS — I152 Hypertension secondary to endocrine disorders: Secondary | ICD-10-CM

## 2021-07-16 DIAGNOSIS — D696 Thrombocytopenia, unspecified: Secondary | ICD-10-CM

## 2021-07-16 DIAGNOSIS — E1122 Type 2 diabetes mellitus with diabetic chronic kidney disease: Secondary | ICD-10-CM

## 2021-07-16 DIAGNOSIS — I10 Essential (primary) hypertension: Secondary | ICD-10-CM | POA: Insufficient documentation

## 2021-07-16 DIAGNOSIS — R279 Unspecified lack of coordination: Secondary | ICD-10-CM | POA: Diagnosis not present

## 2021-07-16 DIAGNOSIS — N182 Chronic kidney disease, stage 2 (mild): Secondary | ICD-10-CM | POA: Diagnosis not present

## 2021-07-16 DIAGNOSIS — R41841 Cognitive communication deficit: Secondary | ICD-10-CM | POA: Diagnosis not present

## 2021-07-16 DIAGNOSIS — I129 Hypertensive chronic kidney disease with stage 1 through stage 4 chronic kidney disease, or unspecified chronic kidney disease: Secondary | ICD-10-CM

## 2021-07-16 DIAGNOSIS — K219 Gastro-esophageal reflux disease without esophagitis: Secondary | ICD-10-CM

## 2021-07-16 DIAGNOSIS — E559 Vitamin D deficiency, unspecified: Secondary | ICD-10-CM

## 2021-07-16 DIAGNOSIS — K5909 Other constipation: Secondary | ICD-10-CM

## 2021-07-16 DIAGNOSIS — I700 Atherosclerosis of aorta: Secondary | ICD-10-CM

## 2021-07-16 DIAGNOSIS — R1312 Dysphagia, oropharyngeal phase: Secondary | ICD-10-CM | POA: Diagnosis not present

## 2021-07-16 DIAGNOSIS — S72002G Fracture of unspecified part of neck of left femur, subsequent encounter for closed fracture with delayed healing: Secondary | ICD-10-CM

## 2021-07-16 DIAGNOSIS — E1159 Type 2 diabetes mellitus with other circulatory complications: Secondary | ICD-10-CM | POA: Diagnosis not present

## 2021-07-16 DIAGNOSIS — D62 Acute posthemorrhagic anemia: Secondary | ICD-10-CM

## 2021-07-16 DIAGNOSIS — M6281 Muscle weakness (generalized): Secondary | ICD-10-CM | POA: Diagnosis not present

## 2021-07-16 DIAGNOSIS — R627 Adult failure to thrive: Secondary | ICD-10-CM

## 2021-07-16 DIAGNOSIS — S72142D Displaced intertrochanteric fracture of left femur, subsequent encounter for closed fracture with routine healing: Secondary | ICD-10-CM | POA: Diagnosis not present

## 2021-07-16 DIAGNOSIS — E43 Unspecified severe protein-calorie malnutrition: Secondary | ICD-10-CM

## 2021-07-16 DIAGNOSIS — Z9181 History of falling: Secondary | ICD-10-CM | POA: Diagnosis not present

## 2021-07-16 DIAGNOSIS — Z4789 Encounter for other orthopedic aftercare: Secondary | ICD-10-CM | POA: Diagnosis not present

## 2021-07-16 LAB — HEMOGLOBIN A1C
Hgb A1c MFr Bld: 6.7 % — ABNORMAL HIGH (ref 4.8–5.6)
Mean Plasma Glucose: 145.59 mg/dL

## 2021-07-16 LAB — COMPREHENSIVE METABOLIC PANEL
ALT: 14 U/L (ref 0–44)
AST: 24 U/L (ref 15–41)
Albumin: 2.2 g/dL — ABNORMAL LOW (ref 3.5–5.0)
Alkaline Phosphatase: 187 U/L — ABNORMAL HIGH (ref 38–126)
Anion gap: 4 — ABNORMAL LOW (ref 5–15)
BUN: 15 mg/dL (ref 8–23)
CO2: 28 mmol/L (ref 22–32)
Calcium: 8.5 mg/dL — ABNORMAL LOW (ref 8.9–10.3)
Chloride: 99 mmol/L (ref 98–111)
Creatinine, Ser: 0.49 mg/dL (ref 0.44–1.00)
GFR, Estimated: >60 mL/min (ref >60)
Glucose, Bld: 203 mg/dL — ABNORMAL HIGH (ref 70–99)
Potassium: 3.6 mmol/L (ref 3.5–5.1)
Sodium: 131 mmol/L — ABNORMAL LOW (ref 135–145)
Total Bilirubin: 0.9 mg/dL (ref 0.3–1.2)
Total Protein: 5.8 g/dL — ABNORMAL LOW (ref 6.5–8.1)

## 2021-07-16 LAB — CBC
HCT: 30.7 % — ABNORMAL LOW (ref 36.0–46.0)
Hemoglobin: 9.7 g/dL — ABNORMAL LOW (ref 12.0–15.0)
MCH: 30.2 pg (ref 26.0–34.0)
MCHC: 31.6 g/dL (ref 30.0–36.0)
MCV: 95.6 fL (ref 80.0–100.0)
Platelets: 170 10*3/uL (ref 150–400)
RBC: 3.21 MIL/uL — ABNORMAL LOW (ref 3.87–5.11)
RDW: 14.6 % (ref 11.5–15.5)
WBC: 8.1 10*3/uL (ref 4.0–10.5)
nRBC: 0 % (ref 0.0–0.2)

## 2021-07-16 NOTE — Progress Notes (Signed)
Location:  [place] Room Number: 140-D Place of Service:  SNF (31)   CODE STATUS: DNR  Allergies  Allergen Reactions   Hydrochlorothiazide Other (See Comments) and Itching    Taken from faxed notes of dr Osborne Casco (to short stay)  THROMBOCYTOPENIA Taken from faxed notes of dr Osborne Casco (to short stay)  THROMBOCYTOPENIA   Prilosec [Omeprazole] Diarrhea    Chief Complaint  Patient presents with   Follow-up    Transfer follow-up     HPI:  She is a 87 year old woman who has been hospitalized from 06-12-21 through 06-17-21. Her medical history includes: diabetes; hypertension; GERD; pulmonary fibrosis. She had a mechanical fall and suffered a left hip fracture with IM nail done on 06-14-21. She was taken to SNF for rehab. She is here to complete her SNF rehab with long term placement. She denies any pain; denies any changes in appetite; there are no reports of insomnia or anxiety. She will continue to be followed for chronic illnesses including: IPF(idiopathic pulmonary fibrosis):     Type 2 diabetes with CKD stage 2 and hypertension:    Hypertension associated with type 2 diabetes mellitus:  Gastroesophageal reflux disease without esophagitis:  Past Medical History:  Diagnosis Date   Diabetes mellitus    Fracture 10/2012   left wrist   Fracture of left clavicle    12/12 wore a sling   GERD (gastroesophageal reflux disease)    Hypertension    Osteoporosis    Pulmonary fibrosis ([place])     Past Surgical History:  Procedure Laterality Date   FEMUR FRACTURE SURGERY      6/08 partial hip replacement right   ganglion cyst removal     left wrist 1960   HIP FRACTURE SURGERY     1984 pin and plate placed right   HIP SURGERY     pin and plate removed 03/7424   INTRAMEDULLARY (IM) NAIL INTERTROCHANTERIC Left 06/14/2021   Procedure: INTRAMEDULLARY (IM) NAIL INTERTROCHANTRIC;  Surgeon: Carole Civil, MD;  Location: [facility];  Service: Orthopedics;  Laterality: Left;    ORIF ELBOW FRACTURE Right 02/18/2013   Procedure: OPEN REDUCTION INTERNAL FIXATION (ORIF) ELBOW/OLECRANON FRACTURE;  Surgeon: Newt Minion, MD;  Location: [place];  Service: Orthopedics;  Laterality: Right;   ORIF ELBOW FRACTURE Left 05/09/2015   Procedure: OPEN REDUCTION INTERNAL FIXATION (ORIF) LEFT MEDIAL HUMERAL CONDYLE FRACTURE;  Surgeon: Leandrew Koyanagi, MD;  Location: [place];  Service: Orthopedics;  Laterality: Left;    Social History   Socioeconomic History   Marital status: Widowed    Spouse name: Not on file   Number of children: Y   Years of education: Not on file   Highest education level: Not on file  Occupational History   Occupation: retired Optometrist  Tobacco Use   Smoking status: Never   Smokeless tobacco: Never  Vaping Use   Vaping Use: Never used  Substance and Sexual Activity   Alcohol use: No   Drug use: No   Sexual activity: Not on file  Other Topics Concern   Not on file  Social History Narrative   Not on file   Social Determinants of Health   Financial Resource Strain: Not on file  Food Insecurity: Not on file  Transportation Needs: Not on file  Physical Activity: Not on file  Stress: Not on file  Social Connections: Not on file  Intimate Partner Violence: Not on file   Family History  Problem Relation Age  of Onset   Pancreatic cancer Brother    Stroke Mother    Diabetes Father       VITAL SIGNS BP (!) 101/52   Pulse 80   Temp 98.3 F (36.8 C)   Resp 20   Ht $R'5\' 1"'UM$  (1.549 m)   Wt 106 lb 3.2 oz (48.2 kg)   SpO2 92%   BMI 20.07 kg/m   Outpatient Encounter Medications as of 07/16/2021  Medication Sig   acetaminophen (TYLENOL) 650 MG suppository Place 1 suppository (650 mg total) rectally every 4 (four) hours as needed for fever or mild pain.   ADVAIR DISKUS 500-50 MCG/DOSE AEPB INHALE 1 PUFF INTO THE LUNGS TWICE DAILY   amLODipine (NORVASC) 2.5 MG tablet Take 1 tablet (2.5 mg total) by mouth daily.   antiseptic oral rinse (BIOTENE) LIQD  15 mLs by Mouth Rinse route every 6 (six) hours as needed for dry mouth.   aspirin EC 325 MG EC tablet Take 1 tablet (325 mg total) by mouth daily with breakfast.   D-5000 125 MCG (5000 UT) TABS Take 125 mcg by mouth daily.   insulin aspart (NOVOLOG FLEXPEN) 100 UNIT/ML FlexPen Inject 5 Units into the skin 3 (three) times daily with meals. Special Instructions: Give five units for blood sugar greater than (>) 150   insulin glargine (LANTUS SOLOSTAR) 100 UNIT/ML Solostar Pen Inject 8 Units into the skin at bedtime.   methocarbamol (ROBAXIN) 500 MG tablet Take 500 mg by mouth at bedtime.   mirtazapine (REMERON) 7.5 MG tablet Take 7.5 mg by mouth at bedtime.   ondansetron (ZOFRAN) 4 MG tablet Take 1 tablet (4 mg total) by mouth every 6 (six) hours as needed for nausea.   oxyCODONE (OXY IR/ROXICODONE) 5 MG immediate release tablet Take 5 mg by mouth every 6 (six) hours as needed for severe pain.   OXYGEN Inhale 2 L into the lungs continuous. Special Instructions: Document O2 saturation levels every shift; Day, Evening, Night   pantoprazole (PROTONIX) 40 MG tablet Take 40 mg by mouth daily.   polyethylene glycol (MIRALAX) 17 g packet Take 17 g by mouth daily.   senna-docusate (SENOKOT-S) 8.6-50 MG tablet Take 2 tablets by mouth at bedtime.   sertraline (ZOLOFT) 25 MG tablet Take 25 mg by mouth at bedtime.   UNABLE TO FIND DIET: Mechanical soft cons CHO diet   vitamin C (ASCORBIC ACID) 500 MG tablet Take 500 mg by mouth daily.   zinc gluconate 50 MG tablet Take 50 mg by mouth daily.   NOVOFINE PLUS 32G X 4 MM MISC U UTD WITH TRESIBA (Patient not taking: Reported on 07/16/2021)   ONETOUCH VERIO test strip USE TO SELF MONITOR BLOOD GLUCOSE 2 TIMES DAILY AND AS NEEDED (Patient not taking: Reported on 07/16/2021)   [DISCONTINUED] calcitonin, salmon, (MIACALCIN/FORTICAL) 200 UNIT/ACT nasal spray SMARTSIG:Both Nares   [DISCONTINUED] insulin aspart (NOVOLOG) 100 UNIT/ML FlexPen Inject 0-10 Units into the skin 3  (three) times daily with meals. insulin aspart (novoLOG) injection 0-10 Units 0-10 Units Subcutaneous, 3 times daily with meals CBG < 70: Implement Hypoglycemia Standing Orders and refer to Hypoglycemia Standing Orders sidebar report  CBG 70 - 120: 0 unit CBG 121 - 150: 0 unit  CBG 151 - 200: 1 unit CBG 201 - 250: 2 units CBG 251 - 300: 4 units CBG 301 - 350: 6 units  CBG 351 - 400: 8 units  CBG > 400: 10 units   [DISCONTINUED] ketoconazole (NIZORAL) 2 % cream Apply 1 application  topically in the morning and at bedtime.   [DISCONTINUED] methocarbamol (ROBAXIN) 500 MG tablet Take 1 tablet (500 mg total) by mouth 3 (three) times daily.   [DISCONTINUED] pantoprazole (PROTONIX) 40 MG tablet Take 1 tablet (40 mg total) by mouth 2 (two) times daily.   [DISCONTINUED] TRESIBA FLEXTOUCH 100 UNIT/ML SOPN FlexTouch Pen Inject 8 Units into the skin at bedtime.   No facility-administered encounter medications on file as of 07/16/2021.     SIGNIFICANT DIAGNOSTIC EXAMS  LABS REVIEWED TODAY;   06-12-21: wbc 7.6; hgb 14.6; hct 45.3; mcv 97.0 plt 108; glucose 276; bun 16; creat 0.92; k+ 4.2; na++ 135; ca 9.3; GFR 58; hgb a1c 7.3; vit D 42.29 06-15-21: wbc 19.7; hgb 8.0; hct 25.5; mcv 103.2 plt 100; glucose 238; bun 48; creat 1.49; k+ 4.8; na++ 139; ca 8.2 GFR 33; AM cortisol 28.8 07-16-21: wbc 8.1; hgb 9.7; hct 30.7; mcv 95.6 plt 170; glucose 203; bun 15; creat 0.49; k+ 3.6; na++ 131; ca 8.5 GFR >60; alk phos 187; albumin 2.2 hgb a1c 6.7    Review of Systems  Constitutional:  Negative for malaise/fatigue.  Respiratory:  Negative for cough and shortness of breath.   Cardiovascular:  Negative for chest pain, palpitations and leg swelling.  Gastrointestinal:  Negative for abdominal pain, constipation and heartburn.  Musculoskeletal:  Negative for back pain, joint pain and myalgias.  Skin: Negative.   Neurological:  Negative for dizziness.  Psychiatric/Behavioral:  The patient is not nervous/anxious.    Physical  Exam Constitutional:      General: She is not in acute distress.    Appearance: She is not diaphoretic.     Comments: Frail   Neck:     Thyroid: No thyromegaly.  Cardiovascular:     Rate and Rhythm: Normal rate and regular rhythm.     Pulses: Normal pulses.     Heart sounds: Normal heart sounds.  Pulmonary:     Effort: Pulmonary effort is normal. No respiratory distress.     Comments: 02 Diminished sounds  Abdominal:     General: Bowel sounds are normal. There is no distension.     Palpations: Abdomen is soft.     Tenderness: There is no abdominal tenderness.  Musculoskeletal:     Cervical back: Neck supple.     Right lower leg: No edema.     Left lower leg: No edema.     Comments: Able to move all extremities Status post IM Nail left hip 06-14-21   Lymphadenopathy:     Cervical: No cervical adenopathy.  Skin:    General: Skin is warm and dry.  Neurological:     Mental Status: She is alert. Mental status is at baseline.  Psychiatric:        Mood and Affect: Mood normal.      ASSESSMENT/ PLAN:  TODAY  Closed fracture of left hip with delayed healing subsequent encounter: is stable has oxycodone 5 mg every 6 hours as needed; will continue robaxin 500 mg nightly for pain management   2. IPF(idiopathic pulmonary fibrosis): is without change is on chronic 02 at 2l; will continue advair 500/50 twice daily   3. Type 2 diabetes with CKD stage 2 and hypertension: hgb a1c 6.7 will continue lantus 8 units nightly; novolog 5 units with meals; asa 325 mg daily   4. Hypertension associated with type 2 diabetes mellitus: b/p 101/52 is stable will continue norvasc 2.5 mg daily asa 325 mg daily   5. Gastroesophageal reflux disease  without esophagitis: is stable will continue protonix 40 mg daily   6. Severe protein calorie malnutrition: is without changes albumin 2.2 will begin prostat 30 mL three times daily   7. Chronic constipation: is stable will continue miralax daily senna s  2 tabs daily   8. Major depression recurrent chronic: is stable will continue zoloft 25 mg daily remeron 7.5 mg nightly   9. Vit D deficiency: stable is 42.29 will continue vitamin D 50,000 units weekly   10. Acute blood loss anemia: (ABLA) is stable hgb is 9.7 will continue to monitor her status.   11. Adult failure to thrive: weight is 106 pounds will monitor   12. Thrombocytopenia: has resolved plt is 170 will monitor   13. Aortic atherosclerosis (ct 10-04-20) will monitor   14. CKD stage 2 due to type 2 diabetes mellitus: is stable bun 15; creat 0.49 GFR >60    Ok Edwards NP [facility]  Contact [phone] Monday through Friday 8am- 5pm  After hours call [phone]

## 2021-07-17 ENCOUNTER — Encounter: Payer: Self-pay | Admitting: Internal Medicine

## 2021-07-17 ENCOUNTER — Non-Acute Institutional Stay (SKILLED_NURSING_FACILITY): Payer: Medicare Other | Admitting: Internal Medicine

## 2021-07-17 DIAGNOSIS — R279 Unspecified lack of coordination: Secondary | ICD-10-CM | POA: Diagnosis not present

## 2021-07-17 DIAGNOSIS — D62 Acute posthemorrhagic anemia: Secondary | ICD-10-CM

## 2021-07-17 DIAGNOSIS — E43 Unspecified severe protein-calorie malnutrition: Secondary | ICD-10-CM

## 2021-07-17 DIAGNOSIS — I129 Hypertensive chronic kidney disease with stage 1 through stage 4 chronic kidney disease, or unspecified chronic kidney disease: Secondary | ICD-10-CM

## 2021-07-17 DIAGNOSIS — M6281 Muscle weakness (generalized): Secondary | ICD-10-CM | POA: Diagnosis not present

## 2021-07-17 DIAGNOSIS — E46 Unspecified protein-calorie malnutrition: Secondary | ICD-10-CM | POA: Insufficient documentation

## 2021-07-17 DIAGNOSIS — R627 Adult failure to thrive: Secondary | ICD-10-CM

## 2021-07-17 DIAGNOSIS — Z9181 History of falling: Secondary | ICD-10-CM | POA: Diagnosis not present

## 2021-07-17 DIAGNOSIS — R41841 Cognitive communication deficit: Secondary | ICD-10-CM | POA: Diagnosis not present

## 2021-07-17 DIAGNOSIS — I10 Essential (primary) hypertension: Secondary | ICD-10-CM

## 2021-07-17 DIAGNOSIS — R1312 Dysphagia, oropharyngeal phase: Secondary | ICD-10-CM | POA: Diagnosis not present

## 2021-07-17 DIAGNOSIS — S72142D Displaced intertrochanteric fracture of left femur, subsequent encounter for closed fracture with routine healing: Secondary | ICD-10-CM | POA: Diagnosis not present

## 2021-07-17 DIAGNOSIS — Z4789 Encounter for other orthopedic aftercare: Secondary | ICD-10-CM | POA: Diagnosis not present

## 2021-07-17 DIAGNOSIS — N179 Acute kidney failure, unspecified: Secondary | ICD-10-CM

## 2021-07-17 DIAGNOSIS — N182 Chronic kidney disease, stage 2 (mild): Secondary | ICD-10-CM

## 2021-07-17 DIAGNOSIS — E1122 Type 2 diabetes mellitus with diabetic chronic kidney disease: Secondary | ICD-10-CM

## 2021-07-17 DIAGNOSIS — S72002G Fracture of unspecified part of neck of left femur, subsequent encounter for closed fracture with delayed healing: Secondary | ICD-10-CM

## 2021-07-17 NOTE — Assessment & Plan Note (Signed)
ROS negative for bleeding dyscrasias & none reported by Staff Monitor CBC on  iron supplement

## 2021-07-17 NOTE — Progress Notes (Signed)
NURSING HOME LOCATION:  [facility] ROOM NUMBER:  [id number] D  CODE STATUS:  DNR  PCP:  Larkin Ina MD  This is a comprehensive admission note to this SNFperformed on this date less than 30 days from date of admission. Included are preadmission medical/surgical history; reconciled medication list; family history; social history and comprehensive review of systems.  Corrections and additions to the records were documented. Comprehensive physical exam was also performed. Additionally a clinical summary was entered for each active diagnosis pertinent to this admission in the Problem List to enhance continuity of care.  HPI: Patient was transferred to this facility from an SNF in [place], [place].  Apparently that facility is more of an assisted living and does not have the PT/OT services from which she might benefit after her most recent fracture.. She had been hospitalized 7/13 - 06/17/2021 for closed 2 part intratrochanteric fracture of the left femur sustained in a fall from a chair.Left intertrochanteric intramedullary nailing was completed 06/14/2021 by Dr. Fuller Canada. Apparently hospital course was complicated by AKI, possible aspiration pneumonia and acute blood loss anemia postop.  Past medical history includes medical diagnoses of GERD, hypertension, osteoporosis, pulmonary fibrosis, and diabetes w CKD Stage 2. Surgeries and procedures include femur fracture surgery with partial hip replacement on the right; ganglion cyst removal; pinning and placement of a plate in the right hip remotely; open reduction internal fixation of right elbow fracture; and open reduction internal fixation of left medial humeral condyle fracture.  Social history: Never smoked; no alcohol ingestion.  Family history: Noncontributory due to advanced age.  Of note her father had diabetes, mother CVA, and brother pancreatic cancer.   Review of systems: Date given as July 16, 2021.  She knew  that the POTUS's name began with a "B" but could not remember his name "off hand".  Her major complaint is being "tired".  When I asked why she had sustained so many fractures;her response was "different reasons".  She did not elaborate ,but she did deny any cardiac or neurologic prodrome as I went through the list.  She also denied any bleeding dyscrasias despite the anemia. She denies frank dysphagia but states that she is afraid of "strangling" when she eats. She continues to have pain in the left lower extremity particularly with position change.  She also describes weakness in the left lower extremity. She denied ever seeing a Pulmonologist despite the history of interstitial fibrosis and the need for ongoing oxygen.  Constitutional: No fever, significant weight change, fatigue  Eyes: No redness, discharge, pain, vision change ENT/mouth: No nasal congestion, purulent discharge, earache, change in hearing, sore throat  Cardiovascular: No chest pain, palpitations, paroxysmal nocturnal dyspnea, edema  Respiratory: No cough, sputum production, hemoptysis, significant snoring, apnea  Gastrointestinal: No heartburn, abdominal pain, nausea /vomiting, rectal bleeding, melena, change in bowels Genitourinary: No dysuria, hematuria, pyuria, incontinence, nocturia Dermatologic: No rash, pruritus, change in appearance of skin Neurologic: No dizziness, headache, syncope, seizures, numbness, tingling Psychiatric: No significant anxiety, depression, insomnia, anorexia Endocrine: No change in hair/skin/nails, excessive thirst, excessive hunger, excessive urination  Hematologic/lymphatic: No significant bruising, lymphadenopathy, abnormal bleeding  Physical exam:  Pertinent or positive findings: She appears her age and chronically ill.  She exhibits some generalized tremor intermittently.  Her voice is very weak and she pauses midsentence as if dyspneic.  She is on nasal oxygen.  Lacrimal glands are prominent  and lower lids are puffy.  Slight gallop cadence is present.  She  has diffuse coarse rales in all lung fields.  Abdomen is protuberant.  Pedal pulses are decreased.  There is clubbing of the nailbeds.  The limbs are atrophic.  Dressings are present over the heels.  At the time I entered the room they were changing her undergarment.  Sacral wound is dressed.  She has large external hemorrhoids without frank bleeding.  She has bruising over the right lower extremity laterally.  General appearance: no acute distress, increased work of breathing is present.   Lymphatic: No lymphadenopathy about the head, neck, axilla. Eyes: No conjunctival inflammation or lid edema is present. There is no scleral icterus. Ears:  External ear exam shows no significant lesions or deformities.   Nose:  External nasal examination shows no deformity or inflammation. Nasal mucosa are pink and moist without lesions, exudates Neck:  No thyromegaly, masses, tenderness noted.    Heart:  No murmur, click, rub.  Lungs:  without wheezes, rhonchi,  rubs. Abdomen: Bowel sounds are normal.  Abdomen is soft and nontender with no organomegaly, hernias, masses. GU: Deferred  Extremities:  No cyanosis,  edema. Neurologic exam: Balance, Rhomberg, finger to nose testing could not be completed due to clinical state Skin: Warm & dry w/o tenting. No significant rash.  See clinical summary under each active problem in the Problem List with associated updated therapeutic plan

## 2021-07-17 NOTE — Assessment & Plan Note (Addendum)
BP actually low on only low dose CCB; but in context of anemia.  Assess for anemia progression

## 2021-07-17 NOTE — Assessment & Plan Note (Addendum)
PT/OT at SNF. D/C Robaxin due to increased risk of fall in the context of history of multiple fractures.

## 2021-07-17 NOTE — Assessment & Plan Note (Addendum)
Clinically she is profoundly weak with limb atrophy. Nutritional supplement

## 2021-07-17 NOTE — Assessment & Plan Note (Signed)
DM with renal complications Glucose range as IP: 163-291 Current A1c:6.7% A1c goal : < 8% No hypoglycemia No change indicated

## 2021-07-17 NOTE — Assessment & Plan Note (Signed)
Medication List reviewed. No nephrotoxic agents identified. 

## 2021-07-17 NOTE — Assessment & Plan Note (Signed)
Her ability to participate with PT/OT is doubtful in view of the advanced comorbidities.  Palliative care would seem to be an appropriate consideration unfortunately.

## 2021-07-17 NOTE — Patient Instructions (Signed)
See assessment and plan under each diagnosis in the problem list and acutely for this visit 

## 2021-07-18 ENCOUNTER — Other Ambulatory Visit: Payer: Self-pay | Admitting: Adult Health

## 2021-07-18 MED ORDER — OXYCODONE HCL 5 MG PO TABS: 5.0000 mg | ORAL_TABLET | Freq: Four times a day (QID) | ORAL | 0 refills | Status: AC | PRN

## 2021-07-19 DIAGNOSIS — M6281 Muscle weakness (generalized): Secondary | ICD-10-CM | POA: Diagnosis not present

## 2021-07-19 DIAGNOSIS — R279 Unspecified lack of coordination: Secondary | ICD-10-CM | POA: Diagnosis not present

## 2021-07-19 DIAGNOSIS — R41841 Cognitive communication deficit: Secondary | ICD-10-CM | POA: Diagnosis not present

## 2021-07-19 DIAGNOSIS — S72142D Displaced intertrochanteric fracture of left femur, subsequent encounter for closed fracture with routine healing: Secondary | ICD-10-CM | POA: Diagnosis not present

## 2021-07-19 DIAGNOSIS — Z4789 Encounter for other orthopedic aftercare: Secondary | ICD-10-CM | POA: Diagnosis not present

## 2021-07-19 DIAGNOSIS — Z9181 History of falling: Secondary | ICD-10-CM | POA: Diagnosis not present

## 2021-07-19 DIAGNOSIS — R1312 Dysphagia, oropharyngeal phase: Secondary | ICD-10-CM | POA: Diagnosis not present

## 2021-07-22 DIAGNOSIS — M25562 Pain in left knee: Secondary | ICD-10-CM | POA: Diagnosis not present

## 2021-07-22 DIAGNOSIS — M1712 Unilateral primary osteoarthritis, left knee: Secondary | ICD-10-CM | POA: Diagnosis not present

## 2021-07-22 DIAGNOSIS — M25552 Pain in left hip: Secondary | ICD-10-CM | POA: Diagnosis not present

## 2021-07-22 DIAGNOSIS — R1312 Dysphagia, oropharyngeal phase: Secondary | ICD-10-CM | POA: Diagnosis not present

## 2021-07-22 DIAGNOSIS — M1612 Unilateral primary osteoarthritis, left hip: Secondary | ICD-10-CM | POA: Diagnosis not present

## 2021-07-22 DIAGNOSIS — M6281 Muscle weakness (generalized): Secondary | ICD-10-CM | POA: Diagnosis not present

## 2021-07-22 DIAGNOSIS — D696 Thrombocytopenia, unspecified: Secondary | ICD-10-CM | POA: Insufficient documentation

## 2021-07-22 DIAGNOSIS — E1122 Type 2 diabetes mellitus with diabetic chronic kidney disease: Secondary | ICD-10-CM | POA: Insufficient documentation

## 2021-07-22 DIAGNOSIS — Z4789 Encounter for other orthopedic aftercare: Secondary | ICD-10-CM | POA: Diagnosis not present

## 2021-07-22 DIAGNOSIS — N182 Chronic kidney disease, stage 2 (mild): Secondary | ICD-10-CM | POA: Insufficient documentation

## 2021-07-22 DIAGNOSIS — Z9181 History of falling: Secondary | ICD-10-CM | POA: Diagnosis not present

## 2021-07-22 DIAGNOSIS — E559 Vitamin D deficiency, unspecified: Secondary | ICD-10-CM | POA: Insufficient documentation

## 2021-07-22 DIAGNOSIS — F339 Major depressive disorder, recurrent, unspecified: Secondary | ICD-10-CM | POA: Insufficient documentation

## 2021-07-22 DIAGNOSIS — I700 Atherosclerosis of aorta: Secondary | ICD-10-CM | POA: Insufficient documentation

## 2021-07-22 DIAGNOSIS — R41841 Cognitive communication deficit: Secondary | ICD-10-CM | POA: Diagnosis not present

## 2021-07-22 DIAGNOSIS — R279 Unspecified lack of coordination: Secondary | ICD-10-CM | POA: Diagnosis not present

## 2021-07-22 DIAGNOSIS — S72142D Displaced intertrochanteric fracture of left femur, subsequent encounter for closed fracture with routine healing: Secondary | ICD-10-CM | POA: Diagnosis not present

## 2021-07-23 ENCOUNTER — Encounter: Payer: Self-pay | Admitting: Adult Health

## 2021-07-23 ENCOUNTER — Non-Acute Institutional Stay (SKILLED_NURSING_FACILITY): Payer: Medicare Other | Admitting: Adult Health

## 2021-07-23 ENCOUNTER — Other Ambulatory Visit: Payer: Self-pay | Admitting: Adult Health

## 2021-07-23 DIAGNOSIS — S72002G Fracture of unspecified part of neck of left femur, subsequent encounter for closed fracture with delayed healing: Secondary | ICD-10-CM | POA: Diagnosis not present

## 2021-07-23 DIAGNOSIS — M6281 Muscle weakness (generalized): Secondary | ICD-10-CM | POA: Diagnosis not present

## 2021-07-23 DIAGNOSIS — Z4789 Encounter for other orthopedic aftercare: Secondary | ICD-10-CM | POA: Diagnosis not present

## 2021-07-23 DIAGNOSIS — Z9181 History of falling: Secondary | ICD-10-CM | POA: Diagnosis not present

## 2021-07-23 DIAGNOSIS — R1312 Dysphagia, oropharyngeal phase: Secondary | ICD-10-CM | POA: Diagnosis not present

## 2021-07-23 DIAGNOSIS — R41841 Cognitive communication deficit: Secondary | ICD-10-CM | POA: Diagnosis not present

## 2021-07-23 DIAGNOSIS — S72142D Displaced intertrochanteric fracture of left femur, subsequent encounter for closed fracture with routine healing: Secondary | ICD-10-CM | POA: Diagnosis not present

## 2021-07-23 DIAGNOSIS — R279 Unspecified lack of coordination: Secondary | ICD-10-CM | POA: Diagnosis not present

## 2021-07-23 MED ORDER — OXYCODONE HCL 5 MG PO TABS: 5.0000 mg | ORAL_TABLET | Freq: Every day | ORAL | 0 refills | Status: AC | PRN

## 2021-07-23 NOTE — Progress Notes (Signed)
Location:  [place] Room Number: 104 Place of Service:  SNF (31)   CODE STATUS: full code   Allergies  Allergen Reactions   Hydrochlorothiazide Other (See Comments) and Itching    Taken from faxed notes of dr Osborne Casco (to short stay)  THROMBOCYTOPENIA Taken from faxed notes of dr Osborne Casco (to short stay)  THROMBOCYTOPENIA   Prilosec [Omeprazole] Diarrhea    Chief Complaint  Patient presents with   Acute Visit    Pain management     HPI:  She is status post left femur IM nail on 06-14-21. She continues to have pain. Therapy would like for her to have pain  medication prior to therapy. Her pain is interfering with her ability to participate in therapy.   Past Medical History:  Diagnosis Date   Diabetes mellitus    Fracture 10/2012   left wrist   Fracture of left clavicle    12/12 wore a sling   GERD (gastroesophageal reflux disease)    Hypertension    Osteoporosis    Pulmonary fibrosis ([place])     Past Surgical History:  Procedure Laterality Date   FEMUR FRACTURE SURGERY      6/08 partial hip replacement right   ganglion cyst removal     left wrist 1960   HIP FRACTURE SURGERY     1984 pin and plate placed right   HIP SURGERY     pin and plate removed 03/9162   INTRAMEDULLARY (IM) NAIL INTERTROCHANTERIC Left 06/14/2021   Procedure: INTRAMEDULLARY (IM) NAIL INTERTROCHANTRIC;  Surgeon: Carole Civil, MD;  Location: [facility];  Service: Orthopedics;  Laterality: Left;   ORIF ELBOW FRACTURE Right 02/18/2013   Procedure: OPEN REDUCTION INTERNAL FIXATION (ORIF) ELBOW/OLECRANON FRACTURE;  Surgeon: Newt Minion, MD;  Location: [place];  Service: Orthopedics;  Laterality: Right;   ORIF ELBOW FRACTURE Left 05/09/2015   Procedure: OPEN REDUCTION INTERNAL FIXATION (ORIF) LEFT MEDIAL HUMERAL CONDYLE FRACTURE;  Surgeon: Leandrew Koyanagi, MD;  Location: [place];  Service: Orthopedics;  Laterality: Left;    Social History   Socioeconomic History   Marital status:  Widowed    Spouse name: Not on file   Number of children: Y   Years of education: Not on file   Highest education level: Not on file  Occupational History   Occupation: retired Optometrist  Tobacco Use   Smoking status: Never   Smokeless tobacco: Never  Vaping Use   Vaping Use: Never used  Substance and Sexual Activity   Alcohol use: No   Drug use: No   Sexual activity: Not on file  Other Topics Concern   Not on file  Social History Narrative   Not on file   Social Determinants of Health   Financial Resource Strain: Not on file  Food Insecurity: Not on file  Transportation Needs: Not on file  Physical Activity: Not on file  Stress: Not on file  Social Connections: Not on file  Intimate Partner Violence: Not on file   Family History  Problem Relation Age of Onset   Pancreatic cancer Brother    Stroke Mother    Diabetes Father       VITAL SIGNS BP 110/61   Pulse 89   Temp 98.5 F (36.9 C)   Ht _0  (1.549 m)   Wt 106 lb 3.2 oz (48.2 kg)   BMI 20.07 kg/m   Outpatient Encounter Medications as of 07/23/2021  Medication Sig Note   acetaminophen (TYLENOL)  650 MG suppository Place 1 suppository (650 mg total) rectally every 4 (four) hours as needed for fever or mild pain.    ADVAIR DISKUS 500-50 MCG/DOSE AEPB INHALE 1 PUFF INTO THE LUNGS TWICE DAILY    amLODipine (NORVASC) 2.5 MG tablet Take 1 tablet (2.5 mg total) by mouth daily.    antiseptic oral rinse (BIOTENE) LIQD 15 mLs by Mouth Rinse route every 6 (six) hours as needed for dry mouth.    aspirin EC 325 MG EC tablet Take 1 tablet (325 mg total) by mouth daily with breakfast.    D-5000 125 MCG (5000 UT) TABS Take 125 mcg by mouth daily.    insulin aspart (NOVOLOG FLEXPEN) 100 UNIT/ML FlexPen Inject 5 Units into the skin 3 (three) times daily with meals. Special Instructions: Give five units for blood sugar greater than (>) 150    insulin glargine (LANTUS SOLOSTAR) 100 UNIT/ML Solostar Pen Inject 8 Units into the  skin at bedtime.    methocarbamol (ROBAXIN) 500 MG tablet Take 500 mg by mouth at bedtime. 07/17/2021: D/Ced as on [facility]' List   mirtazapine (REMERON) 7.5 MG tablet Take 7.5 mg by mouth at bedtime.    NOVOFINE PLUS 32G X 4 MM MISC U UTD WITH TRESIBA (Patient not taking: Reported on 07/16/2021)    ondansetron (ZOFRAN) 4 MG tablet Take 1 tablet (4 mg total) by mouth every 6 (six) hours as needed for nausea.    ONETOUCH VERIO test strip USE TO SELF MONITOR BLOOD GLUCOSE 2 TIMES DAILY AND AS NEEDED (Patient not taking: Reported on 07/16/2021)    oxyCODONE (ROXICODONE) 5 MG immediate release tablet Take 1 tablet (5 mg total) by mouth daily as needed for up to 7 days for severe pain.    OXYGEN Inhale 2 L into the lungs continuous. Special Instructions: Document O2 saturation levels every shift; Day, Evening, Night    pantoprazole (PROTONIX) 40 MG tablet Take 40 mg by mouth daily.    polyethylene glycol (MIRALAX) 17 g packet Take 17 g by mouth daily.    senna-docusate (SENOKOT-S) 8.6-50 MG tablet Take 2 tablets by mouth at bedtime.    sertraline (ZOLOFT) 25 MG tablet Take 25 mg by mouth at bedtime.    UNABLE TO FIND DIET: Mechanical soft cons CHO diet    No facility-administered encounter medications on file as of 07/23/2021.     SIGNIFICANT DIAGNOSTIC EXAMS   LABS REVIEWED PREVIOUS    06-12-21: wbc 7.6; hgb 14.6; hct 45.3; mcv 97.0 plt 108; glucose 276; bun 16; creat 0.92; k+ 4.2; na++ 135; ca 9.3; GFR 58; hgb a1c 7.3; vit D 42.29 06-15-21: wbc 19.7; hgb 8.0; hct 25.5; mcv 103.2 plt 100; glucose 238; bun 48; creat 1.49; k+ 4.8; na++ 139; ca 8.2 GFR 33; AM cortisol 28.8 07-16-21: wbc 8.1; hgb 9.7; hct 30.7; mcv 95.6 plt 170; glucose 203; bun 15; creat 0.49; k+ 3.6; na++ 131; ca 8.5 GFR >60; alk phos 187; albumin 2.2 hgb a1c 6.7   NO NEW LABS.   Review of Systems  Constitutional:  Negative for malaise/fatigue.  Respiratory:  Negative for cough and shortness of breath.   Cardiovascular:  Negative for  chest pain, palpitations and leg swelling.  Gastrointestinal:  Negative for abdominal pain, constipation and heartburn.  Musculoskeletal:  Positive for joint pain. Negative for back pain and myalgias.  Skin: Negative.   Neurological:  Negative for dizziness.  Psychiatric/Behavioral:  The patient is not nervous/anxious.    Physical Exam Constitutional:  General: She is not in acute distress.    Appearance: She is well-developed. She is not diaphoretic.     Comments: Frail   Neck:     Thyroid: No thyromegaly.  Cardiovascular:     Rate and Rhythm: Normal rate and regular rhythm.     Heart sounds: Normal heart sounds.  Pulmonary:     Effort: Pulmonary effort is normal. No respiratory distress.     Comments: 02 diminished sounds  Abdominal:     General: Bowel sounds are normal. There is no distension.     Palpations: Abdomen is soft.     Tenderness: There is no abdominal tenderness.  Musculoskeletal:     Cervical back: Neck supple.     Right lower leg: No edema.     Left lower leg: No edema.     Comments: Able to move all extremities Status post IM Nail left hip 06-14-21   Lymphadenopathy:     Cervical: No cervical adenopathy.  Skin:    General: Skin is warm and dry.  Neurological:     Mental Status: She is alert. Mental status is at baseline.  Psychiatric:        Mood and Affect: Mood normal.      ASSESSMENT/ PLAN:  TODAY  Closed fracture left hip with delayed healing subsequent encounter  Will begin oxycodone 5 mg prior to therapy through 07-30-21    Ok Edwards NP [place] [phone] Monday through Friday 8am- 5pm  After hours call [phone]

## 2021-07-24 ENCOUNTER — Non-Acute Institutional Stay (SKILLED_NURSING_FACILITY): Payer: Medicare Other | Admitting: Adult Health

## 2021-07-24 ENCOUNTER — Encounter: Payer: Self-pay | Admitting: Adult Health

## 2021-07-24 ENCOUNTER — Ambulatory Visit: Payer: Medicare Other | Admitting: Podiatry

## 2021-07-24 DIAGNOSIS — R1312 Dysphagia, oropharyngeal phase: Secondary | ICD-10-CM | POA: Diagnosis not present

## 2021-07-24 DIAGNOSIS — Z4789 Encounter for other orthopedic aftercare: Secondary | ICD-10-CM | POA: Diagnosis not present

## 2021-07-24 DIAGNOSIS — R279 Unspecified lack of coordination: Secondary | ICD-10-CM | POA: Diagnosis not present

## 2021-07-24 DIAGNOSIS — M6281 Muscle weakness (generalized): Secondary | ICD-10-CM | POA: Diagnosis not present

## 2021-07-24 DIAGNOSIS — S72002G Fracture of unspecified part of neck of left femur, subsequent encounter for closed fracture with delayed healing: Secondary | ICD-10-CM

## 2021-07-24 DIAGNOSIS — S72142D Displaced intertrochanteric fracture of left femur, subsequent encounter for closed fracture with routine healing: Secondary | ICD-10-CM | POA: Diagnosis not present

## 2021-07-24 DIAGNOSIS — Z9181 History of falling: Secondary | ICD-10-CM | POA: Diagnosis not present

## 2021-07-24 DIAGNOSIS — R41841 Cognitive communication deficit: Secondary | ICD-10-CM | POA: Diagnosis not present

## 2021-07-24 NOTE — Progress Notes (Signed)
Location:  [facility] Room Number: 140 Place of Service:  SNF (31)   CODE STATUS: dnr  Allergies  Allergen Reactions   Hydrochlorothiazide Other (See Comments) and Itching    Taken from faxed notes of dr Wylene Simmer (to short stay)  THROMBOCYTOPENIA Taken from faxed notes of dr Wylene Simmer (to short stay)  THROMBOCYTOPENIA   Prilosec [Omeprazole] Diarrhea    Chief Complaint  Patient presents with   Acute Visit    Continued pain     HPI:  Staff reports that she is having continued pain despite her premed for therapy. Today she is laying in bed. She is less engaging today. Just states that she is in pain. She did have tenderness to palpation of her hip. There are no indications of infection present.   Past Medical History:  Diagnosis Date   Diabetes mellitus    Fracture 10/2012   left wrist   Fracture of left clavicle    12/12 wore a sling   GERD (gastroesophageal reflux disease)    Hypertension    Osteoporosis    Pulmonary fibrosis ([facility])     Past Surgical History:  Procedure Laterality Date   FEMUR FRACTURE SURGERY      6/08 partial hip replacement right   ganglion cyst removal     left wrist 1960   HIP FRACTURE SURGERY     1984 pin and plate placed right   HIP SURGERY     pin and plate removed 01/6566   INTRAMEDULLARY (IM) NAIL INTERTROCHANTERIC Left 06/14/2021   Procedure: INTRAMEDULLARY (IM) NAIL INTERTROCHANTRIC;  Surgeon: Vickki Hearing, MD;  Location: [facility];  Service: Orthopedics;  Laterality: Left;   ORIF ELBOW FRACTURE Right 02/18/2013   Procedure: OPEN REDUCTION INTERNAL FIXATION (ORIF) ELBOW/OLECRANON FRACTURE;  Surgeon: Nadara Mustard, MD;  Location: [facility];  Service: Orthopedics;  Laterality: Right;   ORIF ELBOW FRACTURE Left 05/09/2015   Procedure: OPEN REDUCTION INTERNAL FIXATION (ORIF) LEFT MEDIAL HUMERAL CONDYLE FRACTURE;  Surgeon: Tarry Kos, MD;  Location: [facility];  Service: Orthopedics;  Laterality: Left;    Social History    Socioeconomic History   Marital status: Widowed    Spouse name: Not on file   Number of children: Y   Years of education: Not on file   Highest education level: Not on file  Occupational History   Occupation: retired Airline pilot  Tobacco Use   Smoking status: Never   Smokeless tobacco: Never  Vaping Use   Vaping Use: Never used  Substance and Sexual Activity   Alcohol use: No   Drug use: No   Sexual activity: Not on file  Other Topics Concern   Not on file  Social History Narrative   Not on file   Social Determinants of Health   Financial Resource Strain: Not on file  Food Insecurity: Not on file  Transportation Needs: Not on file  Physical Activity: Not on file  Stress: Not on file  Social Connections: Not on file  Intimate Partner Violence: Not on file   Family History  Problem Relation Age of Onset   Pancreatic cancer Brother    Stroke Mother    Diabetes Father       VITAL SIGNS BP 110/61   Pulse 89   Temp 98.5 F (36.9 C)   Ht 5\' 1"  (1.549 m)   Wt 106 lb 3.2 oz (48.2 kg)   BMI 20.07 kg/m   Outpatient Encounter Medications as of 07/24/2021  Medication  Sig Note   acetaminophen (TYLENOL) 650 MG suppository Place 1 suppository (650 mg total) rectally every 4 (four) hours as needed for fever or mild pain.    ADVAIR DISKUS 500-50 MCG/DOSE AEPB INHALE 1 PUFF INTO THE LUNGS TWICE DAILY    amLODipine (NORVASC) 2.5 MG tablet Take 1 tablet (2.5 mg total) by mouth daily.    antiseptic oral rinse (BIOTENE) LIQD 15 mLs by Mouth Rinse route every 6 (six) hours as needed for dry mouth.    aspirin EC 325 MG EC tablet Take 1 tablet (325 mg total) by mouth daily with breakfast.    D-5000 125 MCG (5000 UT) TABS Take 125 mcg by mouth daily.    insulin aspart (NOVOLOG FLEXPEN) 100 UNIT/ML FlexPen Inject 5 Units into the skin 3 (three) times daily with meals. Special Instructions: Give five units for blood sugar greater than (>) 150    insulin glargine (LANTUS SOLOSTAR) 100  UNIT/ML Solostar Pen Inject 8 Units into the skin at bedtime.    methocarbamol (ROBAXIN) 500 MG tablet Take 500 mg by mouth at bedtime. 07/17/2021: D/Ced as on [facility]' List   mirtazapine (REMERON) 7.5 MG tablet Take 7.5 mg by mouth at bedtime.    NOVOFINE PLUS 32G X 4 MM MISC U UTD WITH TRESIBA (Patient not taking: Reported on 07/16/2021)    ondansetron (ZOFRAN) 4 MG tablet Take 1 tablet (4 mg total) by mouth every 6 (six) hours as needed for nausea.    ONETOUCH VERIO test strip USE TO SELF MONITOR BLOOD GLUCOSE 2 TIMES DAILY AND AS NEEDED (Patient not taking: Reported on 07/16/2021)    oxyCODONE (ROXICODONE) 5 MG immediate release tablet Take 1 tablet (5 mg total) by mouth daily as needed for up to 7 days for severe pain.    OXYGEN Inhale 2 L into the lungs continuous. Special Instructions: Document O2 saturation levels every shift; Day, Evening, Night    pantoprazole (PROTONIX) 40 MG tablet Take 40 mg by mouth daily.    polyethylene glycol (MIRALAX) 17 g packet Take 17 g by mouth daily.    senna-docusate (SENOKOT-S) 8.6-50 MG tablet Take 2 tablets by mouth at bedtime.    sertraline (ZOLOFT) 25 MG tablet Take 25 mg by mouth at bedtime.    UNABLE TO FIND DIET: Mechanical soft cons CHO diet    No facility-administered encounter medications on file as of 07/24/2021.     SIGNIFICANT DIAGNOSTIC EXAMS   LABS REVIEWED PREVIOUS    06-12-21: wbc 7.6; hgb 14.6; hct 45.3; mcv 97.0 plt 108; glucose 276; bun 16; creat 0.92; k+ 4.2; na++ 135; ca 9.3; GFR 58; hgb a1c 7.3; vit D 42.29 06-15-21: wbc 19.7; hgb 8.0; hct 25.5; mcv 103.2 plt 100; glucose 238; bun 48; creat 1.49; k+ 4.8; na++ 139; ca 8.2 GFR 33; AM cortisol 28.8 07-16-21: wbc 8.1; hgb 9.7; hct 30.7; mcv 95.6 plt 170; glucose 203; bun 15; creat 0.49; k+ 3.6; na++ 131; ca 8.5 GFR >60; alk phos 187; albumin 2.2 hgb a1c 6.7   NO NEW LABS.   Review of Systems  Unable to perform ROS: Medical condition   Physical Exam Constitutional:      General: She  is not in acute distress.    Appearance: She is well-developed. She is not diaphoretic.     Comments: Frail   Neck:     Thyroid: No thyromegaly.  Cardiovascular:     Rate and Rhythm: Normal rate and regular rhythm.     Pulses: Normal pulses.  Heart sounds: Normal heart sounds.  Pulmonary:     Effort: Pulmonary effort is normal. No respiratory distress.     Comments: 02 diminished sounds  Abdominal:     General: Bowel sounds are normal. There is no distension.     Palpations: Abdomen is soft.     Tenderness: There is no abdominal tenderness.  Musculoskeletal:     Cervical back: Neck supple.     Right lower leg: No edema.     Left lower leg: No edema.     Comments:  Able to move all extremities Status post IM Nail left hip 06-14-21    Lymphadenopathy:     Cervical: No cervical adenopathy.  Skin:    General: Skin is warm and dry.  Neurological:     Mental Status: She is alert. Mental status is at baseline.  Psychiatric:        Mood and Affect: Mood normal.      ASSESSMENT/ PLAN:  TODAY  Closed fracture of left hip with delayed healing subsequent encounter  Pain is worse Will begin tylenol 1 gm twice daily  Will begin lidoderm 4% daily to left hip.  Will monitor her status.    Ok Edwards NP [facility] [place] [facility]  Contact [phone] Monday through Friday 8am- 5pm  After hours call [phone]

## 2021-07-25 DIAGNOSIS — R1312 Dysphagia, oropharyngeal phase: Secondary | ICD-10-CM | POA: Diagnosis not present

## 2021-07-25 DIAGNOSIS — S72142D Displaced intertrochanteric fracture of left femur, subsequent encounter for closed fracture with routine healing: Secondary | ICD-10-CM | POA: Diagnosis not present

## 2021-07-25 DIAGNOSIS — Z4789 Encounter for other orthopedic aftercare: Secondary | ICD-10-CM | POA: Diagnosis not present

## 2021-07-25 DIAGNOSIS — M6281 Muscle weakness (generalized): Secondary | ICD-10-CM | POA: Diagnosis not present

## 2021-07-25 DIAGNOSIS — Z9181 History of falling: Secondary | ICD-10-CM | POA: Diagnosis not present

## 2021-07-25 DIAGNOSIS — R279 Unspecified lack of coordination: Secondary | ICD-10-CM | POA: Diagnosis not present

## 2021-07-25 DIAGNOSIS — R41841 Cognitive communication deficit: Secondary | ICD-10-CM | POA: Diagnosis not present

## 2021-07-29 DIAGNOSIS — M6281 Muscle weakness (generalized): Secondary | ICD-10-CM | POA: Diagnosis not present

## 2021-07-29 DIAGNOSIS — Z4789 Encounter for other orthopedic aftercare: Secondary | ICD-10-CM | POA: Diagnosis not present

## 2021-07-29 DIAGNOSIS — S72142D Displaced intertrochanteric fracture of left femur, subsequent encounter for closed fracture with routine healing: Secondary | ICD-10-CM | POA: Diagnosis not present

## 2021-07-29 DIAGNOSIS — Z9181 History of falling: Secondary | ICD-10-CM | POA: Diagnosis not present

## 2021-07-29 DIAGNOSIS — R279 Unspecified lack of coordination: Secondary | ICD-10-CM | POA: Diagnosis not present

## 2021-07-29 DIAGNOSIS — R41841 Cognitive communication deficit: Secondary | ICD-10-CM | POA: Diagnosis not present

## 2021-07-29 DIAGNOSIS — R1312 Dysphagia, oropharyngeal phase: Secondary | ICD-10-CM | POA: Diagnosis not present

## 2021-07-30 DIAGNOSIS — Z9181 History of falling: Secondary | ICD-10-CM | POA: Diagnosis not present

## 2021-07-30 DIAGNOSIS — Z4789 Encounter for other orthopedic aftercare: Secondary | ICD-10-CM | POA: Diagnosis not present

## 2021-07-30 DIAGNOSIS — R41841 Cognitive communication deficit: Secondary | ICD-10-CM | POA: Diagnosis not present

## 2021-07-30 DIAGNOSIS — M6281 Muscle weakness (generalized): Secondary | ICD-10-CM | POA: Diagnosis not present

## 2021-07-30 DIAGNOSIS — S72142D Displaced intertrochanteric fracture of left femur, subsequent encounter for closed fracture with routine healing: Secondary | ICD-10-CM | POA: Diagnosis not present

## 2021-07-30 DIAGNOSIS — R279 Unspecified lack of coordination: Secondary | ICD-10-CM | POA: Diagnosis not present

## 2021-07-30 DIAGNOSIS — R1312 Dysphagia, oropharyngeal phase: Secondary | ICD-10-CM | POA: Diagnosis not present

## 2021-07-31 ENCOUNTER — Encounter: Payer: Self-pay | Admitting: Adult Health

## 2021-07-31 ENCOUNTER — Non-Acute Institutional Stay (SKILLED_NURSING_FACILITY): Payer: Medicare Other | Admitting: Adult Health

## 2021-07-31 DIAGNOSIS — L89154 Pressure ulcer of sacral region, stage 4: Secondary | ICD-10-CM

## 2021-07-31 DIAGNOSIS — R41841 Cognitive communication deficit: Secondary | ICD-10-CM | POA: Diagnosis not present

## 2021-07-31 DIAGNOSIS — S72142D Displaced intertrochanteric fracture of left femur, subsequent encounter for closed fracture with routine healing: Secondary | ICD-10-CM | POA: Diagnosis not present

## 2021-07-31 DIAGNOSIS — Z9181 History of falling: Secondary | ICD-10-CM | POA: Diagnosis not present

## 2021-07-31 DIAGNOSIS — R1312 Dysphagia, oropharyngeal phase: Secondary | ICD-10-CM | POA: Diagnosis not present

## 2021-07-31 DIAGNOSIS — R279 Unspecified lack of coordination: Secondary | ICD-10-CM | POA: Diagnosis not present

## 2021-07-31 DIAGNOSIS — Z4789 Encounter for other orthopedic aftercare: Secondary | ICD-10-CM | POA: Diagnosis not present

## 2021-07-31 DIAGNOSIS — M6281 Muscle weakness (generalized): Secondary | ICD-10-CM | POA: Diagnosis not present

## 2021-07-31 NOTE — Progress Notes (Signed)
Location:  [place] Room Number: 140 Place of Service:  SNF (31) Ok Edwards, NP   CODE STATUS: DNR  Allergies  Allergen Reactions   Hydrochlorothiazide Other (See Comments) and Itching    Taken from faxed notes of dr Osborne Casco (to short stay)  THROMBOCYTOPENIA Taken from faxed notes of dr Osborne Casco (to short stay)  THROMBOCYTOPENIA   Prilosec [Omeprazole] Diarrhea    Chief Complaint  Patient presents with   Acute Visit    Pain management    HPI:  The nursing staff is concerned about her pain relief. She is presently taking tylenol 1 gm twice daily and has lidocaine patch to left hip. She states that she has pain "all over".   Past Medical History:  Diagnosis Date   Diabetes mellitus    Fracture 10/2012   left wrist   Fracture of left clavicle    12/12 wore a sling   GERD (gastroesophageal reflux disease)    Hypertension    Osteoporosis    Pulmonary fibrosis ([place])     Past Surgical History:  Procedure Laterality Date   FEMUR FRACTURE SURGERY      6/08 partial hip replacement right   ganglion cyst removal     left wrist 1960   HIP FRACTURE SURGERY     1984 pin and plate placed right   HIP SURGERY     pin and plate removed 05/9628   INTRAMEDULLARY (IM) NAIL INTERTROCHANTERIC Left 06/14/2021   Procedure: INTRAMEDULLARY (IM) NAIL INTERTROCHANTRIC;  Surgeon: Carole Civil, MD;  Location: [facility];  Service: Orthopedics;  Laterality: Left;   ORIF ELBOW FRACTURE Right 02/18/2013   Procedure: OPEN REDUCTION INTERNAL FIXATION (ORIF) ELBOW/OLECRANON FRACTURE;  Surgeon: Newt Minion, MD;  Location: [place];  Service: Orthopedics;  Laterality: Right;   ORIF ELBOW FRACTURE Left 05/09/2015   Procedure: OPEN REDUCTION INTERNAL FIXATION (ORIF) LEFT MEDIAL HUMERAL CONDYLE FRACTURE;  Surgeon: Leandrew Koyanagi, MD;  Location: [place];  Service: Orthopedics;  Laterality: Left;    Social History   Socioeconomic History   Marital status: Widowed    Spouse name:  Not on file   Number of children: Y   Years of education: Not on file   Highest education level: Not on file  Occupational History   Occupation: retired Optometrist  Tobacco Use   Smoking status: Never   Smokeless tobacco: Never  Vaping Use   Vaping Use: Never used  Substance and Sexual Activity   Alcohol use: No   Drug use: No   Sexual activity: Not on file  Other Topics Concern   Not on file  Social History Narrative   Not on file   Social Determinants of Health   Financial Resource Strain: Not on file  Food Insecurity: Not on file  Transportation Needs: Not on file  Physical Activity: Not on file  Stress: Not on file  Social Connections: Not on file  Intimate Partner Violence: Not on file   Family History  Problem Relation Age of Onset   Pancreatic cancer Brother    Stroke Mother    Diabetes Father       VITAL SIGNS BP 117/63   Pulse 97   Temp 98.5 F (36.9 C)   Ht $R'5\' 1"'QA$  (1.549 m)   Wt 106 lb 3.2 oz (48.2 kg)   SpO2 97%   BMI 20.07 kg/m   Outpatient Encounter Medications as of 07/31/2021  Medication Sig Note   acetaminophen (TYLENOL) 650 MG suppository Place  1 suppository (650 mg total) rectally every 4 (four) hours as needed for fever or mild pain.    ADVAIR DISKUS 500-50 MCG/DOSE AEPB INHALE 1 PUFF INTO THE LUNGS TWICE DAILY    amLODipine (NORVASC) 2.5 MG tablet Take 1 tablet (2.5 mg total) by mouth daily.    antiseptic oral rinse (BIOTENE) LIQD 15 mLs by Mouth Rinse route every 6 (six) hours as needed for dry mouth.    aspirin EC 325 MG EC tablet Take 1 tablet (325 mg total) by mouth daily with breakfast.    D-5000 125 MCG (5000 UT) TABS Take 125 mcg by mouth daily.    insulin aspart (NOVOLOG FLEXPEN) 100 UNIT/ML FlexPen Inject 5 Units into the skin 3 (three) times daily with meals. Special Instructions: Give five units for blood sugar greater than (>) 150    insulin glargine (LANTUS SOLOSTAR) 100 UNIT/ML Solostar Pen Inject 8 Units into the skin at  bedtime.    lansoprazole (PREVACID) 15 MG capsule Take 15 mg by mouth daily at 12 noon.    methocarbamol (ROBAXIN) 500 MG tablet Take 500 mg by mouth at bedtime. 07/17/2021: D/Ced as on [facility]' List   mirtazapine (REMERON) 7.5 MG tablet Take 7.5 mg by mouth at bedtime.    ondansetron (ZOFRAN) 4 MG tablet Take 1 tablet (4 mg total) by mouth every 6 (six) hours as needed for nausea.    OXYGEN Inhale 2 L into the lungs continuous. Special Instructions: Document O2 saturation levels every shift; Day, Evening, Night    polyethylene glycol (MIRALAX) 17 g packet Take 17 g by mouth daily.    senna-docusate (SENOKOT-S) 8.6-50 MG tablet Take 2 tablets by mouth at bedtime.    sertraline (ZOLOFT) 25 MG tablet Take 25 mg by mouth at bedtime.    UNABLE TO FIND DIET: Mechanical soft cons CHO diet    [DISCONTINUED] NOVOFINE PLUS 32G X 4 MM MISC U UTD WITH TRESIBA (Patient not taking: Reported on 07/16/2021)    [DISCONTINUED] ONETOUCH VERIO test strip USE TO SELF MONITOR BLOOD GLUCOSE 2 TIMES DAILY AND AS NEEDED (Patient not taking: Reported on 07/16/2021)    [DISCONTINUED] pantoprazole (PROTONIX) 40 MG tablet Take 40 mg by mouth daily.    No facility-administered encounter medications on file as of 07/31/2021.     SIGNIFICANT DIAGNOSTIC EXAMS  LABS REVIEWED PREVIOUS    06-12-21: wbc 7.6; hgb 14.6; hct 45.3; mcv 97.0 plt 108; glucose 276; bun 16; creat 0.92; k+ 4.2; na++ 135; ca 9.3; GFR 58; hgb a1c 7.3; vit D 42.29 06-15-21: wbc 19.7; hgb 8.0; hct 25.5; mcv 103.2 plt 100; glucose 238; bun 48; creat 1.49; k+ 4.8; na++ 139; ca 8.2 GFR 33; AM cortisol 28.8 07-16-21: wbc 8.1; hgb 9.7; hct 30.7; mcv 95.6 plt 170; glucose 203; bun 15; creat 0.49; k+ 3.6; na++ 131; ca 8.5 GFR >60; alk phos 187; albumin 2.2 hgb a1c 6.7   NO NEW LABS.   Review of Systems  Unable to perform ROS: Mental acuity   Physical Exam Constitutional:      General: She is not in acute distress.    Appearance: She is well-developed. She is not  diaphoretic.  Neck:     Thyroid: No thyromegaly.  Cardiovascular:     Rate and Rhythm: Normal rate and regular rhythm.     Pulses: Normal pulses.     Heart sounds: Normal heart sounds.  Pulmonary:     Effort: Pulmonary effort is normal. No respiratory distress.  Breath sounds: Normal breath sounds.     Comments: 02 Abdominal:     General: Bowel sounds are normal. There is no distension.     Palpations: Abdomen is soft.     Tenderness: There is no abdominal tenderness.  Musculoskeletal:     Cervical back: Neck supple.     Right lower leg: No edema.     Left lower leg: No edema.     Comments: Able to move all extremities Status post IM Nail left hip 06-14-21     Lymphadenopathy:     Cervical: No cervical adenopathy.  Skin:    General: Skin is warm and dry.     Comments: Stage IV: 3 x 4 x 1.6 cm with 1.6 cm undermining   Neurological:     Mental Status: She is alert. Mental status is at baseline.  Psychiatric:        Mood and Affect: Mood normal.      ASSESSMENT/ PLAN:  TODAY  Stage IV pressure ulcer or sacral area:   Will use lidocaine gel around wound edges Will change to tylenol 1 gm three times daily  Will stop robaxin; as this medication is not providing pain relief.    Ok Edwards NP [facility]  Contact [phone] Monday through Friday 8am- 5pm  After hours call [phone]

## 2021-08-01 ENCOUNTER — Non-Acute Institutional Stay (SKILLED_NURSING_FACILITY): Payer: Medicare Other | Admitting: Adult Health

## 2021-08-01 ENCOUNTER — Encounter: Payer: Self-pay | Admitting: Adult Health

## 2021-08-01 DIAGNOSIS — Z9181 History of falling: Secondary | ICD-10-CM | POA: Diagnosis not present

## 2021-08-01 DIAGNOSIS — S72142D Displaced intertrochanteric fracture of left femur, subsequent encounter for closed fracture with routine healing: Secondary | ICD-10-CM | POA: Diagnosis not present

## 2021-08-01 DIAGNOSIS — I700 Atherosclerosis of aorta: Secondary | ICD-10-CM | POA: Diagnosis not present

## 2021-08-01 DIAGNOSIS — R41841 Cognitive communication deficit: Secondary | ICD-10-CM | POA: Diagnosis not present

## 2021-08-01 DIAGNOSIS — S72002G Fracture of unspecified part of neck of left femur, subsequent encounter for closed fracture with delayed healing: Secondary | ICD-10-CM | POA: Diagnosis not present

## 2021-08-01 DIAGNOSIS — M6281 Muscle weakness (generalized): Secondary | ICD-10-CM | POA: Diagnosis not present

## 2021-08-01 DIAGNOSIS — Z4789 Encounter for other orthopedic aftercare: Secondary | ICD-10-CM | POA: Diagnosis not present

## 2021-08-01 DIAGNOSIS — E43 Unspecified severe protein-calorie malnutrition: Secondary | ICD-10-CM

## 2021-08-01 DIAGNOSIS — R627 Adult failure to thrive: Secondary | ICD-10-CM | POA: Diagnosis not present

## 2021-08-01 DIAGNOSIS — R279 Unspecified lack of coordination: Secondary | ICD-10-CM | POA: Diagnosis not present

## 2021-08-01 DIAGNOSIS — R1312 Dysphagia, oropharyngeal phase: Secondary | ICD-10-CM | POA: Diagnosis not present

## 2021-08-01 NOTE — Progress Notes (Signed)
Location:  [place] Room Number: 140-D Place of Service:  SNF (31)   CODE STATUS: DNR  Allergies  Allergen Reactions   Hydrochlorothiazide Other (See Comments) and Itching    Taken from faxed notes of dr Osborne Casco (to short stay)  THROMBOCYTOPENIA Taken from faxed notes of dr Osborne Casco (to short stay)  THROMBOCYTOPENIA   Prilosec [Omeprazole] Diarrhea    Chief Complaint  Patient presents with   Acute Visit    Care plan meeting    HPI:  We have come together for her care plan meeting. Family present  BIMS 10/15; Mood 4/30: decreased energy; depression. She is nonambulatory. She requires extensive to dependent for her adl care. She requires to be fed her meals. She is incontinent of bladder and bowel. She had a fall prior to her admission and suffered a left hip fracture. She has a stage IV wound which she had upon admission. She is on chronic 02. She continues to have pain management issues. She has pain all over; and is unable to tolerate therapy due to the pain. . Therapy minimal assist with bed mobility; max for stand; poor sitting tolerance. Dietary weight is 106.2 pounds has poor appetite puree diet. She continues to be followed for her chronic illnesses including:  Failure to thrive in adult   Aortic atherosclerosis  Closed fracture of left hip with delayed healing subsequent encounter  Severe protein calorie malnutrition  Past Medical History:  Diagnosis Date   Diabetes mellitus    Fracture 10/2012   left wrist   Fracture of left clavicle    12/12 wore a sling   GERD (gastroesophageal reflux disease)    Hypertension    Osteoporosis    Pulmonary fibrosis ([place])     Past Surgical History:  Procedure Laterality Date   FEMUR FRACTURE SURGERY      6/08 partial hip replacement right   ganglion cyst removal     left wrist 1960   HIP FRACTURE SURGERY     1984 pin and plate placed right   HIP SURGERY     pin and plate removed 12/9415   INTRAMEDULLARY  (IM) NAIL INTERTROCHANTERIC Left 06/14/2021   Procedure: INTRAMEDULLARY (IM) NAIL INTERTROCHANTRIC;  Surgeon: Carole Civil, MD;  Location: [facility];  Service: Orthopedics;  Laterality: Left;   ORIF ELBOW FRACTURE Right 02/18/2013   Procedure: OPEN REDUCTION INTERNAL FIXATION (ORIF) ELBOW/OLECRANON FRACTURE;  Surgeon: Newt Minion, MD;  Location: [place];  Service: Orthopedics;  Laterality: Right;   ORIF ELBOW FRACTURE Left 05/09/2015   Procedure: OPEN REDUCTION INTERNAL FIXATION (ORIF) LEFT MEDIAL HUMERAL CONDYLE FRACTURE;  Surgeon: Leandrew Koyanagi, MD;  Location: [facility];  Service: Orthopedics;  Laterality: Left;    Social History   Socioeconomic History   Marital status: Widowed    Spouse name: Not on file   Number of children: Y   Years of education: Not on file   Highest education level: Not on file  Occupational History   Occupation: retired Optometrist  Tobacco Use   Smoking status: Never   Smokeless tobacco: Never  Vaping Use   Vaping Use: Never used  Substance and Sexual Activity   Alcohol use: No   Drug use: No   Sexual activity: Not on file  Other Topics Concern   Not on file  Social History Narrative   Not on file   Social Determinants of Health   Financial Resource Strain: Not on file  Food Insecurity: Not on file  Transportation Needs: Not on file  Physical Activity: Not on file  Stress: Not on file  Social Connections: Not on file  Intimate Partner Violence: Not on file   Family History  Problem Relation Age of Onset   Pancreatic cancer Brother    Stroke Mother    Diabetes Father       VITAL SIGNS BP 117/63   Pulse 97   Temp 98.5 F (36.9 C)   Resp (!) 22   Ht _0  (1.549 m)   Wt 106 lb 3.2 oz (48.2 kg)   SpO2 96%   BMI 20.07 kg/m   Outpatient Encounter Medications as of 08/01/2021  Medication Sig Note   acetaminophen (TYLENOL) 650 MG suppository Place 1 suppository (650 mg total) rectally every 4 (four) hours as needed for fever or mild pain.     ADVAIR DISKUS 500-50 MCG/DOSE AEPB INHALE 1 PUFF INTO THE LUNGS TWICE DAILY    amLODipine (NORVASC) 2.5 MG tablet Take 1 tablet (2.5 mg total) by mouth daily.    antiseptic oral rinse (BIOTENE) LIQD 15 mLs by Mouth Rinse route every 6 (six) hours as needed for dry mouth.    aspirin EC 325 MG EC tablet Take 1 tablet (325 mg total) by mouth daily with breakfast.    D-5000 125 MCG (5000 UT) TABS Take 125 mcg by mouth daily.    insulin aspart (NOVOLOG FLEXPEN) 100 UNIT/ML FlexPen Inject 5 Units into the skin 3 (three) times daily with meals. Special Instructions: Give five units for blood sugar greater than (>) 150    insulin glargine (LANTUS SOLOSTAR) 100 UNIT/ML Solostar Pen Inject 8 Units into the skin at bedtime.    lansoprazole (PREVACID) 15 MG capsule Take 15 mg by mouth daily at 12 noon.    Lidocaine 4 % PTCH Apply topically. apply to left hip daily and remove at HS. [DX: Displaced intertrochanteric fracture of left femur, subsequent encounter for closed fracture with routine healing]    methocarbamol (ROBAXIN) 500 MG tablet Take 500 mg by mouth at bedtime. 07/17/2021: D/Ced as on [facility]   mirtazapine (REMERON) 7.5 MG tablet Take 7.5 mg by mouth at bedtime.    ondansetron (ZOFRAN) 4 MG tablet Take 1 tablet (4 mg total) by mouth every 6 (six) hours as needed for nausea.    OXYGEN Inhale 2 L into the lungs continuous. Special Instructions: Document O2 saturation levels every shift; Day, Evening, Night    polyethylene glycol (MIRALAX) 17 g packet Take 17 g by mouth daily.    senna-docusate (SENOKOT-S) 8.6-50 MG tablet Take 2 tablets by mouth at bedtime.    sertraline (ZOLOFT) 25 MG tablet Take 25 mg by mouth at bedtime.    UNABLE TO FIND Diet: Pureed diet with Nectar thick liquids CHO diet. Pt may have Glucerna and use straws.    No facility-administered encounter medications on file as of 08/01/2021.     SIGNIFICANT DIAGNOSTIC EXAMS   LABS REVIEWED PREVIOUS    06-12-21: wbc 7.6; hgb  14.6; hct 45.3; mcv 97.0 plt 108; glucose 276; bun 16; creat 0.92; k+ 4.2; na++ 135; ca 9.3; GFR 58; hgb a1c 7.3; vit D 42.29 06-15-21: wbc 19.7; hgb 8.0; hct 25.5; mcv 103.2 plt 100; glucose 238; bun 48; creat 1.49; k+ 4.8; na++ 139; ca 8.2 GFR 33; AM cortisol 28.8 07-16-21: wbc 8.1; hgb 9.7; hct 30.7; mcv 95.6 plt 170; glucose 203; bun 15; creat 0.49; k+ 3.6; na++ 131; ca 8.5 GFR >60; alk phos 187;  albumin 2.2 hgb a1c 6.7   NO NEW LABS.   Review of Systems  Unable to perform ROS: Medical condition  Musculoskeletal:  Positive for joint pain.       Pain all over    Physical Exam Constitutional:      General: She is not in acute distress.    Appearance: She is underweight. She is not diaphoretic.  Neck:     Thyroid: No thyromegaly.  Cardiovascular:     Rate and Rhythm: Normal rate and regular rhythm.     Heart sounds: Normal heart sounds.  Pulmonary:     Effort: Pulmonary effort is normal. No respiratory distress.     Comments: 02 diminished sounds  Abdominal:     General: Bowel sounds are normal. There is no distension.     Palpations: Abdomen is soft.     Tenderness: There is no abdominal tenderness.  Musculoskeletal:     Cervical back: Neck supple.     Right lower leg: No edema.     Left lower leg: No edema.     Comments: Able to move all extremities Status post IM Nail left hip 06-14-21     Lymphadenopathy:     Cervical: No cervical adenopathy.  Skin:    General: Skin is warm and dry.     Comments: Stage IV: 3 x 4 x 1.6 cm with undermining 1.6 cm.   Neurological:     Mental Status: She is alert. Mental status is at baseline.  Psychiatric:        Mood and Affect: Mood normal.     ASSESSMENT/ PLAN:  TODAY  Failure to thrive in adult Aortic atherosclerosis Closed fracture of left hip with delayed healing subsequent encounter Severe protein calorie malnutrition  Will continue current medications Will continue current plan of care This is a long term placement for  her Will continue to monitor her status.    Time spent with patient: 40 minutes: medications; health status; therapy needs goals.     Ok Edwards NP [facility]  Contact [phone] Monday through Friday 8am- 5pm  After hours call [phone]

## 2021-08-01 DEATH — deceased

## 2021-08-02 DIAGNOSIS — Z4789 Encounter for other orthopedic aftercare: Secondary | ICD-10-CM | POA: Diagnosis not present

## 2021-08-02 DIAGNOSIS — R41841 Cognitive communication deficit: Secondary | ICD-10-CM | POA: Diagnosis not present

## 2021-08-02 DIAGNOSIS — R279 Unspecified lack of coordination: Secondary | ICD-10-CM | POA: Diagnosis not present

## 2021-08-02 DIAGNOSIS — R1312 Dysphagia, oropharyngeal phase: Secondary | ICD-10-CM | POA: Diagnosis not present

## 2021-08-02 DIAGNOSIS — Z9181 History of falling: Secondary | ICD-10-CM | POA: Diagnosis not present

## 2021-08-02 DIAGNOSIS — S72142D Displaced intertrochanteric fracture of left femur, subsequent encounter for closed fracture with routine healing: Secondary | ICD-10-CM | POA: Diagnosis not present

## 2021-08-02 DIAGNOSIS — M6281 Muscle weakness (generalized): Secondary | ICD-10-CM | POA: Diagnosis not present

## 2021-08-05 DIAGNOSIS — M6281 Muscle weakness (generalized): Secondary | ICD-10-CM | POA: Diagnosis not present

## 2021-08-05 DIAGNOSIS — Z9181 History of falling: Secondary | ICD-10-CM | POA: Diagnosis not present

## 2021-08-05 DIAGNOSIS — S72142D Displaced intertrochanteric fracture of left femur, subsequent encounter for closed fracture with routine healing: Secondary | ICD-10-CM | POA: Diagnosis not present

## 2021-08-05 DIAGNOSIS — R41841 Cognitive communication deficit: Secondary | ICD-10-CM | POA: Diagnosis not present

## 2021-08-05 DIAGNOSIS — R1312 Dysphagia, oropharyngeal phase: Secondary | ICD-10-CM | POA: Diagnosis not present

## 2021-08-05 DIAGNOSIS — R279 Unspecified lack of coordination: Secondary | ICD-10-CM | POA: Diagnosis not present

## 2021-08-05 DIAGNOSIS — Z4789 Encounter for other orthopedic aftercare: Secondary | ICD-10-CM | POA: Diagnosis not present

## 2021-08-06 ENCOUNTER — Encounter: Payer: Self-pay | Admitting: Adult Health

## 2021-08-06 ENCOUNTER — Non-Acute Institutional Stay (SKILLED_NURSING_FACILITY): Payer: Medicare Other | Admitting: Adult Health

## 2021-08-06 DIAGNOSIS — Z4789 Encounter for other orthopedic aftercare: Secondary | ICD-10-CM | POA: Diagnosis not present

## 2021-08-06 DIAGNOSIS — R41841 Cognitive communication deficit: Secondary | ICD-10-CM | POA: Diagnosis not present

## 2021-08-06 DIAGNOSIS — S72142D Displaced intertrochanteric fracture of left femur, subsequent encounter for closed fracture with routine healing: Secondary | ICD-10-CM | POA: Diagnosis not present

## 2021-08-06 DIAGNOSIS — Z9181 History of falling: Secondary | ICD-10-CM | POA: Diagnosis not present

## 2021-08-06 DIAGNOSIS — M6281 Muscle weakness (generalized): Secondary | ICD-10-CM | POA: Diagnosis not present

## 2021-08-06 DIAGNOSIS — R1312 Dysphagia, oropharyngeal phase: Secondary | ICD-10-CM | POA: Diagnosis not present

## 2021-08-06 DIAGNOSIS — R279 Unspecified lack of coordination: Secondary | ICD-10-CM | POA: Diagnosis not present

## 2021-08-06 DIAGNOSIS — L03114 Cellulitis of left upper limb: Secondary | ICD-10-CM

## 2021-08-06 NOTE — Progress Notes (Signed)
Location:  [place] Room Number: 140-D Place of Service:  SNF (31)   CODE STATUS: DNR  Allergies  Allergen Reactions   Hydrochlorothiazide Other (See Comments) and Itching    Taken from faxed notes of dr Osborne Casco (to short stay)  THROMBOCYTOPENIA Taken from faxed notes of dr Osborne Casco (to short stay)  THROMBOCYTOPENIA   Prilosec [Omeprazole] Diarrhea    Chief Complaint  Patient presents with   Acute Visit    Left arm infection     HPI:  I have been asked to see her regarding her left arm. She has an area on her left forearm which is red; inflamed; warm and tender to touch. There are no reports of fevers present. The NP on call this past weekend did start her on doxycycline.   Past Medical History:  Diagnosis Date   Diabetes mellitus    Fracture 10/2012   left wrist   Fracture of left clavicle    12/12 wore a sling   GERD (gastroesophageal reflux disease)    Hypertension    Osteoporosis    Pulmonary fibrosis ([place])     Past Surgical History:  Procedure Laterality Date   FEMUR FRACTURE SURGERY      6/08 partial hip replacement right   ganglion cyst removal     left wrist 1960   HIP FRACTURE SURGERY     1984 pin and plate placed right   HIP SURGERY     pin and plate removed 01/8755   INTRAMEDULLARY (IM) NAIL INTERTROCHANTERIC Left 06/14/2021   Procedure: INTRAMEDULLARY (IM) NAIL INTERTROCHANTRIC;  Surgeon: Carole Civil, MD;  Location: [facility];  Service: Orthopedics;  Laterality: Left;   ORIF ELBOW FRACTURE Right 02/18/2013   Procedure: OPEN REDUCTION INTERNAL FIXATION (ORIF) ELBOW/OLECRANON FRACTURE;  Surgeon: Newt Minion, MD;  Location: [place];  Service: Orthopedics;  Laterality: Right;   ORIF ELBOW FRACTURE Left 05/09/2015   Procedure: OPEN REDUCTION INTERNAL FIXATION (ORIF) LEFT MEDIAL HUMERAL CONDYLE FRACTURE;  Surgeon: Leandrew Koyanagi, MD;  Location: [place];  Service: Orthopedics;  Laterality: Left;    Social History   Socioeconomic  History   Marital status: Widowed    Spouse name: Not on file   Number of children: Y   Years of education: Not on file   Highest education level: Not on file  Occupational History   Occupation: retired Optometrist  Tobacco Use   Smoking status: Never   Smokeless tobacco: Never  Vaping Use   Vaping Use: Never used  Substance and Sexual Activity   Alcohol use: No   Drug use: No   Sexual activity: Not on file  Other Topics Concern   Not on file  Social History Narrative   Not on file   Social Determinants of Health   Financial Resource Strain: Not on file  Food Insecurity: Not on file  Transportation Needs: Not on file  Physical Activity: Not on file  Stress: Not on file  Social Connections: Not on file  Intimate Partner Violence: Not on file   Family History  Problem Relation Age of Onset   Pancreatic cancer Brother    Stroke Mother    Diabetes Father       VITAL SIGNS BP (!) 117/55   Pulse 86   Temp 98.5 F (36.9 C)   Resp (!) 22   Ht $R'5\' 1"'sE$  (1.549 m)   Wt 99 lb 12.8 oz (45.3 kg)   SpO2 94%   BMI 18.86 kg/m  Outpatient Encounter Medications as of 08/06/2021  Medication Sig   acetaminophen (TYLENOL) 500 MG tablet Take 1,000 mg by mouth 3 (three) times daily. 9 am , 2 pm, and 9 pm   ADVAIR DISKUS 500-50 MCG/DOSE AEPB INHALE 1 PUFF INTO THE LUNGS TWICE DAILY   Amino Acids-Protein Hydrolys (FEEDING SUPPLEMENT, PRO-STAT SUGAR FREE 64,) LIQD Take 30 mLs by mouth 3 (three) times daily with meals. To promote wound healing and for poor meal intake   amLODipine (NORVASC) 2.5 MG tablet Take 1 tablet (2.5 mg total) by mouth daily.   antiseptic oral rinse (BIOTENE) LIQD 15 mLs by Mouth Rinse route every 6 (six) hours as needed for dry mouth.   aspirin EC 81 MG tablet Take 81 mg by mouth daily. Swallow whole.   D-5000 125 MCG (5000 UT) TABS Take 125 mcg by mouth daily.   doxycycline (VIBRA-TABS) 100 MG tablet Take 100 mg by mouth 2 (two) times daily.   DULoxetine  (CYMBALTA) 20 MG capsule Take 20 mg by mouth daily.   insulin glargine (LANTUS SOLOSTAR) 100 UNIT/ML Solostar Pen Inject 8 Units into the skin at bedtime.   insulin lispro (HUMALOG) 100 UNIT/ML injection Inject 5 Units into the skin 3 (three) times daily before meals. 7 am, 11:30 am, and 6 pm, Special Instructions: GIVE FOR CBG > 150. PRIME WITH 2 UNITS BEFORE EACH INJECTION. AFTER EACH INJECTION, KEEP NEEDLE IN SKIN FOR AT LEAST 5 SECONDS AFTER THE COUNTER SHOWS ZERO   lansoprazole (PREVACID) 15 MG capsule Take 15 mg by mouth daily at 12 noon.   Lidocaine 4 % PTCH Apply topically. apply to left hip daily and remove at HS. [DX: Displaced intertrochanteric fracture of left femur, subsequent encounter for closed fracture with routine healing]   methocarbamol (ROBAXIN) 500 MG tablet Take 500 mg by mouth at bedtime.   mirtazapine (REMERON) 7.5 MG tablet Take 7.5 mg by mouth at bedtime.   ondansetron (ZOFRAN) 4 MG tablet Take 1 tablet (4 mg total) by mouth every 6 (six) hours as needed for nausea.   OXYGEN Inhale 2 L into the lungs continuous. Special Instructions: Document O2 saturation levels every shift; Day, Evening, Night   polyethylene glycol (MIRALAX) 17 g packet Take 17 g by mouth daily.   senna-docusate (SENOKOT-S) 8.6-50 MG tablet Take 2 tablets by mouth at bedtime.   sertraline (ZOLOFT) 25 MG tablet Take 25 mg by mouth at bedtime.   UNABLE TO FIND Diet: Pureed diet with Nectar thick liquids CHO diet. Pt may have Glucerna and use straws.   [DISCONTINUED] acetaminophen (TYLENOL) 650 MG suppository Place 1 suppository (650 mg total) rectally every 4 (four) hours as needed for fever or mild pain.   [DISCONTINUED] aspirin EC 325 MG EC tablet Take 1 tablet (325 mg total) by mouth daily with breakfast.   [DISCONTINUED] insulin aspart (NOVOLOG FLEXPEN) 100 UNIT/ML FlexPen Inject 5 Units into the skin 3 (three) times daily with meals. Special Instructions: Give five units for blood sugar greater than  (>) 150   No facility-administered encounter medications on file as of 08/06/2021.     SIGNIFICANT DIAGNOSTIC EXAMS   LABS REVIEWED PREVIOUS    06-12-21: wbc 7.6; hgb 14.6; hct 45.3; mcv 97.0 plt 108; glucose 276; bun 16; creat 0.92; k+ 4.2; na++ 135; ca 9.3; GFR 58; hgb a1c 7.3; vit D 42.29 06-15-21: wbc 19.7; hgb 8.0; hct 25.5; mcv 103.2 plt 100; glucose 238; bun 48; creat 1.49; k+ 4.8; na++ 139; ca 8.2 GFR 33; AM  cortisol 28.8 07-16-21: wbc 8.1; hgb 9.7; hct 30.7; mcv 95.6 plt 170; glucose 203; bun 15; creat 0.49; k+ 3.6; na++ 131; ca 8.5 GFR >60; alk phos 187; albumin 2.2 hgb a1c 6.7   NO NEW LABS.   Review of Systems  Constitutional:  Negative for malaise/fatigue.  Respiratory:  Negative for cough and shortness of breath.   Cardiovascular:  Negative for chest pain, palpitations and leg swelling.  Gastrointestinal:  Negative for abdominal pain, constipation and heartburn.  Musculoskeletal:  Negative for back pain, joint pain and myalgias.  Skin: Negative.   Neurological:  Negative for dizziness.  Psychiatric/Behavioral:  The patient is not nervous/anxious.    Physical Exam Constitutional:      General: She is not in acute distress.    Appearance: She is well-developed. She is not diaphoretic.  Neck:     Thyroid: No thyromegaly.  Cardiovascular:     Rate and Rhythm: Normal rate and regular rhythm.     Pulses: Normal pulses.     Heart sounds: Normal heart sounds.  Pulmonary:     Effort: Pulmonary effort is normal. No respiratory distress.     Breath sounds: Normal breath sounds.     Comments: 02 Abdominal:     General: Bowel sounds are normal. There is no distension.     Palpations: Abdomen is soft.     Tenderness: There is no abdominal tenderness.  Musculoskeletal:     Cervical back: Neck supple.     Right lower leg: No edema.     Left lower leg: No edema.     Comments: Able to move all extremities Status post IM Nail left hip 06-14-21   Lymphadenopathy:     Cervical:  No cervical adenopathy.  Skin:    General: Skin is warm and dry.     Comments: Stage IV: sacrum: 3 x 3.8 x 1.4 cm 1.6 cm undermining   Neurological:     Mental Status: She is alert. Mental status is at baseline.  Psychiatric:        Mood and Affect: Mood normal.     ASSESSMENT/ PLAN:  TODAY  Left arm cellulitis: is without change will continue doxycycline 100 mg twice daily through 08-19-21    Ok Edwards NP [facility]  Contact [phone] Monday through Friday 8am- 5pm  After hours call [phone]

## 2021-08-07 DIAGNOSIS — L89154 Pressure ulcer of sacral region, stage 4: Secondary | ICD-10-CM | POA: Insufficient documentation

## 2021-08-07 DIAGNOSIS — R41841 Cognitive communication deficit: Secondary | ICD-10-CM | POA: Diagnosis not present

## 2021-08-07 DIAGNOSIS — Z4789 Encounter for other orthopedic aftercare: Secondary | ICD-10-CM | POA: Diagnosis not present

## 2021-08-07 DIAGNOSIS — S72142D Displaced intertrochanteric fracture of left femur, subsequent encounter for closed fracture with routine healing: Secondary | ICD-10-CM | POA: Diagnosis not present

## 2021-08-07 DIAGNOSIS — M6281 Muscle weakness (generalized): Secondary | ICD-10-CM | POA: Diagnosis not present

## 2021-08-07 DIAGNOSIS — Z9181 History of falling: Secondary | ICD-10-CM | POA: Diagnosis not present

## 2021-08-07 DIAGNOSIS — R279 Unspecified lack of coordination: Secondary | ICD-10-CM | POA: Diagnosis not present

## 2021-08-07 DIAGNOSIS — R1312 Dysphagia, oropharyngeal phase: Secondary | ICD-10-CM | POA: Diagnosis not present

## 2021-08-08 DIAGNOSIS — Z9181 History of falling: Secondary | ICD-10-CM | POA: Diagnosis not present

## 2021-08-08 DIAGNOSIS — R279 Unspecified lack of coordination: Secondary | ICD-10-CM | POA: Diagnosis not present

## 2021-08-08 DIAGNOSIS — R41841 Cognitive communication deficit: Secondary | ICD-10-CM | POA: Diagnosis not present

## 2021-08-08 DIAGNOSIS — R1312 Dysphagia, oropharyngeal phase: Secondary | ICD-10-CM | POA: Diagnosis not present

## 2021-08-08 DIAGNOSIS — S72142D Displaced intertrochanteric fracture of left femur, subsequent encounter for closed fracture with routine healing: Secondary | ICD-10-CM | POA: Diagnosis not present

## 2021-08-08 DIAGNOSIS — Z4789 Encounter for other orthopedic aftercare: Secondary | ICD-10-CM | POA: Diagnosis not present

## 2021-08-08 DIAGNOSIS — M6281 Muscle weakness (generalized): Secondary | ICD-10-CM | POA: Diagnosis not present

## 2021-08-09 ENCOUNTER — Non-Acute Institutional Stay (SKILLED_NURSING_FACILITY): Payer: Medicare Other | Admitting: Adult Health

## 2021-08-09 ENCOUNTER — Encounter: Payer: Self-pay | Admitting: Adult Health

## 2021-08-09 DIAGNOSIS — K219 Gastro-esophageal reflux disease without esophagitis: Secondary | ICD-10-CM | POA: Diagnosis not present

## 2021-08-09 DIAGNOSIS — E43 Unspecified severe protein-calorie malnutrition: Secondary | ICD-10-CM | POA: Diagnosis not present

## 2021-08-09 DIAGNOSIS — E1159 Type 2 diabetes mellitus with other circulatory complications: Secondary | ICD-10-CM

## 2021-08-09 DIAGNOSIS — R41841 Cognitive communication deficit: Secondary | ICD-10-CM | POA: Diagnosis not present

## 2021-08-09 DIAGNOSIS — S72142D Displaced intertrochanteric fracture of left femur, subsequent encounter for closed fracture with routine healing: Secondary | ICD-10-CM | POA: Diagnosis not present

## 2021-08-09 DIAGNOSIS — Z9181 History of falling: Secondary | ICD-10-CM | POA: Diagnosis not present

## 2021-08-09 DIAGNOSIS — I152 Hypertension secondary to endocrine disorders: Secondary | ICD-10-CM

## 2021-08-09 DIAGNOSIS — R279 Unspecified lack of coordination: Secondary | ICD-10-CM | POA: Diagnosis not present

## 2021-08-09 DIAGNOSIS — E1122 Type 2 diabetes mellitus with diabetic chronic kidney disease: Secondary | ICD-10-CM | POA: Diagnosis not present

## 2021-08-09 DIAGNOSIS — N182 Chronic kidney disease, stage 2 (mild): Secondary | ICD-10-CM

## 2021-08-09 DIAGNOSIS — R1312 Dysphagia, oropharyngeal phase: Secondary | ICD-10-CM | POA: Diagnosis not present

## 2021-08-09 DIAGNOSIS — M6281 Muscle weakness (generalized): Secondary | ICD-10-CM | POA: Diagnosis not present

## 2021-08-09 DIAGNOSIS — I129 Hypertensive chronic kidney disease with stage 1 through stage 4 chronic kidney disease, or unspecified chronic kidney disease: Secondary | ICD-10-CM

## 2021-08-09 DIAGNOSIS — Z4789 Encounter for other orthopedic aftercare: Secondary | ICD-10-CM | POA: Diagnosis not present

## 2021-08-09 NOTE — Progress Notes (Signed)
Location:  [place] Room Number: 140-D Place of Service:  SNF (31)   CODE STATUS: DNR  Allergies  Allergen Reactions   Hydrochlorothiazide Other (See Comments) and Itching    Taken from faxed notes of dr Osborne Casco (to short stay)  THROMBOCYTOPENIA Taken from faxed notes of dr Osborne Casco (to short stay)  THROMBOCYTOPENIA   Prilosec [Omeprazole] Diarrhea    Chief Complaint  Patient presents with   Medical Management of Chronic Issues             Type 2 diabetes with CKD stage 2 and hypertension: . Hypertension associated with type 2 diabetes mellitus:  Gastroesophageal reflux disease without esophagitis:  Severe protein calorie malnutrition     HPI:  She is a 87 year old long term resident of this facility being seen for the management of her chronic illnesses: Type 2 diabetes with CKD stage 2 and hypertension: . Hypertension associated with type 2 diabetes mellitus:  Gastroesophageal reflux disease without esophagitis:  Severe protein calorie malnutrition. There are no reports of uncontrolled pain. There are no reports of agitation or depressive thoughts.   Past Medical History:  Diagnosis Date   Diabetes mellitus    Fracture 10/2012   left wrist   Fracture of left clavicle    12/12 wore a sling   GERD (gastroesophageal reflux disease)    Hypertension    Osteoporosis    Pulmonary fibrosis ([place])     Past Surgical History:  Procedure Laterality Date   FEMUR FRACTURE SURGERY      6/08 partial hip replacement right   ganglion cyst removal     left wrist 1960   HIP FRACTURE SURGERY     1984 pin and plate placed right   HIP SURGERY     pin and plate removed 03/5624   INTRAMEDULLARY (IM) NAIL INTERTROCHANTERIC Left 06/14/2021   Procedure: INTRAMEDULLARY (IM) NAIL INTERTROCHANTRIC;  Surgeon: Carole Civil, MD;  Location: [facility];  Service: Orthopedics;  Laterality: Left;   ORIF ELBOW FRACTURE Right 02/18/2013   Procedure: OPEN REDUCTION INTERNAL  FIXATION (ORIF) ELBOW/OLECRANON FRACTURE;  Surgeon: Newt Minion, MD;  Location: [facility];  Service: Orthopedics;  Laterality: Right;   ORIF ELBOW FRACTURE Left 05/09/2015   Procedure: OPEN REDUCTION INTERNAL FIXATION (ORIF) LEFT MEDIAL HUMERAL CONDYLE FRACTURE;  Surgeon: Leandrew Koyanagi, MD;  Location: [facility];  Service: Orthopedics;  Laterality: Left;    Social History   Socioeconomic History   Marital status: Widowed    Spouse name: Not on file   Number of children: Y   Years of education: Not on file   Highest education level: Not on file  Occupational History   Occupation: retired Optometrist  Tobacco Use   Smoking status: Never   Smokeless tobacco: Never  Vaping Use   Vaping Use: Never used  Substance and Sexual Activity   Alcohol use: No   Drug use: No   Sexual activity: Not on file  Other Topics Concern   Not on file  Social History Narrative   Not on file   Social Determinants of Health   Financial Resource Strain: Not on file  Food Insecurity: Not on file  Transportation Needs: Not on file  Physical Activity: Not on file  Stress: Not on file  Social Connections: Not on file  Intimate Partner Violence: Not on file   Family History  Problem Relation Age of Onset   Pancreatic cancer Brother    Stroke Mother  Diabetes Father       VITAL SIGNS BP (!) 117/55   Pulse 86   Temp 98.5 F (36.9 C)   Resp (!) 22   Ht _0  (1.549 m)   Wt 99 lb 12.8 oz (45.3 kg)   SpO2 96%   BMI 18.86 kg/m   Outpatient Encounter Medications as of 08/09/2021  Medication Sig   acetaminophen (TYLENOL) 500 MG tablet Take 1,000 mg by mouth 3 (three) times daily. 9 am , 2 pm, and 9 pm   ADVAIR DISKUS 500-50 MCG/DOSE AEPB INHALE 1 PUFF INTO THE LUNGS TWICE DAILY   Amino Acids-Protein Hydrolys (FEEDING SUPPLEMENT, PRO-STAT SUGAR FREE 64,) LIQD Take 30 mLs by mouth 3 (three) times daily with meals. To promote wound healing and for poor meal intake   amLODipine (NORVASC) 2.5 MG tablet Take 1  tablet (2.5 mg total) by mouth daily.   antiseptic oral rinse (BIOTENE) LIQD 15 mLs by Mouth Rinse route every 6 (six) hours as needed for dry mouth.   aspirin EC 81 MG tablet Take 81 mg by mouth daily. Swallow whole.   D-5000 125 MCG (5000 UT) TABS Take 125 mcg by mouth daily.   doxycycline (VIBRA-TABS) 100 MG tablet Take 100 mg by mouth 2 (two) times daily.   DULoxetine (CYMBALTA) 20 MG capsule Take 20 mg by mouth daily.   insulin glargine (LANTUS SOLOSTAR) 100 UNIT/ML Solostar Pen Inject 8 Units into the skin at bedtime.   insulin lispro (HUMALOG) 100 UNIT/ML injection Inject 5 Units into the skin 3 (three) times daily before meals. 7 am, 11:30 am, and 6 pm, Special Instructions: GIVE FOR CBG > 150. PRIME WITH 2 UNITS BEFORE EACH INJECTION. AFTER EACH INJECTION, KEEP NEEDLE IN SKIN FOR AT LEAST 5 SECONDS AFTER THE COUNTER SHOWS ZERO   lansoprazole (PREVACID) 15 MG capsule Take 15 mg by mouth daily at 12 noon.   Lidocaine 4 % PTCH Apply topically. apply to left hip daily and remove at HS. [DX: Displaced intertrochanteric fracture of left femur, subsequent encounter for closed fracture with routine healing]   methocarbamol (ROBAXIN) 500 MG tablet Take 500 mg by mouth at bedtime.   mirtazapine (REMERON) 7.5 MG tablet Take 7.5 mg by mouth at bedtime.   ondansetron (ZOFRAN) 4 MG tablet Take 1 tablet (4 mg total) by mouth every 6 (six) hours as needed for nausea.   OXYGEN Inhale 2 L into the lungs continuous. Special Instructions: Document O2 saturation levels every shift; Day, Evening, Night   polyethylene glycol (MIRALAX) 17 g packet Take 17 g by mouth daily.   senna-docusate (SENOKOT-S) 8.6-50 MG tablet Take 2 tablets by mouth at bedtime.   sertraline (ZOLOFT) 25 MG tablet Take 25 mg by mouth at bedtime.   UNABLE TO FIND Diet:  Dysphagia 2 diet, Nectar thick liquids (CHO). Pt may have Glucerna and use straws.   No facility-administered encounter medications on file as of 08/09/2021.      SIGNIFICANT DIAGNOSTIC EXAMS   LABS REVIEWED PREVIOUS   06-12-21: wbc 7.6; hgb 14.6; hct 45.3; mcv 97.0 plt 108; glucose 276; bun 16; creat 0.92; k+ 4.2; na++ 135; ca 9.3; GFR 58; hgb a1c 7.3; vit D 42.29 06-15-21: wbc 19.7; hgb 8.0; hct 25.5; mcv 103.2 plt 100; glucose 238; bun 48; creat 1.49; k+ 4.8; na++ 139; ca 8.2 GFR 33; AM cortisol 28.8 07-16-21: wbc 8.1; hgb 9.7; hct 30.7; mcv 95.6 plt 170; glucose 203; bun 15; creat 0.49; k+ 3.6; na++ 131; ca 8.5  GFR >60; alk phos 187; albumin 2.2 hgb a1c 6.7   NO NEW LABS.   Review of Systems  Constitutional:  Negative for malaise/fatigue.  Respiratory:  Negative for cough and shortness of breath.   Cardiovascular:  Negative for chest pain, palpitations and leg swelling.  Gastrointestinal:  Negative for abdominal pain, constipation and heartburn.  Musculoskeletal:  Negative for back pain, joint pain and myalgias.  Skin: Negative.   Neurological:  Negative for dizziness.  Psychiatric/Behavioral:  The patient is not nervous/anxious.    Physical Exam Constitutional:      General: She is not in acute distress.    Appearance: She is cachectic. She is not diaphoretic.  Neck:     Thyroid: No thyromegaly.  Cardiovascular:     Rate and Rhythm: Normal rate and regular rhythm.     Pulses: Normal pulses.     Heart sounds: Normal heart sounds.  Pulmonary:     Effort: Pulmonary effort is normal. No respiratory distress.     Breath sounds: Normal breath sounds.     Comments: 02 Abdominal:     General: Bowel sounds are normal. There is no distension.     Palpations: Abdomen is soft.     Tenderness: There is no abdominal tenderness.  Musculoskeletal:     Cervical back: Neck supple.     Right lower leg: No edema.     Left lower leg: No edema.     Comments:  Able to move all extremities Status post IM Nail left hip 06-14-21    Lymphadenopathy:     Cervical: No cervical adenopathy.  Skin:    General: Skin is warm and dry.     Comments:  Stage IV: sacrum: 3 x 3.8 x 1.4 cm undermining 1.6 cm   Neurological:     Mental Status: She is alert. Mental status is at baseline.  Psychiatric:        Mood and Affect: Mood normal.    ASSESSMENT/ PLAN:  TODAY  Type 2 diabetes with CKD stage 2 and hypertension: hgb a1c 6.7 will continue lantus 8 units nightly novolog 5 units with meals asa 325 mg daily   2. Hypertension associated with type 2 diabetes mellitus: is table b/p 117/55 will continue norvasc 2.5 mg daily asa 325 mg daily   3. Gastroesophageal reflux disease without esophagitis: is stable will continue prevacid 15 mg daily   4. Severe protein calorie malnutrition is without change albumin 2.2 will continue prostat 30 mL three times daily   PREVIOUS   5. Chronic constipation: is stable will continue  senna s 2 tabs daily   6. Major depression recurrent chronic: is stable will continue zoloft 25 mg daily remeron 7.5 mg nightly   7. Vit D deficiency: stable is 42.29 will continue vitamin D 50,000 units weekly   8. Acute blood loss anemia: (ABLA) is stable hgb is 9.7 will continue to monitor her status.   9. Adult failure to thrive: weight is 99 pounds will monitor   10. Thrombocytopenia: has resolved plt is 170 will monitor   11. Aortic atherosclerosis (ct 10-04-20) will monitor   12. CKD stage 2 due to type 2 diabetes mellitus: is stable bun 15; creat 0.49 GFR >60   13. Stage 4 sacrum ulceration: is without change: will continue current treatment and will monitor her status.   14. Closed fracture of left hip with delayed healing subsequent encounter: is stable ; will continue robaxin 500 mg nightly tylenol 1 gm three times  daily lidoderm 4% patch to left hip daily for pain management   15. IPF(idiopathic pulmonary fibrosis): is without change is on chronic 02 at 2l; will continue advair 500/50 twice daily       Ok Edwards NP [facility]  Contact [phone] Monday through Friday 8am- 5pm   After hours call [phone]

## 2021-08-12 DIAGNOSIS — R279 Unspecified lack of coordination: Secondary | ICD-10-CM | POA: Diagnosis not present

## 2021-08-12 DIAGNOSIS — Z4789 Encounter for other orthopedic aftercare: Secondary | ICD-10-CM | POA: Diagnosis not present

## 2021-08-12 DIAGNOSIS — L03114 Cellulitis of left upper limb: Secondary | ICD-10-CM | POA: Insufficient documentation

## 2021-08-12 DIAGNOSIS — M6281 Muscle weakness (generalized): Secondary | ICD-10-CM | POA: Diagnosis not present

## 2021-08-12 DIAGNOSIS — R41841 Cognitive communication deficit: Secondary | ICD-10-CM | POA: Diagnosis not present

## 2021-08-12 DIAGNOSIS — S72142D Displaced intertrochanteric fracture of left femur, subsequent encounter for closed fracture with routine healing: Secondary | ICD-10-CM | POA: Diagnosis not present

## 2021-08-12 DIAGNOSIS — R1312 Dysphagia, oropharyngeal phase: Secondary | ICD-10-CM | POA: Diagnosis not present

## 2021-08-12 DIAGNOSIS — Z9181 History of falling: Secondary | ICD-10-CM | POA: Diagnosis not present

## 2021-08-13 DIAGNOSIS — S72142D Displaced intertrochanteric fracture of left femur, subsequent encounter for closed fracture with routine healing: Secondary | ICD-10-CM | POA: Diagnosis not present

## 2021-08-13 DIAGNOSIS — R279 Unspecified lack of coordination: Secondary | ICD-10-CM | POA: Diagnosis not present

## 2021-08-13 DIAGNOSIS — Z4789 Encounter for other orthopedic aftercare: Secondary | ICD-10-CM | POA: Diagnosis not present

## 2021-08-13 DIAGNOSIS — R1312 Dysphagia, oropharyngeal phase: Secondary | ICD-10-CM | POA: Diagnosis not present

## 2021-08-13 DIAGNOSIS — R41841 Cognitive communication deficit: Secondary | ICD-10-CM | POA: Diagnosis not present

## 2021-08-13 DIAGNOSIS — Z9181 History of falling: Secondary | ICD-10-CM | POA: Diagnosis not present

## 2021-08-13 DIAGNOSIS — M6281 Muscle weakness (generalized): Secondary | ICD-10-CM | POA: Diagnosis not present

## 2021-08-14 DIAGNOSIS — Z4789 Encounter for other orthopedic aftercare: Secondary | ICD-10-CM | POA: Diagnosis not present

## 2021-08-14 DIAGNOSIS — S72142D Displaced intertrochanteric fracture of left femur, subsequent encounter for closed fracture with routine healing: Secondary | ICD-10-CM | POA: Diagnosis not present

## 2021-08-14 DIAGNOSIS — R1312 Dysphagia, oropharyngeal phase: Secondary | ICD-10-CM | POA: Diagnosis not present

## 2021-08-14 DIAGNOSIS — Z9181 History of falling: Secondary | ICD-10-CM | POA: Diagnosis not present

## 2021-08-14 DIAGNOSIS — M6281 Muscle weakness (generalized): Secondary | ICD-10-CM | POA: Diagnosis not present

## 2021-08-14 DIAGNOSIS — R279 Unspecified lack of coordination: Secondary | ICD-10-CM | POA: Diagnosis not present

## 2021-08-14 DIAGNOSIS — R41841 Cognitive communication deficit: Secondary | ICD-10-CM | POA: Diagnosis not present

## 2021-08-15 DIAGNOSIS — R279 Unspecified lack of coordination: Secondary | ICD-10-CM | POA: Diagnosis not present

## 2021-08-15 DIAGNOSIS — Z4789 Encounter for other orthopedic aftercare: Secondary | ICD-10-CM | POA: Diagnosis not present

## 2021-08-15 DIAGNOSIS — Z9181 History of falling: Secondary | ICD-10-CM | POA: Diagnosis not present

## 2021-08-15 DIAGNOSIS — R1312 Dysphagia, oropharyngeal phase: Secondary | ICD-10-CM | POA: Diagnosis not present

## 2021-08-15 DIAGNOSIS — M6281 Muscle weakness (generalized): Secondary | ICD-10-CM | POA: Diagnosis not present

## 2021-08-15 DIAGNOSIS — S72142D Displaced intertrochanteric fracture of left femur, subsequent encounter for closed fracture with routine healing: Secondary | ICD-10-CM | POA: Diagnosis not present

## 2021-08-15 DIAGNOSIS — R41841 Cognitive communication deficit: Secondary | ICD-10-CM | POA: Diagnosis not present

## 2021-08-16 DIAGNOSIS — S72142D Displaced intertrochanteric fracture of left femur, subsequent encounter for closed fracture with routine healing: Secondary | ICD-10-CM | POA: Diagnosis not present

## 2021-08-16 DIAGNOSIS — Z4789 Encounter for other orthopedic aftercare: Secondary | ICD-10-CM | POA: Diagnosis not present

## 2021-08-16 DIAGNOSIS — Z9181 History of falling: Secondary | ICD-10-CM | POA: Diagnosis not present

## 2021-08-16 DIAGNOSIS — R41841 Cognitive communication deficit: Secondary | ICD-10-CM | POA: Diagnosis not present

## 2021-08-16 DIAGNOSIS — M6281 Muscle weakness (generalized): Secondary | ICD-10-CM | POA: Diagnosis not present

## 2021-08-16 DIAGNOSIS — R279 Unspecified lack of coordination: Secondary | ICD-10-CM | POA: Diagnosis not present

## 2021-08-16 DIAGNOSIS — R1312 Dysphagia, oropharyngeal phase: Secondary | ICD-10-CM | POA: Diagnosis not present

## 2021-08-19 DIAGNOSIS — Z9181 History of falling: Secondary | ICD-10-CM | POA: Diagnosis not present

## 2021-08-19 DIAGNOSIS — R41841 Cognitive communication deficit: Secondary | ICD-10-CM | POA: Diagnosis not present

## 2021-08-19 DIAGNOSIS — M6281 Muscle weakness (generalized): Secondary | ICD-10-CM | POA: Diagnosis not present

## 2021-08-19 DIAGNOSIS — S72142D Displaced intertrochanteric fracture of left femur, subsequent encounter for closed fracture with routine healing: Secondary | ICD-10-CM | POA: Diagnosis not present

## 2021-08-19 DIAGNOSIS — Z4789 Encounter for other orthopedic aftercare: Secondary | ICD-10-CM | POA: Diagnosis not present

## 2021-08-19 DIAGNOSIS — R1312 Dysphagia, oropharyngeal phase: Secondary | ICD-10-CM | POA: Diagnosis not present

## 2021-08-19 DIAGNOSIS — R279 Unspecified lack of coordination: Secondary | ICD-10-CM | POA: Diagnosis not present

## 2021-08-20 ENCOUNTER — Other Ambulatory Visit (HOSPITAL_COMMUNITY): Payer: Self-pay | Admitting: Specialist

## 2021-08-20 DIAGNOSIS — R279 Unspecified lack of coordination: Secondary | ICD-10-CM | POA: Diagnosis not present

## 2021-08-20 DIAGNOSIS — R1313 Dysphagia, pharyngeal phase: Secondary | ICD-10-CM

## 2021-08-20 DIAGNOSIS — R1312 Dysphagia, oropharyngeal phase: Secondary | ICD-10-CM | POA: Diagnosis not present

## 2021-08-20 DIAGNOSIS — M6281 Muscle weakness (generalized): Secondary | ICD-10-CM | POA: Diagnosis not present

## 2021-08-20 DIAGNOSIS — S72142D Displaced intertrochanteric fracture of left femur, subsequent encounter for closed fracture with routine healing: Secondary | ICD-10-CM | POA: Diagnosis not present

## 2021-08-20 DIAGNOSIS — Z9189 Other specified personal risk factors, not elsewhere classified: Secondary | ICD-10-CM

## 2021-08-20 DIAGNOSIS — Z4789 Encounter for other orthopedic aftercare: Secondary | ICD-10-CM | POA: Diagnosis not present

## 2021-08-20 DIAGNOSIS — R41841 Cognitive communication deficit: Secondary | ICD-10-CM | POA: Diagnosis not present

## 2021-08-20 DIAGNOSIS — Z9181 History of falling: Secondary | ICD-10-CM | POA: Diagnosis not present

## 2021-08-21 DIAGNOSIS — Z4789 Encounter for other orthopedic aftercare: Secondary | ICD-10-CM | POA: Diagnosis not present

## 2021-08-21 DIAGNOSIS — R279 Unspecified lack of coordination: Secondary | ICD-10-CM | POA: Diagnosis not present

## 2021-08-21 DIAGNOSIS — R1312 Dysphagia, oropharyngeal phase: Secondary | ICD-10-CM | POA: Diagnosis not present

## 2021-08-21 DIAGNOSIS — Z9181 History of falling: Secondary | ICD-10-CM | POA: Diagnosis not present

## 2021-08-21 DIAGNOSIS — R41841 Cognitive communication deficit: Secondary | ICD-10-CM | POA: Diagnosis not present

## 2021-08-21 DIAGNOSIS — S72142D Displaced intertrochanteric fracture of left femur, subsequent encounter for closed fracture with routine healing: Secondary | ICD-10-CM | POA: Diagnosis not present

## 2021-08-21 DIAGNOSIS — M6281 Muscle weakness (generalized): Secondary | ICD-10-CM | POA: Diagnosis not present

## 2021-08-22 DIAGNOSIS — R41841 Cognitive communication deficit: Secondary | ICD-10-CM | POA: Diagnosis not present

## 2021-08-22 DIAGNOSIS — R279 Unspecified lack of coordination: Secondary | ICD-10-CM | POA: Diagnosis not present

## 2021-08-22 DIAGNOSIS — Z9181 History of falling: Secondary | ICD-10-CM | POA: Diagnosis not present

## 2021-08-22 DIAGNOSIS — M6281 Muscle weakness (generalized): Secondary | ICD-10-CM | POA: Diagnosis not present

## 2021-08-22 DIAGNOSIS — S72142D Displaced intertrochanteric fracture of left femur, subsequent encounter for closed fracture with routine healing: Secondary | ICD-10-CM | POA: Diagnosis not present

## 2021-08-22 DIAGNOSIS — Z4789 Encounter for other orthopedic aftercare: Secondary | ICD-10-CM | POA: Diagnosis not present

## 2021-08-22 DIAGNOSIS — R1312 Dysphagia, oropharyngeal phase: Secondary | ICD-10-CM | POA: Diagnosis not present

## 2021-08-23 DIAGNOSIS — R1312 Dysphagia, oropharyngeal phase: Secondary | ICD-10-CM | POA: Diagnosis not present

## 2021-08-23 DIAGNOSIS — R279 Unspecified lack of coordination: Secondary | ICD-10-CM | POA: Diagnosis not present

## 2021-08-23 DIAGNOSIS — S72142D Displaced intertrochanteric fracture of left femur, subsequent encounter for closed fracture with routine healing: Secondary | ICD-10-CM | POA: Diagnosis not present

## 2021-08-23 DIAGNOSIS — Z9181 History of falling: Secondary | ICD-10-CM | POA: Diagnosis not present

## 2021-08-23 DIAGNOSIS — Z4789 Encounter for other orthopedic aftercare: Secondary | ICD-10-CM | POA: Diagnosis not present

## 2021-08-23 DIAGNOSIS — R41841 Cognitive communication deficit: Secondary | ICD-10-CM | POA: Diagnosis not present

## 2021-08-23 DIAGNOSIS — M6281 Muscle weakness (generalized): Secondary | ICD-10-CM | POA: Diagnosis not present

## 2021-08-24 DIAGNOSIS — Z4789 Encounter for other orthopedic aftercare: Secondary | ICD-10-CM | POA: Diagnosis not present

## 2021-08-24 DIAGNOSIS — M6281 Muscle weakness (generalized): Secondary | ICD-10-CM | POA: Diagnosis not present

## 2021-08-24 DIAGNOSIS — R41841 Cognitive communication deficit: Secondary | ICD-10-CM | POA: Diagnosis not present

## 2021-08-24 DIAGNOSIS — Z9181 History of falling: Secondary | ICD-10-CM | POA: Diagnosis not present

## 2021-08-24 DIAGNOSIS — R1312 Dysphagia, oropharyngeal phase: Secondary | ICD-10-CM | POA: Diagnosis not present

## 2021-08-24 DIAGNOSIS — R279 Unspecified lack of coordination: Secondary | ICD-10-CM | POA: Diagnosis not present

## 2021-08-24 DIAGNOSIS — S72142D Displaced intertrochanteric fracture of left femur, subsequent encounter for closed fracture with routine healing: Secondary | ICD-10-CM | POA: Diagnosis not present

## 2021-08-25 DIAGNOSIS — Z4789 Encounter for other orthopedic aftercare: Secondary | ICD-10-CM | POA: Diagnosis not present

## 2021-08-25 DIAGNOSIS — R41841 Cognitive communication deficit: Secondary | ICD-10-CM | POA: Diagnosis not present

## 2021-08-25 DIAGNOSIS — R1312 Dysphagia, oropharyngeal phase: Secondary | ICD-10-CM | POA: Diagnosis not present

## 2021-08-25 DIAGNOSIS — Z9181 History of falling: Secondary | ICD-10-CM | POA: Diagnosis not present

## 2021-08-25 DIAGNOSIS — M6281 Muscle weakness (generalized): Secondary | ICD-10-CM | POA: Diagnosis not present

## 2021-08-25 DIAGNOSIS — R279 Unspecified lack of coordination: Secondary | ICD-10-CM | POA: Diagnosis not present

## 2021-08-25 DIAGNOSIS — S72142D Displaced intertrochanteric fracture of left femur, subsequent encounter for closed fracture with routine healing: Secondary | ICD-10-CM | POA: Diagnosis not present

## 2021-08-26 ENCOUNTER — Encounter: Payer: Self-pay | Admitting: Adult Health

## 2021-08-26 ENCOUNTER — Other Ambulatory Visit: Payer: Self-pay | Admitting: Adult Health

## 2021-08-26 ENCOUNTER — Non-Acute Institutional Stay (SKILLED_NURSING_FACILITY): Payer: Medicare Other | Admitting: Adult Health

## 2021-08-26 ENCOUNTER — Non-Acute Institutional Stay: Payer: Self-pay | Admitting: Adult Health

## 2021-08-26 DIAGNOSIS — Z4789 Encounter for other orthopedic aftercare: Secondary | ICD-10-CM | POA: Diagnosis not present

## 2021-08-26 DIAGNOSIS — L89154 Pressure ulcer of sacral region, stage 4: Secondary | ICD-10-CM

## 2021-08-26 DIAGNOSIS — R1312 Dysphagia, oropharyngeal phase: Secondary | ICD-10-CM | POA: Diagnosis not present

## 2021-08-26 DIAGNOSIS — R41841 Cognitive communication deficit: Secondary | ICD-10-CM | POA: Diagnosis not present

## 2021-08-26 DIAGNOSIS — M6281 Muscle weakness (generalized): Secondary | ICD-10-CM | POA: Diagnosis not present

## 2021-08-26 DIAGNOSIS — L98429 Non-pressure chronic ulcer of back with unspecified severity: Secondary | ICD-10-CM

## 2021-08-26 DIAGNOSIS — R627 Adult failure to thrive: Secondary | ICD-10-CM

## 2021-08-26 DIAGNOSIS — S72142D Displaced intertrochanteric fracture of left femur, subsequent encounter for closed fracture with routine healing: Secondary | ICD-10-CM | POA: Diagnosis not present

## 2021-08-26 DIAGNOSIS — Z9181 History of falling: Secondary | ICD-10-CM | POA: Diagnosis not present

## 2021-08-26 DIAGNOSIS — R279 Unspecified lack of coordination: Secondary | ICD-10-CM | POA: Diagnosis not present

## 2021-08-26 MED ORDER — DRONABINOL 2.5 MG PO CAPS: 2.5000 mg | ORAL_CAPSULE | Freq: Every day | ORAL | 0 refills | Status: DC

## 2021-08-26 NOTE — Progress Notes (Signed)
Location:  [place] Room Number: 140-D Place of Service:  SNF (31)   CODE STATUS: DNR  Allergies  Allergen Reactions   Hydrochlorothiazide Other (See Comments) and Itching    Taken from faxed notes of dr Osborne Casco (to short stay)  THROMBOCYTOPENIA Taken from faxed notes of dr Osborne Casco (to short stay)  THROMBOCYTOPENIA   Prilosec [Omeprazole] Diarrhea    Chief Complaint  Patient presents with   Acute Visit    Wound management    HPI:  She has a stage IV sacral wound. There are no indications of infection present. She does have some pain with movement and wound care. There are no reports of fevers present.   Past Medical History:  Diagnosis Date   Diabetes mellitus    Fracture 10/2012   left wrist   Fracture of left clavicle    12/12 wore a sling   GERD (gastroesophageal reflux disease)    Hypertension    Osteoporosis    Pulmonary fibrosis ([place])     Past Surgical History:  Procedure Laterality Date   FEMUR FRACTURE SURGERY      6/08 partial hip replacement right   ganglion cyst removal     left wrist 1960   HIP FRACTURE SURGERY     1984 pin and plate placed right   HIP SURGERY     pin and plate removed 0/0370   INTRAMEDULLARY (IM) NAIL INTERTROCHANTERIC Left 06/14/2021   Procedure: INTRAMEDULLARY (IM) NAIL INTERTROCHANTRIC;  Surgeon: Carole Civil, MD;  Location: [facility];  Service: Orthopedics;  Laterality: Left;   ORIF ELBOW FRACTURE Right 02/18/2013   Procedure: OPEN REDUCTION INTERNAL FIXATION (ORIF) ELBOW/OLECRANON FRACTURE;  Surgeon: Newt Minion, MD;  Location: [facility];  Service: Orthopedics;  Laterality: Right;   ORIF ELBOW FRACTURE Left 05/09/2015   Procedure: OPEN REDUCTION INTERNAL FIXATION (ORIF) LEFT MEDIAL HUMERAL CONDYLE FRACTURE;  Surgeon: Leandrew Koyanagi, MD;  Location: [facility];  Service: Orthopedics;  Laterality: Left;    Social History   Socioeconomic History   Marital status: Widowed    Spouse name: Not on file   Number  of children: Y   Years of education: Not on file   Highest education level: Not on file  Occupational History   Occupation: retired Optometrist  Tobacco Use   Smoking status: Never   Smokeless tobacco: Never  Vaping Use   Vaping Use: Never used  Substance and Sexual Activity   Alcohol use: No   Drug use: No   Sexual activity: Not on file  Other Topics Concern   Not on file  Social History Narrative   Not on file   Social Determinants of Health   Financial Resource Strain: Not on file  Food Insecurity: Not on file  Transportation Needs: Not on file  Physical Activity: Not on file  Stress: Not on file  Social Connections: Not on file  Intimate Partner Violence: Not on file   Family History  Problem Relation Age of Onset   Pancreatic cancer Brother    Stroke Mother    Diabetes Father       VITAL SIGNS BP 113/65   Pulse 98   Temp 98.5 F (36.9 C)   Resp (!) 22   Ht 5' 1" (1.549 m)   Wt 99 lb 12.8 oz (45.3 kg)   SpO2 98%   BMI 18.86 kg/m   Outpatient Encounter Medications as of 08/26/2021  Medication Sig   acetaminophen (TYLENOL) 500 MG tablet Take  1,000 mg by mouth 3 (three) times daily. 9 am , 2 pm, and 9 pm   ADVAIR DISKUS 500-50 MCG/DOSE AEPB INHALE 1 PUFF INTO THE LUNGS TWICE DAILY   Amino Acids-Protein Hydrolys (FEEDING SUPPLEMENT, PRO-STAT SUGAR FREE 64,) LIQD Take 30 mLs by mouth 3 (three) times daily with meals. To promote wound healing and for poor meal intake   amLODipine (NORVASC) 2.5 MG tablet Take 1 tablet (2.5 mg total) by mouth daily.   antiseptic oral rinse (BIOTENE) LIQD 15 mLs by Mouth Rinse route every 6 (six) hours as needed for dry mouth.   aspirin EC 81 MG tablet Take 81 mg by mouth daily. Swallow whole.   D-5000 125 MCG (5000 UT) TABS Take 125 mcg by mouth daily.   dronabinol (MARINOL) 2.5 MG capsule Take 1 capsule (2.5 mg total) by mouth daily before lunch.   DULoxetine (CYMBALTA) 20 MG capsule Take 20 mg by mouth daily.   insulin  glargine (LANTUS SOLOSTAR) 100 UNIT/ML Solostar Pen Inject 8 Units into the skin at bedtime.   insulin lispro (HUMALOG) 100 UNIT/ML injection Inject 5 Units into the skin 3 (three) times daily before meals. 7 am, 11:30 am, and 6 pm, Special Instructions: GIVE FOR CBG > 150. PRIME WITH 2 UNITS BEFORE EACH INJECTION. AFTER EACH INJECTION, KEEP NEEDLE IN SKIN FOR AT LEAST 5 SECONDS AFTER THE COUNTER SHOWS ZERO   lansoprazole (PREVACID) 15 MG capsule Take 15 mg by mouth daily at 12 noon.   Lidocaine 4 % PTCH Apply topically. apply to left hip daily and remove at HS. [DX: Displaced intertrochanteric fracture of left femur, subsequent encounter for closed fracture with routine healing]   methocarbamol (ROBAXIN) 500 MG tablet Take 500 mg by mouth at bedtime.   mirtazapine (REMERON) 7.5 MG tablet Take 7.5 mg by mouth at bedtime.   ondansetron (ZOFRAN) 4 MG tablet Take 1 tablet (4 mg total) by mouth every 6 (six) hours as needed for nausea.   OXYGEN Inhale 2 L into the lungs continuous. Special Instructions: Document O2 saturation levels every shift; Day, Evening, Night   sertraline (ZOLOFT) 25 MG tablet Take 25 mg by mouth at bedtime.   UNABLE TO FIND Diet:  Dysphagia 2 diet, Nectar thick liquids (CHO). Pt may have Glucerna and use straws.   No facility-administered encounter medications on file as of 08/26/2021.     SIGNIFICANT DIAGNOSTIC EXAMS   LABS REVIEWED PREVIOUS   06-12-21: wbc 7.6; hgb 14.6; hct 45.3; mcv 97.0 plt 108; glucose 276; bun 16; creat 0.92; k+ 4.2; na++ 135; ca 9.3; GFR 58; hgb a1c 7.3; vit D 42.29 06-15-21: wbc 19.7; hgb 8.0; hct 25.5; mcv 103.2 plt 100; glucose 238; bun 48; creat 1.49; k+ 4.8; na++ 139; ca 8.2 GFR 33; AM cortisol 28.8 07-16-21: wbc 8.1; hgb 9.7; hct 30.7; mcv 95.6 plt 170; glucose 203; bun 15; creat 0.49; k+ 3.6; na++ 131; ca 8.5 GFR >60; alk phos 187; albumin 2.2 hgb a1c 6.7   NO NEW LABS.   Review of Systems  Unable to perform ROS: Medical condition    Physical Exam Constitutional:      General: She is not in acute distress.    Appearance: She is cachectic. She is not diaphoretic.  Neck:     Thyroid: No thyromegaly.  Cardiovascular:     Rate and Rhythm: Normal rate and regular rhythm.     Pulses: Normal pulses.     Heart sounds: Normal heart sounds.  Pulmonary:  Effort: Pulmonary effort is normal. No respiratory distress.     Breath sounds: Normal breath sounds.  Abdominal:     General: Bowel sounds are normal. There is no distension.     Palpations: Abdomen is soft.     Tenderness: There is no abdominal tenderness.  Musculoskeletal:     Cervical back: Neck supple.     Right lower leg: No edema.     Left lower leg: No edema.     Comments:  Kyphosis  Able to move all extremities Status post IM Nail left hip 06-14-21   Lymphadenopathy:     Cervical: No cervical adenopathy.  Skin:    General: Skin is warm and dry.     Comments: Stage 4 sacr ulceration: 1.6 x 1.0 x 0.2 with undermining 1.4 cm. No indications of infection present.   Neurological:     Mental Status: She is alert. Mental status is at baseline.  Psychiatric:        Mood and Affect: Mood normal.     ASSESSMENT/ PLAN:  TODAY  Stage 4 skin ulceration of sacral region: is without change will continue current treatments and will monitor her status.    Ok Edwards NP [facility]  Contact [phone] Monday through Friday 8am- 5pm  After hours call [phone]

## 2021-08-26 NOTE — Progress Notes (Signed)
Location:  [place] Room Number: 140-D Place of Service:  SNF (31)   CODE STATUS: DNR  Allergies  Allergen Reactions   Hydrochlorothiazide Other (See Comments) and Itching    Taken from faxed notes of dr Osborne Casco (to short stay)  THROMBOCYTOPENIA Taken from faxed notes of dr Osborne Casco (to short stay)  THROMBOCYTOPENIA   Prilosec [Omeprazole] Diarrhea    Chief Complaint  Patient presents with   Acute Visit    Poor appetite     HPI:  She has a poor appetite; poor po intake her current weight is 99 pounds weight in August 106 pounds. She has dysphagia and is on nectar thick liquids. She is presently on supplements; prostat and remeron 7.5 mg nightly. She has a stage 4 sacral ulceration.   Past Medical History:  Diagnosis Date   Diabetes mellitus    Fracture 10/2012   left wrist   Fracture of left clavicle    12/12 wore a sling   GERD (gastroesophageal reflux disease)    Hypertension    Osteoporosis    Pulmonary fibrosis ([place])     Past Surgical History:  Procedure Laterality Date   FEMUR FRACTURE SURGERY      6/08 partial hip replacement right   ganglion cyst removal     left wrist 1960   HIP FRACTURE SURGERY     1984 pin and plate placed right   HIP SURGERY     pin and plate removed 12/6107   INTRAMEDULLARY (IM) NAIL INTERTROCHANTERIC Left 06/14/2021   Procedure: INTRAMEDULLARY (IM) NAIL INTERTROCHANTRIC;  Surgeon: Carole Civil, MD;  Location: [facility];  Service: Orthopedics;  Laterality: Left;   ORIF ELBOW FRACTURE Right 02/18/2013   Procedure: OPEN REDUCTION INTERNAL FIXATION (ORIF) ELBOW/OLECRANON FRACTURE;  Surgeon: Newt Minion, MD;  Location: [facility];  Service: Orthopedics;  Laterality: Right;   ORIF ELBOW FRACTURE Left 05/09/2015   Procedure: OPEN REDUCTION INTERNAL FIXATION (ORIF) LEFT MEDIAL HUMERAL CONDYLE FRACTURE;  Surgeon: Leandrew Koyanagi, MD;  Location: [facility];  Service: Orthopedics;  Laterality: Left;    Social History    Socioeconomic History   Marital status: Widowed    Spouse name: Not on file   Number of children: Y   Years of education: Not on file   Highest education level: Not on file  Occupational History   Occupation: retired Optometrist  Tobacco Use   Smoking status: Never   Smokeless tobacco: Never  Vaping Use   Vaping Use: Never used  Substance and Sexual Activity   Alcohol use: No   Drug use: No   Sexual activity: Not on file  Other Topics Concern   Not on file  Social History Narrative   Not on file   Social Determinants of Health   Financial Resource Strain: Not on file  Food Insecurity: Not on file  Transportation Needs: Not on file  Physical Activity: Not on file  Stress: Not on file  Social Connections: Not on file  Intimate Partner Violence: Not on file   Family History  Problem Relation Age of Onset   Pancreatic cancer Brother    Stroke Mother    Diabetes Father       VITAL SIGNS BP 113/65   Pulse 98   Temp 98.5 F (36.9 C)   Resp (!) 22   Ht $R'5\' 1"'nh$  (1.549 m)   Wt 99 lb 12.8 oz (45.3 kg)   SpO2 98%   BMI 18.86 kg/m   Outpatient  Encounter Medications as of 08/26/2021  Medication Sig   acetaminophen (TYLENOL) 500 MG tablet Take 1,000 mg by mouth 3 (three) times daily. 9 am , 2 pm, and 9 pm   ADVAIR DISKUS 500-50 MCG/DOSE AEPB INHALE 1 PUFF INTO THE LUNGS TWICE DAILY   Amino Acids-Protein Hydrolys (FEEDING SUPPLEMENT, PRO-STAT SUGAR FREE 64,) LIQD Take 30 mLs by mouth 3 (three) times daily with meals. To promote wound healing and for poor meal intake   amLODipine (NORVASC) 2.5 MG tablet Take 1 tablet (2.5 mg total) by mouth daily.   antiseptic oral rinse (BIOTENE) LIQD 15 mLs by Mouth Rinse route every 6 (six) hours as needed for dry mouth.   aspirin EC 81 MG tablet Take 81 mg by mouth daily. Swallow whole.   D-5000 125 MCG (5000 UT) TABS Take 125 mcg by mouth daily.   DULoxetine (CYMBALTA) 20 MG capsule Take 20 mg by mouth daily.   insulin glargine  (LANTUS SOLOSTAR) 100 UNIT/ML Solostar Pen Inject 8 Units into the skin at bedtime.   insulin lispro (HUMALOG) 100 UNIT/ML injection Inject 5 Units into the skin 3 (three) times daily before meals. 7 am, 11:30 am, and 6 pm, Special Instructions: GIVE FOR CBG > 150. PRIME WITH 2 UNITS BEFORE EACH INJECTION. AFTER EACH INJECTION, KEEP NEEDLE IN SKIN FOR AT LEAST 5 SECONDS AFTER THE COUNTER SHOWS ZERO   lansoprazole (PREVACID) 15 MG capsule Take 15 mg by mouth daily at 12 noon.   Lidocaine 4 % PTCH Apply topically. apply to left hip daily and remove at HS. [DX: Displaced intertrochanteric fracture of left femur, subsequent encounter for closed fracture with routine healing]   methocarbamol (ROBAXIN) 500 MG tablet Take 500 mg by mouth at bedtime.   mirtazapine (REMERON) 7.5 MG tablet Take 7.5 mg by mouth at bedtime.   ondansetron (ZOFRAN) 4 MG tablet Take 1 tablet (4 mg total) by mouth every 6 (six) hours as needed for nausea.   OXYGEN Inhale 2 L into the lungs continuous. Special Instructions: Document O2 saturation levels every shift; Day, Evening, Night   sertraline (ZOLOFT) 25 MG tablet Take 25 mg by mouth at bedtime.   UNABLE TO FIND Diet:  Dysphagia 2 diet, Nectar thick liquids (CHO). Pt may have Glucerna and use straws.   [DISCONTINUED] doxycycline (VIBRA-TABS) 100 MG tablet Take 100 mg by mouth 2 (two) times daily.   [DISCONTINUED] polyethylene glycol (MIRALAX) 17 g packet Take 17 g by mouth daily.   [DISCONTINUED] senna-docusate (SENOKOT-S) 8.6-50 MG tablet Take 2 tablets by mouth at bedtime.   No facility-administered encounter medications on file as of 08/26/2021.     SIGNIFICANT DIAGNOSTIC EXAMS   LABS REVIEWED PREVIOUS   06-12-21: wbc 7.6; hgb 14.6; hct 45.3; mcv 97.0 plt 108; glucose 276; bun 16; creat 0.92; k+ 4.2; na++ 135; ca 9.3; GFR 58; hgb a1c 7.3; vit D 42.29 06-15-21: wbc 19.7; hgb 8.0; hct 25.5; mcv 103.2 plt 100; glucose 238; bun 48; creat 1.49; k+ 4.8; na++ 139; ca 8.2  GFR 33; AM cortisol 28.8 07-16-21: wbc 8.1; hgb 9.7; hct 30.7; mcv 95.6 plt 170; glucose 203; bun 15; creat 0.49; k+ 3.6; na++ 131; ca 8.5 GFR >60; alk phos 187; albumin 2.2 hgb a1c 6.7   NO NEW LABS.   Review of Systems  Constitutional:  Negative for malaise/fatigue.  Respiratory:  Negative for cough and shortness of breath.   Cardiovascular:  Negative for chest pain, palpitations and leg swelling.  Gastrointestinal:  Negative for abdominal  pain, constipation and heartburn.  Musculoskeletal:  Negative for back pain, joint pain and myalgias.  Skin:        Sore   Neurological:  Negative for dizziness.  Psychiatric/Behavioral:  The patient is not nervous/anxious.    Physical Exam Constitutional:      General: She is not in acute distress.    Appearance: She is cachectic. She is not diaphoretic.  Neck:     Thyroid: No thyromegaly.  Cardiovascular:     Rate and Rhythm: Normal rate and regular rhythm.     Pulses: Normal pulses.     Heart sounds: Normal heart sounds.  Pulmonary:     Effort: Pulmonary effort is normal. No respiratory distress.     Breath sounds: Normal breath sounds.     Comments: 02 Abdominal:     General: Bowel sounds are normal. There is no distension.     Palpations: Abdomen is soft.     Tenderness: There is no abdominal tenderness.  Musculoskeletal:     Cervical back: Neck supple.     Right lower leg: No edema.     Left lower leg: No edema.     Comments: Kyphosis  Able to move all extremities Status post IM Nail left hip 06-14-21  Lymphadenopathy:     Cervical: No cervical adenopathy.  Skin:    General: Skin is warm and dry.     Comments: Stage 4 sacral ulcer: 2.6 x 2.2 x 0.4 cm with undermining present no indications of infection present   Neurological:     Mental Status: She is alert. Mental status is at baseline.  Psychiatric:        Mood and Affect: Mood normal.      ASSESSMENT/ PLAN:  TODAY  Failure to thrive in adult Stage IV pressure  ulcer of sacral region  Will continue silver alginate to wound twice daily Will begin marinol 2.5 mg daily prior to lunch to help stimulate appetite     Ok Edwards NP [facility]  Contact [phone] Monday through Friday 8am- 5pm  After hours call [phone]

## 2021-08-27 ENCOUNTER — Ambulatory Visit (HOSPITAL_COMMUNITY): Payer: Medicare Other | Attending: Internal Medicine | Admitting: Speech Pathology

## 2021-08-27 ENCOUNTER — Ambulatory Visit (HOSPITAL_COMMUNITY)
Admission: RE | Admit: 2021-08-27 | Discharge: 2021-08-27 | Disposition: A | Discharge status: Home / Self Care | Payer: Medicare Other | Source: Skilled Nursing Facility | Attending: Internal Medicine | Admitting: Internal Medicine

## 2021-08-27 ENCOUNTER — Encounter (HOSPITAL_COMMUNITY): Payer: Self-pay | Admitting: Speech Pathology

## 2021-08-27 DIAGNOSIS — R1312 Dysphagia, oropharyngeal phase: Secondary | ICD-10-CM | POA: Insufficient documentation

## 2021-08-27 DIAGNOSIS — Z9189 Other specified personal risk factors, not elsewhere classified: Secondary | ICD-10-CM | POA: Insufficient documentation

## 2021-08-27 DIAGNOSIS — R1313 Dysphagia, pharyngeal phase: Secondary | ICD-10-CM

## 2021-08-27 DIAGNOSIS — K219 Gastro-esophageal reflux disease without esophagitis: Secondary | ICD-10-CM | POA: Diagnosis not present

## 2021-08-27 NOTE — Therapy (Signed)
[facility] [address] [place], [place], [address] Phone: [phone]   Fax:  [phone]  Modified Barium Swallow  Patient Details  Name: Lauren Avery MRN: [id number] Date of Birth: [birth year] No data recorded  Encounter Date: 08/27/2021   End of Session - 08/27/21 1754     Visit Number 1    Number of Visits 1    Authorization Type BCBS Medicare    SLP Start Time 1145    SLP Stop Time  1225    SLP Time Calculation (min) 40 min    Activity Tolerance Patient limited by fatigue             Past Medical History:  Diagnosis Date   Diabetes mellitus    Fracture 10/2012   left wrist   Fracture of left clavicle    12/12 wore a sling   GERD (gastroesophageal reflux disease)    Hypertension    Osteoporosis    Pulmonary fibrosis ([facility])     Past Surgical History:  Procedure Laterality Date   FEMUR FRACTURE SURGERY      6/08 partial hip replacement right   ganglion cyst removal     left wrist 1960   HIP FRACTURE SURGERY     1984 pin and plate placed right   HIP SURGERY     pin and plate removed 01/5008   INTRAMEDULLARY (IM) NAIL INTERTROCHANTERIC Left 06/14/2021   Procedure: INTRAMEDULLARY (IM) NAIL INTERTROCHANTRIC;  Surgeon: Vickki Hearing, MD;  Location: [facility];  Service: Orthopedics;  Laterality: Left;   ORIF ELBOW FRACTURE Right 02/18/2013   Procedure: OPEN REDUCTION INTERNAL FIXATION (ORIF) ELBOW/OLECRANON FRACTURE;  Surgeon: Nadara Mustard, MD;  Location[facility];  Service: Orthopedics;  Laterality: Right;   ORIF ELBOW FRACTURE Left 05/09/2015   Procedure: OPEN REDUCTION INTERNAL FIXATION (ORIF) LEFT MEDIAL HUMERAL CONDYLE FRACTURE;  Surgeon: Tarry Kos, MD;  Location: [facility];  Service: Orthopedics;  Laterality: Left;    There were no vitals filed for this visit.   Subjective Assessment - 08/27/21 1519     Subjective "Ah"    Currently in Pain? No/denies                 General - 08/27/21 1520        General Information   Date of Onset 08/13/21    HPI Radie Berges is a 87 y.o. female who was referred by Dr. Marga Melnick for MBSS. She has a past medical history significant of hypertension, diabetes, osteoporosis, COPD/pulmonary fibrosis and GERD admitted to [facility] on 06/13/2019 with left hip fracture after mechanical fall. S/p orthopedic fixation on 06/15/2019. Pt with acute hypoxic respiratory failure--- patient with underlying COPD/pulmonary fibrosis, postop on 06/14/2021 patient developed worsening hypoxia, tachypnea and tachycardia chest x-ray suggestive of possible right-sided lower lobe pneumonia- and had BSE during that admission with recommendation for puree and NTL.  Pt had MBSS 04/12/2021 with recommendation for regular textures and thin liquids with use of a chin tuck. at [facility].    Type of Study MBS-Modified Barium Swallow Study    Previous Swallow Assessment BSE 7/22 puree/NTL, MBSS 5/22 regular/thin    Diet Prior to this Study Dysphagia 2 (chopped);Nectar-thick liquids    Temperature Spikes Noted No    Respiratory Status Room air    History of Recent Intubation No    Behavior/Cognition Alert;Requires cueing;Other (Comment)   very fatigued   Oral Cavity Assessment Within Functional Limits    Oral  Care Completed by SLP No    Oral Cavity - Dentition Adequate natural dentition    Vision Functional for self feeding    Self-Feeding Abilities Able to feed self;Needs set up    Patient Positioning Upright in chair    Baseline Vocal Quality Normal    Volitional Cough Weak    Volitional Swallow Able to elicit    Anatomy Within functional limits    Pharyngeal Secretions Not observed secondary MBS                Oral Preparation/Oral Phase - 08/27/21 1743       Oral Preparation/Oral Phase   Oral Phase Impaired      Oral - Honey   Oral - Honey Cup Weak ligual manipulation;Oral residue;Decreased bolus cohesion      Oral - Nectar   Oral - Nectar Cup Decreased  bolus cohesion;Oral residue;Weak ligual manipulation      Oral - Thin   Oral - Thin Teaspoon Weak ligual manipulation;Decreased bolus cohesion;Oral residue    Oral - Thin Cup Weak ligual manipulation;Decreased bolus cohesion;Oral residue    Oral - Thin Straw Other (Comment)   premature spillage     Oral - Solids   Oral - Puree Weak ligual manipulation;Piecemeal swallowing;Decreased bolus cohesion    Oral - Regular Weak ligual manipulation;Decreased bolus cohesion;Delayed A-P transit;Oral residue;Piecemeal swallowing;Imparied mastication      Electrical stimulation - Oral Phase   Was Electrical Stimulation Used No              Pharyngeal Phase - 08/27/21 1747       Pharyngeal Phase   Pharyngeal Phase Impaired      Pharyngeal - Honey   Pharyngeal- Honey Cup Swallow initiation at vallecula;Reduced epiglottic inversion;Reduced tongue base retraction;Pharyngeal residue - valleculae;Lateral channel residue      Pharyngeal - Nectar   Pharyngeal- Nectar Cup Swallow initiation at vallecula;Reduced epiglottic inversion;Reduced tongue base retraction;Pharyngeal residue - valleculae;Lateral channel residue      Pharyngeal - Thin   Pharyngeal- Thin Teaspoon Swallow initiation at vallecula;Reduced epiglottic inversion;Reduced tongue base retraction;Pharyngeal residue - valleculae;Penetration/Aspiration during swallow    Pharyngeal Material enters airway, remains ABOVE vocal cords and not ejected out    Pharyngeal- Thin Cup Swallow initiation at vallecula;Reduced tongue base retraction;Penetration/Aspiration before swallow;Penetration/Apiration after swallow;Trace aspiration;Moderate aspiration;Reduced airway/laryngeal closure;Lateral channel residue    Pharyngeal Material enters airway, passes BELOW cords without attempt by patient to eject out (silent aspiration);Material enters airway, passes BELOW cords and not ejected out despite cough attempt by patient    Pharyngeal- Thin Straw Swallow  initiation at pyriform sinus;Reduced airway/laryngeal closure;Reduced tongue base retraction;Penetration/Aspiration during swallow;Penetration/Apiration after swallow;Moderate aspiration;Pharyngeal residue - valleculae;Pharyngeal residue - pyriform;Lateral channel residue    Pharyngeal Material enters airway, passes BELOW cords without attempt by patient to eject out (silent aspiration);Material enters airway, passes BELOW cords and not ejected out despite cough attempt by patient      Pharyngeal - Solids   Pharyngeal- Puree Swallow initiation at vallecula;Reduced tongue base retraction;Pharyngeal residue - valleculae    Pharyngeal- Regular Delayed swallow initiation-vallecula;Pharyngeal residue - valleculae;Reduced tongue base retraction      Electrical Stimulation - Pharyngeal Phase   Was Electrical Stimulation Used No              Cricopharyngeal Phase - 08/27/21 1753       Cervical Esophageal Phase   Cervical Esophageal Phase Within functional limits  Plan - 08/27/21 1755     Clinical Impression Statement Pt presents with moderate oropharyngeal phase dysphagia characterized by weak lingual movement and impaired mastication resulting in piecemeal deglutition, premature spillage, impaired and inefficient bolus cohesion, and oral residue; pharyngeal phase is marked by premature spillage, reduced tongue base retraction, and reduced laryngeal vestibule closure resulting in penetration/aspiration during and after the swallow with thins and significant pharyngeal residuals with all liquid consistencies. Pt with mild/mod base of tongue and vallecular residue with solids and benefited from liquid wash. Pt was extremely fatigued and suspect performance was greatly impacted by this. She is at risk for aspiration and inability to meet nutritional needs due to weakness and fatigue. The imaging from her MBSS in May was reviewed and is quite different from current in terms of  timeliness and efficiency of swallow. Recommend D2 and NTL with consideration of [place] Levels free water protocol if within Pt's goals of care. SLP discussed this MBSS with treating SLP, who reiterated that Pt fatigue has been a barrier to safe po intake at SNF (Pt also has pressure wounds on her backside which makes sitting very uncomfortable). Consider repeat MBSS if/when Pt's overall strength and endurance improves. Provide po medication crushed in puree and follow with liquid wash.             Patient will benefit from skilled therapeutic intervention in order to improve the following deficits and impairments:   Dysphagia, oropharyngeal phase     Recommendations/Treatment - 08/27/21 1753       Swallow Evaluation Recommendations   SLP Diet Recommendations Nectar;Dysphagia 2 (chopped);Dysphagia 1 (puree)    Liquid Administration via Cup;No straw    Medication Administration Crushed with puree    Supervision Full supervision/cueing for compensatory strategies    Compensations Small sips/bites;Lingual sweep for clearance of pocketing;Multiple dry swallows after each bite/sip;Effortful swallow    Postural Changes Seated upright at 90 degrees;Remain upright for at least 30 minutes after feeds/meals              Prognosis - 08/27/21 1754       Prognosis   Prognosis for Safe Diet Advancement Guarded    Barriers to Reach Goals Severity of deficits      Individuals Consulted   Report Sent to  Referring physician;Facility (Comment)             Problem List Patient Active Problem List   Diagnosis Date Noted   Left arm cellulitis 08/12/2021   Stage IV pressure ulcer of sacral region (HCC) 08/07/2021   Major depression, recurrent, chronic (HCC) 07/22/2021   Vitamin D deficiency 07/22/2021   Thrombocytopenia (HCC) 07/22/2021   Aortic atherosclerosis (HCC) 07/22/2021   CKD stage 2 due to type 2 diabetes mellitus (HCC) 07/22/2021   Unspecified protein-calorie malnutrition (HCC)  07/17/2021   Adult failure to thrive 07/17/2021   Acute blood loss anemia (ABLA)--post Lt Hip Surgery 06/17/2021   AKI (acute kidney injury) (HCC) 06/15/2021   Pneumonia--?? Aspiration 06/15/2021   Closed fracture of left hip (HCC) 06/12/2021   Pelvic fracture (HCC) 04/11/2021   Type 2 DM with CKD stage 2 and hypertension (HCC) 04/11/2021   Coagulation disorder (HCC) 05/02/2020   GERD (gastroesophageal reflux disease) 02/24/2013   Chronic constipation 02/24/2013   IPF (idiopathic pulmonary fibrosis) (HCC) 02/24/2012   Hypertension associated with type 2 diabetes mellitus (HCC) 11/12/2007   Thank you,  Havery Moros, CCC-SLP [phone]  Twinkle Sockwell 08/27/2021, 5:58 PM  [facility] [address] [place], [place], [address] Phone: [phone]   Fax:  [phone]  Name: MARILLYN GOREN MRN: [id number] Date of Birth: 29-Jun-1927

## 2021-08-28 ENCOUNTER — Non-Acute Institutional Stay (SKILLED_NURSING_FACILITY): Payer: Medicare Other | Admitting: Adult Health

## 2021-08-28 ENCOUNTER — Encounter: Payer: Self-pay | Admitting: Adult Health

## 2021-08-28 DIAGNOSIS — R627 Adult failure to thrive: Secondary | ICD-10-CM | POA: Diagnosis not present

## 2021-08-28 DIAGNOSIS — Z66 Do not resuscitate: Secondary | ICD-10-CM

## 2021-08-28 DIAGNOSIS — S72002G Fracture of unspecified part of neck of left femur, subsequent encounter for closed fracture with delayed healing: Secondary | ICD-10-CM

## 2021-08-28 DIAGNOSIS — L98429 Non-pressure chronic ulcer of back with unspecified severity: Secondary | ICD-10-CM

## 2021-08-28 DIAGNOSIS — J84112 Idiopathic pulmonary fibrosis: Secondary | ICD-10-CM

## 2021-08-28 NOTE — Progress Notes (Signed)
Location:  [facility] Room Number: 140-D Place of Service:  SNF (31)   CODE STATUS: DNR  Allergies  Allergen Reactions   Hydrochlorothiazide Other (See Comments) and Itching    Taken from faxed notes of dr Osborne Casco (to short stay)  THROMBOCYTOPENIA Taken from faxed notes of dr Osborne Casco (to short stay)  THROMBOCYTOPENIA   Prilosec [Omeprazole] Diarrhea    Chief Complaint  Patient presents with   Acute Visit    Status change     HPI:  Her status continues to decline her overall status. Her po intake is very poor; along with her fluid intake. She would benefit from a palliative care consult and possibly transition to hospice care. There are no reports of distress present   Past Medical History:  Diagnosis Date   Diabetes mellitus    Fracture 10/2012   left wrist   Fracture of left clavicle    12/12 wore a sling   GERD (gastroesophageal reflux disease)    Hypertension    Osteoporosis    Pulmonary fibrosis ([place])     Past Surgical History:  Procedure Laterality Date   FEMUR FRACTURE SURGERY      6/08 partial hip replacement right   ganglion cyst removal     left wrist 1960   HIP FRACTURE SURGERY     1984 pin and plate placed right   HIP SURGERY     pin and plate removed 05/6146   INTRAMEDULLARY (IM) NAIL INTERTROCHANTERIC Left 06/14/2021   Procedure: INTRAMEDULLARY (IM) NAIL INTERTROCHANTRIC;  Surgeon: Carole Civil, MD;  Location: [facility];  Service: Orthopedics;  Laterality: Left;   ORIF ELBOW FRACTURE Right 02/18/2013   Procedure: OPEN REDUCTION INTERNAL FIXATION (ORIF) ELBOW/OLECRANON FRACTURE;  Surgeon: Newt Minion, MD;  Location: [place];  Service: Orthopedics;  Laterality: Right;   ORIF ELBOW FRACTURE Left 05/09/2015   Procedure: OPEN REDUCTION INTERNAL FIXATION (ORIF) LEFT MEDIAL HUMERAL CONDYLE FRACTURE;  Surgeon: Leandrew Koyanagi, MD;  Location: [place];  Service: Orthopedics;  Laterality: Left;    Social History   Socioeconomic History    Marital status: Widowed    Spouse name: Not on file   Number of children: Y   Years of education: Not on file   Highest education level: Not on file  Occupational History   Occupation: retired Optometrist  Tobacco Use   Smoking status: Never   Smokeless tobacco: Never  Vaping Use   Vaping Use: Never used  Substance and Sexual Activity   Alcohol use: No   Drug use: No   Sexual activity: Not on file  Other Topics Concern   Not on file  Social History Narrative   Not on file   Social Determinants of Health   Financial Resource Strain: Not on file  Food Insecurity: Not on file  Transportation Needs: Not on file  Physical Activity: Not on file  Stress: Not on file  Social Connections: Not on file  Intimate Partner Violence: Not on file   Family History  Problem Relation Age of Onset   Pancreatic cancer Brother    Stroke Mother    Diabetes Father       VITAL SIGNS BP 113/65   Pulse 98   Temp 98.5 F (36.9 C)   Resp (!) 22   Ht $R'5\' 1"'eB$  (1.549 m)   Wt 99 lb 12.8 oz (45.3 kg)   SpO2 98%   BMI 18.86 kg/m   Outpatient Encounter Medications as of 08/28/2021  Medication Sig   Amino Acids-Protein Hydrolys (FEEDING SUPPLEMENT, PRO-STAT SUGAR FREE 64,) LIQD Take 30 mLs by mouth 3 (three) times daily with meals. To promote wound healing and for poor meal intake   antiseptic oral rinse (BIOTENE) LIQD 15 mLs by Mouth Rinse route every 6 (six) hours as needed for dry mouth.   aspirin EC 81 MG tablet Take 81 mg by mouth daily. Swallow whole.   dronabinol (MARINOL) 2.5 MG capsule Take 1 capsule (2.5 mg total) by mouth daily before lunch.   DULoxetine (CYMBALTA) 20 MG capsule Take 20 mg by mouth daily.   insulin glargine (LANTUS SOLOSTAR) 100 UNIT/ML Solostar Pen Inject 8 Units into the skin at bedtime.   ipratropium-albuterol (DUONEB) 0.5-2.5 (3) MG/3ML SOLN Take 3 mLs by nebulization every 6 (six) hours as needed (for COPD).   lansoprazole (PREVACID) 15 MG capsule Take 15 mg by  mouth daily at 12 noon.   methocarbamol (ROBAXIN) 500 MG tablet Take 500 mg by mouth at bedtime.   mirtazapine (REMERON) 7.5 MG tablet Take 7.5 mg by mouth at bedtime.   ondansetron (ZOFRAN) 4 MG tablet Take 1 tablet (4 mg total) by mouth every 6 (six) hours as needed for nausea.   OXYGEN Inhale 2 L into the lungs continuous. Special Instructions: Document O2 saturation levels every shift; Day, Evening, Night   senna (SENOKOT) 8.6 MG tablet Take 1 tablet by mouth daily.   sertraline (ZOLOFT) 25 MG tablet Take 25 mg by mouth at bedtime.   UNABLE TO FIND Diet:  Dysphagia 2 diet, Nectar thick liquids (CHO). Pt may have Glucerna and use straws.   [DISCONTINUED] acetaminophen (TYLENOL) 500 MG tablet Take 1,000 mg by mouth 3 (three) times daily. 9 am , 2 pm, and 9 pm   [DISCONTINUED] ADVAIR DISKUS 500-50 MCG/DOSE AEPB INHALE 1 PUFF INTO THE LUNGS TWICE DAILY   [DISCONTINUED] amLODipine (NORVASC) 2.5 MG tablet Take 1 tablet (2.5 mg total) by mouth daily.   [DISCONTINUED] D-5000 125 MCG (5000 UT) TABS Take 125 mcg by mouth daily.   [DISCONTINUED] insulin lispro (HUMALOG) 100 UNIT/ML injection Inject 5 Units into the skin 3 (three) times daily before meals. 7 am, 11:30 am, and 6 pm, Special Instructions: GIVE FOR CBG > 150. PRIME WITH 2 UNITS BEFORE EACH INJECTION. AFTER EACH INJECTION, KEEP NEEDLE IN SKIN FOR AT LEAST 5 SECONDS AFTER THE COUNTER SHOWS ZERO   [DISCONTINUED] Lidocaine 4 % PTCH Apply topically. apply to left hip daily and remove at HS. [DX: Displaced intertrochanteric fracture of left femur, subsequent encounter for closed fracture with routine healing]   No facility-administered encounter medications on file as of 08/28/2021.     SIGNIFICANT DIAGNOSTIC EXAMS   LABS REVIEWED PREVIOUS   06-12-21: wbc 7.6; hgb 14.6; hct 45.3; mcv 97.0 plt 108; glucose 276; bun 16; creat 0.92; k+ 4.2; na++ 135; ca 9.3; GFR 58; hgb a1c 7.3; vit D 42.29 06-15-21: wbc 19.7; hgb 8.0; hct 25.5; mcv 103.2 plt  100; glucose 238; bun 48; creat 1.49; k+ 4.8; na++ 139; ca 8.2 GFR 33; AM cortisol 28.8 07-16-21: wbc 8.1; hgb 9.7; hct 30.7; mcv 95.6 plt 170; glucose 203; bun 15; creat 0.49; k+ 3.6; na++ 131; ca 8.5 GFR >60; alk phos 187; albumin 2.2 hgb a1c 6.7   NO NEW LABS.   Review of Systems  Unable to perform ROS: Medical condition   Physical Exam Constitutional:      General: She is not in acute distress.    Appearance: She  is cachectic. She is not diaphoretic.  Neck:     Thyroid: No thyromegaly.  Cardiovascular:     Rate and Rhythm: Normal rate and regular rhythm.     Pulses: Normal pulses.     Heart sounds: Normal heart sounds.  Pulmonary:     Effort: Pulmonary effort is normal. No respiratory distress.     Breath sounds: Normal breath sounds.  Abdominal:     General: Bowel sounds are normal. There is no distension.     Palpations: Abdomen is soft.     Tenderness: There is no abdominal tenderness.  Musculoskeletal:     Cervical back: Neck supple.     Right lower leg: No edema.     Left lower leg: No edema.     Comments: Kyphosis  Able to move all extremities Status post IM Nail left hip 06-14-21   Lymphadenopathy:     Cervical: No cervical adenopathy.  Skin:    General: Skin is warm and dry.     Comments:  Stage 4 sacral ulceration: 1.6 x 1.0 x 0.2 with undermining 1.4 cm. No indications of infection present  Neurological:     Mental Status: She is alert. Mental status is at baseline.  Psychiatric:        Mood and Affect: Mood normal.      ASSESSMENT/ PLAN:  TODAY  Failure to thrive in adult Closed left hip with delayed healing subsequent encounter Stage 4 skin ulcer of sacral region IPF (idiopathic pulmonary fibrosis)   Will stop the following medications: novolog; lisinopril; advair; lidoderm patch; vit D and MVI Will setup a palliative care consult Will begin duoneb every 6 hours    Ok Edwards NP [facility]  Contact [phone] Monday through  Friday 8am- 5pm  After hours call [phone]

## 2021-08-29 DIAGNOSIS — S72142D Displaced intertrochanteric fracture of left femur, subsequent encounter for closed fracture with routine healing: Secondary | ICD-10-CM | POA: Diagnosis not present

## 2021-08-29 DIAGNOSIS — R41841 Cognitive communication deficit: Secondary | ICD-10-CM | POA: Diagnosis not present

## 2021-08-29 DIAGNOSIS — R1312 Dysphagia, oropharyngeal phase: Secondary | ICD-10-CM | POA: Diagnosis not present

## 2021-08-29 DIAGNOSIS — Z9181 History of falling: Secondary | ICD-10-CM | POA: Diagnosis not present

## 2021-08-29 DIAGNOSIS — M6281 Muscle weakness (generalized): Secondary | ICD-10-CM | POA: Diagnosis not present

## 2021-08-29 DIAGNOSIS — R279 Unspecified lack of coordination: Secondary | ICD-10-CM | POA: Diagnosis not present

## 2021-08-29 DIAGNOSIS — Z4789 Encounter for other orthopedic aftercare: Secondary | ICD-10-CM | POA: Diagnosis not present

## 2021-09-02 ENCOUNTER — Encounter: Payer: Self-pay | Admitting: Internal Medicine

## 2021-09-02 ENCOUNTER — Other Ambulatory Visit (HOSPITAL_COMMUNITY)
Admission: RE | Admit: 2021-09-02 | Discharge: 2021-09-02 | Disposition: A | Discharge status: Home / Self Care | Payer: Medicare Other | Source: Skilled Nursing Facility | Attending: Adult Health | Admitting: Adult Health

## 2021-09-02 ENCOUNTER — Non-Acute Institutional Stay (SKILLED_NURSING_FACILITY): Payer: Medicare Other | Admitting: Internal Medicine

## 2021-09-02 DIAGNOSIS — R627 Adult failure to thrive: Secondary | ICD-10-CM

## 2021-09-02 DIAGNOSIS — R7989 Other specified abnormal findings of blood chemistry: Secondary | ICD-10-CM | POA: Insufficient documentation

## 2021-09-02 DIAGNOSIS — I129 Hypertensive chronic kidney disease with stage 1 through stage 4 chronic kidney disease, or unspecified chronic kidney disease: Secondary | ICD-10-CM | POA: Diagnosis not present

## 2021-09-02 DIAGNOSIS — N182 Chronic kidney disease, stage 2 (mild): Secondary | ICD-10-CM | POA: Insufficient documentation

## 2021-09-02 DIAGNOSIS — L98429 Non-pressure chronic ulcer of back with unspecified severity: Secondary | ICD-10-CM | POA: Insufficient documentation

## 2021-09-02 DIAGNOSIS — E1122 Type 2 diabetes mellitus with diabetic chronic kidney disease: Secondary | ICD-10-CM | POA: Insufficient documentation

## 2021-09-02 DIAGNOSIS — J84112 Idiopathic pulmonary fibrosis: Secondary | ICD-10-CM | POA: Diagnosis present

## 2021-09-02 DIAGNOSIS — D62 Acute posthemorrhagic anemia: Secondary | ICD-10-CM | POA: Diagnosis not present

## 2021-09-02 LAB — COMPREHENSIVE METABOLIC PANEL
ALT: 45 U/L — ABNORMAL HIGH (ref 0–44)
AST: 72 U/L — ABNORMAL HIGH (ref 15–41)
Albumin: 2.2 g/dL — ABNORMAL LOW (ref 3.5–5.0)
Alkaline Phosphatase: 636 U/L — ABNORMAL HIGH (ref 38–126)
Anion gap: 7 (ref 5–15)
BUN: 31 mg/dL — ABNORMAL HIGH (ref 8–23)
CO2: 29 mmol/L (ref 22–32)
Calcium: 10.1 mg/dL (ref 8.9–10.3)
Chloride: 124 mmol/L — ABNORMAL HIGH (ref 98–111)
Creatinine, Ser: 0.73 mg/dL (ref 0.44–1.00)
GFR, Estimated: >60 mL/min (ref >60)
Glucose, Bld: 175 mg/dL — ABNORMAL HIGH (ref 70–99)
Potassium: 3.2 mmol/L — ABNORMAL LOW (ref 3.5–5.1)
Sodium: 160 mmol/L — ABNORMAL HIGH (ref 135–145)
Total Bilirubin: 2.9 mg/dL — ABNORMAL HIGH (ref 0.3–1.2)
Total Protein: 6.4 g/dL — ABNORMAL LOW (ref 6.5–8.1)

## 2021-09-02 LAB — TSH: TSH: 2.19 u[IU]/mL (ref 0.350–4.500)

## 2021-09-02 NOTE — Patient Instructions (Signed)
See assessment and plan under each diagnosis in the problem list and acutely for this visit 

## 2021-09-02 NOTE — Assessment & Plan Note (Signed)
Comfort care designation is appropriate in view of her multiple advanced comorbidities and terminal  prognosis.

## 2021-09-02 NOTE — Progress Notes (Signed)
   NURSING HOME LOCATION: [facility] ROOM NUMBER:  140 P CODE STATUS:  DNR PCP:  Mitchel Honour MD  This is a nursing facility follow up visit of chronic medical diagnoses & to document compliance with Regulation 483.30 (c) in The [place] Manual Phase 2 which mandates caregiver visit ( visits can alternate among physician, PA or NP as per statutes) within 10 days of 30 days / 60 days/ 90 days post admission to SNF date    Interim medical record and care since last SNF visit was updated with review of diagnostic studies and change in clinical status since last visit were documented.  HPI: She exhibits documented findings of progressive failure to thrive and is receiving comfort care.  She had been transferred to this facility from another SNF where she was in AL without PT/OT services  family was seeking. Placement at that facility occurred after she been hospitalized 7/13 - 06/17/2021 for closed 2 part intertrochanteric fracture of the left femur ,sustained in a fall from a chair. Her hospital course was complicated by AKI , possible aspiration pneumonia as well as acute blood loss anemia postoperatively.  Medical diagnoses include GERD, essential hypertension, osteoporosis, pulmonary fibrosis, and diabetes complicated by CKD stage II.  Course now has been complicated by increased transaminases and elevation of total bilirubin.  AST was 72 , AST 45 ,alk phos 636  & total bilirubin 2.9.  Albumin is markedly reduced at 2.2; total protein mildly reduced @ 6.4.  She has mild hypokalemia with a potassium of 3.2.  Creatinine is 0.73 which may reflect the decreased albumin & muscle wasting; but GFR is stable @ 60 indicating CKD stage II.  Her anemia has improved with H/H values of 9.7/30.7.  Diabetes is adequately controlled with an A1c of 6.7%.  Review of systems could not be completed as she did not wake up during the exam.  Physical exam:  Pertinent or positive findings:  As noted she slept through the exam.  She appears grossly cachectic.  She is wearing oxygen.  She exhibits a constant tremor of the mandible, right upper extremity, right lower extremity.  Resting tachycardia is present.  Breath sounds are decreased.  Pedal pulses are not palpable. When her feet were palpated; toes were upgoing.  She has limb atrophy as well as interosseous wasting.  General appearance: no acute distress, increased work of breathing is present.   Lymphatic: No lymphadenopathy about the head, neck, axilla. Eyes: No conjunctival inflammation or lid edema is present. There is no scleral icterus. Ears:  External ear exam shows no significant lesions or deformities.   Nose:  External nasal examination shows no deformity or inflammation. Nasal mucosa are pink and moist without lesions, exudates Neck:  No thyromegaly, masses, tenderness noted.    Heart:  No murmur, click, rub .  Lungs:  without wheezes, rhonchi, rales, rubs. Abdomen: Bowel sounds are normal. Abdomen is soft and nontender with no organomegaly, hernias, masses. GU: Deferred  Extremities:  No cyanosis, clubbing, edema  Skin: Warm & dry w/o tenting. No significant lesions or rash visualized, but sacral wound reported.  See summary under each active problem in the Problem List with associated updated therapeutic plan

## 2021-09-02 NOTE — Assessment & Plan Note (Addendum)
Diabetes adequately controlled as documented by an A1c of 6.7%.  CKD is stable with GFR greater than 60.

## 2021-09-02 NOTE — Assessment & Plan Note (Signed)
No hepatotoxic agents present on review of medicine list. Continue comfort care measures.

## 2021-09-02 NOTE — Assessment & Plan Note (Addendum)
Anemia has improved; current H/H values are 9.7/30.7.  No acute or active bleeding dyscrasias reported by staff.

## 2021-09-03 ENCOUNTER — Other Ambulatory Visit: Payer: Self-pay | Admitting: Adult Health

## 2021-09-03 ENCOUNTER — Non-Acute Institutional Stay (SKILLED_NURSING_FACILITY): Payer: Medicare Other | Admitting: Adult Health

## 2021-09-03 ENCOUNTER — Encounter: Payer: Self-pay | Admitting: Adult Health

## 2021-09-03 DIAGNOSIS — R627 Adult failure to thrive: Secondary | ICD-10-CM | POA: Diagnosis not present

## 2021-09-03 DIAGNOSIS — L89154 Pressure ulcer of sacral region, stage 4: Secondary | ICD-10-CM | POA: Diagnosis not present

## 2021-09-03 DIAGNOSIS — S72002G Fracture of unspecified part of neck of left femur, subsequent encounter for closed fracture with delayed healing: Secondary | ICD-10-CM

## 2021-09-03 MED ORDER — MORPHINE SULFATE (CONCENTRATE) 20 MG/ML PO SOLN: 5.0000 mg | ORAL | 0 refills | Status: AC | PRN

## 2021-09-03 NOTE — Progress Notes (Signed)
Location:  [place] Room Number: 140 Place of Service:  SNF (31)   CODE STATUS: dnr  Allergies  Allergen Reactions   Hydrochlorothiazide Other (See Comments) and Itching    Taken from faxed notes of dr Osborne Casco (to short stay)  THROMBOCYTOPENIA Taken from faxed notes of dr Osborne Casco (to short stay)  THROMBOCYTOPENIA   Prilosec [Omeprazole] Diarrhea    Chief Complaint  Patient presents with   Acute Visit    End of life     HPI:  The focus of her care is comfort. She is at end of life. She is having periods of being uncomfortable; and appears to be in pain. Her family desires for her to maintain comfort only.   Past Medical History:  Diagnosis Date   Diabetes mellitus    Fracture 10/2012   left wrist   Fracture of left clavicle    12/12 wore a sling   GERD (gastroesophageal reflux disease)    Hypertension    Osteoporosis    Pulmonary fibrosis ([place])     Past Surgical History:  Procedure Laterality Date   FEMUR FRACTURE SURGERY      6/08 partial hip replacement right   ganglion cyst removal     left wrist 1960   HIP FRACTURE SURGERY     1984 pin and plate placed right   HIP SURGERY     pin and plate removed 05/9149   INTRAMEDULLARY (IM) NAIL INTERTROCHANTERIC Left 06/14/2021   Procedure: INTRAMEDULLARY (IM) NAIL INTERTROCHANTRIC;  Surgeon: Carole Civil, MD;  Location: [facility];  Service: Orthopedics;  Laterality: Left;   ORIF ELBOW FRACTURE Right 02/18/2013   Procedure: OPEN REDUCTION INTERNAL FIXATION (ORIF) ELBOW/OLECRANON FRACTURE;  Surgeon: Newt Minion, MD;  Location: [place];  Service: Orthopedics;  Laterality: Right;   ORIF ELBOW FRACTURE Left 05/09/2015   Procedure: OPEN REDUCTION INTERNAL FIXATION (ORIF) LEFT MEDIAL HUMERAL CONDYLE FRACTURE;  Surgeon: Leandrew Koyanagi, MD;  Location: [place];  Service: Orthopedics;  Laterality: Left;    Social History   Socioeconomic History   Marital status: Widowed    Spouse name: Not on file    Number of children: Y   Years of education: Not on file   Highest education level: Not on file  Occupational History   Occupation: retired Optometrist  Tobacco Use   Smoking status: Never   Smokeless tobacco: Never  Vaping Use   Vaping Use: Never used  Substance and Sexual Activity   Alcohol use: No   Drug use: No   Sexual activity: Not on file  Other Topics Concern   Not on file  Social History Narrative   Not on file   Social Determinants of Health   Financial Resource Strain: Not on file  Food Insecurity: Not on file  Transportation Needs: Not on file  Physical Activity: Not on file  Stress: Not on file  Social Connections: Not on file  Intimate Partner Violence: Not on file   Family History  Problem Relation Age of Onset   Pancreatic cancer Brother    Stroke Mother    Diabetes Father       VITAL SIGNS BP 130/70   Pulse 98   Temp 98 F (36.7 C)   Ht $R'5\' 1"'hD$  (1.549 m)   Wt 99 lb 12.8 oz (45.3 kg)   BMI 18.86 kg/m   Outpatient Encounter Medications as of 09/03/2021  Medication Sig   antiseptic oral rinse (BIOTENE) LIQD 15 mLs by  Mouth Rinse route every 6 (six) hours as needed for dry mouth.   dronabinol (MARINOL) 2.5 MG capsule Take 1 capsule (2.5 mg total) by mouth daily before lunch.   DULoxetine (CYMBALTA) 20 MG capsule Take 20 mg by mouth daily.   insulin glargine (LANTUS SOLOSTAR) 100 UNIT/ML Solostar Pen Inject 8 Units into the skin at bedtime.   ipratropium-albuterol (DUONEB) 0.5-2.5 (3) MG/3ML SOLN Take 3 mLs by nebulization every 6 (six) hours as needed (for COPD).   lansoprazole (PREVACID) 15 MG capsule Take 15 mg by mouth daily at 12 noon.   methocarbamol (ROBAXIN) 500 MG tablet Take 500 mg by mouth at bedtime.   mirtazapine (REMERON) 7.5 MG tablet Take 7.5 mg by mouth at bedtime.   morphine (ROXANOL) 20 MG/ML concentrated solution Take 0.25 mLs (5 mg total) by mouth every 2 (two) hours as needed for severe pain.   ondansetron (ZOFRAN) 4 MG tablet  Take 1 tablet (4 mg total) by mouth every 6 (six) hours as needed for nausea.   OXYGEN Inhale 2 L into the lungs continuous. Special Instructions: Document O2 saturation levels every shift; Day, Evening, Night   senna (SENOKOT) 8.6 MG tablet Take 1 tablet by mouth daily.   sertraline (ZOLOFT) 25 MG tablet Take 25 mg by mouth at bedtime.   UNABLE TO FIND Diet:  Dysphagia 2 diet, Nectar thick liquids (CHO). Pt may have Glucerna and use straws.   No facility-administered encounter medications on file as of 09/03/2021.     SIGNIFICANT DIAGNOSTIC EXAMS   LABS REVIEWED PREVIOUS   06-12-21: wbc 7.6; hgb 14.6; hct 45.3; mcv 97.0 plt 108; glucose 276; bun 16; creat 0.92; k+ 4.2; na++ 135; ca 9.3; GFR 58; hgb a1c 7.3; vit D 42.29 06-15-21: wbc 19.7; hgb 8.0; hct 25.5; mcv 103.2 plt 100; glucose 238; bun 48; creat 1.49; k+ 4.8; na++ 139; ca 8.2 GFR 33; AM cortisol 28.8 07-16-21: wbc 8.1; hgb 9.7; hct 30.7; mcv 95.6 plt 170; glucose 203; bun 15; creat 0.49; k+ 3.6; na++ 131; ca 8.5 GFR >60; alk phos 187; albumin 2.2 hgb a1c 6.7   TODAY  09-02-21: glucose 175; bun 31; creat 0.73; k+ 3.2; na++ 160; ca 10.1; GFR>60; ast 72; alt 45; alk phos 626; total bili 2.9; albumin 2.2   Review of Systems  Reason unable to perform ROS: lethargy.   Physical Exam Constitutional:      General: She is not in acute distress.    Appearance: She is cachectic. She is not diaphoretic.  Neck:     Thyroid: No thyromegaly.  Cardiovascular:     Rate and Rhythm: Normal rate and regular rhythm.     Pulses: Normal pulses.     Heart sounds: Normal heart sounds.  Pulmonary:     Effort: Pulmonary effort is normal. No respiratory distress.     Breath sounds: Normal breath sounds.  Abdominal:     General: Bowel sounds are normal. There is no distension.     Palpations: Abdomen is soft.     Tenderness: There is no abdominal tenderness.  Musculoskeletal:     Cervical back: Neck supple.     Right lower leg: No edema.     Left  lower leg: No edema.     Comments: Kyphosis  Status post IM Nail left hip 06-14-21    Lymphadenopathy:     Cervical: No cervical adenopathy.  Skin:    General: Skin is warm and dry.     Comments: Stage 4 sacral  ulceration: 1.6 x 1.0 x 0.2 with undermining 1.4 cm. No indications of infection present       ASSESSMENT/ PLAN:  TODAY  Adult failure to thrive Closed fracture of left hip with delayed healing subsequent encounter Stage IV pressure ulcer of sacral region  Will stop all medications Will begin roxanol 5 mg every 2 hours as needed for pain or distress    Ok Edwards NP [facility]  Contact [phone] Monday through Friday 8am- 5pm  After hours call [phone]

## 2021-09-09 ENCOUNTER — Encounter: Payer: Medicare Other | Admitting: Orthopedic Surgery

## 2021-10-01 DEATH — deceased

## 2022-05-18 IMAGING — CT CT ABD-PELV W/O CM
1 of 2 series · 14 of 32 positions shown, 19 images · non-contrast
Comparison: February 17, 2013.

CLINICAL DATA: Acute right flank pain, gross hematuria.

EXAM:
CT ABDOMEN AND PELVIS WITHOUT CONTRAST
TECHNIQUE: Multidetector CT imaging of the abdomen and pelvis was performed
following the standard protocol without IV contrast.

[Series 2: renal standard/full · axial · 0.68mm/px · z∈[-358,+2]mm · 14 of 80 slices shown, 19 images]
[im 4/80  soft-tissue]
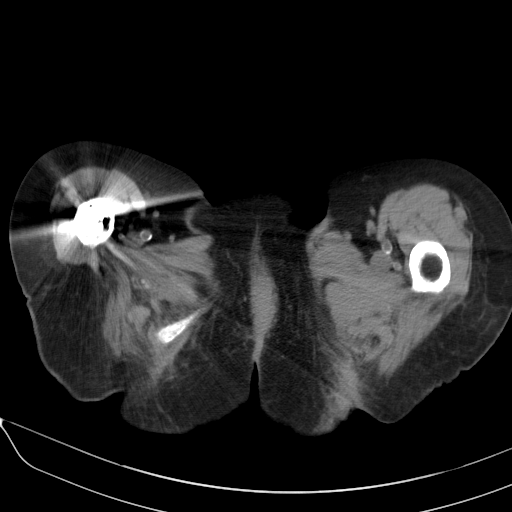
[im 4/80  bone]
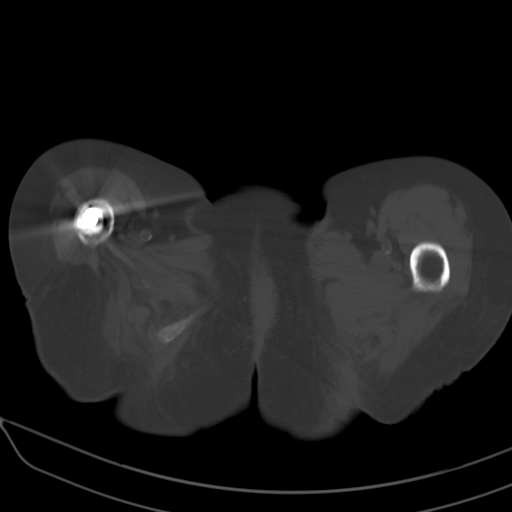
[im 12/80  soft-tissue]
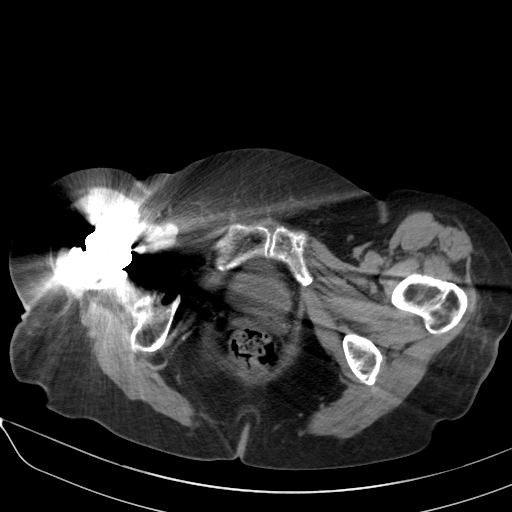
[im 16/80  soft-tissue]
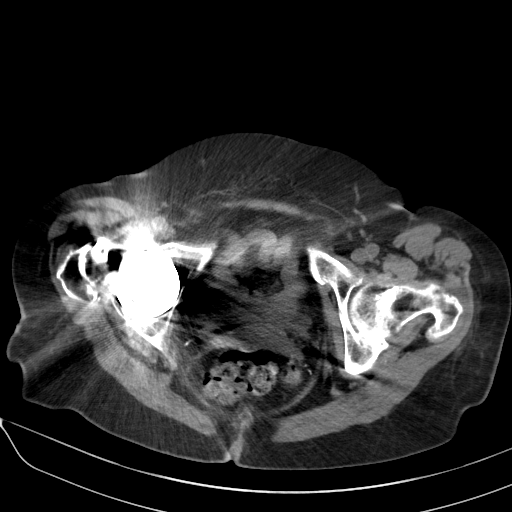
[im 24/80  soft-tissue]
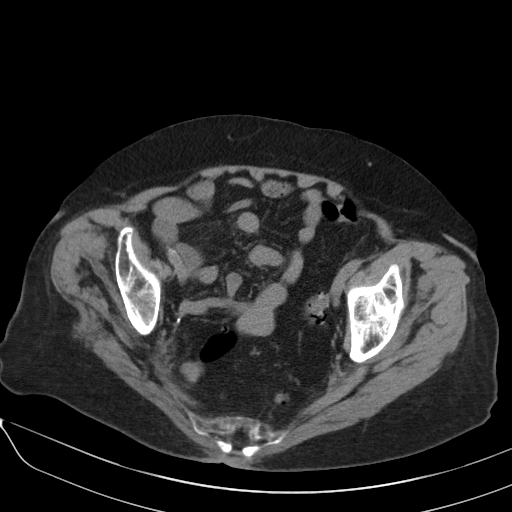
[im 28/80  soft-tissue]
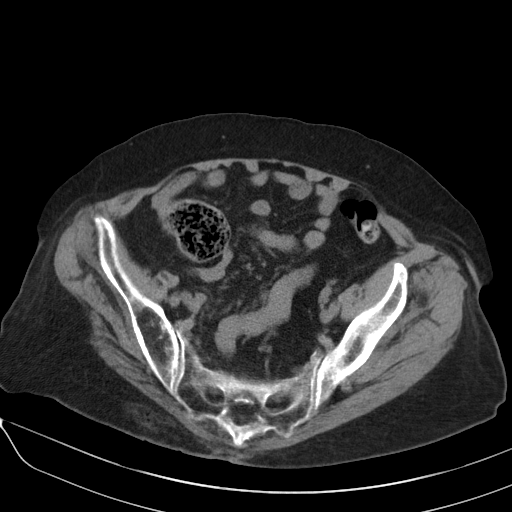
[im 36/80  soft-tissue]
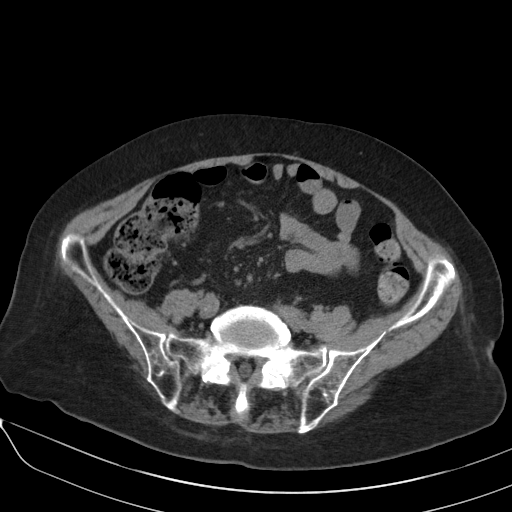
[im 40/80  soft-tissue]
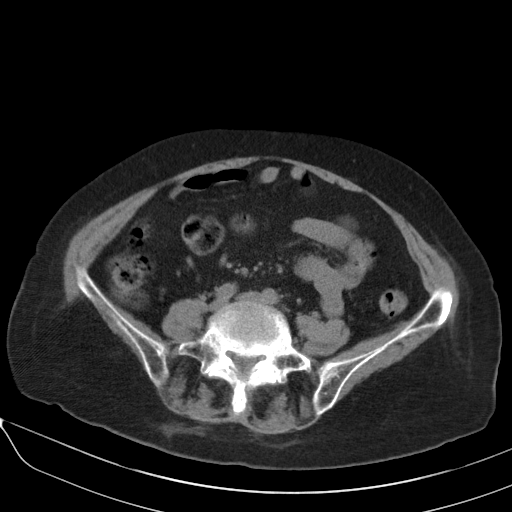
[im 44/80  soft-tissue]
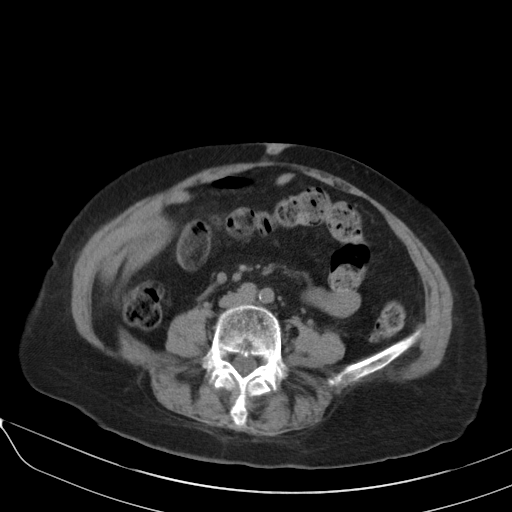
[im 52/80  soft-tissue]
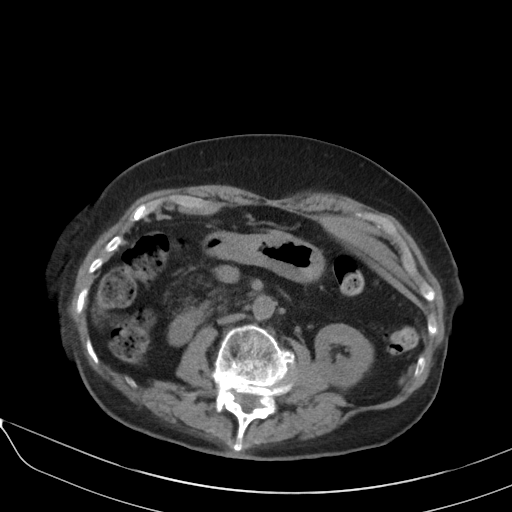
[im 52/80  bone]
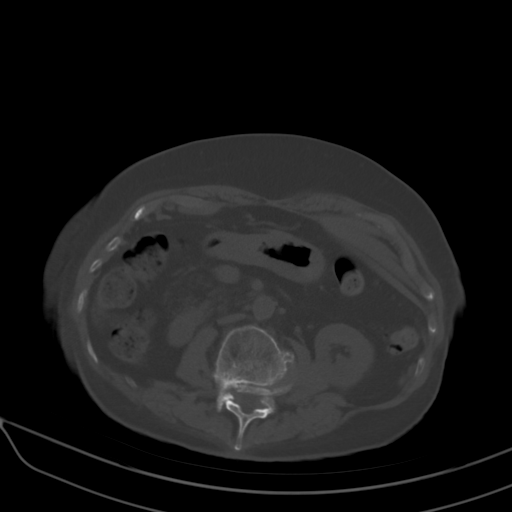
[im 56/80  soft-tissue]
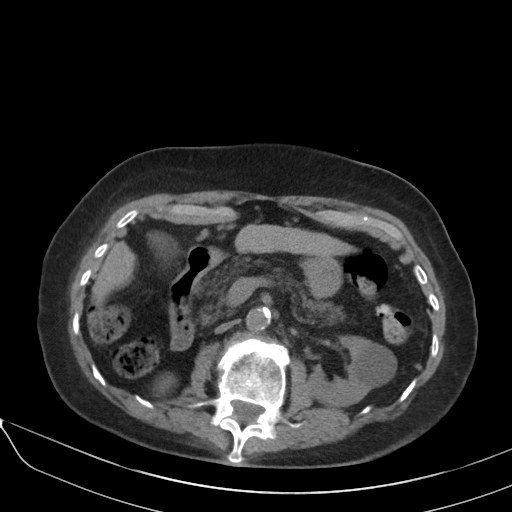
[im 64/80  soft-tissue]
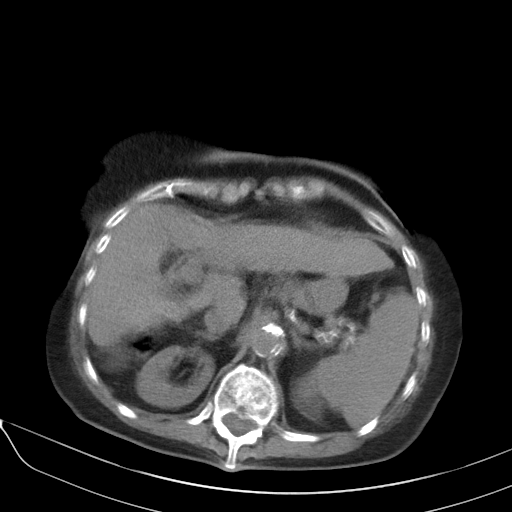
[im 64/80  lung]
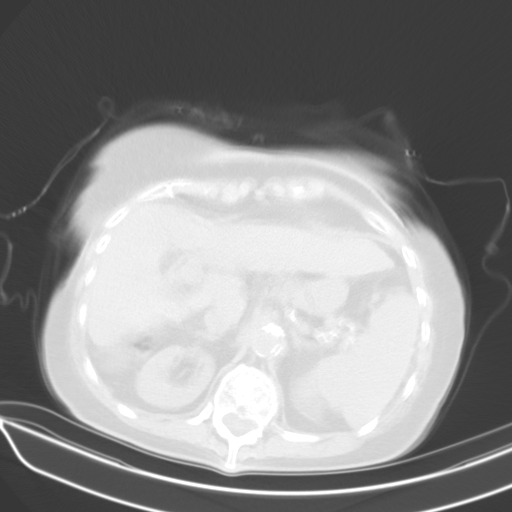
[im 68/80  soft-tissue]
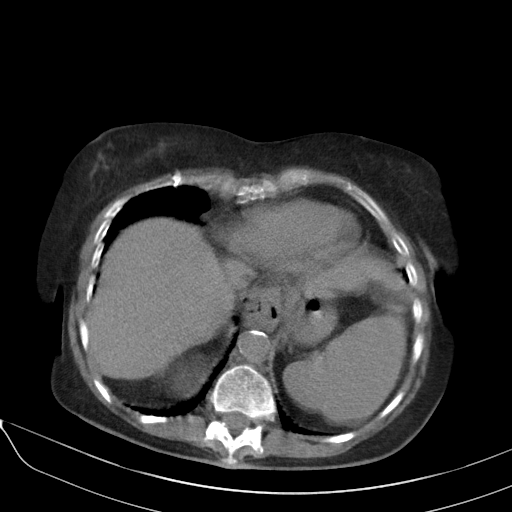
[im 68/80  lung]
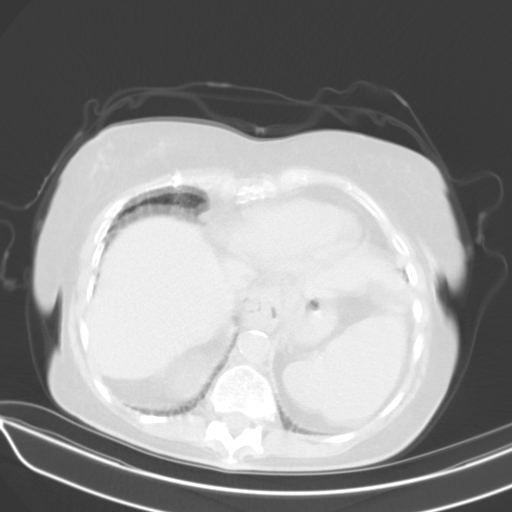
[im 72/80  lung]
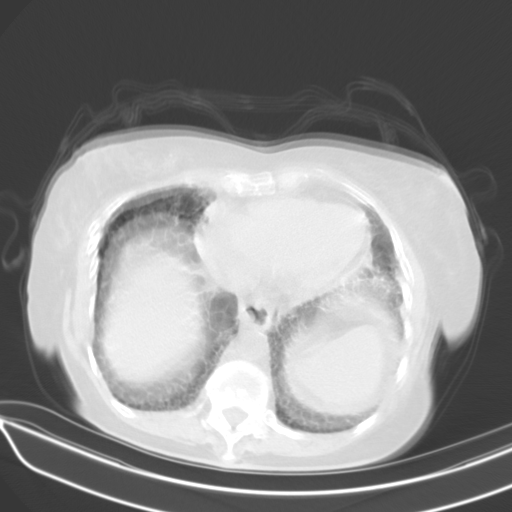
[im 76/80  soft-tissue]
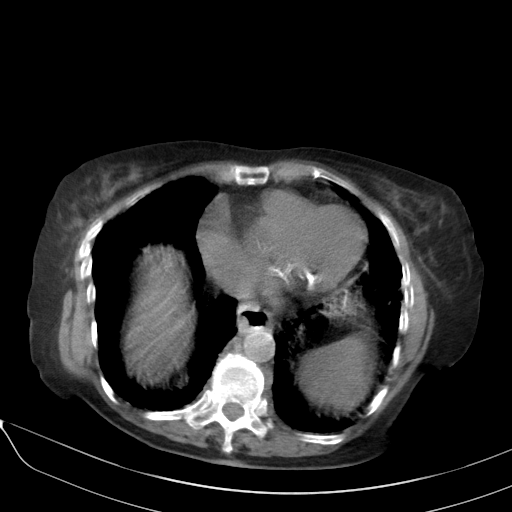
[im 76/80  lung]
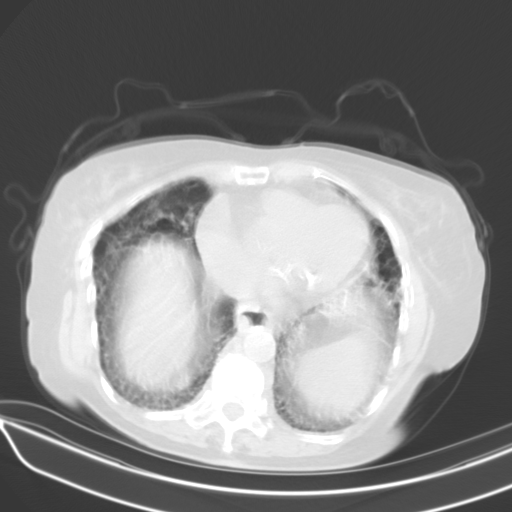

[14 of 32 positions shown; findings below may reference images not displayed]

FINDINGS: Lower chest: Images of the lungs are limited secondary to persistent
patient motion artifact. Reticular densities are noted most
consistent with scarring.

Hepatobiliary: Cholelithiasis is noted. No biliary dilatation is
noted. The liver is unremarkable on these unenhanced images.

Pancreas: Fatty replacement of a large portion of the pancreas is
noted. No acute inflammation or ductal dilatation is noted.

Spleen: Normal in size without focal abnormality.

Adrenals/Urinary Tract: Adrenal glands appear normal. Left renal
cyst is noted. No hydronephrosis or renal obstruction is noted. No
renal or ureteral calculi are noted. Urinary bladder is
decompressed.

Stomach/Bowel: The stomach appears normal. There is no evidence of
bowel obstruction or inflammation. Sigmoid diverticulosis is noted
without inflammation.

Vascular/Lymphatic: Aortic atherosclerosis. No enlarged abdominal or
pelvic lymph nodes.

Reproductive: Uterus and bilateral adnexa are unremarkable.

Other: No abdominal wall hernia or abnormality. No abdominopelvic
ascites.

Musculoskeletal: Status post right total hip arthroplasty. No acute
osseous abnormality is noted. Multiple old compression fractures are
seen involving the visualized thoracic and lumbar spine.
IMPRESSION: 1. Cholelithiasis.
2. Sigmoid diverticulosis without inflammation.
3. No hydronephrosis or renal obstruction is noted.
4. No renal or ureteral calculi are noted.
5. Aortic atherosclerosis.

Aortic Atherosclerosis ([id number]).
